# Patient Record
Sex: Male | Born: 1945
Health system: Southern US, Community
[De-identification: ages and names within clinical notes are randomized; demographics above are authoritative.]

## PROBLEM LIST (undated history)

## (undated) DIAGNOSIS — E785 Hyperlipidemia, unspecified: Secondary | ICD-10-CM

## (undated) DIAGNOSIS — Z923 Personal history of irradiation: Secondary | ICD-10-CM

## (undated) DIAGNOSIS — Z860100 Personal history of colon polyps, unspecified: Secondary | ICD-10-CM

## (undated) DIAGNOSIS — I2510 Atherosclerotic heart disease of native coronary artery without angina pectoris: Secondary | ICD-10-CM

## (undated) DIAGNOSIS — C4490 Unspecified malignant neoplasm of skin, unspecified: Secondary | ICD-10-CM

## (undated) DIAGNOSIS — E669 Obesity, unspecified: Secondary | ICD-10-CM

## (undated) DIAGNOSIS — I4510 Unspecified right bundle-branch block: Secondary | ICD-10-CM

## (undated) DIAGNOSIS — C61 Malignant neoplasm of prostate: Secondary | ICD-10-CM

## (undated) DIAGNOSIS — G629 Polyneuropathy, unspecified: Secondary | ICD-10-CM

## (undated) DIAGNOSIS — E113299 Type 2 diabetes mellitus with mild nonproliferative diabetic retinopathy without macular edema, unspecified eye: Secondary | ICD-10-CM

## (undated) DIAGNOSIS — J189 Pneumonia, unspecified organism: Secondary | ICD-10-CM

## (undated) DIAGNOSIS — C801 Malignant (primary) neoplasm, unspecified: Secondary | ICD-10-CM

## (undated) DIAGNOSIS — D649 Anemia, unspecified: Secondary | ICD-10-CM

## (undated) DIAGNOSIS — K5730 Diverticulosis of large intestine without perforation or abscess without bleeding: Secondary | ICD-10-CM

## (undated) DIAGNOSIS — I219 Acute myocardial infarction, unspecified: Secondary | ICD-10-CM

## (undated) DIAGNOSIS — I10 Essential (primary) hypertension: Secondary | ICD-10-CM

## (undated) DIAGNOSIS — E119 Type 2 diabetes mellitus without complications: Secondary | ICD-10-CM

## (undated) HISTORY — DX: Malignant neoplasm of prostate: C61

## (undated) HISTORY — PX: APPENDECTOMY: SHX54

## (undated) HISTORY — DX: Unspecified malignant neoplasm of skin, unspecified: C44.90

## (undated) HISTORY — PX: PARTIAL COLECTOMY: SHX5273

## (undated) HISTORY — DX: Acute myocardial infarction, unspecified: I21.9

## (undated) HISTORY — PX: CORONARY ANGIOPLASTY WITH STENT PLACEMENT: SHX49

## (undated) HISTORY — DX: Malignant (primary) neoplasm, unspecified: C80.1

## (undated) HISTORY — DX: Hyperlipidemia, unspecified: E78.5

## (undated) HISTORY — DX: Essential (primary) hypertension: I10

## (undated) HISTORY — PX: CARDIOVASCULAR STRESS TEST: SHX262

## (undated) HISTORY — DX: Obesity, unspecified: E66.9

---

## 2004-02-19 ENCOUNTER — Ambulatory Visit: Payer: Self-pay | Admitting: Internal Medicine

## 2004-03-11 ENCOUNTER — Ambulatory Visit: Payer: Self-pay | Admitting: Internal Medicine

## 2004-07-09 ENCOUNTER — Ambulatory Visit: Payer: Self-pay | Admitting: Internal Medicine

## 2004-07-16 ENCOUNTER — Ambulatory Visit: Payer: Self-pay | Admitting: Internal Medicine

## 2006-04-28 ENCOUNTER — Inpatient Hospital Stay (HOSPITAL_BASED_OUTPATIENT_CLINIC_OR_DEPARTMENT_OTHER): Admission: RE | Admit: 2006-04-28 | Discharge: 2006-04-28 | Payer: Self-pay | Admitting: Cardiology

## 2006-05-04 ENCOUNTER — Inpatient Hospital Stay (HOSPITAL_COMMUNITY): Admission: AD | Admit: 2006-05-04 | Discharge: 2006-05-05 | Payer: Self-pay | Admitting: Pediatrics

## 2006-05-26 ENCOUNTER — Encounter (HOSPITAL_COMMUNITY): Admission: RE | Admit: 2006-05-26 | Discharge: 2006-08-15 | Payer: Self-pay | Admitting: Cardiology

## 2009-05-12 ENCOUNTER — Encounter: Admission: RE | Admit: 2009-05-12 | Discharge: 2009-05-12 | Payer: Self-pay | Admitting: *Deleted

## 2009-05-23 ENCOUNTER — Encounter: Admission: RE | Admit: 2009-05-23 | Discharge: 2009-05-23 | Payer: Self-pay | Admitting: *Deleted

## 2009-09-23 ENCOUNTER — Ambulatory Visit: Payer: Self-pay | Admitting: Cardiology

## 2010-03-30 ENCOUNTER — Ambulatory Visit: Payer: Self-pay | Admitting: Cardiology

## 2010-06-08 ENCOUNTER — Other Ambulatory Visit: Payer: Self-pay | Admitting: Family Medicine

## 2010-06-08 ENCOUNTER — Ambulatory Visit
Admission: RE | Admit: 2010-06-08 | Discharge: 2010-06-08 | Disposition: A | Discharge status: Home / Self Care | Payer: Medicare Other | Source: Ambulatory Visit | Attending: Family Medicine | Admitting: Family Medicine

## 2010-06-08 DIAGNOSIS — R509 Fever, unspecified: Secondary | ICD-10-CM

## 2010-06-25 ENCOUNTER — Other Ambulatory Visit: Payer: Self-pay | Admitting: Cardiology

## 2010-06-25 NOTE — Telephone Encounter (Signed)
escribe medication per fax request  

## 2010-06-26 NOTE — Discharge Summary (Signed)
NAME:  FIELDS, OROS NO.:  [id number]   MEDICAL RECORD NO.:  [id number]          PATIENT TYPE:  INP   LOCATION:  6523                         FACILITY:  [facility]   PHYSICIAN:  [name]. Swaziland, M.D.  DATE OF BIRTH:  10-14-1945   DATE OF ADMISSION:  05/04/2006  DATE OF DISCHARGE:  05/05/2006                               DISCHARGE SUMMARY   HISTORY OF PRESENT ILLNESS:  Benjamin Barr is a 65 year old white male who  presented with _______.  He had an abnormal stress Cardiolite study who  subsequently underwent cardiac catheterization.  This demonstrated  severe 2 vessel coronary disease.  He had an 80% to 90% stenosis left  circumflex coronary artery bifurcation versus marginal vessel.  There  was also an 80% stenosis of the mid right coronary artery.  The patient  returned for percutaneous intervention of these lesions.  Further  details in the past medical history, social history, family history, and  physical examination.   LABORATORY DATA:  Refer to his dictated H&P.   HOSPITAL COURSE:  The patient was brought to the catheterization  laboratory.  He underwent a 2 vessel stenting.  He had a 2.5 x 24 mm  Taxus stent placed in the left circumflex, coronary artery extending  wrapped the proximal vessel and across the bifurcation of the first  marginal vessel.  An excellent angiograph result was obtained 0%  residual stenosis.  There was no compromise in the first marginal side  branch.  We also stented the mid right coronary artery using a 3.0 x 24  mm Taxus stent.  Again, an excellent angiograph result was obtained, it  was 0% residual stenosis.  The patient had no complications post  procedure.  His chemistries and CBC remained stable.  He had no growing  hematomas.  EKG was unchanged.  He had no chest pain.  He was maintained  on aspirin and Plavix.  He was discharged home the following day in  stable condition.   DISCHARGE DIAGNOSES:  1. Coronary artery disease,  significant 2 vessel disease now status      post successful stenting.  2. Diabetes mellitus type 2.  3. Hyperlipidemia.  4. Hypertension.  5. Obesity.   DISCHARGE MEDICATIONS:  1. Aspirin 325 mg daily for a month, then 81 mg per day.  2. Plavix 75 mg per day.  3. Glipizide/metformin 5/500 mg two tablets twice a day.  The patient      will need to hold this for 2 days given his diet load.  4. Lisinopril HCT 20/25 mg per day.  5. Januvia 100 mg daily.  6. Lipitor 40 mg at bedtime.   PLAN:  The patient is instructed to avoid heavy lifting or straining for  1 week.  He will followup with Dr. Swaziland in 1 to 2 weeks.   DISCHARGED STATUS:  Improved.           ______________________________  [name]. Swaziland, M.D.     PMJ/MEDQ  D:  05/05/2006  T:  05/05/2006  Job:  [id number]

## 2010-06-26 NOTE — Cardiovascular Report (Signed)
NAMEMarland Barr  TALLIE, HEVIA NO.:  [id number]   MEDICAL RECORD NO.:  [id number]          PATIENT TYPE:  OIB   LOCATION:  2807                         FACILITY:  [facility]   PHYSICIAN:  [name]. Swaziland, M.D.  DATE OF BIRTH:  1945/03/29   DATE OF PROCEDURE:  05/04/2006  DATE OF DISCHARGE:                            CARDIAC CATHETERIZATION   PROCEDURE:  Coronary intervention.   INDICATIONS FOR PROCEDURE:  A 65 year old white male who presents with  anginal symptoms.  He had an abnormal stress Cardiolite study.  Subsequent cardiac catheterization demonstrated severe two-vessel  obstructive coronary disease involving the left circumflex and right  coronary.  He returns for intervention today.   ACCESS:  The right femoral artery using standard Seldinger technique.   EQUIPMENT USED:  For the left circumflex coronary was a left Voda 3.5  guide, a 0.014 high-torque floppy extra support wire, a 0.014 high-  torque floppy wire, a 2.5 x 15-mm Quantum Maverick balloon, a 2.5 x 24  mm Taxus stent, and a 2.75 x 15-mm Quantum Maverick balloon.   For the right coronary we used a 6-French right Judkins 4 guide, a 0.014  high-torque floppy extra support wire, a 2.75 x 15-mm Quantum Maverick  balloon, a 3.0 x 24 mm Taxus stent, and a 3.25 x 20-mm Quantum Maverick  balloon.   MEDICATIONS:  Local anesthesia with 1% Xylocaine.  Versed 2 mg IV,  nitroglycerin 200 mcg intracoronary times one, Angiomax bolus at 0.75  mg/kg with then a continuous infusion of 1.75 mg/kg per hour.  Subsequent ACT was 316.   CONDITION:  The patient remained hemodynamically stable throughout the  procedure.   DESCRIPTION OF PROCEDURE:  The first lesion we addressed was the left  circumflex coronary artery.  There wa0s a 70% stenosis in the proximal  vessel followed by an 80-90% stenosis just prior to the bifurcation of  the first marginal branch.  We initially placed the extra support wire  down the left  circumflex proper.  The other high-torque floppy wire was  placed down the marginal branch.  We initially predilated the left  circumflex at the bifurcation and proximally using a 2.5 mm Maverick  balloon dilating up to 8 atmospheres distally and 12 atmospheres more  proximally.  The obtuse marginal vessel was then dilated at its ostium  up to 6 atmospheres to try to prevent any plaque shift.  We next placed  a 2.5 x 24 mm Taxus stent covering the first marginal branch and  covering the entire proximal circumflex.  This was deployed at 9  atmospheres and then 12 atmospheres with a stent balloon.  We post  dilated the distal portion the stent using the 2.5 of 15 mm Quantum  Maverick balloon up to 14 atmospheres.  The more proximal portion of the  stent we post dilated with 2.75 of 15 mm Quantum Maverick balloon  dilating up to 14 atmospheres.  This yielded an excellent angiographic  result with 0% residual stenosis and TIMI grade 3 flow.  There was no  compromise of the obtuse marginal vessel which remained  widely patent.   The second lesion we intervened on was the mid right coronary.  There  was a focal 80% stenosis followed by a 50% stenosis in the mid vessel.  Using the above equipment we predilated the area with a 2.75 of 15 mm  Quantum balloon to 12 atmospheres.  We then deployed a 3.0 x 24 mm Taxus  stent at 9 atmospheres and then 12 atmospheres with a stent balloon.  We  then post dilated the entire segment of the stent using a 3.25 x 20-mm  Quantum Maverick balloon up to 14 atmospheres.  This yielded an  excellent angiographic result with 0% residual stenosis and TIMI grade 3  flow.   FINAL INTERPRETATION:  1. Successful intracoronary stenting of the proximal to mid left      circumflex coronary.  2. Successful intracoronary stenting of the mid right coronary.           ______________________________  [name]. Swaziland, M.D.     PMJ/MEDQ  D:  05/04/2006  T:  05/04/2006   Job:  [id number]   cc:   Evelena Peat, M.D.

## 2010-06-26 NOTE — Cardiovascular Report (Signed)
NAME:  ROCK, SOBOL NO.:  [id number]   MEDICAL RECORD NO.:  [id number]          PATIENT TYPE:  OIB   LOCATION:  NA                           FACILITY:  [facility]   PHYSICIAN:  [name]. Swaziland, M.D.  DATE OF BIRTH:  March 15, 1945   DATE OF PROCEDURE:  04/28/2006  DATE OF DISCHARGE:                            CARDIAC CATHETERIZATION   INDICATIONS FOR PROCEDURE:  The patient is a 65 year old white male who  has had exertional chest pain and dyspnea.  He has a history of  hypercholesterolemia and diabetes mellitus, type 2.  He had an abnormal  stress Cardiolite study showing evidence of apical ischemia.   PROCEDURE:  Left heart catheterization with coronary and left  ventricular angiography.   EQUIPMENT USED:  A 4-French 4-cm left Judkins catheter, a 4-French 3-D  RC catheter, a 4-French pigtail catheter, a 4-French arterial sheath.   MEDICATIONS:  Local anesthesia __________ Xylocaine.   CONTRAST:  Omnipaque 400 cc.   HEMODYNAMIC DATA:  Aortic pressure is 148/70 with mean of 103.  The left  ventricle pressure was 149 with a EDP of 20-mmHg.   ANGIOGRAPHIC DATA:  The left coronary arises and distributes normally.  The left main coronary is moderately calcified without significant  disease.   The left anterior descending artery is also moderately calcified  throughout the proximal to mid vessel.  It has a 20% narrowing in the  proximal vessel as well as diffuse 20% narrowing distally.  The first  and second diagonal branches are relatively small and without disease.   The left circumflex coronary artery is moderately calcified from the  proximal to mid vessel.  There is an 80% stenosis in the proximal  vessel.  This was followed by an 80-90% stenosis just prior to the  bifurcation of the first and second obtuse marginal vessels.  The second  obtuse marginal vessel is actually a much larger branch than the first.   The right coronary artery arises and distributes  normally.  This is a  dominant vessel.  There is an 80-90% focal stenosis in the proximal to  mid vessel, followed by a 40% stenosis in the mid vessel.  The remainder  of the right coronary artery is without significant disease.  There is  approximately 30% stenosis in the posterolateral branch.   LEFT VENTRICULAR ANGIOGRAPHY:  Was performed in the RAO view.  This  demonstrates normal left ventricular size and contractility with normal  systolic function.  Ejection fraction is estimated at 65%.  There is no  segmental wall motion abnormalities.   FINAL INTERPRETATION:  1. Severe two-vessel obstructive atherosclerotic coronary artery      disease.  2. Normal left ventricular function.   PLAN:  Would recommend consideration for percutaneous coronary  intervention.           ______________________________  [name]. Swaziland, M.D.     PMJ/MEDQ  D:  04/28/2006  T:  04/28/2006  Job:  [id number]   cc:   Evelena Peat, M.D.

## 2010-06-26 NOTE — H&P (Signed)
NAME:  Benjamin Barr, Benjamin Barr NO.:  [id number]   MEDICAL RECORD NO.:  [id number]           PATIENT TYPE:   LOCATION:                                 FACILITY:   PHYSICIAN:  [name]. Swaziland, M.D.  DATE OF BIRTH:  Jun 20, 1945   DATE OF ADMISSION:  04/28/2006  DATE OF DISCHARGE:                              HISTORY & PHYSICAL   HISTORY OF PRESENT ILLNESS:  Mr. Hupp is a 65 year old white male who  is seen for evaluation of chest pain.  For the past several weeks he has  had a pain in his left upper chest with some radiation to his left arm,  and he complains that his left arm feels weak and numb.  He has also had  a shooting discomfort beneath his left breast.  Approximately 2 weeks  ago, he was [place] the [place] and became  extremely short of breath and lightheaded.  He does report several  months ago he had a very hard episode of chest pain that resolved.  He  does have multiple cardiac risk factors.  He subsequently was evaluated  with a stress Cardiolite study.  He was able to exercise for 9 minutes  on the Bruce protocol without any significant chest pain.  He did have  ST changes on ECG consistent with ischemia and his Cardiolite images  showed an area of mild apical ischemia.  Given these findings, it is  recommended he undergo cardiac catheterization.   PAST MEDICAL HISTORY:  1. Diabetes mellitus, type 2.  2. Hypercholesterolemia.  3. Obesity.   PRIOR SURGERIES:  1. Appendectomy.  2. Previous partial colectomy.   HE HAS NO KNOWN ALLERGIES.   CURRENT MEDICATIONS:  1. Glipizide/metformin 5/500 mg, two tablets b.i.d.  2. Lisinopril/HCT 20/25 mg per day.  3. Aspirin 81 mg per day.  4. Lipitor 40 mg per day.  5. Januvia 100 mg daily.   SOCIAL HISTORY:  The patient is retired.  He does walk regularly.  He  denies tobacco, drinks occasional alcohol.  He is married and has two  sons.   FAMILY HISTORY:  Father died age 62 with  congestive heart failure.  Mother died at age 83 with complication of diverticulitis.  He has no  siblings.   REVIEW OF SYSTEMS:  Otherwise unremarkable.   PHYSICAL EXAMINATION:  GENERAL:  The patient is an obese white male in  no distress.  VITAL SIGNS:  Weight is 209, blood pressure 138/78, pulse is 80 and  regular.  NECK:  He has no JVD, adenopathy, thyromegaly or bruits.  HEENT:  Unremarkable.  LUNGS:  Clear to auscultation percussion.  CARDIAC:  Reveals a regular rate and rhythm with a soft 1/6 systolic  murmur in the left upper sternal border radiating to the apex.  There is  no S3.  ABDOMEN:  Soft, nontender without masses or bruits.  EXTREMITIES:  Without edema.  Pulses are 2+ and symmetric.  NEUROLOGIC:  Nonfocal.   LABORATORY DATA:  ECG shows normal sinus rhythm with left axis deviation  and right  bundle branch block.  Chest x-ray shows poor inspiration with  low lung volumes, otherwise no active disease.   IMPRESSION:  1. Abnormal stress Cardiolite study showing evidence of apical      ischemia.  2. Chest pain consistent with angina.  3. Diabetes mellitus, type 2.  4. Hypertension.  5. Hypercholesterolemia.  6. Obesity.   PLAN:  Proceed with diagnostic cardiac catheterization with further  therapy pending these results.           ______________________________  [name]. Swaziland, M.D.     PMJ/MEDQ  D:  04/25/2006  T:  04/25/2006  Job:  [id number]   cc:   Evelena Peat, M.D.

## 2010-07-08 ENCOUNTER — Ambulatory Visit
Admission: RE | Admit: 2010-07-08 | Discharge: 2010-07-08 | Disposition: A | Discharge status: Home / Self Care | Payer: Medicare Other | Source: Ambulatory Visit | Attending: Family Medicine | Admitting: Family Medicine

## 2010-07-08 ENCOUNTER — Other Ambulatory Visit: Payer: Self-pay | Admitting: Family Medicine

## 2010-07-08 DIAGNOSIS — J189 Pneumonia, unspecified organism: Secondary | ICD-10-CM

## 2010-12-25 ENCOUNTER — Encounter: Payer: Self-pay | Admitting: Cardiology

## 2010-12-28 ENCOUNTER — Ambulatory Visit (INDEPENDENT_AMBULATORY_CARE_PROVIDER_SITE_OTHER): Payer: Medicare Other | Admitting: Cardiology

## 2010-12-28 ENCOUNTER — Encounter: Payer: Self-pay | Admitting: Cardiology

## 2010-12-28 VITALS — BP 123/78 | HR 102 | Ht 68.0 in | Wt 208.0 lb

## 2010-12-28 DIAGNOSIS — E1310 Other specified diabetes mellitus with ketoacidosis without coma: Secondary | ICD-10-CM

## 2010-12-28 DIAGNOSIS — E669 Obesity, unspecified: Secondary | ICD-10-CM | POA: Insufficient documentation

## 2010-12-28 DIAGNOSIS — E1110 Type 2 diabetes mellitus with ketoacidosis without coma: Secondary | ICD-10-CM

## 2010-12-28 DIAGNOSIS — I2510 Atherosclerotic heart disease of native coronary artery without angina pectoris: Secondary | ICD-10-CM

## 2010-12-28 DIAGNOSIS — E1140 Type 2 diabetes mellitus with diabetic neuropathy, unspecified: Secondary | ICD-10-CM | POA: Insufficient documentation

## 2010-12-28 DIAGNOSIS — I10 Essential (primary) hypertension: Secondary | ICD-10-CM

## 2010-12-28 DIAGNOSIS — E785 Hyperlipidemia, unspecified: Secondary | ICD-10-CM

## 2010-12-28 DIAGNOSIS — E119 Type 2 diabetes mellitus without complications: Secondary | ICD-10-CM

## 2010-12-28 MED ORDER — CLOPIDOGREL BISULFATE 75 MG PO TABS: 75.0000 mg | ORAL_TABLET | Freq: Every day | ORAL | Status: DC

## 2010-12-28 NOTE — Assessment & Plan Note (Signed)
He will continue on his current lipid-lowering therapy. He needs to do better with his diet including weight loss. He needs to increase his aerobic activity.

## 2010-12-28 NOTE — Patient Instructions (Signed)
You need to increase your aerobic activity.   We will schedule you for a nuclear stress test.  Work on getting your diabetes under control as outlined by Dr. Sharl Ma.  If your stress test is ok I will see you again in 1 year.

## 2010-12-28 NOTE — Assessment & Plan Note (Signed)
He is having no significant anginal symptoms. His last nuclear stress test was in February of 2009. I recommended that we do a followup stress test at his convenience. He would like to wait until after the new year and so we'll plan on scheduling it in January.

## 2010-12-28 NOTE — Assessment & Plan Note (Signed)
Well-controlled on current medications 

## 2010-12-28 NOTE — Progress Notes (Signed)
[name] Date of Birth: 02-10-1945 Medical Record #[id number]  History of Present Illness: Benjamin Barr is seen today for followup of his coronary disease. He was last seen in August of 2011. He reports that he has been doing well from a cardiac standpoint without any significant chest pain. On one occasion he had some mild tingling in his chest. He has been under increased stress recently and caring for his mother-in-law. His diabetes has not been well controlled recently and his insulin dose was increased today. He admits that he hasn't gotten any exercise in the last 1-2 months.  Current Outpatient Prescriptions on File Prior to Visit  Medication Sig Dispense Refill  . aspirin 325 MG tablet Take 325 mg by mouth daily.        . Cholecalciferol (VITAMIN D PO) Take by mouth daily.        [name] glipiZIDE (GLUCOTROL) 10 MG tablet Take 10 mg by mouth 2 (two) times daily before a meal.        . hydrochlorothiazide (HYDRODIURIL) 25 MG tablet Take 25 mg by mouth daily.        . insulin glargine (LANTUS) 100 UNIT/ML injection Inject into the skin at bedtime.        [name] lisinopril (PRINIVIL,ZESTRIL) 20 MG tablet Take 20 mg by mouth daily.        . metFORMIN (GLUCOPHAGE) 1000 MG tablet Take 1,000 mg by mouth 2 (two) times daily with a meal.        . simvastatin (ZOCOR) 80 MG tablet Take 80 mg by mouth at bedtime.        [name] DISCONTD: PLAVIX 75 MG tablet TAKE ONE TABLET BY MOUTH DAILY  30 tablet  5    No Known Allergies  Past Medical History  Diagnosis Date  . Neuropathy   . Coronary artery disease     stent of RCA, LCX  with DES 2008  . Hypertension   . Diabetes mellitus   . Hyperlipidemia   . Obesity     Past Surgical History  Procedure Date  . Cardiac catheterization 05/04/2006    STENTING OF THE RIGHT CORONARY AND LEFT CIRCUMFLEX CORONARY WITH DES  . Appendectomy   . Partial colectomy   . Cardiovascular stress test 03/24/2007    EF 55%. NORMAL    History  Smoking status  . Never Smoker     Smokeless tobacco  . Not on file    History  Alcohol Use No    Family History  Problem Relation Age of Onset  . Diverticulitis Mother   . Heart failure Father     Review of Systems: As noted in history of present illness..  All other systems were reviewed and are negative.  Physical Exam: BP 123/78  Pulse 102  Ht 5\' 8"  (1.727 m)  Wt 208 lb (94.348 kg)  BMI 31.63 kg/m2 He is an obese white male in no acute distress.The patient is alert and oriented x 3.  The mood and affect are normal.  The skin is warm and dry.  Color is normal.  The HEENT exam reveals that the sclera are nonicteric.  The mucous membranes are moist.  The carotids are 2+ without bruits.  There is no thyromegaly.  There is no JVD.  The lungs are clear.  The chest wall is non tender.  The heart exam reveals a regular rate with a normal S1 and S2.  There are no murmurs, gallops, or rubs.  The PMI is  not displaced.   Abdominal exam reveals good bowel sounds.  There is no guarding or rebound.  There is no hepatosplenomegaly or tenderness.  There are no masses.  Exam of the legs reveal no clubbing, cyanosis, or edema.  The legs are without rashes.  The distal pulses are intact.  Cranial nerves II - XII are intact.  Motor and sensory functions are intact.  The gait is normal.  LABORATORY DATA: ECG today demonstrates normal sinus rhythm with a rate of 100 beats per minute. He has a left axis deviation and right bundle branch block. This is unchanged from 2008.  Assessment / Plan:

## 2011-01-15 ENCOUNTER — Ambulatory Visit: Payer: Medicare Other | Admitting: Cardiology

## 2011-02-17 ENCOUNTER — Encounter (HOSPITAL_COMMUNITY): Payer: Medicare Other | Admitting: Radiology

## 2012-01-12 ENCOUNTER — Ambulatory Visit (INDEPENDENT_AMBULATORY_CARE_PROVIDER_SITE_OTHER): Payer: Medicare Other | Admitting: Cardiology

## 2012-01-12 ENCOUNTER — Encounter: Payer: Self-pay | Admitting: Cardiology

## 2012-01-12 VITALS — BP 140/78 | HR 77 | Ht 68.0 in | Wt 212.6 lb

## 2012-01-12 DIAGNOSIS — E1165 Type 2 diabetes mellitus with hyperglycemia: Secondary | ICD-10-CM

## 2012-01-12 DIAGNOSIS — E785 Hyperlipidemia, unspecified: Secondary | ICD-10-CM

## 2012-01-12 DIAGNOSIS — I10 Essential (primary) hypertension: Secondary | ICD-10-CM

## 2012-01-12 DIAGNOSIS — I2510 Atherosclerotic heart disease of native coronary artery without angina pectoris: Secondary | ICD-10-CM

## 2012-01-12 DIAGNOSIS — IMO0001 Reserved for inherently not codable concepts without codable children: Secondary | ICD-10-CM

## 2012-01-12 NOTE — Progress Notes (Signed)
[name] Date of Birth: May 14, 1945 Medical Record #[id number]  History of Present Illness: Benjamin Barr is seen today for followup of his coronary disease. He is status post stenting of the right coronary and the left circumflex coronary with drug-eluting stents in 2008. His last stress test in February 2009 was normal. On his visit one year ago we had discussed stress testing but he never followed through. He reports that he is doing well. He is playing golf 2 days a week. He denies any significant chest pain or shortness of breath. He states he has a new dog and is planning on walking more. He is frustrated with his diabetes. He has gained 4 pounds. His insulin dose was increased.  Current Outpatient Prescriptions on File Prior to Visit  Medication Sig Dispense Refill  . aspirin 325 MG tablet Take 325 mg by mouth daily.        . Cholecalciferol (VITAMIN D PO) Take by mouth daily.        . clopidogrel (PLAVIX) 75 MG tablet Take 1 tablet (75 mg total) by mouth daily.  90 tablet  5  . glipiZIDE (GLUCOTROL) 10 MG tablet Take 10 mg by mouth 2 (two) times daily before a meal.        . hydrochlorothiazide (HYDRODIURIL) 25 MG tablet Take 25 mg by mouth daily.        . insulin glargine (LANTUS) 100 UNIT/ML injection Inject into the skin at bedtime.        [name] lisinopril (PRINIVIL,ZESTRIL) 20 MG tablet Take 20 mg by mouth daily.        . metFORMIN (GLUCOPHAGE) 1000 MG tablet Take 1,000 mg by mouth 2 (two) times daily with a meal.        . simvastatin (ZOCOR) 80 MG tablet Take 80 mg by mouth at bedtime.          No Known Allergies  Past Medical History  Diagnosis Date  . Neuropathy   . Coronary artery disease     stent of RCA, LCX  with DES 2008  . Hypertension   . Diabetes mellitus   . Hyperlipidemia   . Obesity     Past Surgical History  Procedure Date  . Cardiac catheterization 05/04/2006    STENTING OF THE RIGHT CORONARY AND LEFT CIRCUMFLEX CORONARY WITH DES  . Appendectomy   . Partial  colectomy   . Cardiovascular stress test 03/24/2007    EF 55%. NORMAL    History  Smoking status  . Never Smoker   Smokeless tobacco  . Not on file    History  Alcohol Use No    Family History  Problem Relation Age of Onset  . Diverticulitis Mother   . Heart failure Father     Review of Systems: As noted in history of present illness..  All other systems were reviewed and are negative.  Physical Exam: BP 140/78  Pulse 77  Ht 5\' 8"  (1.727 m)  Wt 212 lb 9.6 oz (96.435 kg)  BMI 32.33 kg/m2  SpO2 96% He is an obese white male in no acute distress.The patient is alert and oriented x 3.   The skin is warm and dry.  The HEENT exam is normal. The carotids are 2+ without bruits.  There is no thyromegaly.  There is no JVD.  The lungs are clear.   The heart exam reveals a regular rate with a normal S1 and S2.  There are no murmurs, gallops, or rubs.  The  PMI is not displaced.   Abdominal exam reveals good bowel sounds.  There is no guarding or rebound.  There is no hepatosplenomegaly or tenderness.  There are no masses.  Exam of the legs reveal no clubbing, cyanosis, or edema.  The legs are without rashes.  The distal pulses are intact.  Cranial nerves II - XII are intact.  Motor and sensory functions are intact.  The gait is normal.  LABORATORY DATA: ECG today demonstrates normal sinus rhythm with a rate of 73 beats per minute. He has a chronic right bundle branch block.  Assessment / Plan: 1. Coronary disease status post stenting of the left circumflex and right coronaries in 2008. He remains asymptomatic. On his initial presentation he did not have typical chest pain. We discussed the rationale for followup stress testing and he is agreeable to proceed with a followup stress test at this point.  2. Hypertension, controlled.  3. Hyperlipidemia. Continue Zocor.  4. Diabetes mellitus type 2, insulin requiring.

## 2012-01-12 NOTE — Patient Instructions (Signed)
You may reduce your ASA to 81 mg daily  Continue your other therapy.  We will schedule you for a nuclear stress test.

## 2012-01-17 ENCOUNTER — Ambulatory Visit (HOSPITAL_COMMUNITY): Payer: Medicare Other | Attending: Cardiology | Admitting: Radiology

## 2012-01-17 VITALS — BP 151/76 | HR 65 | Ht 68.0 in | Wt 207.0 lb

## 2012-01-17 DIAGNOSIS — I10 Essential (primary) hypertension: Secondary | ICD-10-CM | POA: Insufficient documentation

## 2012-01-17 DIAGNOSIS — I2510 Atherosclerotic heart disease of native coronary artery without angina pectoris: Secondary | ICD-10-CM | POA: Insufficient documentation

## 2012-01-17 DIAGNOSIS — E785 Hyperlipidemia, unspecified: Secondary | ICD-10-CM

## 2012-01-17 DIAGNOSIS — E119 Type 2 diabetes mellitus without complications: Secondary | ICD-10-CM | POA: Insufficient documentation

## 2012-01-17 DIAGNOSIS — Z794 Long term (current) use of insulin: Secondary | ICD-10-CM | POA: Insufficient documentation

## 2012-01-17 DIAGNOSIS — I4510 Unspecified right bundle-branch block: Secondary | ICD-10-CM | POA: Insufficient documentation

## 2012-01-17 DIAGNOSIS — E1165 Type 2 diabetes mellitus with hyperglycemia: Secondary | ICD-10-CM

## 2012-01-17 MED ORDER — TECHNETIUM TC 99M SESTAMIBI GENERIC - CARDIOLITE
30.0000 | Freq: Once | INTRAVENOUS | Status: AC | PRN
  Administered 2012-01-17: 30 via INTRAVENOUS

## 2012-01-17 MED ORDER — TECHNETIUM TC 99M SESTAMIBI GENERIC - CARDIOLITE
10.0000 | Freq: Once | INTRAVENOUS | Status: AC | PRN
  Administered 2012-01-17: 10 via INTRAVENOUS

## 2012-01-17 NOTE — Progress Notes (Signed)
[facility] SITE 3 NUCLEAR MED [id number] [place] [address] [phone]  Cardiology Nuclear Med Study  Benjamin Barr is a 66 y.o. male     MRN : [id number]     DOB: [birth year]  Procedure Date: 01/17/2012  Nuclear Med Background Indication for Stress Test:  Evaluation for Ischemia and Stent Patency History:  '08 Stent-RCA/LCX, EF=65%; '09 KGM:WNUUVO, EF=55% Cardiac Risk Factors: Hypertension, IDDM Type 2, Lipids and RBBB  Symptoms:  No cardiac complaints.   Nuclear Pre-Procedure Caffeine/Decaff Intake:  None > 12 hrs NPO After: 8:00pm   Lungs:  Clear. O2 Sat: 95% on room air. IV 0.9% NS with Angio Cath:  22g  IV Site: R Antecubital x 1, tolerated well IV Started by:  Irean Hong, RN  Chest Size (in):  46 Cup Size: n/a  Height: 5\' 8"  (1.727 m)  Weight:  207 lb (93.895 kg)  BMI:  Body mass index is 31.47 kg/(m^2). Tech Comments:  Full Lantus insulin dose last night, FBS was 82 @7 :00 am per patient. No medication today    Nuclear Med Study 1 or 2 day study: 1 day  Stress Test Type:  Stress  Reading MD: Dietrich Pates, MD  Order Authorizing Provider:  Peter Swaziland, MD  Resting Radionuclide: Technetium 49m Sestamibi  Resting Radionuclide Dose: 10.4 mCi   Stress Radionuclide:  Technetium 43m Sestamibi  Stress Radionuclide Dose: 32.6 mCi           Stress Protocol Rest HR: 65 Stress HR: 146  Rest BP: 151/76 Stress BP: 234/79  Exercise Time (min): 7:01 METS: 8.5   Predicted Max HR: 152 bpm % Max HR: 96.05 bpm Rate Pressure Product: 53664   Dose of Adenosine (mg):  n/a Dose of Lexiscan: n/a mg  Dose of Atropine (mg): n/a Dose of Dobutamine: n/a mcg/kg/min (at max HR)  Stress Test Technologist: Smiley Houseman, CMA-N  Nuclear Technologist:  Domenic Polite, CNMT     Rest Procedure:  Myocardial perfusion imaging was performed at rest 45 minutes following the intravenous administration of Technetium 62m Sestamibi.  Rest ECG: NSR.   RBBB.  Stress Procedure:  The patient exercised on the treadmill utilizing the Bruce protocol for 7:01 minutes. He then stopped due to a hypertensive response, but denied any chest pain.  Technetium 49m  Sestamibi was injected at peak exercise and myocardial perfusion imaging was performed after a brief delay.  Stress ECG: No significant change from baseline ECG  QPS Raw Data Images:  Images were motion corrected.  Soft tissue (diaphragm) Stress Images:  Normal homogeneous uptake in all areas of the myocardium. Rest Images:  Normal homogeneous uptake in all areas of the myocardium. Subtraction (SDS):  Normal Transient Ischemic Dilatation (Normal <1.22):  1.01 Lung/Heart Ratio (Normal <0.45):  0.28  Quantitative Gated Spect Images QGS EDV:  84 ml QGS ESV:  38 ml  Impression Exercise Capacity:  Good exercise capacity. BP Response:  Hypertensive blood pressure response. Clinical Symptoms:  No chest pain. ECG Impression:  No significant ST segment change suggestive of ischemia. Comparison with Prior Nuclear Study: No significatn change from previous report.  Overall Impression:  Normal stress nuclear study.  LV Ejection Fraction: 54%.  LV Wall Motion:  NL LV Function; NL Wall Motion   Dietrich Pates

## 2012-04-07 ENCOUNTER — Other Ambulatory Visit: Payer: Self-pay

## 2012-04-07 ENCOUNTER — Telehealth: Payer: Self-pay | Admitting: Cardiology

## 2012-04-07 MED ORDER — CLOPIDOGREL BISULFATE 75 MG PO TABS: 75.0000 mg | ORAL_TABLET | Freq: Every day | ORAL | Status: DC

## 2012-04-07 NOTE — Telephone Encounter (Signed)
Refill Request   Clopidogrel 75 mg to [facility] in [place] [phone] (phone number)

## 2012-04-12 ENCOUNTER — Telehealth: Payer: Self-pay

## 2012-04-12 NOTE — Telephone Encounter (Signed)
Called patient and he stated he has already picked up medication (Plavix)

## 2012-12-22 ENCOUNTER — Telehealth: Payer: Self-pay | Admitting: Cardiology

## 2012-12-25 NOTE — Telephone Encounter (Signed)
Received request from Nurse fax box, documents faxed for surgical clearance. °To: [facility]  °Fax number: [phone] °Attention: °

## 2013-01-15 ENCOUNTER — Ambulatory Visit (INDEPENDENT_AMBULATORY_CARE_PROVIDER_SITE_OTHER): Payer: Medicare Other | Admitting: Cardiology

## 2013-01-15 ENCOUNTER — Encounter: Payer: Self-pay | Admitting: Cardiology

## 2013-01-15 ENCOUNTER — Encounter (INDEPENDENT_AMBULATORY_CARE_PROVIDER_SITE_OTHER): Payer: Self-pay

## 2013-01-15 VITALS — BP 140/77 | HR 87 | Ht 68.0 in | Wt 201.0 lb

## 2013-01-15 DIAGNOSIS — E785 Hyperlipidemia, unspecified: Secondary | ICD-10-CM

## 2013-01-15 DIAGNOSIS — I10 Essential (primary) hypertension: Secondary | ICD-10-CM

## 2013-01-15 DIAGNOSIS — I2510 Atherosclerotic heart disease of native coronary artery without angina pectoris: Secondary | ICD-10-CM

## 2013-01-15 NOTE — Patient Instructions (Signed)
Continue your current therapy  I will see you in one year   

## 2013-01-15 NOTE — Progress Notes (Signed)
Benjamin Barr Date of Birth: 09-17-1945 Medical Record #[id number]  History of Present Illness: Benjamin Barr is seen today for followup of his coronary disease. He is status post stenting of the right coronary and the left circumflex coronary with Taxus drug-eluting stents in 2008. His last stress test in Dec 2013 was normal. EF 54%. He reports that he is doing well. He is playing golf regularly. He denies any significant chest pain or shortness of breath. He was started on Victoza for his diabetes with significant improvement in blood sugars and weight loss of 11 lbs.  Current Outpatient Prescriptions on File Prior to Visit  Medication Sig Dispense Refill  . aspirin 325 MG tablet Take 325 mg by mouth daily.        . Cholecalciferol (VITAMIN D PO) Take by mouth daily.        . clopidogrel (PLAVIX) 75 MG tablet Take 1 tablet (75 mg total) by mouth daily.  90 tablet  3  . hydrochlorothiazide (HYDRODIURIL) 25 MG tablet Take 25 mg by mouth daily.        . insulin glargine (LANTUS) 100 UNIT/ML injection Inject into the skin at bedtime.        [name] lisinopril (PRINIVIL,ZESTRIL) 20 MG tablet Take 20 mg by mouth daily.        . metFORMIN (GLUCOPHAGE) 1000 MG tablet Take 1,000 mg by mouth 2 (two) times daily with a meal.        . simvastatin (ZOCOR) 80 MG tablet Take 80 mg by mouth at bedtime.         No current facility-administered medications on file prior to visit.    No Known Allergies  Past Medical History  Diagnosis Date  . Neuropathy   . Coronary artery disease     stent of RCA, LCX  with DES 2008  . Hypertension   . Diabetes mellitus   . Hyperlipidemia   . Obesity     Past Surgical History  Procedure Laterality Date  . Cardiac catheterization  05/04/2006    STENTING OF THE RIGHT CORONARY AND LEFT CIRCUMFLEX CORONARY WITH DES  . Appendectomy    . Partial colectomy    . Cardiovascular stress test  03/24/2007    EF 55%. NORMAL    History  Smoking status  . Never Smoker   Smokeless  tobacco  . Not on file    History  Alcohol Use No    Family History  Problem Relation Age of Onset  . Diverticulitis Mother   . Heart failure Father     Review of Systems: As noted in history of present illness..  All other systems were reviewed and are negative.  Physical Exam: BP 140/77  Pulse 87  Ht 5\' 8"  (1.727 m)  Wt 201 lb (91.173 kg)  BMI 30.57 kg/m2 He is an obese white male in no acute distress.The skin is warm and dry.  The HEENT exam is normal. The carotids are 2+ without bruits.  There is no thyromegaly.  There is no JVD.  The lungs are clear.   The heart exam reveals a regular rate with a normal S1 and S2.  There are no murmurs, gallops, or rubs.  The PMI is not displaced.   Abdominal exam reveals good bowel sounds.  There is no guarding or rebound.  There is no hepatosplenomegaly or tenderness.    Exam of the legs reveal no clubbing, cyanosis, or edema.   The distal pulses are intact.  Cranial nerves  II - XII are intact.  Motor and sensory functions are intact.  The gait is normal.  LABORATORY DATA: ECG today demonstrates normal sinus rhythm with a rate of 78 beats per minute. He has a chronic right bundle branch block.  Assessment / Plan: 1. Coronary disease status post stenting of the left circumflex and right coronaries in 2008. He remains asymptomatic. Normal myoview in 12/13. Continue current medical Rx.  2. Hypertension, controlled.  3. Hyperlipidemia. Continue Zocor.  4. Diabetes mellitus type 2, improved on Victoza.

## 2013-04-10 ENCOUNTER — Other Ambulatory Visit: Payer: Self-pay | Admitting: *Deleted

## 2013-04-10 MED ORDER — CLOPIDOGREL BISULFATE 75 MG PO TABS: 75.0000 mg | ORAL_TABLET | Freq: Every day | ORAL | Status: DC

## 2013-09-18 ENCOUNTER — Telehealth: Payer: Self-pay | Admitting: Cardiology

## 2013-09-18 NOTE — Telephone Encounter (Signed)
Returned call to patient Dr.Jordan out of office this week.Spoke to DOD Dr.Hochrein he advised ok to hold Plavix 5 days prior to prostate biopsy.Note faxed to Dr.Tannebauem at fax # [phone].

## 2013-09-18 NOTE — Telephone Encounter (Signed)
New Prob    Pt will be having a biopsy of his prostate scheduled for 8/13. Pt states he has already stopped taking his Plavis. He is calling to make sure this is OK. Please call.

## 2013-09-26 ENCOUNTER — Ambulatory Visit (INDEPENDENT_AMBULATORY_CARE_PROVIDER_SITE_OTHER): Payer: Medicare Other | Admitting: Surgery

## 2013-10-19 ENCOUNTER — Other Ambulatory Visit: Payer: Self-pay | Admitting: Cardiology

## 2014-01-18 ENCOUNTER — Encounter: Payer: Self-pay | Admitting: Cardiology

## 2014-01-18 ENCOUNTER — Ambulatory Visit (INDEPENDENT_AMBULATORY_CARE_PROVIDER_SITE_OTHER): Payer: Medicare Other | Admitting: Cardiology

## 2014-01-18 VITALS — BP 120/80 | HR 77 | Ht 68.0 in | Wt 207.5 lb

## 2014-01-18 DIAGNOSIS — I10 Essential (primary) hypertension: Secondary | ICD-10-CM

## 2014-01-18 DIAGNOSIS — I2510 Atherosclerotic heart disease of native coronary artery without angina pectoris: Secondary | ICD-10-CM

## 2014-01-18 DIAGNOSIS — IMO0002 Reserved for concepts with insufficient information to code with codable children: Secondary | ICD-10-CM

## 2014-01-18 DIAGNOSIS — E1165 Type 2 diabetes mellitus with hyperglycemia: Secondary | ICD-10-CM

## 2014-01-18 DIAGNOSIS — E785 Hyperlipidemia, unspecified: Secondary | ICD-10-CM

## 2014-01-18 MED ORDER — CLOPIDOGREL BISULFATE 75 MG PO TABS: 75.0000 mg | ORAL_TABLET | Freq: Every day | ORAL | Status: DC

## 2014-01-18 NOTE — Progress Notes (Signed)
Benjamin Barr Date of Birth: 08/02/1945 Medical Record #[id number]  History of Present Illness: [name] is seen today for followup of his coronary disease. He is status post stenting of the right coronary and the left circumflex coronary with Taxus drug-eluting stents in 2008. His last stress test in Dec 2013 was normal. EF 54%. He reports that he is doing well from a cardiac standpoint. . He denies any significant chest pain or shortness of breath. He is gaining weight and is sedentary. He has been diagnosed with prostate CA and is considering cryotherapy.   Current Outpatient Prescriptions on File Prior to Visit  Medication Sig Dispense Refill  . aspirin 325 MG tablet Take 325 mg by mouth daily.      . Cholecalciferol (VITAMIN D PO) Take by mouth daily.      . hydrochlorothiazide (HYDRODIURIL) 25 MG tablet Take 25 mg by mouth daily.      . insulin glargine (LANTUS) 100 UNIT/ML injection Inject 100 Units into the skin at bedtime.     [name] lisinopril (PRINIVIL,ZESTRIL) 20 MG tablet Take 20 mg by mouth daily.      . metFORMIN (GLUCOPHAGE) 1000 MG tablet Take 1,000 mg by mouth 2 (two) times daily with a meal.      . VICTOZA 18 MG/3ML SOPN 100 Units.     No current facility-administered medications on file prior to visit.    No Known Allergies  Past Medical History  Diagnosis Date  . Neuropathy   . Coronary artery disease     stent of RCA, LCX  with DES 2008  . Hypertension   . Diabetes mellitus   . Hyperlipidemia   . Obesity   . Prostate cancer     Past Surgical History  Procedure Laterality Date  . Cardiac catheterization  05/04/2006    STENTING OF THE RIGHT CORONARY AND LEFT CIRCUMFLEX CORONARY WITH DES  . Appendectomy    . Partial colectomy    . Cardiovascular stress test  03/24/2007    EF 55%. NORMAL    History  Smoking status  . Never Smoker   Smokeless tobacco  . Not on file    History  Alcohol Use No    Family History  Problem Relation Age of Onset  .  Diverticulitis Mother   . Heart failure Father     Review of Systems: As noted in history of present illness..  All other systems were reviewed and are negative.  Physical Exam: BP 120/80 mmHg  Pulse 77  Ht 5\' 8"  (1.727 m)  Wt 207 lb 8 oz (94.121 kg)  BMI 31.56 kg/m2 He is an obese white male in no acute distress.The skin is warm and dry.  The HEENT exam is normal. The carotids are 2+ without bruits.  There is no thyromegaly.  There is no JVD.  The lungs are clear.   The heart exam reveals a regular rate with a normal S1 and S2.  There are no murmurs, gallops, or rubs.  The PMI is not displaced.   Abdominal exam reveals good bowel sounds.  There is no guarding or rebound.  There is no hepatosplenomegaly or tenderness.    Exam of the legs reveal no clubbing, cyanosis, or edema.   The distal pulses are intact.  Cranial nerves II - XII are intact.  Motor and sensory functions are intact.  The gait is normal.  LABORATORY DATA: ECG today demonstrates normal sinus rhythm with occasional PVC. He has a chronic right bundle  branch block. I have personally reviewed and interpreted this study.   Assessment / Plan: 1. Coronary disease status post stenting of the left circumflex and right coronaries in 2008. He remains asymptomatic. Normal myoview in 12/13. Continue current medical Rx. Needs to increase his aerobic activity and lose weight.  2. Hypertension, controlled.  3. Hyperlipidemia. Continue Zocor.  4. Diabetes mellitus type 2, improved on Victoza.  5. Prostate CA

## 2014-01-18 NOTE — Patient Instructions (Signed)
Continue your current therapy  You need to start exercising again  Lose weight.  I will see you in one year.

## 2014-03-07 ENCOUNTER — Telehealth: Payer: Self-pay | Admitting: Cardiology

## 2014-03-07 NOTE — Telephone Encounter (Signed)
Returned call to [facility] with Dr.Tannenbaum left message on personal voice mail Dr.Jordan cleared patient for surgery.Advised he may hold Plavix 7 days prior, but should take aspirin 81 mg daily.Note faxed to [facility] at fax # [phone].Spoke to patient Dr.Jordan's advice was given.Patient understood.

## 2014-03-07 NOTE — Telephone Encounter (Signed)
He is cleared for cryoablation of the prostate from a cardiac standpoint. He may hold Plavix for 7 days prior but I would not stop ASA. He may reduce ASA to 81 mg daily  Minervia Osso [name] MD, [facility]

## 2014-03-07 NOTE — Telephone Encounter (Signed)
Request for surgical clearance:  1. What type of surgery is being performed? Cryo Ablasion of the prostate  2. When is this surgery scheduled? Not scheduled due to needing surg clearance  3. Are there any medications that need to be held prior to surgery and how long? Plavics held for 7 days prior and Asprin 5 days prior  4. Name of physician performing surgery? Dr. Gaynelle Arabian  5. What is your office phone and fax number? [phone]  Fax [phone].

## 2014-03-07 NOTE — Telephone Encounter (Signed)
Message sent to Dr.Jordan for advice. 

## 2014-03-08 ENCOUNTER — Other Ambulatory Visit: Payer: Self-pay | Admitting: Urology

## 2014-03-25 ENCOUNTER — Encounter (HOSPITAL_BASED_OUTPATIENT_CLINIC_OR_DEPARTMENT_OTHER): Payer: Self-pay | Admitting: *Deleted

## 2014-03-26 ENCOUNTER — Encounter (HOSPITAL_BASED_OUTPATIENT_CLINIC_OR_DEPARTMENT_OTHER): Payer: Self-pay | Admitting: *Deleted

## 2014-03-26 NOTE — Progress Notes (Signed)
Pt instructed npo p mn 2/21.  Pt to take 1/2 lantus dose at hs. Follow bowel prep per office instructions.  To [facility] 2/22 @ 0915.  Needs istat on arrival

## 2014-04-01 ENCOUNTER — Encounter (HOSPITAL_BASED_OUTPATIENT_CLINIC_OR_DEPARTMENT_OTHER)
Admission: RE | Disposition: A | Discharge status: Home / Self Care | Payer: Self-pay | Source: Ambulatory Visit | Attending: Urology

## 2014-04-01 ENCOUNTER — Ambulatory Visit (HOSPITAL_BASED_OUTPATIENT_CLINIC_OR_DEPARTMENT_OTHER): Payer: Medicare Other | Admitting: Anesthesiology

## 2014-04-01 ENCOUNTER — Ambulatory Visit (HOSPITAL_BASED_OUTPATIENT_CLINIC_OR_DEPARTMENT_OTHER)
Admission: RE | Admit: 2014-04-01 | Discharge: 2014-04-01 | Disposition: A | Discharge status: Home / Self Care | Payer: Medicare Other | Source: Ambulatory Visit | Attending: Urology | Admitting: Urology

## 2014-04-01 ENCOUNTER — Encounter (HOSPITAL_BASED_OUTPATIENT_CLINIC_OR_DEPARTMENT_OTHER): Payer: Self-pay | Admitting: Anesthesiology

## 2014-04-01 DIAGNOSIS — I2510 Atherosclerotic heart disease of native coronary artery without angina pectoris: Secondary | ICD-10-CM | POA: Insufficient documentation

## 2014-04-01 DIAGNOSIS — C61 Malignant neoplasm of prostate: Secondary | ICD-10-CM

## 2014-04-01 DIAGNOSIS — Z955 Presence of coronary angioplasty implant and graft: Secondary | ICD-10-CM | POA: Insufficient documentation

## 2014-04-01 DIAGNOSIS — Z6831 Body mass index (BMI) 31.0-31.9, adult: Secondary | ICD-10-CM | POA: Diagnosis not present

## 2014-04-01 DIAGNOSIS — Z7902 Long term (current) use of antithrombotics/antiplatelets: Secondary | ICD-10-CM | POA: Diagnosis not present

## 2014-04-01 DIAGNOSIS — E118 Type 2 diabetes mellitus with unspecified complications: Secondary | ICD-10-CM | POA: Diagnosis not present

## 2014-04-01 DIAGNOSIS — I252 Old myocardial infarction: Secondary | ICD-10-CM | POA: Diagnosis not present

## 2014-04-01 DIAGNOSIS — N401 Enlarged prostate with lower urinary tract symptoms: Secondary | ICD-10-CM | POA: Insufficient documentation

## 2014-04-01 DIAGNOSIS — N3941 Urge incontinence: Secondary | ICD-10-CM | POA: Insufficient documentation

## 2014-04-01 DIAGNOSIS — Z7982 Long term (current) use of aspirin: Secondary | ICD-10-CM | POA: Diagnosis not present

## 2014-04-01 DIAGNOSIS — I10 Essential (primary) hypertension: Secondary | ICD-10-CM | POA: Insufficient documentation

## 2014-04-01 DIAGNOSIS — Z8042 Family history of malignant neoplasm of prostate: Secondary | ICD-10-CM | POA: Diagnosis not present

## 2014-04-01 DIAGNOSIS — Z79899 Other long term (current) drug therapy: Secondary | ICD-10-CM | POA: Insufficient documentation

## 2014-04-01 DIAGNOSIS — E785 Hyperlipidemia, unspecified: Secondary | ICD-10-CM | POA: Insufficient documentation

## 2014-04-01 DIAGNOSIS — Z794 Long term (current) use of insulin: Secondary | ICD-10-CM | POA: Insufficient documentation

## 2014-04-01 DIAGNOSIS — E669 Obesity, unspecified: Secondary | ICD-10-CM | POA: Insufficient documentation

## 2014-04-01 HISTORY — DX: Polyneuropathy, unspecified: G62.9

## 2014-04-01 HISTORY — DX: Personal history of colon polyps, unspecified: Z86.0100

## 2014-04-01 HISTORY — PX: INSERTION OF SUPRAPUBIC CATHETER: SHX5870

## 2014-04-01 HISTORY — PX: CRYOABLATION: SHX1415

## 2014-04-01 HISTORY — DX: Personal history of colonic polyps: Z86.010

## 2014-04-01 HISTORY — DX: Type 2 diabetes mellitus without complications: E11.9

## 2014-04-01 HISTORY — DX: Diverticulosis of large intestine without perforation or abscess without bleeding: K57.30

## 2014-04-01 HISTORY — DX: Type 2 diabetes mellitus with mild nonproliferative diabetic retinopathy without macular edema, unspecified eye: E11.3299

## 2014-04-01 HISTORY — DX: Unspecified right bundle-branch block: I45.10

## 2014-04-01 LAB — POCT I-STAT, CHEM 8
BUN: 20 mg/dL (ref 6–23)
CALCIUM ION: 1.25 mmol/L (ref 1.13–1.30)
Chloride: 100 mmol/L (ref 96–112)
Creatinine, Ser: 1.1 mg/dL (ref 0.50–1.35)
Glucose, Bld: 190 mg/dL — ABNORMAL HIGH (ref 70–99)
HCT: 47 % (ref 39.0–52.0)
HEMOGLOBIN: 16 g/dL (ref 13.0–17.0)
Potassium: 4.2 mmol/L (ref 3.5–5.1)
Sodium: 141 mmol/L (ref 135–145)
TCO2: 26 mmol/L (ref 0–100)

## 2014-04-01 LAB — GLUCOSE, CAPILLARY: Glucose-Capillary: 185 mg/dL — ABNORMAL HIGH (ref 70–99)

## 2014-04-01 SURGERY — CRYOABLATION, PROSTATE
Anesthesia: General | Site: Prostate

## 2014-04-01 MED ORDER — OXYCODONE-ACETAMINOPHEN 5-325 MG PO TABS: 1.0000 | ORAL_TABLET | ORAL | Status: DC | PRN

## 2014-04-01 MED ORDER — PROPOFOL INFUSION 10 MG/ML OPTIME
INTRAVENOUS | Status: DC | PRN
  Administered 2014-04-01: 200 mL via INTRAVENOUS

## 2014-04-01 MED ORDER — ONDANSETRON HCL 4 MG/2ML IJ SOLN
INTRAMUSCULAR | Status: DC | PRN
  Administered 2014-04-01: 4 mg via INTRAVENOUS

## 2014-04-01 MED ORDER — NEOSTIGMINE METHYLSULFATE 10 MG/10ML IV SOLN
INTRAVENOUS | Status: DC | PRN
  Administered 2014-04-01: 4 mg via INTRAVENOUS

## 2014-04-01 MED ORDER — LIDOCAINE HCL (CARDIAC) 20 MG/ML IV SOLN
INTRAVENOUS | Status: DC | PRN
  Administered 2014-04-01: 80 mg via INTRAVENOUS

## 2014-04-01 MED ORDER — ACETAMINOPHEN 10 MG/ML IV SOLN
INTRAVENOUS | Status: DC | PRN
  Administered 2014-04-01: 1000 mg via INTRAVENOUS

## 2014-04-01 MED ORDER — HYDROMORPHONE HCL 1 MG/ML IJ SOLN
0.2500 mg | INTRAMUSCULAR | Status: DC | PRN
  Administered 2014-04-01 (×2): 0.25 mg via INTRAVENOUS
  Filled 2014-04-01: qty 1

## 2014-04-01 MED ORDER — MIDAZOLAM HCL 5 MG/5ML IJ SOLN
INTRAMUSCULAR | Status: DC | PRN
  Administered 2014-04-01: 2 mg via INTRAVENOUS

## 2014-04-01 MED ORDER — PHENAZOPYRIDINE HCL 100 MG PO TABS
ORAL_TABLET | ORAL | Status: AC
  Filled 2014-04-01: qty 2

## 2014-04-01 MED ORDER — DEXAMETHASONE SODIUM PHOSPHATE 10 MG/ML IJ SOLN
INTRAMUSCULAR | Status: DC | PRN
  Administered 2014-04-01: 10 mg via INTRAVENOUS

## 2014-04-01 MED ORDER — FENTANYL CITRATE 0.05 MG/ML IJ SOLN
INTRAMUSCULAR | Status: DC | PRN
  Administered 2014-04-01 (×4): 50 ug via INTRAVENOUS

## 2014-04-01 MED ORDER — LACTATED RINGERS IV SOLN
INTRAVENOUS | Status: DC
  Administered 2014-04-01 (×3): via INTRAVENOUS
  Filled 2014-04-01: qty 1000

## 2014-04-01 MED ORDER — TRIMETHOPRIM 100 MG PO TABS
100.0000 mg | ORAL_TABLET | ORAL | Status: DC
Start: 2014-04-01 — End: 2014-11-14

## 2014-04-01 MED ORDER — BELLADONNA ALKALOIDS-OPIUM 16.2-60 MG RE SUPP
RECTAL | Status: AC
  Filled 2014-04-01: qty 1

## 2014-04-01 MED ORDER — KETOROLAC TROMETHAMINE 30 MG/ML IJ SOLN
INTRAMUSCULAR | Status: DC | PRN
  Administered 2014-04-01: 30 mg via INTRAVENOUS

## 2014-04-01 MED ORDER — MIDAZOLAM HCL 2 MG/2ML IJ SOLN
INTRAMUSCULAR | Status: AC
  Filled 2014-04-01: qty 2

## 2014-04-01 MED ORDER — FENTANYL CITRATE 0.05 MG/ML IJ SOLN
INTRAMUSCULAR | Status: AC
  Filled 2014-04-01: qty 4

## 2014-04-01 MED ORDER — ROCURONIUM BROMIDE 100 MG/10ML IV SOLN
INTRAVENOUS | Status: DC | PRN
Start: 2014-04-01 — End: 2014-04-01
  Administered 2014-04-01 (×3): 10 mg via INTRAVENOUS
  Administered 2014-04-01: 50 mg via INTRAVENOUS

## 2014-04-01 MED ORDER — CEFAZOLIN SODIUM-DEXTROSE 2-3 GM-% IV SOLR
INTRAVENOUS | Status: AC
  Filled 2014-04-01: qty 50

## 2014-04-01 MED ORDER — STERILE WATER FOR IRRIGATION IR SOLN
Status: DC | PRN
  Administered 2014-04-01: 3000 mL

## 2014-04-01 MED ORDER — CEFAZOLIN SODIUM-DEXTROSE 2-3 GM-% IV SOLR
2.0000 g | INTRAVENOUS | Status: AC
  Administered 2014-04-01: 2 g via INTRAVENOUS
  Filled 2014-04-01: qty 50

## 2014-04-01 MED ORDER — HYDROMORPHONE HCL 1 MG/ML IJ SOLN
INTRAMUSCULAR | Status: AC
  Filled 2014-04-01: qty 1

## 2014-04-01 MED ORDER — PHENAZOPYRIDINE HCL 200 MG PO TABS
200.0000 mg | ORAL_TABLET | Freq: Three times a day (TID) | ORAL | Status: DC | PRN
Start: 2014-04-01 — End: 2014-10-31

## 2014-04-01 MED ORDER — GLYCOPYRROLATE 0.2 MG/ML IJ SOLN
INTRAMUSCULAR | Status: DC | PRN
  Administered 2014-04-01: 0.6 mg via INTRAVENOUS

## 2014-04-01 MED ORDER — PHENYLEPHRINE HCL 10 MG/ML IJ SOLN
10.0000 mg | INTRAVENOUS | Status: DC | PRN
  Administered 2014-04-01: 30 ug/min via INTRAVENOUS

## 2014-04-01 MED ORDER — PHENYLEPHRINE HCL 10 MG/ML IJ SOLN
INTRAMUSCULAR | Status: DC | PRN
Start: 2014-04-01 — End: 2014-04-01
  Administered 2014-04-01: 120 ug via INTRAVENOUS
  Administered 2014-04-01: 40 ug via INTRAVENOUS
  Administered 2014-04-01: 80 ug via INTRAVENOUS

## 2014-04-01 MED ORDER — PHENAZOPYRIDINE HCL 200 MG PO TABS
200.0000 mg | ORAL_TABLET | Freq: Three times a day (TID) | ORAL | Status: DC | PRN
  Administered 2014-04-01: 200 mg via ORAL
  Filled 2014-04-01: qty 1

## 2014-04-01 MED ORDER — SODIUM CHLORIDE 0.9 % IR SOLN
Status: DC | PRN
  Administered 2014-04-01: 3000 mL

## 2014-04-01 SURGICAL SUPPLY — 59 items
BAG DRAIN URO-CYSTO SKYTR STRL (DRAIN) ×4 IMPLANT
BAG DRN ANRFLXCHMBR STRAP LEK (BAG)
BAG DRN UROCATH (DRAIN) ×2
BAG URINE DRAINAGE (UROLOGICAL SUPPLIES) ×3 IMPLANT
BAG URINE LEG 19OZ MD ST LTX (BAG) IMPLANT
BAG URINE LEG 500ML (DRAIN) IMPLANT
BLADE CLIPPER SURG (BLADE) ×4 IMPLANT
BLADE SURG 15 STRL LF DISP TIS (BLADE) ×2 IMPLANT
BLADE SURG 15 STRL SS (BLADE) ×4
BNDG CONFORM 3 STRL LF (GAUZE/BANDAGES/DRESSINGS) IMPLANT
BOOTIES KNEE HIGH SLOAN (MISCELLANEOUS) ×4 IMPLANT
CANISTER SUCT LVC 12 LTR MEDI- (MISCELLANEOUS) IMPLANT
CANISTER SUCTION 2500CC (MISCELLANEOUS) IMPLANT
CATH FOLEY 2WAY SLVR  5CC 16FR (CATHETERS) ×2
CATH FOLEY 2WAY SLVR  5CC 18FR (CATHETERS) ×2
CATH FOLEY 2WAY SLVR 5CC 16FR (CATHETERS) IMPLANT
CATH FOLEY 2WAY SLVR 5CC 18FR (CATHETERS) ×2 IMPLANT
CHARGE TECH PROCEDURE ONCURA (LABOR (TRAVEL & OVERTIME)) ×4 IMPLANT
CLOTH BEACON ORANGE TIMEOUT ST (SAFETY) ×4 IMPLANT
COVER MAYO STAND STRL (DRAPES) ×4 IMPLANT
COVER TABLE BACK 60X90 (DRAPES) ×4 IMPLANT
DRAPE CAMERA CLOSED 9X96 (DRAPES) ×1 IMPLANT
DRAPE INCISE IOBAN 66X45 STRL (DRAPES) ×4 IMPLANT
DRAPE UNDERBUTTOCKS STRL (DRAPE) ×4 IMPLANT
DRSG TEGADERM 4X4.75 (GAUZE/BANDAGES/DRESSINGS) ×12 IMPLANT
DRSG TEGADERM 8X12 (GAUZE/BANDAGES/DRESSINGS) ×2 IMPLANT
ELECT REM PT RETURN 9FT ADLT (ELECTROSURGICAL) ×4
ELECTRODE REM PT RTRN 9FT ADLT (ELECTROSURGICAL) ×2 IMPLANT
GAS ARGON HIGH PRESSURE (MEDICAL GASES) ×6 IMPLANT
GAS HELIUM HIGH PRESSURE (MEDICAL GASES) ×4 IMPLANT
GAUZE SPONGE 4X4 12PLY STRL (GAUZE/BANDAGES/DRESSINGS) ×6 IMPLANT
GLOVE BIO SURGEON STRL SZ7.5 (GLOVE) ×4 IMPLANT
GOWN PREVENTION PLUS LG XLONG (DISPOSABLE) ×1 IMPLANT
GOWN STRL REIN XL XLG (GOWN DISPOSABLE) ×1 IMPLANT
GOWN STRL REUS W/TWL XL LVL3 (GOWN DISPOSABLE) ×4 IMPLANT
GUIDEWIRE SUPER STIFF (WIRE) ×4 IMPLANT
HOLDER FOLEY CATH W/STRAP (MISCELLANEOUS) ×4 IMPLANT
KIT CRYO ENDOCARE (DISPOSABLE) ×3 IMPLANT
KIT ICE ROD PROCEDURE PRECISE (DISPOSABLE) IMPLANT
KIT PROSTATE PRESICE I (KITS) IMPLANT
NDL SPNL 18GX3.5 QUINCKE PK (NEEDLE) IMPLANT
NEEDLE HYPO 22GX1.5 SAFETY (NEEDLE) IMPLANT
NEEDLE SPNL 18GX3.5 QUINCKE PK (NEEDLE) IMPLANT
NS IRRIG 500ML POUR BTL (IV SOLUTION) IMPLANT
PACK BASIN DAY SURGERY FS (CUSTOM PROCEDURE TRAY) ×4 IMPLANT
PACK CYSTO (CUSTOM PROCEDURE TRAY) ×4 IMPLANT
PLUG CATH AND CAP STER (CATHETERS) IMPLANT
SET IRRIG Y TYPE TUR BLADDER L (SET/KITS/TRAYS/PACK) ×4 IMPLANT
SHEET LAVH (DRAPES) ×2 IMPLANT
SPONGE GAUZE 4X4 12PLY STER LF (GAUZE/BANDAGES/DRESSINGS) ×4 IMPLANT
SUT ETHILON 2 0 FS 18 (SUTURE) ×3 IMPLANT
SUT SILK 2 0 FS (SUTURE) ×2 IMPLANT
SUT VIC AB 2-0 UR6 27 (SUTURE) ×2 IMPLANT
SYRINGE 10CC LL (SYRINGE) ×6 IMPLANT
SYRINGE IRR TOOMEY STRL 70CC (SYRINGE) IMPLANT
TRAY DSU PREP LF (CUSTOM PROCEDURE TRAY) ×4 IMPLANT
UNDERPAD 30X30 INCONTINENT (UNDERPADS AND DIAPERS) ×4 IMPLANT
WATER STERILE IRR 3000ML UROMA (IV SOLUTION) ×4 IMPLANT
WATER STERILE IRR 500ML POUR (IV SOLUTION) ×4 IMPLANT

## 2014-04-01 NOTE — Op Note (Signed)
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  Postoperative diagnosis: Same    Operation:  Cryotherapy of the prostate with placement of superpubic catheter   Surgeon:  S. Gaynelle Arabian, MD  First assistant: None    Anesthesia:  General LMA   Preparation:  After appropriate preanesthesia, the patient was brought to the operative room, placed on the operative table in the dorsal supine position where general LMA anesthesia was introduced. He was then replaced in the dorsal lithotomy position with the pubis was prepped with Betadine solution and draped in usual fashion. The arm and was double checked. The history was double checked.  Review history:  Problems  1. 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 Elevated prostate specific antigen (PSA) (R97.2) 3. Prostate cancer (C61)  Assessed By: Carolan Clines (Urology); Last Assessed: 25 Feb 2014 4. Urge incontinence of urine (N39.41) 5. Urinary urgency (R39.15)  History of Present Illness   69 yo IDDM male returns today for a 6 week f/u & prostate u/s for size after taking Finasteride. Hx of prostate cancer, awaiting cryotherapy. S/p prostate biopsy on 09/20/13. Hx of BPH and elevated PSA. He is post Pbx 2007, with 66cc gland, and BPH on bx. Note hx diverticulitis with hx colon resection. He was treated briefly with Avodart.     Re-evaluation of his prostate size was 114cc on 01/08/14. At time of biopsy, prostate size was 118cc. Biopsy on 09/20/13 showing 2 areas of G 3+3 adenocaP, in the Right lateral apex (60%); and G 3+3=6 (20%) in the Right medial apex biopsies.   Statement of  Likelihood of Success: Excellent. TIME-OUT observed.:  Procedure:  With the patient in the dorsal lithotomy position, the prostate gland is again not evaluated with the prostate ultrasound, and noted to be 71.4 mL by volume today. Although he has only an  international prostate symptom score of 13/7, I elected to place a suprapubic catheter first, prior to cryotherapy of the prostate. The patient artery has erectile dysfunction.  With the patient in dorsal lithotomy position, flexible cystoscopy was accomplished showing massive trilobar BPH. Trabeculation was noted but no definite so formation was identified. There was no bladder tumor, or diverticular formation either. With the bladder full, the scope was removed, and the Lowsley tractor was placed. I palpated the Lowsley tractor in the midline, and performed a cutdown with a 15 blade until the Victor tractor was brought into the suprapubic space. A 16 French Foley catheter was then placed in the jaws of the [place] tractor, and sutured with 0 silk to the tractor. The tractor was closed, and then brought antegrade through the urethra. The suture was removed. The catheter was then drawn retrograde into the bladder and 10 mL was placed in the balloon. Flex we'll cystoscopy was accomplished and showed the balloon to be in the bladder, and it was snugged against the bladder wall. The suprapubic incision was closed with a 2-0 Vicryl subcutaneous suture, and a 3-0 nylon skin suture. A plug was placed in the catheter.  Foley catheter was then placed through the urethra, and the scrotum and testicles were protected by taping them to the pubis, allowing access to the perineum. The prostate ultrasound probe was then placed in the rectum, and the prostate identified. The prostate ultrasound-guided volume was noted to be 71.4 mL today. The prostate was eccentric, in its volume, and the urethra was noted to be off center.  5 rows of  cryotherapy needles were then placed, with care taken to prepare for avoidance of the urethra, and of the rectum, and of the bladder. This required evaluation in both the horizontal planes as well as longitudinal plane. Following successful placement of the cryoprobes, I placed both a and  obviate's fascia probe, and a rectal fascial probe. Cystoscopy was then accomplished, showing no needles within the urethra, or within the bladder. A guidewire was placed through the flexural cystoscope, and the scope was removed. Over the guidewire, a urethral warming device was placed, and remained in place, for 20 minutes after the final freeze and thaw were completed.  The patient then underwent 2 separate freeze/thaw cycles, using argon/helium gases. During the second thaw, the gas was discontinued, and the patient was allowed to fall naturally. Once the thawing process was complete, the needles were removed. During the procedure, the temperature in the prostate approached -40C, but the urethra and the rectum remained protected with the use of a urethral warming device.  Following removal of the urethral warming device, the 16 French Foley catheter was replaced, and drained to a Foley catheter bag. The patient received IV Tylenol and IV Toradol. Also received IV antibiotic. He was awakened, and taken to recovery room in good condition.

## 2014-04-01 NOTE — Discharge Instructions (Addendum)
HOME CARE INSTRUCTION Get plenty of rest for the remainder of the day. A responsible adult should stay with you for 24 hours following the procedure.  For the next 24 hours, DO NOT: -Drive a car -Paediatric nurse -Drink alcoholic beverages -Take any medication unless instructed by your physician -Make any legal decisions or sign important papers.  Meals: Start with liquid foods such as gelatin or soup. Progress to regular foods as tolerated. Avoid greasy, spicy, heavy foods. If nausea and/or vomiting occur, drink only clear liquids until the nausea and/or vomiting subsides. Call your physician if vomiting continues.  Special Instructions/Symptoms: Your throat may feel dry or sore from the anesthesia or the breathing tube placed in your throat during surgery. If this causes discomfort, gargle with warm salt water. The discomfort should disappear within 24 hours.  INSTRUCTIONS AFTER CRYOABLATION OF THE PROSTATE  Normal Findings After Cryoablation:   You may experience a number of symptoms after the cryoablation which occur in  some patients.  There is no cause for alarm should this happen.  These    symptoms include:  -Blood in the urine.  When you are discharged after your surgery, your urine will have some blood in it and may appear red.  This occurs to some degree in all patients after cryoablation and is not cause for alarm.  Your urine should clear approximately 24 hours after the procedure, but may persist for quite a while longer.  -Scrotal and penile swelling and bruising.  This occurs about 2-3 days after   the cryoablation and is caused by tissue swelling which temporarily blocks the drainage of lymph.  This is painless and resolves in less than one week.  Ice packs and lying down for short periods of time during the day will improve the swelling.  -Small amounts of bloody discharge from the end of your penis.  This can  occur for up to 6 weeks after the procedure and is not cause for  alarm.  It is due  to some discharge from the urethra in the area of the prostate.  -Some numbness in the head of the penis.  Occasionally, when a large amount of freezing has been performed during the procedure, the nerve which supplies sensation to one or both sides of the head of the penis may be affected. The sensation returns after a number of months.  Call your doctor at [phone] if any of the following occur:               -If you have any pain, fever, or chills.  -If your foley catheter or suprapubic tube is not draining urine.  -If there is decreasing urinary stream.  This may indicate sloughing of some  dead tissue in the area of the prostate near the opening to the bladder.  It may clear on its own but,  if the problem becomes severe, it may require removal of the dead tissue through a cystoscope.  -If you have diarrhea after urination or foul-smelling urine.  These symptoms may indicate an urethrorectal fistula, which is a hole between the bladder channel and the rectum.  This should be investigated by your doctor.  -If you have any questions or problems.   Diet:  Resume your normal diet.   If you become constipated, you may try  over-the-counter remedies such as Milk of Magnesia.  If you are nauseated, vomiting or feel bloated, notify your doctor's office.  DO NOT GIVE YOURSELF ANY ENEMAS OR RECTAL MEDICATIONS.  THE RECTAL WALL IS THIN AFTER CRYOABLATION.  Activity:   -You may have swelling and bruising of the penis and scrotum.  Apply ice packs   to the area behind the scrotum intermittently for 24-48 hours after your surgery to keep the swelling down.  Wearing an athletic supporter (jock strap) might also help.  -Lying flat on your back will also help decrease the swelling.  You may find   when you are sitting up or walking around that the swelling increases.   -You will have  puncture wounds behind your scrotum making it painful to sit   down.  Use a hemorrhoid donut to sit on if  needed.  -You may shower on the second day after surgery.  -There are no lifting or driving restrictions.  Use caution while the suprapubic tube   is in place.   Medications:  -You may resume your preoperative medications, except aspirin and other blood   thinning agents.  Unless otherwise instructed, resume your aspirin after your suprapubic tube is removed.  If you are taking Coumadin or other blood thinners, please discuss when to restart these medications with your surgeon.  -Unless otherwise ordered, you will be given prescriptions for the following   medications which you will need to take after your procedure:   *Antibiotics -  to prevent infection while your suprapubic tube is in place.   *Anti-inflammatory - to prevent pain and to decrease the inflammation in   the area of the prostate.  You may take ibuprofen (Motrin, Advil),    acetaminophen (Tylenol) or naproxen (Aleve) as needed for discomfort.   Call your doctor's office at [phone] for an appointment in  weeks.  Patient Signature:  ________________________________________________________  Nurse's Signature:  ________________________________________________________   with your health care provider.

## 2014-04-01 NOTE — Anesthesia Postprocedure Evaluation (Signed)
  Anesthesia Post-op Note  Patient: Benjamin Barr  Procedure(s) Performed: Procedure(s) with comments: CRYO ABLATION PROSTATE (N/A) INSERTION OF SUPRAPUBIC CATHETER (N/A) - suprapubic   Patient Location: PACU  Anesthesia Type:General  Level of Consciousness: awake and alert   Airway and Oxygen Therapy: Patient Spontanous Breathing  Post-op Pain: none  Post-op Assessment: Post-op Vital signs reviewed, Patient's Cardiovascular Status Stable and Respiratory Function Stable  Post-op Vital Signs: Reviewed  Filed Vitals:   04/01/14 1345  BP: 161/81  Pulse: 65  Temp:   Resp: 16    Complications: No apparent anesthesia complications

## 2014-04-01 NOTE — Interval H&P Note (Signed)
History and Physical Interval Note:  04/01/2014 10:18 AM  Benjamin Barr  has presented today for surgery, with the diagnosis of PROSTATE CANCER  The various methods of treatment have been discussed with the patient and family. After consideration of risks, benefits and other options for treatment, the patient has consented to  Procedure(s): CRYO ABLATION PROSTATE (N/A) [place] (N/A) as a surgical intervention .  The patient's history has been reviewed, patient examined, no change in status, stable for surgery.  I have reviewed the patient's chart and labs.  Questions were answered to the patient's satisfaction.     Carolan Clines I

## 2014-04-01 NOTE — Anesthesia Procedure Notes (Addendum)
Procedure Name: LMA Insertion Date/Time: 04/01/2014 10:52 AM Performed by: Wanita Chamberlain Pre-anesthesia Checklist: Patient identified, Timeout performed, Emergency Drugs available, Suction available and Patient being monitored Patient Re-evaluated:Patient Re-evaluated prior to inductionOxygen Delivery Method: Circle system utilized Preoxygenation: Pre-oxygenation with 100% oxygen Intubation Type: IV induction LMA: LMA inserted LMA Size: 4.0 Number of attempts: 1 Placement Confirmation: breath sounds checked- equal and bilateral Tube secured with: Tape Dental Injury: Teeth and Oropharynx as per pre-operative assessment    Procedure Name: Intubation Date/Time: 04/01/2014 11:08 AM Performed by: Wanita Chamberlain Pre-anesthesia Checklist: Suction available, Patient being monitored and Emergency Drugs available Patient Re-evaluated:Patient Re-evaluated prior to inductionOxygen Delivery Method: Circle system utilized Intubation Type: IV induction and Inhalational induction Ventilation: Mask ventilation without difficulty Laryngoscope Size: Mac and 3 Grade View: Grade II Tube type: Oral Tube size: 8.0 mm Number of attempts: 1 Airway Equipment and Method: Stylet and Bite block Placement Confirmation: ETT inserted through vocal cords under direct vision,  positive ETCO2 and breath sounds checked- equal and bilateral Secured at: 22 cm Tube secured with: Tape Dental Injury: Teeth and Oropharynx as per pre-operative assessment  Comments: Elected to intubate pt after discussion with cryo rep. Regarding  Need for pt not to move at all during procedure.

## 2014-04-01 NOTE — Transfer of Care (Signed)
Immediate Anesthesia Transfer of Care Note  Patient: Benjamin Barr  Procedure(s) Performed: Procedure(s) (LRB): CRYO ABLATION PROSTATE (N/A) INSERTION OF SUPRAPUBIC CATHETER (N/A)  Patient Location: PACU  Anesthesia Type: General  Level of Consciousness: awake, oriented, sedated and patient cooperative  Airway & Oxygen Therapy: Patient Spontanous Breathing and Patient connected to face mask oxygen  Post-op Assessment: Report given to PACU RN and Post -op Vital signs reviewed and stable  Post vital signs: Reviewed and stable  Complications: No apparent anesthesia complications

## 2014-04-01 NOTE — Anesthesia Preprocedure Evaluation (Addendum)
Anesthesia Evaluation  Patient identified by MRN, date of birth, ID band Patient awake  General Assessment Comment:Neuropathy   . Coronary artery disease    stent of RCA, LCX with DES 2008 . Hypertension  . Diabetes mellitus  . Hyperlipidemia  . Obesity  . Prostate cancer        Reviewed: Allergy & Precautions, NPO status , Patient's Chart, lab work & pertinent test results  Airway Mallampati: II  TM Distance: >3 FB Neck ROM: Full   Comment: Limited neck extension Dental no notable dental hx. (+) Teeth Intact, Dental Advisory Given   Pulmonary neg pulmonary ROS,  breath sounds clear to auscultation  Pulmonary exam normal       Cardiovascular Exercise Tolerance: Good hypertension, Pt. on medications + CAD, + Past MI and + Cardiac Stents + dysrhythmias Rhythm:Regular Rate:Normal  ECG: SR, RBBB. Occasional PVCs  Cardiology office note, Dr. [name], 01-18-14: DES LCx and RCA in 2008. Normal myoview in 2013. No current cardiopulmonary symptoms.   Neuro/Psych Peripheral neuropathy.  Neuromuscular disease negative psych ROS   GI/Hepatic negative GI ROS, Neg liver ROS,   Endo/Other  diabetes, Poorly Controlled, Type 2, Oral Hypoglycemic Agents, Insulin Dependent  Renal/GU negative Renal ROS  negative genitourinary   Musculoskeletal negative musculoskeletal ROS (+)   Abdominal (+) + obese,   Peds negative pediatric ROS (+)  Hematology negative hematology ROS (+)   Anesthesia Other Findings   Reproductive/Obstetrics negative OB ROS                          Anesthesia Physical Anesthesia Plan  ASA: III  Anesthesia Plan: General   Post-op Pain Management:    Induction: Intravenous  Airway Management Planned: LMA  Additional Equipment:   Intra-op Plan:   Post-operative Plan: Extubation in OR  Informed Consent: I have reviewed the patients History and  Physical, chart, labs and discussed the procedure including the risks, benefits and alternatives for the proposed anesthesia with the patient or authorized representative who has indicated his/her understanding and acceptance.   Dental advisory given  Plan Discussed with: CRNA  Anesthesia Plan Comments:         Anesthesia Quick Evaluation

## 2014-04-01 NOTE — H&P (Signed)
Reason For Visit 6 week f/u & PUS   Active Problems Problems  1. 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 Elevated prostate specific antigen (PSA) (R97.2) 3. Prostate cancer (C61)   Assessed By: Carolan Clines (Urology); Last Assessed: 25 Feb 2014 4. Urge incontinence of urine (N39.41) 5. Urinary urgency (R39.15)  History of Present Illness    69 yo IDDM male returns today for a 6 week f/u & prostate u/s for size after taking Finasteride. Hx of prostate cancer, awaiting cryotherapy. S/p prostate biopsy on 09/20/13. Hx of BPH and elevated PSA. He is post Pbx 2007, with 66cc gland, and BPH on bx. Note hx diverticulitis with hx colon resection. He was treated briefly with Avodart.      Re-evaluation of his prostate size was 114cc on 01/08/14. At time of biopsy, prostate size was 118cc. Biopsy on 09/20/13 showing 2 areas of G 3+3 adenocaP, in the Right lateral apex (60%); and G 3+3=6 (20%) in the Right medial apex biopsies.      IPSS=13 only.     08/24/13 PHI - 46.93 (33% chance of having prostate cancer)  08/22/13 PSA - 8.06/27%   Past Medical History Problems  1. History of Acute Myocardial Infarction 2. History of Gout (M10.9) 3. History of cardiac disorder (Z86.79) 4. History of cardiac murmur (Z86.79) 5. History of diabetes mellitus (Z86.39) 6. History of diverticulitis of colon (Z87.19) 7. History of heart failure (Z86.79) 8. History of hypercholesterolemia (Z86.39) 9. History of hypertension (Z86.79)  Surgical History Problems  1. History of Appendectomy 2. History of Cath Stent Placement 3. History of Colon Surgery  Current Meds 1. Aspirin 325 MG Oral Tablet;  Therapy: (Recorded:23Sep2008) to Recorded 2. Finasteride 5 MG Oral Tablet; TAKE ONE TABLET BY MOUTH DAILY;  Therapy: 20Aug2015 to (Last Rx:20Aug2015)  Requested for: 20Aug2015 Ordered 3. Hydrochlorothiazide 25 MG Oral Tablet;  Therapy: [id number] to  Recorded 4. Lantus SOLN;  Therapy: (Recorded:21Dec2010) to Recorded 5. Lisinopril 20 MG Oral Tablet;  Therapy: [id number] to Recorded 6. MetFORMIN HCl - 1000 MG Oral Tablet;  Therapy: [id number] to Recorded 7. Plavix 75 MG Oral Tablet;  Therapy: (Recorded:23Sep2008) to Recorded 8. Simvastatin 40 MG Oral Tablet;  Therapy: (Recorded:23Sep2008) to Recorded 9. Victoza SOLN;  Therapy: (Recorded:15Jul2015) to Recorded 10. Vitamin D TABS;   Therapy: (Recorded:13Oct2010) to Recorded  Allergies Medication  1. Ciprofloxacin HCl TABS  Family History Problems  1. Family history of Bipolar Disorder : Mother 2. Family history of Death In The Family Father   50, heart attack 3. Family history of Death In The Family Mother   85, air in lungs after surgery 4. Family history of Family Health Status Number Of Children   2 sons 55. Family history of Nephrolithiasis : Father 88. Family history of Prostate Cancer : Father  Social History Problems  1. Denied: Alcohol Use 2. Caffeine Use   1 per day 3. Marital History - Currently Married 4. Never a smoker 5. Occupation:   retired 57. Denied: Tobacco Use  Review of Systems Genitourinary, constitutional, skin, eye, otolaryngeal, hematologic/lymphatic, cardiovascular, pulmonary, endocrine, musculoskeletal, gastrointestinal, neurological and psychiatric system(s) were reviewed and pertinent findings if present are noted and are otherwise negative.  Genitourinary: urinary frequency, feelings of urinary urgency, nocturia, weak urinary stream, urinary stream starts and stops and incomplete emptying of bladder.    Vitals Vital Signs [Data Includes: Last 1 Day]  Recorded: [id number] 02:32PM  Blood Pressure: 156 / 75 Temperature: 98.5 F Heart Rate: 78  Physical  Exam Constitutional: Well nourished and well developed . No acute distress.  ENT:. The ears and nose are normal in appearance.  Neck: The appearance of the neck is normal and no neck  mass is present.  Pulmonary: No respiratory distress and normal respiratory rhythm and effort.  Cardiovascular: Heart rate and rhythm are normal . No peripheral edema.  Abdomen: Incision site(s) well healed. The abdomen is obese, but not distended. The abdomen is soft and nontender. No CVA tenderness. Bowel sounds are normal. periumbilical, upper midline, left inguinal incisional hernia is present.  An umbilical hernia is present.  Rectal: Rectal exam demonstrates normal sphincter tone, no tenderness and no masses. Estimated prostate size is 4+. The prostate has no nodularity and is not tender. The left seminal vesicle is nonpalpable. The right seminal vesicle is nonpalpable. The perineum is normal on inspection.  Genitourinary: Examination of the penis demonstrates no discharge, no masses, no lesions and a normal meatus. The scrotum is without lesions. The right epididymis is palpably normal and non-tender. The left epididymis is palpably normal and non-tender. The right testis is non-tender and without masses. The left testis is non-tender and without masses.  Lymphatics: The femoral and inguinal nodes are not enlarged or tender.  Skin: Normal skin turgor, no visible rash and no visible skin lesions.  Neuro/Psych:. Mood and affect are appropriate.    Results/Data Selected Results  PSA REFLEX TO FREE [id number] 12:06PM Carolan Clines  SPECIMEN TYPE: BLOOD   Test Name Result Flag Reference  PSA 8.06 ng/mL H <=4.00  TEST METHODOLOGY: ECLIA PSA (ELECTROCHEMILUMINESCENCE IMMUNOASSAY)  PSA, FREE 2.18 ng/mL    PSA, %FREE 27 %  > 25  PROBABILITY OF PROSTATE CANCER   (FOR MEN WITH NON-SUSPICIOUS DRE RESULTS AND PSA BETWEEN 4 AND   10 NG/ML, BY PATIENT AGE)     % FREE PSA                          PATIENT AGE                          50 TO 59 YEARS  60 TO 69 YEARS  >70 YEARS    <=10%                  49.2%           57.5%          64.5%    11 - 18%               26.9%           33.9%           40.8%    19 - 25%               18.3%           23.9%          29.7%    >25%                    9.1%           12.2%          15.8%   Procedure Prostate u/s today: Length - 6.55cm, Height - 5.57cm and Width - 5.66cm. Total volume - 108.18 grams. Cystic area seen at mid apex, toward Lt side measured 0.71 x 0.80 x 0.68cm.     Assessment Assessed  1. Prostate  cancer (C61) 2. Benign localized hyperplasia of prostate with urinary obstruction (N40.1,N13.8) 3. Elevated prostate specific antigen (PSA) (R97.2) 4. Urge incontinence of urine (N39.41) 5. Urinary urgency (R39.15)  68 yo male with CaP, awaiting Cryotherapy, with prostate growth from 66 cc to 108cc today. He is on finasteride. He has slight urgency, and urge incontinence. Last psa 8.06 in July. Will get Clearance from Dr. Buddy Duty, and repeat psa here today. We will proceed with cryotherapy and s-p tube.   Plan Health Maintenance  1. UA With REFLEX; [Do Not Release]; Status:In Progress - Specimen/Data Collected;    Done: [id number] Prostate cancer  2. Follow-up Schedule Surgery Office  Follow-up  Status: Complete  Done: [id number] 3. PSA; Status:In Progress - Specimen/Data Collected;   Done: [id number] 4. VENIPUNCTURE; Status:Complete;   Done: 62HUT6546  May need to go ahead and have cryotherapy. prostate is not shrinking anymore. He is diabetic with elevated A1c levels ( approx 8), under treatment from Dr. Buddy Duty.   Discussion/Summary cc: Dr. Buddy Duty     Signatures Electronically signed by : Carolan Clines, M.D.; Feb 25 2014  3:40PM EST

## 2014-04-04 ENCOUNTER — Encounter (HOSPITAL_BASED_OUTPATIENT_CLINIC_OR_DEPARTMENT_OTHER): Payer: Self-pay | Admitting: Urology

## 2014-05-22 ENCOUNTER — Other Ambulatory Visit (HOSPITAL_COMMUNITY): Payer: Self-pay | Admitting: Cardiology

## 2014-05-22 ENCOUNTER — Ambulatory Visit (HOSPITAL_COMMUNITY): Payer: Medicare Other | Attending: Cardiology | Admitting: Cardiology

## 2014-05-22 DIAGNOSIS — I340 Nonrheumatic mitral (valve) insufficiency: Secondary | ICD-10-CM | POA: Insufficient documentation

## 2014-05-22 DIAGNOSIS — I358 Other nonrheumatic aortic valve disorders: Secondary | ICD-10-CM | POA: Diagnosis not present

## 2014-05-22 DIAGNOSIS — R600 Localized edema: Secondary | ICD-10-CM | POA: Diagnosis present

## 2014-05-22 NOTE — Progress Notes (Signed)
Echo performed. 

## 2014-10-30 ENCOUNTER — Ambulatory Visit: Payer: Medicare Other | Admitting: Cardiology

## 2014-10-30 ENCOUNTER — Telehealth: Payer: Self-pay | Admitting: Cardiology

## 2014-10-30 NOTE — Telephone Encounter (Signed)
Pt of Dr. [name] w/ PMH of CAD w/ DES in 2008, rheumatoid arthritis, type II DM, prostate CA Stress test in 2009 w/ 55% EF  Returned patient call. He reports tightness in chest on exertion, right arm numbness, occurring intermittently for 5-6 days. He does not have nitro on hand. Patient was encouraged to go to the ER - he refused to do so. States "pain is not occuring now", happens when he walks his dog. He thinks this pain is tied to his rheumatoid arthritis/PMR, ?according to patient, a recent lab test by PCP confirmed elevated number.  Thinks current symptoms tied to RA but still wants to be checked by cardiology. Patient was offered same-day w/ Kerin Ransom, after extensive discussion patient refused to be eval'd by PA on grounds that "they are just going to order a bunch of tests and not find a problem". I explained that the most appropriate evaluation would be in ER, but since patient is refusing, I was trying to accomodate him. Patient wanted to see if anything else could be offered, particularly any physician opening. Explained that Dr. [name] is out for next week. Offered an open slot on Dr. Allison Quarry schedule for 9/22 (DoD for that day). Gave appt time/loc/provider info to patient.  Encouraged patient to go to ER if changes/worsening of symptoms, esp w/ shortness of breath or diaphoresis.

## 2014-10-30 NOTE — Telephone Encounter (Signed)
[name] is calling because he is having shortness of breath , some chest pains ,and left arm is feeling numb .[name] Please call

## 2014-10-30 NOTE — Telephone Encounter (Signed)
OK - sound fine.  Leonie Man, MD

## 2014-10-31 ENCOUNTER — Encounter: Payer: Self-pay | Admitting: Cardiology

## 2014-10-31 ENCOUNTER — Ambulatory Visit (INDEPENDENT_AMBULATORY_CARE_PROVIDER_SITE_OTHER): Payer: Medicare Other | Admitting: Cardiology

## 2014-10-31 VITALS — BP 128/68 | HR 93 | Ht 68.0 in | Wt 208.5 lb

## 2014-10-31 DIAGNOSIS — R079 Chest pain, unspecified: Secondary | ICD-10-CM

## 2014-10-31 DIAGNOSIS — E1165 Type 2 diabetes mellitus with hyperglycemia: Secondary | ICD-10-CM | POA: Diagnosis not present

## 2014-10-31 DIAGNOSIS — Z9861 Coronary angioplasty status: Secondary | ICD-10-CM | POA: Diagnosis not present

## 2014-10-31 DIAGNOSIS — M353 Polymyalgia rheumatica: Secondary | ICD-10-CM

## 2014-10-31 DIAGNOSIS — I2510 Atherosclerotic heart disease of native coronary artery without angina pectoris: Secondary | ICD-10-CM | POA: Diagnosis not present

## 2014-10-31 DIAGNOSIS — E785 Hyperlipidemia, unspecified: Secondary | ICD-10-CM

## 2014-10-31 DIAGNOSIS — IMO0002 Reserved for concepts with insufficient information to code with codable children: Secondary | ICD-10-CM

## 2014-10-31 NOTE — Progress Notes (Signed)
PCP: Delrae Rend, MD  Clinic Note: Chief Complaint  Patient presents with  . arm pain    left arm pain,    HPI: Benjamin Barr is a 69 y.o. male with a PMH below who presents today for urgent evaluation for chest tightness & arm pain. Pt of Dr. [name] w/ PMH of CAD w/ Taxus DES to LCx & RCA in 2008, rheumatoid arthritis (& new Dx of PMR), type II DM, prostate CA.   His last stress test in Dec 2013 was normal. EF 54%.  LEITH SZAFRANSKI was last seen in Dec 2015 by Dr. [name].  Recent Hospitalizations: Feb 2016 - for Prostate Sgx.; Recent Dx with Poly Myalgia Rheumatica -- on prednisone.  Studies Reviewed:   Echo April 2016: Mild LVH. EF 60-65%, no RWMA, Gr 1 DD, Mod Ao Sclerosis - no stenosis. Mild AI.  Interval History: Usually does ~2 hr walk with dog.   Over past ~week or so, would note discomfort starting distally & moving proximal to axilla -- felt unusual, sort of numb.  Over the past few days, he has started to note the discomfort across his chest. Goes away with rest, but is usually ok if he keeps walking. Self limiting - regardless of walking or not.  Not sure if this is related to having recently stopped Prednisone for PMR Chest discomfort is often associated with dyspnea but also legally rest. No resting dyspnea. No CHF symptoms of PND, orthopnea or edema.  No palpitations, lightheadedness, dizziness, weakness or syncope/near syncope. No TIA/amaurosis fugax symptoms.  ROS: A comprehensive was performed. Review of Systems  Constitutional: Positive for malaise/fatigue.  HENT: Negative for nosebleeds.   Respiratory: Positive for shortness of breath.   Cardiovascular: Negative for palpitations and claudication.  Gastrointestinal: Negative for heartburn, abdominal pain, blood in stool and melena.  Genitourinary: Negative for hematuria.  Musculoskeletal: Positive for myalgias and joint pain.       Bilateral arms shoulders and back discomfort.  Neurological: Positive for  dizziness and weakness (Generalized weakness from PMR). Negative for focal weakness.  Endo/Heme/Allergies: Does not bruise/bleed easily.  Psychiatric/Behavioral: Negative.   All other systems reviewed and are negative.   Past Medical History  Diagnosis Date  . Hypertension   . Hyperlipidemia   . Obesity   . Prostate cancer   . CAD S/P percutaneous coronary angioplasty 2008    CARDIOLOGIST-- DR [name]: stent of RCA, LCX  with DES 2008  . S/P drug eluting coronary stent placement 2008    x3 Taxus DES  to mRCA, prox & mid LCFX in 2008  . Peripheral neuropathy   . Type 2 diabetes mellitus   . History of colon polyps     2014--  hyperplastic, adenoma, and benign polyps  . Diverticulosis of colon   . Nonproliferative diabetic retinopathy   . RBBB (right bundle branch block)     3 chronic     Past Surgical History  Procedure Laterality Date  . Appendectomy    . Partial colectomy    . Cardiovascular stress test  01-17-2012  dr [name]    Normal perfusion study/ no ischemia or infarction/  normal LVF and wall motion, ef 54%  . Coronary angioplasty with stent placement  05/04/2006   dr [name]    Severe 2-vessel obstructive CAD/  normal LVF/  PCI with DES to proxima and mid CFX and mRCA (total 3 DES)  . Cryoablation N/A 04/01/2014    Procedure: CRYO ABLATION PROSTATE;  Surgeon: Pierre Bali  Joya San, MD;  Location: [facility];  Service: Urology;  Laterality: N/A;  . Insertion of suprapubic catheter N/A 04/01/2014    Procedure: INSERTION OF SUPRAPUBIC CATHETER;  Surgeon: [name], MD;  [facility];  Service: Urology;  Laterality: N/A;  suprapubic     Prior to Admission medications   Medication Sig Start Date End Date Taking? Authorizing Provider  ascorbic Acid (VITAMIN C) 500 MG CPCR Take 500 mg by mouth daily.   Yes Historical Provider, MD  aspirin 81 MG tablet Take 81 mg by mouth daily.    Yes Historical Provider, MD  clopidogrel  (PLAVIX) 75 MG tablet Take 75 mg by mouth daily.   Yes Historical Provider, MD  ferrous fumarate (HEMOCYTE - 106 MG FE) 325 (106 FE) MG TABS tablet Take 1 tablet by mouth daily.   Yes Historical Provider, MD  hydrochlorothiazide (HYDRODIURIL) 25 MG tablet Take 25 mg by mouth daily.     Yes Historical Provider, MD  insulin glargine (LANTUS) 100 UNIT/ML injection Inject 150 Units into the skin at bedtime.    Yes Historical Provider, MD  KLOR-CON M20 20 MEQ tablet Take 20 mEq by mouth daily. 10/24/14  Yes Historical Provider, MD  simvastatin (ZOCOR) 40 MG tablet Take 40 mg by mouth at bedtime. 10/19/13  Yes Historical Provider, MD  trimethoprim (TRIMPEX) 100 MG tablet Take 1 tablet (100 mg total) by mouth 1 day or 1 dose. 04/01/14  Yes Carolan Clines, MD   No Known Allergies   Social History   Social History  . Marital Status: Married    Spouse Name: N/A  . Number of Children: N/A  . Years of Education: N/A   Social History Main Topics  . Smoking status: Never Smoker   . Smokeless tobacco: None  . Alcohol Use: No  . Drug Use: No  . Sexual Activity: Not Asked   Other Topics Concern  . None   Social History Narrative   Family History  Problem Relation Age of Onset  . Diverticulitis Mother   . Heart failure Father     Wt Readings from Last 3 Encounters:  10/31/14 208 lb 8 oz (94.575 kg)  04/01/14 208 lb (94.348 kg)  01/18/14 207 lb 8 oz (94.121 kg)    PHYSICAL EXAM BP 128/68 mmHg  Pulse 93  Ht 5\' 8"  (1.727 m)  Wt 208 lb 8 oz (94.575 kg)  BMI 31.71 kg/m2 General appearance: alert, cooperative, appears stated age, no distress and mildly obese; well-nourished well-groomed. Neck: no adenopathy, no carotid bruit and no JVD Lungs: clear to auscultation bilaterally, normal percussion bilaterally and non-labored Heart: regular rate and rhythm, S1, S2 normal, no murmur, click, rub or gallop; nondisplaced PMI. Abdomen: soft, non-tender; bowel sounds normal; no masses,  no  organomegaly; no HJR Extremities: extremities normal, atraumatic, no cyanosis, or edema Pulses: 2+ and symmetric; Skin: mobility and turgor normal and Warm and dry  Neurologic: Mental status: Alert, oriented, thought content appropriate Cranial nerves: normal (II-XII grossly intact)    Adult ECG Report - from PCP's office  Rate: 82 ;  Rhythm: normal sinus rhythm; RBBB - with otherwise normal. Duration, axis and intervals.  Narrative Interpretation: stable EKG   Other studies Reviewed: Additional studies/ records that were reviewed today include:  Recent Labs:  Followed by PCP    ASSESSMENT / PLAN: Problem List Items Addressed This Visit    CAD S/P percutaneous coronary angioplasty - Primary (Chronic)    He  is on Plavix and statin only now. No longer on beta blocker, ACE-I or ARB. I'm not. These symptoms are currently anginal in nature or not. Has not had an ischemic evaluation since 2013.  Plan: Lexiscan Myoview. -- would consider beta blocker, especially if stress test is abnormal.      Relevant Orders   Myocardial Perfusion Imaging   Chest pain with moderate risk for cardiac etiology (Chronic)    Given his history of coronary disease and apparently has exertional chest discomfort that has some features typical for recurrent exertional angina, we need to exclude an ischemic etiology. He never really had classic symptoms in the past, but mostly dyspnea but was noted to his PCI.  He did note some exertional dyspnea as well. Therefore we will evaluate with Myoview stress test.  Plan:Treadmill Myoview      Relevant Orders   Myocardial Perfusion Imaging   DM (diabetes mellitus), type 2, uncontrolled   Relevant Orders   Myocardial Perfusion Imaging   Hyperlipidemia    On statin. Needs to work on diet and exercise. Labs unavailable      PMR (polymyalgia rheumatica) (Chronic)    Was well controlled on prednisone. Now not only on prednisone. It is quite possible this current  symptoms are related to PMR as opposed to angina.   Myoview to exclude angina.  This will allow his Rheumatologist to better understand his symptoms.         Current medicines are reviewed at length with the patient today. (+/- concerns) none The following changes have been made: none  Studies Ordered:   Orders Placed This Encounter  Procedures  . Myocardial Perfusion Imaging    ROV with Dr. [name] following Myoview   HARDING, Leonie Green, M.D., M.S. Interventional Cardiologist   Pager # [phone]

## 2014-10-31 NOTE — Patient Instructions (Signed)
Your physician has requested that you have en exercise stress myoview. For further information please visit [email or web address]. Please follow instruction sheet, as given.   NO CHANGE WITH CURRENT MEDICATIONS   Your physician recommends that you schedule a follow-up appointment in FOLLOW UP APPOINTMENT WITH DR [name]

## 2014-11-02 ENCOUNTER — Encounter: Payer: Self-pay | Admitting: Cardiology

## 2014-11-02 DIAGNOSIS — M353 Polymyalgia rheumatica: Secondary | ICD-10-CM | POA: Insufficient documentation

## 2014-11-02 NOTE — Assessment & Plan Note (Signed)
Given his history of coronary disease and apparently has exertional chest discomfort that has some features typical for recurrent exertional angina, we need to exclude an ischemic etiology. He never really had classic symptoms in the past, but mostly dyspnea but was noted to his PCI.  He did note some exertional dyspnea as well. Therefore we will evaluate with Myoview stress test.  Plan:Treadmill Myoview

## 2014-11-02 NOTE — Assessment & Plan Note (Signed)
He is on Plavix and statin only now. No longer on beta blocker, ACE-I or ARB. I'm not. These symptoms are currently anginal in nature or not. Has not had an ischemic evaluation since 2013.  Plan: Lexiscan Myoview. -- would consider beta blocker, especially if stress test is abnormal.

## 2014-11-02 NOTE — Assessment & Plan Note (Signed)
Was well controlled on prednisone. Now not only on prednisone. It is quite possible this current symptoms are related to PMR as opposed to angina.   Myoview to exclude angina.  This will allow his Rheumatologist to better understand his symptoms.

## 2014-11-02 NOTE — Assessment & Plan Note (Signed)
On statin. Needs to work on diet and exercise. Labs unavailable

## 2014-11-04 ENCOUNTER — Encounter: Payer: Self-pay | Admitting: Cardiology

## 2014-11-05 ENCOUNTER — Telehealth (HOSPITAL_COMMUNITY): Payer: Self-pay

## 2014-11-05 NOTE — Telephone Encounter (Signed)
Encounter complete. 

## 2014-11-07 ENCOUNTER — Ambulatory Visit
Admission: RE | Admit: 2014-11-07 | Discharge: 2014-11-07 | Disposition: A | Discharge status: Home / Self Care | Payer: Medicare Other | Source: Ambulatory Visit | Attending: Cardiology | Admitting: Cardiology

## 2014-11-07 ENCOUNTER — Other Ambulatory Visit: Payer: Self-pay | Admitting: Cardiology

## 2014-11-07 ENCOUNTER — Other Ambulatory Visit: Payer: Self-pay

## 2014-11-07 ENCOUNTER — Encounter (HOSPITAL_COMMUNITY): Payer: Self-pay | Admitting: *Deleted

## 2014-11-07 ENCOUNTER — Ambulatory Visit (HOSPITAL_COMMUNITY)
Admission: RE | Admit: 2014-11-07 | Discharge: 2014-11-07 | Disposition: A | Discharge status: Home / Self Care | Payer: Medicare Other | Source: Ambulatory Visit | Attending: Cardiovascular Disease | Admitting: Cardiovascular Disease

## 2014-11-07 DIAGNOSIS — Z9861 Coronary angioplasty status: Secondary | ICD-10-CM | POA: Insufficient documentation

## 2014-11-07 DIAGNOSIS — I2510 Atherosclerotic heart disease of native coronary artery without angina pectoris: Secondary | ICD-10-CM

## 2014-11-07 DIAGNOSIS — R079 Chest pain, unspecified: Secondary | ICD-10-CM | POA: Insufficient documentation

## 2014-11-07 DIAGNOSIS — I4510 Unspecified right bundle-branch block: Secondary | ICD-10-CM | POA: Diagnosis not present

## 2014-11-07 DIAGNOSIS — E1165 Type 2 diabetes mellitus with hyperglycemia: Secondary | ICD-10-CM | POA: Diagnosis not present

## 2014-11-07 DIAGNOSIS — R9439 Abnormal result of other cardiovascular function study: Secondary | ICD-10-CM | POA: Diagnosis not present

## 2014-11-07 DIAGNOSIS — R0609 Other forms of dyspnea: Secondary | ICD-10-CM | POA: Diagnosis not present

## 2014-11-07 DIAGNOSIS — I10 Essential (primary) hypertension: Secondary | ICD-10-CM | POA: Insufficient documentation

## 2014-11-07 DIAGNOSIS — Z6831 Body mass index (BMI) 31.0-31.9, adult: Secondary | ICD-10-CM | POA: Diagnosis not present

## 2014-11-07 DIAGNOSIS — Z8249 Family history of ischemic heart disease and other diseases of the circulatory system: Secondary | ICD-10-CM | POA: Diagnosis not present

## 2014-11-07 DIAGNOSIS — E669 Obesity, unspecified: Secondary | ICD-10-CM | POA: Diagnosis not present

## 2014-11-07 DIAGNOSIS — R5383 Other fatigue: Secondary | ICD-10-CM | POA: Insufficient documentation

## 2014-11-07 DIAGNOSIS — IMO0002 Reserved for concepts with insufficient information to code with codable children: Secondary | ICD-10-CM

## 2014-11-07 LAB — CBC WITH DIFFERENTIAL/PLATELET
BASOS ABS: 0 10*3/uL (ref 0.0–0.1)
Basophils Relative: 0 % (ref 0–1)
EOS PCT: 1 % (ref 0–5)
Eosinophils Absolute: 0.1 10*3/uL (ref 0.0–0.7)
HEMATOCRIT: 44.1 % (ref 39.0–52.0)
Hemoglobin: 14.8 g/dL (ref 13.0–17.0)
LYMPHS ABS: 1.8 10*3/uL (ref 0.7–4.0)
LYMPHS PCT: 18 % (ref 12–46)
MCH: 27.5 pg (ref 26.0–34.0)
MCHC: 33.6 g/dL (ref 30.0–36.0)
MCV: 81.8 fL (ref 78.0–100.0)
MONO ABS: 0.5 10*3/uL (ref 0.1–1.0)
MPV: 11.6 fL (ref 8.6–12.4)
Monocytes Relative: 5 % (ref 3–12)
NEUTROS ABS: 7.8 10*3/uL — AB (ref 1.7–7.7)
Neutrophils Relative %: 76 % (ref 43–77)
Platelets: 188 10*3/uL (ref 150–400)
RBC: 5.39 MIL/uL (ref 4.22–5.81)
RDW: 14.5 % (ref 11.5–15.5)
WBC: 10.2 10*3/uL (ref 4.0–10.5)

## 2014-11-07 LAB — MYOCARDIAL PERFUSION IMAGING
CHL CUP NUCLEAR SSS: 19
CHL CUP RESTING HR STRESS: 71 {beats}/min
CHL RATE OF PERCEIVED EXERTION: 15
CSEPED: 5 min
Estimated workload: 7 METS
LV dias vol: 104 mL
LV sys vol: 63 mL
MPHR: 149 {beats}/min
NUC STRESS TID: 1.01
Peak HR: 148 {beats}/min
Percent HR: 99 %
SDS: 8
SRS: 11

## 2014-11-07 MED ORDER — TECHNETIUM TC 99M SESTAMIBI GENERIC - CARDIOLITE
32.0000 | Freq: Once | INTRAVENOUS | Status: AC | PRN
  Administered 2014-11-07: 32 via INTRAVENOUS

## 2014-11-07 MED ORDER — TECHNETIUM TC 99M SESTAMIBI GENERIC - CARDIOLITE
10.5000 | Freq: Once | INTRAVENOUS | Status: AC | PRN
  Administered 2014-11-07: 11 via INTRAVENOUS

## 2014-11-07 NOTE — Progress Notes (Signed)
Dr Sallyanne Kuster reviewed Cardiolite study and spoke to Benjamin Barr regarding his test. Benjamin Barr is scheduled for a cath per Dr C. Delight Hoh A

## 2014-11-08 LAB — BASIC METABOLIC PANEL
BUN: 16 mg/dL (ref 7–25)
CALCIUM: 9.5 mg/dL (ref 8.6–10.3)
CHLORIDE: 100 mmol/L (ref 98–110)
CO2: 24 mmol/L (ref 20–31)
Creat: 1.07 mg/dL (ref 0.70–1.18)
Glucose, Bld: 170 mg/dL — ABNORMAL HIGH (ref 65–99)
Potassium: 3.6 mmol/L (ref 3.5–5.3)
SODIUM: 138 mmol/L (ref 135–146)

## 2014-11-08 LAB — PROTIME-INR
INR: 1.03 (ref <1.50)
Prothrombin Time: 13.6 seconds (ref 11.6–15.2)

## 2014-11-13 ENCOUNTER — Ambulatory Visit (HOSPITAL_COMMUNITY)
Admission: RE | Admit: 2014-11-13 | Discharge: 2014-11-14 | Disposition: A | Discharge status: Home / Self Care | Payer: Medicare Other | Source: Ambulatory Visit | Attending: Cardiology | Admitting: Cardiology

## 2014-11-13 ENCOUNTER — Encounter (HOSPITAL_COMMUNITY)
Admission: RE | Disposition: A | Discharge status: Home / Self Care | Payer: Self-pay | Source: Ambulatory Visit | Attending: Cardiology

## 2014-11-13 ENCOUNTER — Encounter (HOSPITAL_COMMUNITY): Payer: Self-pay | Admitting: Cardiology

## 2014-11-13 DIAGNOSIS — I25110 Atherosclerotic heart disease of native coronary artery with unstable angina pectoris: Secondary | ICD-10-CM | POA: Insufficient documentation

## 2014-11-13 DIAGNOSIS — E113299 Type 2 diabetes mellitus with mild nonproliferative diabetic retinopathy without macular edema, unspecified eye: Secondary | ICD-10-CM | POA: Insufficient documentation

## 2014-11-13 DIAGNOSIS — E785 Hyperlipidemia, unspecified: Secondary | ICD-10-CM | POA: Diagnosis present

## 2014-11-13 DIAGNOSIS — Z9681 Presence of artificial skin: Secondary | ICD-10-CM | POA: Diagnosis not present

## 2014-11-13 DIAGNOSIS — E1165 Type 2 diabetes mellitus with hyperglycemia: Secondary | ICD-10-CM | POA: Diagnosis not present

## 2014-11-13 DIAGNOSIS — Z23 Encounter for immunization: Secondary | ICD-10-CM | POA: Diagnosis not present

## 2014-11-13 DIAGNOSIS — Z8546 Personal history of malignant neoplasm of prostate: Secondary | ICD-10-CM | POA: Insufficient documentation

## 2014-11-13 DIAGNOSIS — E876 Hypokalemia: Secondary | ICD-10-CM | POA: Insufficient documentation

## 2014-11-13 DIAGNOSIS — M353 Polymyalgia rheumatica: Secondary | ICD-10-CM | POA: Diagnosis not present

## 2014-11-13 DIAGNOSIS — Z9861 Coronary angioplasty status: Secondary | ICD-10-CM

## 2014-11-13 DIAGNOSIS — M069 Rheumatoid arthritis, unspecified: Secondary | ICD-10-CM | POA: Diagnosis not present

## 2014-11-13 DIAGNOSIS — I2510 Atherosclerotic heart disease of native coronary artery without angina pectoris: Secondary | ICD-10-CM | POA: Diagnosis not present

## 2014-11-13 DIAGNOSIS — I10 Essential (primary) hypertension: Secondary | ICD-10-CM | POA: Diagnosis not present

## 2014-11-13 DIAGNOSIS — E669 Obesity, unspecified: Secondary | ICD-10-CM | POA: Diagnosis present

## 2014-11-13 DIAGNOSIS — I200 Unstable angina: Secondary | ICD-10-CM

## 2014-11-13 DIAGNOSIS — Z794 Long term (current) use of insulin: Secondary | ICD-10-CM | POA: Insufficient documentation

## 2014-11-13 DIAGNOSIS — E1140 Type 2 diabetes mellitus with diabetic neuropathy, unspecified: Secondary | ICD-10-CM | POA: Diagnosis not present

## 2014-11-13 DIAGNOSIS — Z7982 Long term (current) use of aspirin: Secondary | ICD-10-CM | POA: Insufficient documentation

## 2014-11-13 DIAGNOSIS — I209 Angina pectoris, unspecified: Secondary | ICD-10-CM | POA: Insufficient documentation

## 2014-11-13 DIAGNOSIS — Z7902 Long term (current) use of antithrombotics/antiplatelets: Secondary | ICD-10-CM | POA: Insufficient documentation

## 2014-11-13 DIAGNOSIS — R9439 Abnormal result of other cardiovascular function study: Secondary | ICD-10-CM | POA: Diagnosis present

## 2014-11-13 DIAGNOSIS — Z955 Presence of coronary angioplasty implant and graft: Secondary | ICD-10-CM

## 2014-11-13 HISTORY — PX: CARDIAC CATHETERIZATION: SHX172

## 2014-11-13 HISTORY — DX: Atherosclerotic heart disease of native coronary artery without angina pectoris: I25.10

## 2014-11-13 LAB — POCT ACTIVATED CLOTTING TIME: ACTIVATED CLOTTING TIME: 571 s

## 2014-11-13 LAB — GLUCOSE, CAPILLARY
GLUCOSE-CAPILLARY: 150 mg/dL — AB (ref 65–99)
Glucose-Capillary: 87 mg/dL (ref 65–99)
Glucose-Capillary: 222 mg/dL — ABNORMAL HIGH (ref 65–99)
Glucose-Capillary: 278 mg/dL — ABNORMAL HIGH (ref 65–99)

## 2014-11-13 SURGERY — LEFT HEART CATH AND CORONARY ANGIOGRAPHY

## 2014-11-13 MED ORDER — HEPARIN SODIUM (PORCINE) 1000 UNIT/ML IJ SOLN
INTRAMUSCULAR | Status: DC | PRN
  Administered 2014-11-13: 5000 [IU] via INTRAVENOUS

## 2014-11-13 MED ORDER — MIDAZOLAM HCL 2 MG/2ML IJ SOLN
INTRAMUSCULAR | Status: DC | PRN
  Administered 2014-11-13: 2 mg via INTRAVENOUS
  Administered 2014-11-13: 1 mg via INTRAVENOUS

## 2014-11-13 MED ORDER — HEPARIN SODIUM (PORCINE) 1000 UNIT/ML IJ SOLN
INTRAMUSCULAR | Status: AC
  Filled 2014-11-13: qty 1

## 2014-11-13 MED ORDER — LIDOCAINE HCL (PF) 1 % IJ SOLN
INTRAMUSCULAR | Status: AC
  Filled 2014-11-13: qty 30

## 2014-11-13 MED ORDER — LIDOCAINE HCL (PF) 1 % IJ SOLN
INTRAMUSCULAR | Status: DC | PRN
  Administered 2014-11-13: 5 mL

## 2014-11-13 MED ORDER — ASPIRIN 81 MG PO CHEW: 81.0000 mg | CHEWABLE_TABLET | ORAL | Status: DC

## 2014-11-13 MED ORDER — SODIUM CHLORIDE 0.9 % WEIGHT BASED INFUSION
3.0000 mL/kg/h | INTRAVENOUS | Status: AC
  Administered 2014-11-13: 16:00:00 3 mL/kg/h via INTRAVENOUS

## 2014-11-13 MED ORDER — ONDANSETRON HCL 4 MG/2ML IJ SOLN: 4.0000 mg | Freq: Four times a day (QID) | INTRAMUSCULAR | Status: DC | PRN

## 2014-11-13 MED ORDER — INSULIN GLARGINE 100 UNIT/ML ~~LOC~~ SOLN
50.0000 [IU] | Freq: Every day | SUBCUTANEOUS | Status: DC
  Administered 2014-11-14: 50 [IU] via SUBCUTANEOUS
  Filled 2014-11-13: qty 0.5

## 2014-11-13 MED ORDER — FERROUS FUMARATE 325 (106 FE) MG PO TABS
1.0000 | ORAL_TABLET | Freq: Every day | ORAL | Status: DC
  Administered 2014-11-13: 106 mg via ORAL
  Filled 2014-11-13 (×2): qty 1

## 2014-11-13 MED ORDER — HYDROCHLOROTHIAZIDE 25 MG PO TABS
25.0000 mg | ORAL_TABLET | Freq: Every day | ORAL | Status: DC
  Administered 2014-11-13 – 2014-11-14 (×2): 25 mg via ORAL
  Filled 2014-11-13 (×2): qty 1

## 2014-11-13 MED ORDER — NITROGLYCERIN 1 MG/10 ML FOR IR/CATH LAB
INTRA_ARTERIAL | Status: AC
  Filled 2014-11-13: qty 10

## 2014-11-13 MED ORDER — SODIUM CHLORIDE 0.9 % IJ SOLN
3.0000 mL | Freq: Two times a day (BID) | INTRAMUSCULAR | Status: DC
  Administered 2014-11-13: 16:00:00 10 mL via INTRAVENOUS

## 2014-11-13 MED ORDER — ANGIOPLASTY BOOK
Freq: Once | Status: AC
  Administered 2014-11-13: 21:00:00
  Filled 2014-11-13: qty 1

## 2014-11-13 MED ORDER — INSULIN GLARGINE 100 UNIT/ML ~~LOC~~ SOLN
80.0000 [IU] | Freq: Every day | SUBCUTANEOUS | Status: DC
  Administered 2014-11-13: 21:00:00 80 [IU] via SUBCUTANEOUS
  Filled 2014-11-13 (×2): qty 0.8

## 2014-11-13 MED ORDER — VITAMIN C 250 MG PO TABS
500.0000 mg | ORAL_TABLET | Freq: Every day | ORAL | Status: DC
  Administered 2014-11-14: 500 mg via ORAL
  Filled 2014-11-13 (×2): qty 2

## 2014-11-13 MED ORDER — MIDAZOLAM HCL 2 MG/2ML IJ SOLN
INTRAMUSCULAR | Status: AC
  Filled 2014-11-13: qty 4

## 2014-11-13 MED ORDER — BIVALIRUDIN 250 MG IV SOLR
INTRAVENOUS | Status: AC
  Filled 2014-11-13: qty 250

## 2014-11-13 MED ORDER — SODIUM CHLORIDE 0.9 % IJ SOLN: 3.0000 mL | INTRAMUSCULAR | Status: DC | PRN

## 2014-11-13 MED ORDER — INSULIN ASPART 100 UNIT/ML ~~LOC~~ SOLN
0.0000 [IU] | Freq: Three times a day (TID) | SUBCUTANEOUS | Status: DC
  Administered 2014-11-13: 18:00:00 5 [IU] via SUBCUTANEOUS
  Administered 2014-11-14: 2 [IU] via SUBCUTANEOUS

## 2014-11-13 MED ORDER — SODIUM CHLORIDE 0.9 % IJ SOLN: 3.0000 mL | Freq: Two times a day (BID) | INTRAMUSCULAR | Status: DC

## 2014-11-13 MED ORDER — FENTANYL CITRATE (PF) 100 MCG/2ML IJ SOLN
INTRAMUSCULAR | Status: DC | PRN
  Administered 2014-11-13 (×2): 25 ug via INTRAVENOUS

## 2014-11-13 MED ORDER — ASPIRIN EC 81 MG PO TBEC
81.0000 mg | DELAYED_RELEASE_TABLET | Freq: Every day | ORAL | Status: DC
Start: 2014-11-14 — End: 2014-11-14
  Administered 2014-11-14: 81 mg via ORAL
  Filled 2014-11-13: qty 1

## 2014-11-13 MED ORDER — POTASSIUM CHLORIDE CRYS ER 20 MEQ PO TBCR
20.0000 meq | EXTENDED_RELEASE_TABLET | Freq: Every day | ORAL | Status: DC
  Administered 2014-11-13 – 2014-11-14 (×2): 20 meq via ORAL
  Filled 2014-11-13 (×2): qty 1

## 2014-11-13 MED ORDER — BIVALIRUDIN BOLUS VIA INFUSION - CUPID
INTRAVENOUS | Status: DC | PRN
  Administered 2014-11-13: 69.375 mg via INTRAVENOUS

## 2014-11-13 MED ORDER — ASPIRIN 81 MG PO CHEW
CHEWABLE_TABLET | ORAL | Status: AC
  Filled 2014-11-13: qty 1

## 2014-11-13 MED ORDER — SIMVASTATIN 20 MG PO TABS
40.0000 mg | ORAL_TABLET | Freq: Every day | ORAL | Status: DC
  Administered 2014-11-13: 21:00:00 40 mg via ORAL
  Filled 2014-11-13: qty 2

## 2014-11-13 MED ORDER — IOHEXOL 350 MG/ML SOLN
INTRAVENOUS | Status: DC | PRN
  Administered 2014-11-13: 245 mL via INTRACARDIAC

## 2014-11-13 MED ORDER — SODIUM CHLORIDE 0.9 % IV SOLN: 250.0000 mL | INTRAVENOUS | Status: DC | PRN

## 2014-11-13 MED ORDER — ACETAMINOPHEN 325 MG PO TABS
650.0000 mg | ORAL_TABLET | ORAL | Status: DC | PRN
  Administered 2014-11-13: 17:00:00 650 mg via ORAL
  Filled 2014-11-13: qty 2

## 2014-11-13 MED ORDER — SODIUM CHLORIDE 0.9 % WEIGHT BASED INFUSION
3.0000 mL/kg/h | INTRAVENOUS | Status: DC
  Administered 2014-11-13: 3 mL/kg/h via INTRAVENOUS

## 2014-11-13 MED ORDER — SODIUM CHLORIDE 0.9 % IV SOLN
250.0000 mg | INTRAVENOUS | Status: DC | PRN
  Administered 2014-11-13: 1.75 mg/kg/h via INTRAVENOUS

## 2014-11-13 MED ORDER — VERAPAMIL HCL 2.5 MG/ML IV SOLN
INTRAVENOUS | Status: DC | PRN
  Administered 2014-11-13: 10 mL via INTRA_ARTERIAL

## 2014-11-13 MED ORDER — CLOPIDOGREL BISULFATE 75 MG PO TABS
75.0000 mg | ORAL_TABLET | Freq: Every day | ORAL | Status: DC
  Administered 2014-11-14: 11:00:00 75 mg via ORAL
  Filled 2014-11-13: qty 1

## 2014-11-13 MED ORDER — ASPIRIN 81 MG PO TABS: 81.0000 mg | ORAL_TABLET | Freq: Every day | ORAL | Status: DC

## 2014-11-13 MED ORDER — SODIUM CHLORIDE 0.9 % WEIGHT BASED INFUSION: 1.0000 mL/kg/h | INTRAVENOUS | Status: DC

## 2014-11-13 MED ORDER — FENTANYL CITRATE (PF) 100 MCG/2ML IJ SOLN
INTRAMUSCULAR | Status: AC
  Filled 2014-11-13: qty 4

## 2014-11-13 MED ORDER — VERAPAMIL HCL 2.5 MG/ML IV SOLN
INTRAVENOUS | Status: AC
  Filled 2014-11-13: qty 2

## 2014-11-13 MED ORDER — HEPARIN (PORCINE) IN NACL 2-0.9 UNIT/ML-% IJ SOLN
INTRAMUSCULAR | Status: AC
  Filled 2014-11-13: qty 1000

## 2014-11-13 MED ORDER — INFLUENZA VAC SPLIT QUAD 0.5 ML IM SUSY
0.5000 mL | PREFILLED_SYRINGE | INTRAMUSCULAR | Status: AC
  Administered 2014-11-14: 0.5 mL via INTRAMUSCULAR
  Filled 2014-11-13: qty 0.5

## 2014-11-13 MED ORDER — NITROGLYCERIN 1 MG/10 ML FOR IR/CATH LAB
INTRA_ARTERIAL | Status: DC | PRN
  Administered 2014-11-13: 14:00:00

## 2014-11-13 SURGICAL SUPPLY — 25 items
BALLN EMERGE MR 2.0X12 (BALLOONS) ×4
BALLN EMERGE MR 2.5X12 (BALLOONS) ×4
BALLN ~~LOC~~ EMERGE MR 3.25X12 (BALLOONS) ×4
BALLN ~~LOC~~ EMERGE MR 3.5X8 (BALLOONS) ×2 IMPLANT
BALLOON EMERGE MR 2.0X12 (BALLOONS) IMPLANT
BALLOON EMERGE MR 2.5X12 (BALLOONS) IMPLANT
BALLOON ~~LOC~~ EMERGE MR 3.25X12 (BALLOONS) IMPLANT
CATH INFINITI 5 FR JL3.5 (CATHETERS) ×4 IMPLANT
CATH INFINITI 5FR ANG PIGTAIL (CATHETERS) ×4 IMPLANT
CATH INFINITI JR4 5F (CATHETERS) ×4 IMPLANT
DEVICE RAD COMP TR BAND LRG (VASCULAR PRODUCTS) ×4 IMPLANT
GLIDESHEATH SLEND SS 6F .021 (SHEATH) ×4 IMPLANT
GUIDE CATH RUNWAY 6FR AL 1 (CATHETERS) ×2 IMPLANT
GUIDE CATH RUNWAY 6FR CLS3.5 (CATHETERS) ×2 IMPLANT
KIT ENCORE 26 ADVANTAGE (KITS) ×2 IMPLANT
KIT HEART LEFT (KITS) ×4 IMPLANT
PACK CARDIAC CATHETERIZATION (CUSTOM PROCEDURE TRAY) ×4 IMPLANT
STENT PROMUS PREM MR 3.0X12 (Permanent Stent) ×4 IMPLANT
STENT SYNERGY DES 2.25X12 (Permanent Stent) ×2 IMPLANT
SYR MEDRAD MARK V 150ML (SYRINGE) ×4 IMPLANT
TRANSDUCER W/STOPCOCK (MISCELLANEOUS) ×4 IMPLANT
TUBING CIL FLEX 10 FLL-RA (TUBING) ×4 IMPLANT
WIRE ASAHI PROWATER 180CM (WIRE) ×4 IMPLANT
WIRE HI TORQ BMW 190CM (WIRE) ×2 IMPLANT
WIRE SAFE-T 1.5MM-J .035X260CM (WIRE) ×4 IMPLANT

## 2014-11-13 NOTE — H&P (View-Only) (Signed)
PCP: Delrae Rend, MD  Clinic Note: Chief Complaint  Patient presents with  . arm pain    left arm pain,    HPI: Benjamin Barr is a 69 y.o. male with a PMH below who presents today for urgent evaluation for chest tightness & arm pain. Pt of Dr. [name] w/ PMH of CAD w/ Taxus DES to LCx & RCA in 2008, rheumatoid arthritis (& new Dx of PMR), type II DM, prostate CA.   His last stress test in Dec 2013 was normal. EF 54%.  Benjamin Barr was last seen in Dec 2015 by Dr. [name].  Recent Hospitalizations: Feb 2016 - for Prostate Sgx.; Recent Dx with Poly Myalgia Rheumatica -- on prednisone.  Studies Reviewed:   Echo April 2016: Mild LVH. EF 60-65%, no RWMA, Gr 1 DD, Mod Ao Sclerosis - no stenosis. Mild AI.  Interval History: Usually does ~2 hr walk with dog.   Over past ~week or so, would note discomfort starting distally & moving proximal to axilla -- felt unusual, sort of numb.  Over the past few days, he has started to note the discomfort across his chest. Goes away with rest, but is usually ok if he keeps walking. Self limiting - regardless of walking or not.  Not sure if this is related to having recently stopped Prednisone for PMR Chest discomfort is often associated with dyspnea but also legally rest. No resting dyspnea. No CHF symptoms of PND, orthopnea or edema.  No palpitations, lightheadedness, dizziness, weakness or syncope/near syncope. No TIA/amaurosis fugax symptoms.  ROS: A comprehensive was performed. Review of Systems  Constitutional: Positive for malaise/fatigue.  HENT: Negative for nosebleeds.   Respiratory: Positive for shortness of breath.   Cardiovascular: Negative for palpitations and claudication.  Gastrointestinal: Negative for heartburn, abdominal pain, blood in stool and melena.  Genitourinary: Negative for hematuria.  Musculoskeletal: Positive for myalgias and joint pain.       Bilateral arms shoulders and back discomfort.  Neurological: Positive for  dizziness and weakness (Generalized weakness from PMR). Negative for focal weakness.  Endo/Heme/Allergies: Does not bruise/bleed easily.  Psychiatric/Behavioral: Negative.   All other systems reviewed and are negative.   Past Medical History  Diagnosis Date  . Hypertension   . Hyperlipidemia   . Obesity   . Prostate cancer   . CAD S/P percutaneous coronary angioplasty 2008    CARDIOLOGIST-- DR [name]: stent of RCA, LCX  with DES 2008  . S/P drug eluting coronary stent placement 2008    x3 Taxus DES  to mRCA, prox & mid LCFX in 2008  . Peripheral neuropathy   . Type 2 diabetes mellitus   . History of colon polyps     2014--  hyperplastic, adenoma, and benign polyps  . Diverticulosis of colon   . Nonproliferative diabetic retinopathy   . RBBB (right bundle branch block)     3 chronic     Past Surgical History  Procedure Laterality Date  . Appendectomy    . Partial colectomy    . Cardiovascular stress test  01-17-2012  dr [name]    Normal perfusion study/ no ischemia or infarction/  normal LVF and wall motion, ef 54%  . Coronary angioplasty with stent placement  05/04/2006   dr [name]    Severe 2-vessel obstructive CAD/  normal LVF/  PCI with DES to proxima and mid CFX and mRCA (total 3 DES)  . Cryoablation N/A 04/01/2014    Procedure: CRYO ABLATION PROSTATE;  Surgeon: Pierre Bali  Joya San, MD;  Location: [facility];  Service: Urology;  Laterality: N/A;  . Insertion of suprapubic catheter N/A 04/01/2014    Procedure: INSERTION OF SUPRAPUBIC CATHETER;  Surgeon: [name], MD;  Location: [facility];  Service: Urology;  Laterality: N/A;  suprapubic     Prior to Admission medications   Medication Sig Start Date End Date Taking? Authorizing Provider  ascorbic Acid (VITAMIN C) 500 MG CPCR Take 500 mg by mouth daily.   Yes Historical Provider, MD  aspirin 81 MG tablet Take 81 mg by mouth daily.    Yes Historical Provider, MD  clopidogrel  (PLAVIX) 75 MG tablet Take 75 mg by mouth daily.   Yes Historical Provider, MD  ferrous fumarate (HEMOCYTE - 106 MG FE) 325 (106 FE) MG TABS tablet Take 1 tablet by mouth daily.   Yes Historical Provider, MD  hydrochlorothiazide (HYDRODIURIL) 25 MG tablet Take 25 mg by mouth daily.     Yes Historical Provider, MD  insulin glargine (LANTUS) 100 UNIT/ML injection Inject 150 Units into the skin at bedtime.    Yes Historical Provider, MD  KLOR-CON M20 20 MEQ tablet Take 20 mEq by mouth daily. 10/24/14  Yes Historical Provider, MD  simvastatin (ZOCOR) 40 MG tablet Take 40 mg by mouth at bedtime. 10/19/13  Yes Historical Provider, MD  trimethoprim (TRIMPEX) 100 MG tablet Take 1 tablet (100 mg total) by mouth 1 day or 1 dose. 04/01/14  Yes Carolan Clines, MD   No Known Allergies   Social History   Social History  . Marital Status: Married    Spouse Name: N/A  . Number of Children: N/A  . Years of Education: N/A   Social History Main Topics  . Smoking status: Never Smoker   . Smokeless tobacco: None  . Alcohol Use: No  . Drug Use: No  . Sexual Activity: Not Asked   Other Topics Concern  . None   Social History Narrative   Family History  Problem Relation Age of Onset  . Diverticulitis Mother   . Heart failure Father     Wt Readings from Last 3 Encounters:  10/31/14 208 lb 8 oz (94.575 kg)  04/01/14 208 lb (94.348 kg)  01/18/14 207 lb 8 oz (94.121 kg)    PHYSICAL EXAM BP 128/68 mmHg  Pulse 93  Ht 5\' 8"  (1.727 m)  Wt 208 lb 8 oz (94.575 kg)  BMI 31.71 kg/m2 General appearance: alert, cooperative, appears stated age, no distress and mildly obese; well-nourished well-groomed. Neck: no adenopathy, no carotid bruit and no JVD Lungs: clear to auscultation bilaterally, normal percussion bilaterally and non-labored Heart: regular rate and rhythm, S1, S2 normal, no murmur, click, rub or gallop; nondisplaced PMI. Abdomen: soft, non-tender; bowel sounds normal; no masses,  no  organomegaly; no HJR Extremities: extremities normal, atraumatic, no cyanosis, or edema Pulses: 2+ and symmetric; Skin: mobility and turgor normal and Warm and dry  Neurologic: Mental status: Alert, oriented, thought content appropriate Cranial nerves: normal (II-XII grossly intact)    Adult ECG Report - from PCP's office  Rate: 82 ;  Rhythm: normal sinus rhythm; RBBB - with otherwise normal. Duration, axis and intervals.  Narrative Interpretation: stable EKG   Other studies Reviewed: Additional studies/ records that were reviewed today include:  Recent Labs:  Followed by PCP    ASSESSMENT / PLAN: Problem List Items Addressed This Visit    CAD S/P percutaneous coronary angioplasty - Primary (Chronic)    He  is on Plavix and statin only now. No longer on beta blocker, ACE-I or ARB. I'm not. These symptoms are currently anginal in nature or not. Has not had an ischemic evaluation since 2013.  Plan: Lexiscan Myoview. -- would consider beta blocker, especially if stress test is abnormal.      Relevant Orders   Myocardial Perfusion Imaging   Chest pain with moderate risk for cardiac etiology (Chronic)    Given his history of coronary disease and apparently has exertional chest discomfort that has some features typical for recurrent exertional angina, we need to exclude an ischemic etiology. He never really had classic symptoms in the past, but mostly dyspnea but was noted to his PCI.  He did note some exertional dyspnea as well. Therefore we will evaluate with Myoview stress test.  Plan:Treadmill Myoview      Relevant Orders   Myocardial Perfusion Imaging   DM (diabetes mellitus), type 2, uncontrolled   Relevant Orders   Myocardial Perfusion Imaging   Hyperlipidemia    On statin. Needs to work on diet and exercise. Labs unavailable      PMR (polymyalgia rheumatica) (Chronic)    Was well controlled on prednisone. Now not only on prednisone. It is quite possible this current  symptoms are related to PMR as opposed to angina.   Myoview to exclude angina.  This will allow his Rheumatologist to better understand his symptoms.         Current medicines are reviewed at length with the patient today. (+/- concerns) none The following changes have been made: none  Studies Ordered:   Orders Placed This Encounter  Procedures  . Myocardial Perfusion Imaging    ROV with Dr. [name] following Myoview   HARDING, Leonie Green, M.D., M.S. Interventional Cardiologist   Pager # [phone]

## 2014-11-13 NOTE — Progress Notes (Signed)
TR band deflation in progress.  1st attempt to remove air led to immediate bleeding.  Air back in and site monitored.  Now has 8 cc air in it without bleeding or swelling.  Reminded pt frequently to call RN for any bleeding or swelling.  Call light in reach. Tylenol given for HA w/ relief.  Denies complaints now.  CBG 222.  Text page sent to [facility] requesting SS insulin.  Pt had half dose of insulin this am and due for it again at HS.

## 2014-11-13 NOTE — Interval H&P Note (Signed)
History and Physical Interval Note:  11/13/2014 12:34 PM  Benjamin Barr  has presented today for surgery, with the diagnosis of anormal mioview  The various methods of treatment have been discussed with the patient and family. After consideration of risks, benefits and other options for treatment, the patient has consented to  Procedure(s): Left Heart Cath and Coronary Angiography (N/A) as a surgical intervention .  The patient's history has been reviewed, patient examined, no change in status, stable for surgery.  I have reviewed the patient's chart and labs.  Questions were answered to the patient's satisfaction.   Cath Lab Visit (complete for each Cath Lab visit)  Clinical Evaluation Leading to the Procedure:   ACS: No.  Non-ACS:    Anginal Classification: CCS III  Anti-ischemic medical therapy: No Therapy  Non-Invasive Test Results: High-risk stress test findings: cardiac mortality >3%/year  Prior CABG: No previous CABG        [name] [facility] 11/13/2014 12:34 PM

## 2014-11-14 ENCOUNTER — Encounter (HOSPITAL_COMMUNITY): Payer: Self-pay | Admitting: Nurse Practitioner

## 2014-11-14 DIAGNOSIS — Z23 Encounter for immunization: Secondary | ICD-10-CM | POA: Diagnosis not present

## 2014-11-14 DIAGNOSIS — Z8546 Personal history of malignant neoplasm of prostate: Secondary | ICD-10-CM | POA: Diagnosis not present

## 2014-11-14 DIAGNOSIS — R931 Abnormal findings on diagnostic imaging of heart and coronary circulation: Secondary | ICD-10-CM | POA: Diagnosis not present

## 2014-11-14 DIAGNOSIS — M069 Rheumatoid arthritis, unspecified: Secondary | ICD-10-CM | POA: Diagnosis not present

## 2014-11-14 DIAGNOSIS — I200 Unstable angina: Secondary | ICD-10-CM

## 2014-11-14 DIAGNOSIS — I25110 Atherosclerotic heart disease of native coronary artery with unstable angina pectoris: Secondary | ICD-10-CM | POA: Diagnosis not present

## 2014-11-14 LAB — URINALYSIS, ROUTINE W REFLEX MICROSCOPIC
Bilirubin Urine: NEGATIVE
KETONES UR: 15 mg/dL — AB
Leukocytes, UA: NEGATIVE
Nitrite: NEGATIVE
PROTEIN: 100 mg/dL — AB
Specific Gravity, Urine: 1.024 (ref 1.005–1.030)
Urobilinogen, UA: 1 mg/dL (ref 0.0–1.0)
pH: 5.5 (ref 5.0–8.0)

## 2014-11-14 LAB — BASIC METABOLIC PANEL
Anion gap: 8 (ref 5–15)
BUN: 13 mg/dL (ref 6–20)
CALCIUM: 9.1 mg/dL (ref 8.9–10.3)
CO2: 30 mmol/L (ref 22–32)
CREATININE: 1.07 mg/dL (ref 0.61–1.24)
Chloride: 100 mmol/L — ABNORMAL LOW (ref 101–111)
GFR calc Af Amer: >60 mL/min (ref >60)
GFR calc non Af Amer: >60 mL/min (ref >60)
GLUCOSE: 161 mg/dL — AB (ref 65–99)
Potassium: 3.3 mmol/L — ABNORMAL LOW (ref 3.5–5.1)
Sodium: 138 mmol/L (ref 135–145)

## 2014-11-14 LAB — CBC
HCT: 44.1 % (ref 39.0–52.0)
Hemoglobin: 14.6 g/dL (ref 13.0–17.0)
MCH: 28 pg (ref 26.0–34.0)
MCHC: 33.1 g/dL (ref 30.0–36.0)
MCV: 84.5 fL (ref 78.0–100.0)
PLATELETS: 176 10*3/uL (ref 150–400)
RBC: 5.22 MIL/uL (ref 4.22–5.81)
RDW: 13.8 % (ref 11.5–15.5)
WBC: 8.9 10*3/uL (ref 4.0–10.5)

## 2014-11-14 LAB — URINE MICROSCOPIC-ADD ON

## 2014-11-14 LAB — GLUCOSE, CAPILLARY: Glucose-Capillary: 145 mg/dL — ABNORMAL HIGH (ref 65–99)

## 2014-11-14 MED ORDER — POTASSIUM CHLORIDE CRYS ER 20 MEQ PO TBCR
40.0000 meq | EXTENDED_RELEASE_TABLET | Freq: Once | ORAL | Status: AC
  Administered 2014-11-14: 09:00:00 40 meq via ORAL
  Filled 2014-11-14: qty 2

## 2014-11-14 MED ORDER — LISINOPRIL 5 MG PO TABS
5.0000 mg | ORAL_TABLET | Freq: Every day | ORAL | Status: DC
  Administered 2014-11-14: 5 mg via ORAL
  Filled 2014-11-14: qty 1

## 2014-11-14 MED ORDER — NITROGLYCERIN 0.4 MG SL SUBL: 0.4000 mg | SUBLINGUAL_TABLET | SUBLINGUAL | Status: DC | PRN

## 2014-11-14 MED ORDER — LISINOPRIL 5 MG PO TABS: 5.0000 mg | ORAL_TABLET | Freq: Every day | ORAL | Status: DC

## 2014-11-14 NOTE — Discharge Summary (Signed)
Discharge Summary   Patient ID: Benjamin Barr,  MRN: [id number], DOB/AGE: 07/29/1945 69 y.o.  Admit date: 11/13/2014 Discharge date: 11/14/2014  Primary Care Provider: KERR,JEFFREY Primary Cardiologist: [name], MD   Discharge Diagnoses Principal Problem:   Unstable angina ([place])  **S/P PCI/DES to the LCX and RCA and PTCA of the RPDA this admission.  Active Problems:   CAD S/P percutaneous coronary angioplasty   Abnormal nuclear stress test   Hypertension   Obesity   DM (diabetes mellitus), type 2, uncontrolled ([facility])   Hyperlipidemia   PMR (polymyalgia rheumatica) (H[facility])   H/O Prostate Cancer   Hypokalemia  Allergies No Known Allergies  Diagnostic Studies/Procedures   Cardiac Catheterization and Percutaneous Coronary Intervention 11/13/2014  Coronary Findings     Dominance: Right    Left Main  The vessel was injected is normal in caliber is angiographically normal.      Left Anterior Descending   . Ost LAD to Prox LAD lesion, 30% stenosed. calcified diffuse .   [name] Dist LAD lesion, 80% stenosed. discrete .      Left Circumflex   . Ost Cx lesion, 90% stenosed. calcified discrete .   [name] PCI: Successfully treated with a 3.0 x 12 mm Promus Premier DES  . There is no residual stenosis post intervention.     Colon Flattery Cx to Prox Cx lesion, 10% stenosed. The lesion was previously treated with a drug-eluting stent greater than two years ago.      . Second Obtuse Marginal Branch   . 2nd Mrg lesion, 30% stenosed.      Right Coronary Artery   . Mid RCA-1 lesion, 90% stenosed. calcified discrete .   [name] PCI: Successfully treated with a 3.0 x 12 mm Promus Premier DES.  [name] There is no residual stenosis post intervention.     . Mid RCA-2 lesion, 10% stenosed. The lesion was previously treated with a drug-eluting stent greater than two years ago.   . Right Posterior Descending Artery   . RPDA lesion, 90% stenosed. discrete .   [name] Angioplasty: Successfully treated with PTCA only.  .  There is a 10% residual stenosis post intervention.       Left Heart     Left Ventricle The left ventricular size is normal. The left ventricular systolic function is normal. The left ventricular ejection fraction is 55-65% by visual estimate. There are wall motion abnormalities in the left ventricle. mild mid inferior hypokinesis There are segmental wall motion abnormalities in the left ventricle.    Mitral Valve There is no mitral valve regurgitation.  _____________   History of Present Illness  69 y/o male with a h/o CAD s/p PCI of the RCA and LCX in 2008, HTN, HL, DM, obesity, prostate CA, and PMR.  He recently developed exertional chest tightness prompting outpatient cardiology visit and stress testing, which subsequently revealed extensive ischemia in the RCA territory and small area of anterior ischemia.  As a result, he was seen back in clinic and arrangements were made for outpatient diagnostic catheterization.  Hospital Course  Consultants: None   Patient presented to the [facility] cardiac catheterization laboratory on 11/13/2014 and underwent diagnostic catheterization revealing severe ostial and proximal LCX disease along with mid RCA and RPDA disease.  Both the LCX and mid RCA were successfully treated with Promus Premier DES'.  The RPDA was treated with balloon angioplasty only.  Post-procedure, [name] has had intermittent, fleeting, "twinges" of chest discomfort but has not had  any chest tightness and has been ambulating without difficulty.  ECG is stable.  His BP has been moderately elevated and in the setting of DM, we have added low dose acei (which he was on previously).  He has also reported a several wk h/o mild left flank tenderness.  In the setting of h/o prostate CA s/p surgery in February and complicated by UTI, we have sent off a UA, which is pending at this time.  [name] will be d/c'd home today in good condition.  We will see him back in clinic in 1 week and  will repeat a BMET at that time. _____________  Discharge Vitals Blood pressure 164/64, pulse 80, temperature 98.1 F (36.7 C), temperature source Oral, resp. rate 15, height 5\' 8"  (1.727 m), weight 205 lb 0.4 oz (93 kg), SpO2 97 %.  Filed Weights   11/13/14 1033 11/14/14 0252  Weight: 204 lb (92.534 kg) 205 lb 0.4 oz (93 kg)   _____________  Labs  CBC  Recent Labs  11/14/14 0352  WBC 8.9  HGB 14.6  HCT 44.1  MCV 84.5  PLT 315   Basic Metabolic Panel  Recent Labs  11/14/14 0352  NA 138  K 3.3*  CL 100*  CO2 30  GLUCOSE 161*  BUN 13  CREATININE 1.07  CALCIUM 9.1  ____________  Disposition  Pt is being discharged home today in good condition. _____________  Follow-up Plans, Appointments, and Instructions  Follow-up Information    Follow up with [facility], NP On 11/21/2014.   Specialties:  Cardiology, Radiology   Why:  10:30 PM - Dr. Doug Sou NP - [facility] location   Contact information:   [address] [place] [address] [phone]      Discharge Instructions    Amb Referral to Cardiac Rehabilitation    Complete by:  As directed   Congestive Heart Failure: If diagnosis is Heart Failure, patient MUST meet each of the CMS criteria: 1. Left Ventricular Ejection Fraction </= 35% 2. NYHA class II-IV symptoms despite being on optimal heart failure therapy for at least 6 weeks. 3. Stable = have not had a recent (<6 weeks) or planned (<6 months) major cardiovascular hospitalization or procedure  Program Details: - Physician supervised classes - 1-3 classes per week over a 12-18 week period, generally for a total of 36 sessions  Physician Certification: I certify that the above Cardiac Rehabilitation treatment is medically necessary and is medically approved by me for treatment of this patient. The patient is willing and cooperative, able to ambulate and medically stable to participate in exercise rehabilitation. The participant's  progress and Individualized Treatment Plan will be reviewed by the Medical Director, Cardiac Rehab staff and as indicated by the Referring/Ordering Physician.  Diagnosis:  PCI           Discharge Medications   Current Discharge Medication List    START taking these medications   Details  lisinopril (PRINIVIL,ZESTRIL) 5 MG tablet Take 1 tablet (5 mg total) by mouth daily. Qty: 30 tablet, Refills: 6    nitroGLYCERIN (NITROSTAT) 0.4 MG SL tablet Place 1 tablet (0.4 mg total) under the tongue every 5 (five) minutes as needed for chest pain. Qty: 25 tablet, Refills: 3      CONTINUE these medications which have NOT CHANGED   Details  ascorbic Acid (VITAMIN C) 500 MG CPCR Take 500 mg by mouth daily.    aspirin 81 MG tablet Take 81 mg by mouth daily.  clopidogrel (PLAVIX) 75 MG tablet Take 75 mg by mouth daily.    ferrous fumarate (HEMOCYTE - 106 MG FE) 325 (106 FE) MG TABS tablet Take 1 tablet by mouth daily.    hydrochlorothiazide (HYDRODIURIL) 25 MG tablet Take 25 mg by mouth daily.      insulin glargine (LANTUS) 100 UNIT/ML injection Inject 50-80 Units into the skin 2 (two) times daily. Inject 50 units subque in the morning and 80 units subque at bedtime    KLOR-CON M20 20 MEQ tablet Take 20 mEq by mouth daily. Refills: 5    oxymetazoline (AFRIN) 0.05 % nasal spray Place 2 sprays into both nostrils 2 (two) times daily as needed for congestion.    simvastatin (ZOCOR) 40 MG tablet Take 40 mg by mouth at bedtime. Refills: 3       _____________   Outstanding Labs/Studies  F/U BMET in 1 wk @ F/U appt.  Duration of Discharge Encounter   Greater than 30 minutes including physician time.  Signed, [name] NP 11/14/2014, 9:01 AM

## 2014-11-14 NOTE — Progress Notes (Signed)
TR BAND REMOVAL  LOCATION:    right radial  DEFLATED PER PROTOCOL:    Yes.    TIME BAND OFF / DRESSING APPLIED:    19:30   SITE UPON ARRIVAL:    Level 0  SITE AFTER BAND REMOVAL:    Level 0  REVERSE ALLEN'S TEST:     positive  CIRCULATION SENSATION AND MOVEMENT:    Within Normal Limits   Yes.    COMMENTS:   Patient tolerated removal of TR band without complications, will continue to monitor patient.

## 2014-11-14 NOTE — Progress Notes (Signed)
Patient Name: Benjamin Barr Date of Encounter: 11/14/2014     Principal Problem:   Unstable angina [facility]) Active Problems:   CAD S/P percutaneous coronary angioplasty   Abnormal nuclear stress test   Hypertension   Obesity   DM (diabetes mellitus), type 2, uncontrolled ([name])   Hyperlipidemia   PMR (polymyalgia rheumatica) ([facility])   Hypokalemia   SUBJECTIVE  S/P PCI/DES to the LCX and RCA yesterday.  No chest tightness overnight (presenting symptom) but has had fleeting "twinges" of c/p.  BP moderately elevated.  C/O a several wk h/o left flank discomfort and deep tenderness.  CURRENT MEDS . aspirin EC  81 mg Oral Daily  . clopidogrel  75 mg Oral Daily  . ferrous fumarate  1 tablet Oral Daily  . hydrochlorothiazide  25 mg Oral Daily  . Influenza vac split quadrivalent PF  0.5 mL Intramuscular Tomorrow-1000  . insulin aspart  0-15 Units Subcutaneous TID WC  . insulin glargine  50 Units Subcutaneous Daily  . insulin glargine  80 Units Subcutaneous QHS  . potassium chloride SA  20 mEq Oral Daily  . simvastatin  40 mg Oral QHS  . sodium chloride  3 mL Intravenous Q12H  . vitamin C  500 mg Oral Daily    OBJECTIVE  Filed Vitals:   11/13/14 1700 11/13/14 1800 11/13/14 2005 11/14/14 0252  BP: 146/70 161/74 131/60 172/70  Pulse:   76 64  Temp:   98.2 F (36.8 C) 98.3 F (36.8 C)  TempSrc:   Oral Oral  Resp: 15 16 16 17   Height:      Weight:    205 lb 0.4 oz (93 kg)  SpO2: 97% 96% 95% 97%    Intake/Output Summary (Last 24 hours) at 11/14/14 0644 Last data filed at 11/14/14 0252  Gross per 24 hour  Intake   1140 ml  Output   1150 ml  Net    -10 ml   Filed Weights   11/13/14 1033 11/14/14 0252  Weight: 204 lb (92.534 kg) 205 lb 0.4 oz (93 kg)    PHYSICAL EXAM  General: Pleasant, NAD. Neuro: Alert and oriented X 3. Moves all extremities spontaneously. Psych: Normal affect. HEENT:  Normal  Neck: Supple without bruits or JVD. Lungs:  Resp regular and unlabored,  diminished on right, otw cta. Heart: RRR no s3, s4, or murmurs. Abdomen: Soft, non-tender, non-distended, BS + x 4. No flank pain or CVA tenderness. Extremities: No clubbing, cyanosis or edema. DP/PT/Radials 2+ and equal bilaterally. R radial cath site w/o bleeding/bruit/hematoma.  Accessory Clinical Findings  CBC  Recent Labs  11/14/14 0352  WBC 8.9  HGB 14.6  HCT 44.1  MCV 84.5  PLT 277   Basic Metabolic Panel  Recent Labs  11/14/14 0352  NA 138  K 3.3*  CL 100*  CO2 30  GLUCOSE 161*  BUN 13  CREATININE 1.07  CALCIUM 9.1   TELE  Rsr, 4 beats nsvt  ECG  Rsr, 65, RBBB, LAE - no acute st/t changes.  Radiology/Studies  Dg Chest 2 View  11/07/2014   CLINICAL DATA:  Preop cardiac catheterization  EXAM: CHEST  2 VIEW  COMPARISON:  07/08/2010  FINDINGS: Lungs are clear.  No pleural effusion or pneumothorax.  Mild cardiomegaly.  Degenerative changes of the visualized thoracolumbar spine.  IMPRESSION: Normal chest radiographs.   Electronically Signed   By: Benjamin Barr M.D.   On: 11/07/2014 12:53    ASSESSMENT AND PLAN  1.  [place]/CAD:  S/p PCI/DES to the LCX and RCA and PTCA of the RPDA on 10/5.  No chest tightness but has had fleeting "twinges" of c/p - doubt cardiac.  ECG stable this AM.  Ambulate this AM with rehab. Cont ASA, plavix, statin.  Plan d/c later this am.  F/U in 1-2 wks.  2.  Essential HTN:  BP trending up overnight.  On HCTZ only @ home.  In setting of DM hx, would benefit from initiation of ACEI (K low, renal fxn nl).  Was previously on lisinopril 20 - looks like this was d/c'd in February following prostate surgery, which was complicated by UTI.  I will resume lisinopril 5.  F/U BMET in 1 wk and titrate as necessary.  3.  HL:  On simva 40.  ? Lipid status.  4.  Type II DM: On lantus.  5.  Morbid Obesity:  Will benefit from cardiac rehab.  6.  Left Flank Tenderness:  He reports a several wk h/o L Flank tenderness with occasional dysuria.  He has a  h/o prostate CA with surgery in Feb.  Post-op course complicated by UTI. Check UA here.  7.  Hypokalemia:  Supp.  Signed, Benjamin Hodgkins NP  Patient seen, examined. Available data reviewed. Agree with findings, assessment, and plan as outlined by Benjamin Bayley, NP. Exam reveals alert, oriented male in NAD, right radial site clear. Heart RRR without murmur, no edema. Agree with addition of lisinopril and FU BMET in one week. FU Dr Benjamin Barr as outpatient. Pt has been on plavix x many years and has tolerated well.  Benjamin Barr, M.D. 11/14/2014 11:05 AM  '

## 2014-11-14 NOTE — Progress Notes (Signed)
CARDIAC REHAB PHASE I   PRE:  Rate/Rhythm: 80 SR  BP:  Supine:   Sitting: 140/63  Standing:    SaO2:   MODE:  Ambulation: 600 ft   POST:  Rate/Rhythm: 93 SR  BP:  Supine:   Sitting: 164/64  Standing:    SaO2:  0800-0908 Pt walked 600 ft with steady gait. Tolerated well. No CP. Education completed. Discussed CRP 2 and will refer to [place]. Pt attended in 2008 and will consider doing again. Discussed carb counting and heart healthy food choices. Pt has had difficulty with sugars due to steroid use for RA and is going to follow up with his endocrinologist.   Graylon Good, RN BSN  11/14/2014 9:04 AM

## 2014-11-21 ENCOUNTER — Encounter: Payer: Self-pay | Admitting: Cardiology

## 2014-11-21 ENCOUNTER — Ambulatory Visit (INDEPENDENT_AMBULATORY_CARE_PROVIDER_SITE_OTHER): Payer: Medicare Other | Admitting: Cardiology

## 2014-11-21 VITALS — BP 110/58 | HR 93 | Ht 68.0 in | Wt 205.0 lb

## 2014-11-21 DIAGNOSIS — Z9861 Coronary angioplasty status: Secondary | ICD-10-CM

## 2014-11-21 DIAGNOSIS — I10 Essential (primary) hypertension: Secondary | ICD-10-CM | POA: Diagnosis not present

## 2014-11-21 DIAGNOSIS — R079 Chest pain, unspecified: Secondary | ICD-10-CM

## 2014-11-21 DIAGNOSIS — I2510 Atherosclerotic heart disease of native coronary artery without angina pectoris: Secondary | ICD-10-CM

## 2014-11-21 DIAGNOSIS — E876 Hypokalemia: Secondary | ICD-10-CM

## 2014-11-21 DIAGNOSIS — E785 Hyperlipidemia, unspecified: Secondary | ICD-10-CM

## 2014-11-21 LAB — BASIC METABOLIC PANEL
BUN: 26 mg/dL — ABNORMAL HIGH (ref 7–25)
CALCIUM: 9.6 mg/dL (ref 8.6–10.3)
CO2: 27 mmol/L (ref 20–31)
Chloride: 100 mmol/L (ref 98–110)
Creat: 1.41 mg/dL — ABNORMAL HIGH (ref 0.70–1.18)
Glucose, Bld: 313 mg/dL — ABNORMAL HIGH (ref 65–99)
POTASSIUM: 3.8 mmol/L (ref 3.5–5.3)
SODIUM: 135 mmol/L (ref 135–146)

## 2014-11-21 NOTE — Patient Instructions (Addendum)
Medication Instructions:  Your physician recommends that you continue on your current medications as directed. Please refer to the Current Medication list given to you today.   Labwork: TODAY:  BMET  Testing/Procedures: None ordered  Follow-Up: Your physician recommends that you keep your scheduled follow-up appointment with Dr. [name] as stated above.    Any Other Special Instructions Will Be Listed Below (If Applicable).  We have sent in another referral for Cardiac Rehab.  You should hear something from their office to schedule an appointment.

## 2014-11-21 NOTE — Progress Notes (Signed)
Cardiology Office Note   Date:  11/21/2014   ID:  Linc, Renne 06/05/1945, MRN [id number]  PCP:  Delrae Rend, MD  Cardiologist:  Dr. [name]     Chief Complaint  Patient presents with  . Hospitalization Follow-up    no chest pain, no SOB      History of Present Illness: Benjamin Barr is a 69 y.o. male who presents for post hospital for unstable angina 10/5-10/07/2014.  He has w/ PMH of CAD w/ Taxus DES to LCx & RCA in 2008, rheumatoid arthritis (& new Dx of PMR), type II DM, prostate CA. he was seen emergently in office for angina and underwent a stress test that was positive.  He then had cardiac cath. He had stenting of the ostial LCx, with DES, stenting of mRCA with DES and POBA of the PDA.   Today no chest pain, no SOB.  He has some urinary burning on occ.  No other problems.  He would like to go to cardiac rehab.  He is taking his asa and plavix.  Has not started exercising yet.  Encouraged walking.    Studies Reviewed:   Echo April 2016: Mild LVH. EF 60-65%, no RWMA, Gr 1 DD, Mod Ao Sclerosis - no stenosis. Mild AI.   Past Medical History  Diagnosis Date  . Hypertension   . Hyperlipidemia   . Obesity   . Prostate cancer ([place])   . CAD (coronary artery disease)     a. 2008 PCI: Taxus DES  to mRCA, prox & mid LCFX;  b. 10/2014 MV: extensive ischemia in the RCA territory and small area of anterior ischemia;  c. 11/2014 PCI: LM nl, LAD 30ost/prox, 80d, LCX 90ost (3.0x12 Promus Prem DES), OM2 30, RCA 74m (3.0x12 Promus Prem DES), 35m, RPDA 90 (PTCA), EF 55-65%.  . Peripheral neuropathy ([place])   . Type 2 diabetes mellitus ([place])   . History of colon polyps     2014--  hyperplastic, adenoma, and benign polyps  . Diverticulosis of colon   . Nonproliferative diabetic retinopathy ([place])   . RBBB (right bundle branch block)     Past Surgical History  Procedure Laterality Date  . Appendectomy    . Partial colectomy    . Cardiovascular stress test  01-17-2012  dr  [name]    Normal perfusion study/ no ischemia or infarction/  normal LVF and wall motion, ef 54%  . Coronary angioplasty with stent placement  05/04/2006   dr [name]    Severe 2-vessel obstructive CAD/  normal LVF/  PCI with DES to proxima and mid CFX and mRCA (total 3 DES)  . Cryoablation N/A 04/01/2014    Procedure: CRYO ABLATION PROSTATE;  Surgeon: [name], MD;  Location: [facility];  Service: Urology;  Laterality: N/A;  . Insertion of suprapubic catheter N/A 04/01/2014    Procedure: INSERTION OF SUPRAPUBIC CATHETER;  Surgeon: [name], MD;  Location: [facility];  Service: Urology;  Laterality: N/A;  suprapubic   . Cardiac catheterization N/A 11/13/2014    Procedure: Left Heart Cath and Coronary Angiography;  Surgeon: [name] [name], MD;  Location: [facility];  Service: Cardiovascular;  Laterality: N/A;  . Cardiac catheterization  11/13/2014    Procedure: Coronary Stent Intervention;  Surgeon: [name] [name], MD;  Location: [facility];  Service: Cardiovascular;;     Current Outpatient Prescriptions  Medication Sig Dispense Refill  . ascorbic Acid (VITAMIN C)  500 MG CPCR Take 500 mg by mouth daily.    [name] aspirin 81 MG tablet Take 81 mg by mouth daily.     . clopidogrel (PLAVIX) 75 MG tablet Take 75 mg by mouth daily.    . ferrous fumarate (HEMOCYTE - 106 MG FE) 325 (106 FE) MG TABS tablet Take 1 tablet by mouth daily.    . hydrochlorothiazide (HYDRODIURIL) 25 MG tablet Take 25 mg by mouth daily.      . insulin glargine (LANTUS) 100 UNIT/ML injection Inject 30-80 Units into the skin 2 (two) times daily. Inject 30 units subque in the morning and 80 units subque at bedtime    . KLOR-CON M20 20 MEQ tablet Take 20 mEq by mouth daily.  5  . lisinopril (PRINIVIL,ZESTRIL) 5 MG tablet Take 1 tablet (5 mg total) by mouth daily. 30 tablet 6  . nitroGLYCERIN (NITROSTAT) 0.4 MG SL tablet Place 1 tablet (0.4 mg total) under the tongue  every 5 (five) minutes as needed for chest pain. 25 tablet 3  . oxymetazoline (AFRIN) 0.05 % nasal spray Place 2 sprays into both nostrils 2 (two) times daily as needed for congestion.    . simvastatin (ZOCOR) 40 MG tablet Take 40 mg by mouth at bedtime.  3   No current facility-administered medications for this visit.    Allergies:   Review of patient's allergies indicates no known allergies.    Social History:  The patient  reports that he has never smoked. He does not have any smokeless tobacco history on file. He reports that he does not drink alcohol or use illicit drugs.   Family History:  The patient's family history includes Diverticulitis in his mother; Heart failure in his father.    ROS:  General:no colds or fevers, no weight changes Skin:no rashes or ulcers HEENT:no blurred vision, no congestion CV:see HPI PUL:see HPI GI:no diarrhea constipation or melena, no indigestion GU:no hematuria, some dysuria MS:no joint pain, no claudication Neuro:no syncope, no lightheadedness Endo:+ diabetes stable.  no thyroid disease  Wt Readings from Last 3 Encounters:  11/21/14 205 lb (92.987 kg)  11/14/14 205 lb 0.4 oz (93 kg)  11/07/14 208 lb (94.348 kg)     PHYSICAL EXAM: VS:  BP 110/58 mmHg  Pulse 93  Ht 5\' 8"  (1.727 m)  Wt 205 lb (92.987 kg)  BMI 31.18 kg/m2 , BMI Body mass index is 31.18 kg/(m^2). General:Pleasant affect, NAD Skin:Warm and dry, brisk capillary refill HEENT:normocephalic, sclera clear, mucus membranes moist Neck:supple, no JVD, no bruits  Heart:S1S2 RRR without murmur, gallup, rub or click Lungs:clear without rales, rhonchi, or wheezes HKV:QQVZ, non tender, + BS, do not palpate liver spleen or masses Ext:no lower ext edema, 2+ pedal pulses, 2+ radial pulses Neuro:alert and oriented, MAE, follows commands, + facial symmetry    EKG:  EKG is ordered today. The ekg ordered today demonstrates SR with RBBB Q wave in III, without change 11/13/14.    Recent  Labs: 11/14/2014: BUN 13; Creatinine, Ser 1.07; Hemoglobin 14.6; Platelets 176; Potassium 3.3*; Sodium 138    Lipid Panel No results found for: CHOL, TRIG, HDL, CHOLHDL, VLDL, LDLCALC, LDLDIRECT     Other studies Reviewed: Additional studies/ records that were reviewed today include: hospital notes, office notes.. Procedures    Coronary Stent Intervention   Left Heart Cath and Coronary Angiography    Conclusion     Ost LAD to Prox LAD lesion, 30% stenosed.  Dist LAD lesion, 80% stenosed.  2nd Mrg lesion,  30% stenosed.  Ost Cx lesion, 90% stenosed.  The left ventricular systolic function is normal.  Ost Cx to Prox Cx lesion, 90% stenosed. This is proximal the the prior stent. There is a 0% residual stenosis post intervention.  RPDA lesion, 90% stenosed. There is a 10% residual stenosis post intervention.  Mid RCA-1 lesion, 90% stenosed. There is a 0% residual stenosis post intervention. A drug-eluting stent was placed. This is proximal to the prior stent.  Mid RCA-1 lesion, 90% stenosed.  A drug-eluting stent was placed.  A drug-eluting stent was placed.  1. Severe 2 vessel obstructive CAD  90% ostial LCx. Prior stent in the proximal to mid LCx is patent.  90% mid RCA proximal to prior stent. The old stent is patent  90% PDA 2. Normal LV function 3. Successful stenting of the ostial LCx with a DES 4. Successful stenting of the mid RCA with a DES  5. Successful POBA of the PDA.  Plan: continue DAPT indefinitely. Anticipate DC in am.       ASSESSMENT AND PLAN:  1.  CAD with new stents to ostial LCX, and mRCA and angioplasty to PDA.  Continue DAPT for 1 year at least.  He continues on ASA and Plavix.  No further angina, keep appt with Dr. [name] in Jan 2017.  He had normal LV functon.  Refer to cardiac rehab.   2.   Hyperlipidemia on statin followed by PCP.  Will send today's note.    3.  Essential HTN  Controlled- ACE added will recheck BMP  today, pt had renal issues post prostate procedure.    4. Hypokalemia, will recheck today.  5. DM followed by Dr. Buddy Duty  6. PMR followed by PCP  7.  Some burning with urination at times, u/a in hospital with few RBCs will send to Dr. Hartley Barefoot and PCP      Current medicines are reviewed with the patient today.  The patient Has no concerns regarding medicines.  The following changes have been made:  See above Labs/ tests ordered today include:see above  Disposition:   FU:  see above  Signed, Benjamin Serge, NP  11/21/2014 11:38 AM    [facility] [address], [place], [place], [place] Phone: [phone]; Fax: [phone]

## 2014-11-22 ENCOUNTER — Telehealth: Payer: Self-pay

## 2014-11-22 DIAGNOSIS — R7989 Other specified abnormal findings of blood chemistry: Secondary | ICD-10-CM

## 2014-11-22 NOTE — Telephone Encounter (Signed)
Called patient about lab results. Per Cecilie Kicks NP, hold lisinopril, Creatinine was elevated, and recheck BMP in 2 weeks. This is prob. Why the lisinopril was stopped before. Patient verbalized understanding. Patient coming in on 12/06/14 for BMP.

## 2014-12-03 ENCOUNTER — Ambulatory Visit: Payer: Medicare Other | Admitting: Nurse Practitioner

## 2014-12-06 ENCOUNTER — Other Ambulatory Visit (INDEPENDENT_AMBULATORY_CARE_PROVIDER_SITE_OTHER): Payer: Medicare Other

## 2014-12-06 DIAGNOSIS — R748 Abnormal levels of other serum enzymes: Secondary | ICD-10-CM | POA: Diagnosis not present

## 2014-12-06 DIAGNOSIS — R7989 Other specified abnormal findings of blood chemistry: Secondary | ICD-10-CM

## 2014-12-06 LAB — BASIC METABOLIC PANEL
BUN: 19 mg/dL (ref 7–25)
CO2: 27 mmol/L (ref 20–31)
Calcium: 9.2 mg/dL (ref 8.6–10.3)
Chloride: 99 mmol/L (ref 98–110)
Creat: 1.28 mg/dL — ABNORMAL HIGH (ref 0.70–1.18)
GLUCOSE: 308 mg/dL — AB (ref 65–99)
POTASSIUM: 3.6 mmol/L (ref 3.5–5.3)
Sodium: 138 mmol/L (ref 135–146)

## 2014-12-12 ENCOUNTER — Encounter (HOSPITAL_COMMUNITY)
Admission: RE | Admit: 2014-12-12 | Discharge: 2014-12-12 | Disposition: A | Discharge status: Home / Self Care | Payer: Medicare Other | Source: Ambulatory Visit | Attending: Cardiology | Admitting: Cardiology

## 2014-12-12 DIAGNOSIS — Z79899 Other long term (current) drug therapy: Secondary | ICD-10-CM | POA: Insufficient documentation

## 2014-12-12 DIAGNOSIS — I454 Nonspecific intraventricular block: Secondary | ICD-10-CM | POA: Insufficient documentation

## 2014-12-12 DIAGNOSIS — Z955 Presence of coronary angioplasty implant and graft: Secondary | ICD-10-CM | POA: Insufficient documentation

## 2014-12-12 NOTE — Progress Notes (Signed)
Cardiac Rehab Medication Review by a Pharmacist  Does the patient  feel that his/her medications are working for him/her?  yes  Has the patient been experiencing any side effects to the medications prescribed?  Yes; patient was recently on prednisone which made his blood sugar very high. His lisinopril was also recently stopped due to swelling in his legs and feet. He also experiences pain with his Lantus injections due to the large volume.  Does the patient measure his/her own blood pressure or blood glucose at home?  yes   Does the patient have any problems obtaining medications due to transportation or finances?   no  Understanding of regimen: excellent Understanding of indications: good Potential of compliance: excellent    Pharmacist comments: Mr. Vidrio and I discussed his lisinopril and how it was recently discontinued due to swelling in his legs and feet. We also discussed his recently completed course of prednisone and how his blood sugars were greatly increased during that time. He endorses pain with his Lantus injections, which is likely due to the large volume. I discussed the concentrated form of insulin glargine (Toujeo) with him and recommended he speak with his endocrinologist at his upcoming appointment.   Governor Specking, PharmD Clinical Pharmacy Resident Pager: [phone] 12/12/2014 9:01 AM

## 2014-12-16 ENCOUNTER — Encounter (HOSPITAL_COMMUNITY)
Admission: RE | Admit: 2014-12-16 | Discharge: 2014-12-16 | Disposition: A | Discharge status: Home / Self Care | Payer: Medicare Other | Source: Ambulatory Visit | Attending: Cardiology | Admitting: Cardiology

## 2014-12-16 DIAGNOSIS — Z955 Presence of coronary angioplasty implant and graft: Secondary | ICD-10-CM | POA: Diagnosis not present

## 2014-12-16 DIAGNOSIS — I454 Nonspecific intraventricular block: Secondary | ICD-10-CM | POA: Diagnosis not present

## 2014-12-16 DIAGNOSIS — Z79899 Other long term (current) drug therapy: Secondary | ICD-10-CM | POA: Diagnosis not present

## 2014-12-16 LAB — GLUCOSE, CAPILLARY
GLUCOSE-CAPILLARY: 275 mg/dL — AB (ref 65–99)
Glucose-Capillary: 233 mg/dL — ABNORMAL HIGH (ref 65–99)

## 2014-12-16 NOTE — Progress Notes (Signed)
Pt started cardiac rehab today.  Pt tolerated light exercise without difficulty. VSS, telemetry-Sinus rhythm with a bundle branch block this has been previously documented, asymptomatic.  Medication list reconciled.  Pt verbalized compliance with medications and denies barriers to compliance. PSYCHOSOCIAL ASSESSMENT:  PHQ-0. Pt exhibits positive coping skills, hopeful outlook with supportive family. No psychosocial needs identified at this time, no psychosocial interventions necessary.    Pt enjoys playing golf.   Pt cardiac rehab  goal is  to loose 10 lbs.  Pt encouraged to participate in exercising on your own  to increase ability to achieve these goals.   Pt long term cardiac rehab goal is increase his strength and to loose 15 lbs..  Pt oriented to exercise equipment and routine.  Understanding verbalized.

## 2014-12-18 ENCOUNTER — Encounter (HOSPITAL_COMMUNITY)
Admission: RE | Admit: 2014-12-18 | Discharge: 2014-12-18 | Disposition: A | Discharge status: Home / Self Care | Payer: Medicare Other | Source: Ambulatory Visit | Attending: Cardiology | Admitting: Cardiology

## 2014-12-18 DIAGNOSIS — Z955 Presence of coronary angioplasty implant and graft: Secondary | ICD-10-CM | POA: Diagnosis not present

## 2014-12-18 LAB — GLUCOSE, CAPILLARY
Glucose-Capillary: 236 mg/dL — ABNORMAL HIGH (ref 65–99)
Glucose-Capillary: 197 mg/dL — ABNORMAL HIGH (ref 65–99)

## 2014-12-20 ENCOUNTER — Encounter (HOSPITAL_COMMUNITY)
Admission: RE | Admit: 2014-12-20 | Discharge: 2014-12-20 | Disposition: A | Discharge status: Home / Self Care | Payer: Medicare Other | Source: Ambulatory Visit | Attending: Cardiology | Admitting: Cardiology

## 2014-12-20 DIAGNOSIS — Z955 Presence of coronary angioplasty implant and graft: Secondary | ICD-10-CM | POA: Diagnosis not present

## 2014-12-20 LAB — GLUCOSE, CAPILLARY
GLUCOSE-CAPILLARY: 228 mg/dL — AB (ref 65–99)
GLUCOSE-CAPILLARY: 234 mg/dL — AB (ref 65–99)

## 2014-12-20 NOTE — Progress Notes (Signed)
Nutrition Note Spoke with pt per Barnet Pall, RN, request. Pt pre-exercise CBG 228 mg/dL after only eating an egg for breakfast. Pt started on Bydureon yesterday. Per discussion, pt CBG's were previously well-controlled until he went on prednisone for PMR. Pt reports CBG's are much better when he went off of prednisone "but then they went up because my weight went up." Pt was ordered pre-meal insulin "but I stopped taking it (prandial insulin) because I didn't see much of a difference in my blood sugars." Pt did increase his Lantus. The difference between insulins discussed. Pt reports "Dr. Buddy Duty told me that." Will monitor pt tolerance of Bydureon with team. Pt educated re: recommended pre-exercise breakfast choices for diabetics. Handouts given for 1800 kcal, 5 day menu ideas and Pre-exercise Blood Sugar Guidelines. Pt expressed understanding of the information reviewed. Continue client-centered nutrition education by RD as part of interdisciplinary care.  Monitor and evaluate progress toward nutrition goal with team.  Derek Mound, M.Ed, RD, LDN, CDE 12/20/2014 11:53 AM

## 2014-12-20 NOTE — Progress Notes (Signed)
Arbin is changing to 8:15 exercise class starting on Monday. Medication changes noted per Dr Buddy Duty. Dietitian asked to speak with Mr Nyborg.

## 2014-12-23 ENCOUNTER — Encounter (HOSPITAL_COMMUNITY)
Admission: RE | Admit: 2014-12-23 | Discharge: 2014-12-23 | Disposition: A | Discharge status: Home / Self Care | Payer: Medicare Other | Source: Ambulatory Visit | Attending: Cardiology | Admitting: Cardiology

## 2014-12-23 ENCOUNTER — Encounter (HOSPITAL_COMMUNITY): Payer: Medicare Other

## 2014-12-23 DIAGNOSIS — Z955 Presence of coronary angioplasty implant and graft: Secondary | ICD-10-CM | POA: Diagnosis not present

## 2014-12-23 NOTE — Progress Notes (Signed)
Pt arrived at cardiac rehab with CBG-310. Pt reported home CBG 200 upon awakening, he ate peanut butter and jelly toast which brought CBG up to 400 on his home meter.  Pt reports he has taken his insulin as prescribed.  Pt asymptomatic.  Pt did not exercise. Pt advised to drink increased water today and watch his CBG closely reporting continued elevated readings to Dr. Buddy Duty.  Understanding verbalized.

## 2014-12-25 ENCOUNTER — Encounter (HOSPITAL_COMMUNITY)
Admission: RE | Admit: 2014-12-25 | Discharge: 2014-12-25 | Disposition: A | Discharge status: Home / Self Care | Payer: Medicare Other | Source: Ambulatory Visit | Attending: Cardiology | Admitting: Cardiology

## 2014-12-25 ENCOUNTER — Encounter (HOSPITAL_COMMUNITY): Payer: Medicare Other

## 2014-12-25 DIAGNOSIS — Z955 Presence of coronary angioplasty implant and graft: Secondary | ICD-10-CM | POA: Diagnosis not present

## 2014-12-25 LAB — GLUCOSE, CAPILLARY
GLUCOSE-CAPILLARY: 254 mg/dL — AB (ref 65–99)
Glucose-Capillary: 310 mg/dL — ABNORMAL HIGH (ref 65–99)

## 2014-12-25 NOTE — Progress Notes (Signed)
Reviewed home exercise with pt today.  Pt plans to walk for exercise. Reviewed THR, pulse, RPE, sign and symptoms, NTG use, and when to call 911 or MD.  Pt voiced understanding.   Benjamin Godino,MS,ACSM RCEP  

## 2014-12-27 ENCOUNTER — Encounter (HOSPITAL_COMMUNITY)
Admission: RE | Admit: 2014-12-27 | Discharge: 2014-12-27 | Disposition: A | Discharge status: Home / Self Care | Payer: Medicare Other | Source: Ambulatory Visit | Attending: Cardiology | Admitting: Cardiology

## 2014-12-27 ENCOUNTER — Encounter (HOSPITAL_COMMUNITY): Payer: Medicare Other

## 2014-12-27 DIAGNOSIS — Z955 Presence of coronary angioplasty implant and graft: Secondary | ICD-10-CM | POA: Diagnosis not present

## 2014-12-27 LAB — GLUCOSE, CAPILLARY: Glucose-Capillary: 199 mg/dL — ABNORMAL HIGH (ref 65–99)

## 2014-12-30 ENCOUNTER — Encounter (HOSPITAL_COMMUNITY): Payer: Medicare Other

## 2014-12-30 ENCOUNTER — Encounter (HOSPITAL_COMMUNITY)
Admission: RE | Admit: 2014-12-30 | Discharge: 2014-12-30 | Disposition: A | Discharge status: Home / Self Care | Payer: Medicare Other | Source: Ambulatory Visit | Attending: Cardiology | Admitting: Cardiology

## 2014-12-30 DIAGNOSIS — Z955 Presence of coronary angioplasty implant and graft: Secondary | ICD-10-CM | POA: Diagnosis not present

## 2014-12-30 LAB — GLUCOSE, CAPILLARY
GLUCOSE-CAPILLARY: 180 mg/dL — AB (ref 65–99)
Glucose-Capillary: 168 mg/dL — ABNORMAL HIGH (ref 65–99)

## 2015-01-01 ENCOUNTER — Encounter (HOSPITAL_COMMUNITY)
Admission: RE | Admit: 2015-01-01 | Discharge: 2015-01-01 | Disposition: A | Discharge status: Home / Self Care | Payer: Medicare Other | Source: Ambulatory Visit | Attending: Cardiology | Admitting: Cardiology

## 2015-01-01 ENCOUNTER — Encounter (HOSPITAL_COMMUNITY): Payer: Medicare Other

## 2015-01-01 DIAGNOSIS — Z955 Presence of coronary angioplasty implant and graft: Secondary | ICD-10-CM | POA: Diagnosis not present

## 2015-01-03 ENCOUNTER — Encounter (HOSPITAL_COMMUNITY): Payer: Medicare Other

## 2015-01-06 ENCOUNTER — Encounter (HOSPITAL_COMMUNITY): Payer: Medicare Other

## 2015-01-08 ENCOUNTER — Encounter (HOSPITAL_COMMUNITY): Payer: Medicare Other

## 2015-01-08 ENCOUNTER — Encounter (HOSPITAL_COMMUNITY)
Admission: RE | Admit: 2015-01-08 | Discharge: 2015-01-08 | Disposition: A | Discharge status: Home / Self Care | Payer: Medicare Other | Source: Ambulatory Visit | Attending: Cardiology | Admitting: Cardiology

## 2015-01-08 DIAGNOSIS — Z955 Presence of coronary angioplasty implant and graft: Secondary | ICD-10-CM | POA: Diagnosis not present

## 2015-01-10 ENCOUNTER — Encounter (HOSPITAL_COMMUNITY)
Admission: RE | Admit: 2015-01-10 | Discharge: 2015-01-10 | Disposition: A | Discharge status: Home / Self Care | Payer: Medicare Other | Source: Ambulatory Visit | Attending: Cardiology | Admitting: Cardiology

## 2015-01-10 ENCOUNTER — Encounter (HOSPITAL_COMMUNITY): Payer: Medicare Other

## 2015-01-10 DIAGNOSIS — Z955 Presence of coronary angioplasty implant and graft: Secondary | ICD-10-CM | POA: Insufficient documentation

## 2015-01-10 DIAGNOSIS — Z79899 Other long term (current) drug therapy: Secondary | ICD-10-CM | POA: Insufficient documentation

## 2015-01-10 DIAGNOSIS — I454 Nonspecific intraventricular block: Secondary | ICD-10-CM | POA: Insufficient documentation

## 2015-01-10 LAB — GLUCOSE, CAPILLARY: Glucose-Capillary: 177 mg/dL — ABNORMAL HIGH (ref 65–99)

## 2015-01-10 NOTE — Progress Notes (Signed)
Pt completed Quality of Life survey as a participant in Cardiac Rehab. Scores 21.0 or below are considered low. Pt score very low in several areas Overall 21, health and functioning 21, over areas low normal.  Pt reports he is concerned about his cancer followup appt.   This year has been difficult for him with cancer and cardiac diagnosis. Pt is encouraged with his abilities at cardiac rehab. Pt is also encouraged about his eating habits and has been able to decrease his intake.  Pt is eating less and feels fuller soon.  Pt feels better with his glycemic control.  Pt c/o fatigue from waking frequently at night to urinate.  Wakes around 3am and unable to fall back to sleep.  Pt is married with supportive wife.  Pt enjoys cooking, meal planning and playing golf.  Pt offered emotional support and reassurance.  Will continue to monitor.

## 2015-01-13 ENCOUNTER — Encounter (HOSPITAL_COMMUNITY)
Admission: RE | Admit: 2015-01-13 | Discharge: 2015-01-13 | Disposition: A | Discharge status: Home / Self Care | Payer: Medicare Other | Source: Ambulatory Visit | Attending: Cardiology | Admitting: Cardiology

## 2015-01-13 ENCOUNTER — Encounter (HOSPITAL_COMMUNITY): Payer: Medicare Other

## 2015-01-13 DIAGNOSIS — Z955 Presence of coronary angioplasty implant and graft: Secondary | ICD-10-CM | POA: Diagnosis not present

## 2015-01-15 ENCOUNTER — Encounter (HOSPITAL_COMMUNITY)
Admission: RE | Admit: 2015-01-15 | Discharge: 2015-01-15 | Disposition: A | Discharge status: Home / Self Care | Payer: Medicare Other | Source: Ambulatory Visit | Attending: Cardiology | Admitting: Cardiology

## 2015-01-15 ENCOUNTER — Encounter (HOSPITAL_COMMUNITY): Payer: Medicare Other

## 2015-01-15 DIAGNOSIS — Z955 Presence of coronary angioplasty implant and graft: Secondary | ICD-10-CM | POA: Diagnosis not present

## 2015-01-15 LAB — GLUCOSE, CAPILLARY: Glucose-Capillary: 155 mg/dL — ABNORMAL HIGH (ref 65–99)

## 2015-01-17 ENCOUNTER — Encounter (HOSPITAL_COMMUNITY)
Admission: RE | Admit: 2015-01-17 | Discharge: 2015-01-17 | Disposition: A | Discharge status: Home / Self Care | Payer: Medicare Other | Source: Ambulatory Visit | Attending: Cardiology | Admitting: Cardiology

## 2015-01-17 ENCOUNTER — Encounter (HOSPITAL_COMMUNITY): Payer: Medicare Other

## 2015-01-17 DIAGNOSIS — Z955 Presence of coronary angioplasty implant and graft: Secondary | ICD-10-CM | POA: Diagnosis not present

## 2015-01-20 ENCOUNTER — Encounter (HOSPITAL_COMMUNITY): Payer: Medicare Other

## 2015-01-20 ENCOUNTER — Encounter (HOSPITAL_COMMUNITY)
Admission: RE | Admit: 2015-01-20 | Discharge: 2015-01-20 | Disposition: A | Discharge status: Home / Self Care | Payer: Medicare Other | Source: Ambulatory Visit | Attending: Cardiology | Admitting: Cardiology

## 2015-01-20 DIAGNOSIS — Z955 Presence of coronary angioplasty implant and graft: Secondary | ICD-10-CM | POA: Diagnosis not present

## 2015-01-22 ENCOUNTER — Encounter (HOSPITAL_COMMUNITY): Payer: Medicare Other

## 2015-01-22 ENCOUNTER — Encounter (HOSPITAL_COMMUNITY)
Admission: RE | Admit: 2015-01-22 | Discharge: 2015-01-22 | Disposition: A | Discharge status: Home / Self Care | Payer: Medicare Other | Source: Ambulatory Visit | Attending: Cardiology | Admitting: Cardiology

## 2015-01-22 DIAGNOSIS — Z955 Presence of coronary angioplasty implant and graft: Secondary | ICD-10-CM | POA: Diagnosis not present

## 2015-01-24 ENCOUNTER — Encounter (HOSPITAL_COMMUNITY)
Admission: RE | Admit: 2015-01-24 | Discharge: 2015-01-24 | Disposition: A | Discharge status: Home / Self Care | Payer: Medicare Other | Source: Ambulatory Visit | Attending: Cardiology | Admitting: Cardiology

## 2015-01-24 ENCOUNTER — Encounter (HOSPITAL_COMMUNITY): Payer: Medicare Other

## 2015-01-24 DIAGNOSIS — Z955 Presence of coronary angioplasty implant and graft: Secondary | ICD-10-CM | POA: Diagnosis not present

## 2015-01-27 ENCOUNTER — Encounter (HOSPITAL_COMMUNITY)
Admission: RE | Admit: 2015-01-27 | Discharge: 2015-01-27 | Disposition: A | Discharge status: Home / Self Care | Payer: Medicare Other | Source: Ambulatory Visit | Attending: Cardiology | Admitting: Cardiology

## 2015-01-27 ENCOUNTER — Encounter (HOSPITAL_COMMUNITY): Payer: Medicare Other

## 2015-01-27 DIAGNOSIS — Z955 Presence of coronary angioplasty implant and graft: Secondary | ICD-10-CM | POA: Diagnosis not present

## 2015-01-27 LAB — GLUCOSE, CAPILLARY: Glucose-Capillary: 149 mg/dL — ABNORMAL HIGH (ref 65–99)

## 2015-01-29 ENCOUNTER — Encounter (HOSPITAL_COMMUNITY)
Admission: RE | Admit: 2015-01-29 | Discharge: 2015-01-29 | Disposition: A | Discharge status: Home / Self Care | Payer: Medicare Other | Source: Ambulatory Visit | Attending: Cardiology | Admitting: Cardiology

## 2015-01-29 ENCOUNTER — Encounter (HOSPITAL_COMMUNITY): Payer: Medicare Other

## 2015-01-29 DIAGNOSIS — Z955 Presence of coronary angioplasty implant and graft: Secondary | ICD-10-CM | POA: Diagnosis not present

## 2015-01-31 ENCOUNTER — Encounter (HOSPITAL_COMMUNITY)
Admission: RE | Admit: 2015-01-31 | Discharge: 2015-01-31 | Disposition: A | Discharge status: Home / Self Care | Payer: Medicare Other | Source: Ambulatory Visit | Attending: Cardiology | Admitting: Cardiology

## 2015-01-31 ENCOUNTER — Encounter (HOSPITAL_COMMUNITY): Payer: Medicare Other

## 2015-01-31 DIAGNOSIS — Z955 Presence of coronary angioplasty implant and graft: Secondary | ICD-10-CM | POA: Diagnosis not present

## 2015-02-03 ENCOUNTER — Encounter (HOSPITAL_COMMUNITY): Payer: Medicare Other

## 2015-02-05 ENCOUNTER — Encounter (HOSPITAL_COMMUNITY): Payer: Medicare Other

## 2015-02-05 ENCOUNTER — Encounter (HOSPITAL_COMMUNITY)
Admission: RE | Admit: 2015-02-05 | Discharge: 2015-02-05 | Disposition: A | Discharge status: Home / Self Care | Payer: Medicare Other | Source: Ambulatory Visit | Attending: Cardiology | Admitting: Cardiology

## 2015-02-05 DIAGNOSIS — Z955 Presence of coronary angioplasty implant and graft: Secondary | ICD-10-CM | POA: Diagnosis not present

## 2015-02-05 NOTE — Progress Notes (Signed)
Benjamin Barr 69 y.o. male Nutrition Note Spoke with pt. Nutrition Plan and Nutrition Survey goals reviewed with pt. Pt is working toward following the Therapeutic Lifestyle Changes diet. Pt wants to lose wt. Pt wt is down to 91 kb, which is down 1.9 kg over the past month. Pt has been trying to lose wt. Wt loss tips briefly reviewed. Pt is diabetic. No recent A1c noted. This Probation officer went over Diabetes Education test results. Pt checks CBG's 1-2 times a day. Fasting CBG's reportedly 120-140 mg/dL "but it can be as high as 200 if I eat some sweets before bedtime." Pt expressed understanding of the information reviewed. Pt aware of nutrition education classes offered and is unable to attend nutrition classes . No results found for: HGBA1C  Nutrition Diagnosis ? Food-and nutrition-related knowledge deficit related to lack of exposure to information as related to diagnosis of: ? CVD ? DM ? Obesity related to excessive energy intake as evidenced by a BMI of 31.1  Nutrition RX/ Estimated Daily Nutrition Needs for: wt loss 1500-2000 Kcal, 40-55 gm fat, 9-13 gm sat fat, 1.4-2.0 gm trans-fat, <1500 mg sodium, 175-250 gm CHO   Nutrition Intervention ? Pt's individual nutrition plan reviewed with pt. ? Benefits of adopting Therapeutic Lifestyle Changes discussed when Medficts reviewed. ? Pt to attend the Portion Distortion class ? Pt to attend the Diabetes Q & A class  ? Pt given handouts for: ? Nutrition I class ? Nutrition II class ? Diabetes Blitz Class ? Continue client-centered nutrition education by RD, as part of interdisciplinary care. Goal(s) ? Pt to identify and limit food sources of saturated fat, trans fat, and cholesterol ? Pt to identify food quantities necessary to achieve: ? wt loss to a goal wt of 180-198 lb (82.0-90.2 kg) at graduation from cardiac rehab.  ? Use pre-meal and post-meal CBG's and A1c to determine whether adjustments in food/meal planning will be beneficial or if any meds  need to be combined with nutrition therapy. Monitor and Evaluate progress toward nutrition goal with team. Nutrition Risk: Change to Moderate Derek Mound, M.Ed, RD, LDN, CDE 02/05/2015 11:19 AM

## 2015-02-07 ENCOUNTER — Encounter (HOSPITAL_COMMUNITY): Payer: Medicare Other

## 2015-02-07 ENCOUNTER — Encounter (HOSPITAL_COMMUNITY)
Admission: RE | Admit: 2015-02-07 | Discharge: 2015-02-07 | Disposition: A | Discharge status: Home / Self Care | Payer: Medicare Other | Source: Ambulatory Visit | Attending: Cardiology | Admitting: Cardiology

## 2015-02-07 DIAGNOSIS — Z955 Presence of coronary angioplasty implant and graft: Secondary | ICD-10-CM | POA: Diagnosis not present

## 2015-02-10 ENCOUNTER — Encounter (HOSPITAL_COMMUNITY): Payer: Medicare Other

## 2015-02-11 ENCOUNTER — Other Ambulatory Visit: Payer: Self-pay

## 2015-02-11 MED ORDER — CLOPIDOGREL BISULFATE 75 MG PO TABS
75.0000 mg | ORAL_TABLET | Freq: Every day | ORAL | Status: DC
Start: 2015-02-11 — End: 2015-08-22

## 2015-02-11 NOTE — Telephone Encounter (Signed)
Benjamin Serge, NP at 11/21/2014 10:45 AM  clopidogrel (PLAVIX) 75 MG tabletTake 75 mg by mouth daily 1. CAD with new stents to ostial LCX, and mRCA and angioplasty to PDA. Continue DAPT for 1 year at least. He continues on ASA and Plavix. No further angina, keep appt with Dr. [name] in Jan 2017. He had normal LV functon. Refer to cardiac rehab.

## 2015-02-12 ENCOUNTER — Encounter (HOSPITAL_COMMUNITY): Payer: Medicare Other

## 2015-02-12 ENCOUNTER — Encounter (HOSPITAL_COMMUNITY)
Admission: RE | Admit: 2015-02-12 | Discharge: 2015-02-12 | Disposition: A | Discharge status: Home / Self Care | Payer: Medicare Other | Source: Ambulatory Visit | Attending: Cardiology | Admitting: Cardiology

## 2015-02-12 DIAGNOSIS — I454 Nonspecific intraventricular block: Secondary | ICD-10-CM | POA: Diagnosis not present

## 2015-02-12 DIAGNOSIS — Z79899 Other long term (current) drug therapy: Secondary | ICD-10-CM | POA: Diagnosis not present

## 2015-02-12 DIAGNOSIS — Z955 Presence of coronary angioplasty implant and graft: Secondary | ICD-10-CM | POA: Insufficient documentation

## 2015-02-14 ENCOUNTER — Encounter (HOSPITAL_COMMUNITY): Payer: Medicare Other

## 2015-02-14 ENCOUNTER — Encounter (HOSPITAL_COMMUNITY)
Admission: RE | Admit: 2015-02-14 | Discharge: 2015-02-14 | Disposition: A | Discharge status: Home / Self Care | Payer: Medicare Other | Source: Ambulatory Visit | Attending: Cardiology | Admitting: Cardiology

## 2015-02-14 ENCOUNTER — Ambulatory Visit (INDEPENDENT_AMBULATORY_CARE_PROVIDER_SITE_OTHER): Payer: Medicare Other | Admitting: Cardiology

## 2015-02-14 ENCOUNTER — Encounter: Payer: Self-pay | Admitting: Cardiology

## 2015-02-14 VITALS — BP 150/76 | HR 88 | Ht 68.0 in | Wt 201.5 lb

## 2015-02-14 DIAGNOSIS — I10 Essential (primary) hypertension: Secondary | ICD-10-CM | POA: Diagnosis not present

## 2015-02-14 DIAGNOSIS — Z9861 Coronary angioplasty status: Secondary | ICD-10-CM | POA: Diagnosis not present

## 2015-02-14 DIAGNOSIS — Z955 Presence of coronary angioplasty implant and graft: Secondary | ICD-10-CM | POA: Diagnosis not present

## 2015-02-14 DIAGNOSIS — E785 Hyperlipidemia, unspecified: Secondary | ICD-10-CM

## 2015-02-14 DIAGNOSIS — I2510 Atherosclerotic heart disease of native coronary artery without angina pectoris: Secondary | ICD-10-CM | POA: Diagnosis not present

## 2015-02-14 NOTE — Progress Notes (Signed)
Cardiology Office Note   Date:  02/14/2015   ID:  Benjamin, Barr 10-29-1945, MRN [id number]  PCP:  [name], MD  Cardiologist:  Dr. [name]     Chief Complaint  Patient presents with  . Coronary Artery Disease      History of Present Illness: Benjamin Barr is a 70 y.o. male who presents for follow up CAD.  He has w/ PMH of CAD w/ Taxus DES to LCx & RCA in 2008, rheumatoid arthritis (& new Dx of PMR), type II DM, prostate CA.He presented in October 2016 with progressive angina.  He had stenting of the ostial LCx, with DES, stenting of mRCA with DES and POBA of the PDA. The prior stents were still patent.   Today he is doing well without chest pain or SOB.  He is participating in [facility] and notes improvement in stamina and breathing. Didn't do well with diet over the holidays and gained some weight. Creatinine was elevated to 1.41 after prostate surgery. Lisinopril was held and it came down to 1.28.    Studies Reviewed:   Echo April 2016: Mild LVH. EF 60-65%, no RWMA, Gr 1 DD, Mod Ao Sclerosis - no stenosis. Mild AI.   Past Medical History  Diagnosis Date  . Hypertension   . Hyperlipidemia   . Obesity   . Prostate cancer ([place])   . CAD (coronary artery disease)     a. 2008 PCI: Taxus DES  to mRCA, prox & mid LCFX;  b. 10/2014 MV: extensive ischemia in the RCA territory and small area of anterior ischemia;  c. 11/2014 PCI: LM nl, LAD 30ost/prox, 80d, LCX 90ost (3.0x12 Promus Prem DES), OM2 30, RCA 11m (3.0x12 Promus Prem DES), 20m, RPDA 90 (PTCA), EF 55-65%.  . Peripheral neuropathy ([name])   . Type 2 diabetes mellitus (Benjamin Barr)   . History of colon polyps     2014--  hyperplastic, adenoma, and benign polyps  . Diverticulosis of colon   . Nonproliferative diabetic retinopathy ([name])   . RBBB (right bundle branch block)     Past Surgical History  Procedure Laterality Date  . Appendectomy    . Partial colectomy    . Cardiovascular stress test   01-17-2012  dr [name]    Normal perfusion study/ no ischemia or infarction/  normal LVF and wall motion, ef 54%  . Coronary angioplasty with stent placement  05/04/2006   dr [name]    Severe 2-vessel obstructive CAD/  normal LVF/  PCI with DES to proxima and mid CFX and mRCA (total 3 DES)  . Cryoablation N/A 04/01/2014    Procedure: CRYO ABLATION PROSTATE;  Surgeon: [name], MD;  Location: [facility];  Service: Urology;  Laterality: N/A;  . Insertion of suprapubic catheter N/A 04/01/2014    Procedure: INSERTION OF SUPRAPUBIC CATHETER;  Surgeon: [name], MD;  Location: [facility];  Service: Urology;  Laterality: N/A;  suprapubic   . Cardiac catheterization N/A 11/13/2014    Procedure: Left Heart Cath and Coronary Angiography;  Surgeon: Brock Larmon M [name], MD;  Location: [facility];  Service: Cardiovascular;  Laterality: N/A;  . Cardiac catheterization  11/13/2014    Procedure: Coronary Stent Intervention;  Surgeon: [name] M [name], MD;  Location: [facility];  Service: Cardiovascular;;     Current Outpatient Prescriptions  Medication Sig Dispense Refill  . ascorbic Acid (VITAMIN C) 500 MG CPCR Take 500 mg by mouth  daily.    . aspirin 81 MG tablet Take 81 mg by mouth daily.     . clopidogrel (PLAVIX) 75 MG tablet Take 1 tablet (75 mg total) by mouth daily. 90 tablet 1  . hydrochlorothiazide (HYDRODIURIL) 25 MG tablet Take 25 mg by mouth daily.      . insulin glargine (LANTUS) 100 UNIT/ML injection Inject 100 Units into the skin daily. Patient takes 50 units of Lantus in the morning and 50 units of Lantus in the evening    . Iron-Vitamin C (VITRON-C) 65-125 MG TABS Take by mouth daily.    [name] KLOR-CON M20 20 MEQ tablet Take 20 mEq by mouth daily.  5  . oxymetazoline (AFRIN) 0.05 % nasal spray Place 2 sprays into both nostrils 2 (two) times daily as needed for congestion.    . simvastatin (ZOCOR) 40 MG tablet Take 40 mg by mouth  daily.   3   No current facility-administered medications for this visit.    Allergies:   Review of patient's allergies indicates no known allergies.    Social History:  The patient  reports that he has never smoked. He does not have any smokeless tobacco history on file. He reports that he does not drink alcohol or use illicit drugs.   Family History:  The patient's family history includes Diverticulitis in his mother; Heart failure in his father.    ROS:  General:no colds or fevers, no weight changes Skin:no rashes or ulcers HEENT: notes chronic sinus congestion. Planning to see ENT. CV:see HPI PUL:see HPI GI:no diarrhea constipation or melena, no indigestion GU:no hematuria, some dysuria MS:no joint pain, no claudication Neuro:no syncope, no lightheadedness Endo:+ diabetes stable.  no thyroid disease  Wt Readings from Last 3 Encounters:  02/14/15 91.4 kg (201 lb 8 oz)  12/12/14 92.9 kg (204 lb 12.9 oz)  11/21/14 92.987 kg (205 lb)     PHYSICAL EXAM: VS:  BP 150/76 mmHg  Pulse 88  Ht 5\' 8"  (1.727 m)  Wt 91.4 kg (201 lb 8 oz)  BMI 30.65 kg/m2  SpO2 95% , BMI Body mass index is 30.65 kg/(m^2). General:Pleasant affect, NAD Skin:Warm and dry, brisk capillary refill HEENT:normocephalic, sclera clear, mucus membranes moist Neck:supple, no JVD, no bruits  Heart:S1S2 RRR without murmur, gallup, rub or click Lungs:clear without rales, rhonchi, or wheezes [id number], non tender, + BS, do not palpate liver spleen or masses Ext:no lower ext edema, 2+ pedal pulses, 2+ radial pulses Neuro:alert and oriented, MAE, follows commands, + facial symmetry    EKG:  EKG is not ordered today.    Recent Labs: 11/14/2014: Hemoglobin 14.6; Platelets 176 12/06/2014: BUN 19; Creat 1.28*; Potassium 3.6; Sodium 138    Lipid Panel No results found for: CHOL, TRIG, HDL, CHOLHDL, VLDL, LDLCALC, LDLDIRECT     Other studies Reviewed: Additional studies/ records that were reviewed today  include: hospital notes, office notes.. Procedures    Coronary Stent Intervention   Left Heart Cath and Coronary Angiography    Conclusion     Ost LAD to Prox LAD lesion, 30% stenosed.  Dist LAD lesion, 80% stenosed.  2nd Mrg lesion, 30% stenosed.  Ost Cx lesion, 90% stenosed.  The left ventricular systolic function is normal.  Ost Cx to Prox Cx lesion, 90% stenosed. This is proximal the the prior stent. There is a 0% residual stenosis post intervention.  RPDA lesion, 90% stenosed. There is a 10% residual stenosis post intervention.  Mid RCA-1 lesion, 90% stenosed. There is a 0%  residual stenosis post intervention. A drug-eluting stent was placed. This is proximal to the prior stent.  Mid RCA-1 lesion, 90% stenosed.  A drug-eluting stent was placed.  A drug-eluting stent was placed.  1. Severe 2 vessel obstructive CAD  90% ostial LCx. Prior stent in the proximal to mid LCx is patent.  90% mid RCA proximal to prior stent. The old stent is patent  90% PDA 2. Normal LV function 3. Successful stenting of the ostial LCx with a DES 4. Successful stenting of the mid RCA with a DES  5. Successful POBA of the PDA.  Plan: continue DAPT indefinitely. Anticipate DC in am.       ASSESSMENT AND PLAN:  1.  CAD  s/p DES stents to ostial LCX, and mRCA and angioplasty to PDA in October 2016.  Continue DAPT for 1 year at least.  He continues on ASA and Plavix.  Continue cardiac Rehab and risk factor modification.  2.   Hyperlipidemia on statin followed by PCP.   3.  Essential HTN  Controlled- ACE held due to elevaed creatinine. BP controlled at cardiac Rehab. Continue to monitor.   4. DM followed by Dr. Buddy Duty  5. PMR followed by PCP- not currently on steroids.     Current medicines are reviewed with the patient today.  The patient Has no concerns regarding medicines.  The following changes have been made:  See above Labs/ tests ordered today include:see  above  Disposition:   FU: 6 months.  Signed, Ama Mcmaster [name], MD  02/14/2015 10:30 AM    [facility] [place], [place], [place] Phone: [phone]; Fax: [phone]

## 2015-02-14 NOTE — Patient Instructions (Signed)
Continue your current therapy and exercise  I will see you in 6 months.

## 2015-02-17 ENCOUNTER — Encounter (HOSPITAL_COMMUNITY): Payer: Medicare Other

## 2015-02-17 ENCOUNTER — Encounter (HOSPITAL_COMMUNITY): Admission: RE | Admit: 2015-02-17 | Payer: Medicare Other | Source: Ambulatory Visit

## 2015-02-19 ENCOUNTER — Encounter (HOSPITAL_COMMUNITY): Payer: Medicare Other

## 2015-02-19 ENCOUNTER — Encounter (HOSPITAL_COMMUNITY)
Admission: RE | Admit: 2015-02-19 | Discharge: 2015-02-19 | Disposition: A | Discharge status: Home / Self Care | Payer: Medicare Other | Source: Ambulatory Visit | Attending: Cardiology | Admitting: Cardiology

## 2015-02-19 DIAGNOSIS — Z955 Presence of coronary angioplasty implant and graft: Secondary | ICD-10-CM | POA: Diagnosis not present

## 2015-02-21 ENCOUNTER — Encounter (HOSPITAL_COMMUNITY): Payer: Medicare Other

## 2015-02-24 ENCOUNTER — Encounter (HOSPITAL_COMMUNITY)
Admission: RE | Admit: 2015-02-24 | Discharge: 2015-02-24 | Disposition: A | Discharge status: Home / Self Care | Payer: Medicare Other | Source: Ambulatory Visit | Attending: Cardiology | Admitting: Cardiology

## 2015-02-24 ENCOUNTER — Encounter (HOSPITAL_COMMUNITY): Payer: Medicare Other

## 2015-02-24 DIAGNOSIS — Z955 Presence of coronary angioplasty implant and graft: Secondary | ICD-10-CM | POA: Diagnosis not present

## 2015-02-26 ENCOUNTER — Encounter (HOSPITAL_COMMUNITY)
Admission: RE | Admit: 2015-02-26 | Discharge: 2015-02-26 | Disposition: A | Discharge status: Home / Self Care | Payer: Medicare Other | Source: Ambulatory Visit | Attending: Cardiology | Admitting: Cardiology

## 2015-02-26 ENCOUNTER — Encounter (HOSPITAL_COMMUNITY): Payer: Medicare Other

## 2015-02-26 DIAGNOSIS — Z955 Presence of coronary angioplasty implant and graft: Secondary | ICD-10-CM | POA: Diagnosis not present

## 2015-02-26 LAB — GLUCOSE, CAPILLARY: Glucose-Capillary: 204 mg/dL — ABNORMAL HIGH (ref 65–99)

## 2015-02-28 ENCOUNTER — Encounter (HOSPITAL_COMMUNITY): Payer: Medicare Other

## 2015-02-28 ENCOUNTER — Encounter (HOSPITAL_COMMUNITY)
Admission: RE | Admit: 2015-02-28 | Discharge: 2015-02-28 | Disposition: A | Discharge status: Home / Self Care | Payer: Medicare Other | Source: Ambulatory Visit | Attending: Cardiology | Admitting: Cardiology

## 2015-02-28 DIAGNOSIS — Z955 Presence of coronary angioplasty implant and graft: Secondary | ICD-10-CM | POA: Diagnosis not present

## 2015-03-03 ENCOUNTER — Encounter (HOSPITAL_COMMUNITY): Payer: Medicare Other

## 2015-03-03 ENCOUNTER — Encounter (HOSPITAL_COMMUNITY)
Admission: RE | Admit: 2015-03-03 | Discharge: 2015-03-03 | Disposition: A | Discharge status: Home / Self Care | Payer: Medicare Other | Source: Ambulatory Visit | Attending: Cardiology | Admitting: Cardiology

## 2015-03-03 DIAGNOSIS — Z955 Presence of coronary angioplasty implant and graft: Secondary | ICD-10-CM | POA: Diagnosis not present

## 2015-03-05 ENCOUNTER — Encounter (HOSPITAL_COMMUNITY): Payer: Medicare Other

## 2015-03-05 ENCOUNTER — Encounter (HOSPITAL_COMMUNITY)
Admission: RE | Admit: 2015-03-05 | Discharge: 2015-03-05 | Disposition: A | Discharge status: Home / Self Care | Payer: Medicare Other | Source: Ambulatory Visit | Attending: Cardiology | Admitting: Cardiology

## 2015-03-05 DIAGNOSIS — Z955 Presence of coronary angioplasty implant and graft: Secondary | ICD-10-CM | POA: Diagnosis not present

## 2015-03-07 ENCOUNTER — Encounter (HOSPITAL_COMMUNITY)
Admission: RE | Admit: 2015-03-07 | Discharge: 2015-03-07 | Disposition: A | Discharge status: Home / Self Care | Payer: Medicare Other | Source: Ambulatory Visit | Attending: Cardiology | Admitting: Cardiology

## 2015-03-07 ENCOUNTER — Encounter (HOSPITAL_COMMUNITY): Payer: Medicare Other

## 2015-03-07 DIAGNOSIS — Z955 Presence of coronary angioplasty implant and graft: Secondary | ICD-10-CM | POA: Diagnosis not present

## 2015-03-10 ENCOUNTER — Encounter (HOSPITAL_COMMUNITY)
Admission: RE | Admit: 2015-03-10 | Discharge: 2015-03-10 | Disposition: A | Discharge status: Home / Self Care | Payer: Medicare Other | Source: Ambulatory Visit | Attending: Cardiology | Admitting: Cardiology

## 2015-03-10 DIAGNOSIS — Z955 Presence of coronary angioplasty implant and graft: Secondary | ICD-10-CM | POA: Diagnosis not present

## 2015-03-12 ENCOUNTER — Encounter (HOSPITAL_COMMUNITY)
Admission: RE | Admit: 2015-03-12 | Discharge: 2015-03-12 | Disposition: A | Discharge status: Home / Self Care | Payer: Medicare Other | Source: Ambulatory Visit | Attending: Cardiology | Admitting: Cardiology

## 2015-03-12 DIAGNOSIS — Z955 Presence of coronary angioplasty implant and graft: Secondary | ICD-10-CM | POA: Diagnosis present

## 2015-03-12 DIAGNOSIS — Z79899 Other long term (current) drug therapy: Secondary | ICD-10-CM | POA: Insufficient documentation

## 2015-03-12 DIAGNOSIS — I454 Nonspecific intraventricular block: Secondary | ICD-10-CM | POA: Diagnosis not present

## 2015-03-14 ENCOUNTER — Encounter (HOSPITAL_COMMUNITY): Payer: Medicare Other

## 2015-03-17 ENCOUNTER — Encounter (HOSPITAL_COMMUNITY)
Admission: RE | Admit: 2015-03-17 | Discharge: 2015-03-17 | Disposition: A | Discharge status: Home / Self Care | Payer: Medicare Other | Source: Ambulatory Visit | Attending: Cardiology | Admitting: Cardiology

## 2015-03-17 DIAGNOSIS — Z955 Presence of coronary angioplasty implant and graft: Secondary | ICD-10-CM | POA: Diagnosis not present

## 2015-03-17 LAB — GLUCOSE, CAPILLARY: Glucose-Capillary: 233 mg/dL — ABNORMAL HIGH (ref 65–99)

## 2015-03-19 ENCOUNTER — Encounter (HOSPITAL_COMMUNITY)
Admission: RE | Admit: 2015-03-19 | Discharge: 2015-03-19 | Disposition: A | Discharge status: Home / Self Care | Payer: Medicare Other | Source: Ambulatory Visit | Attending: Cardiology | Admitting: Cardiology

## 2015-03-19 DIAGNOSIS — Z955 Presence of coronary angioplasty implant and graft: Secondary | ICD-10-CM | POA: Diagnosis not present

## 2015-03-21 ENCOUNTER — Encounter (HOSPITAL_COMMUNITY)
Admission: RE | Admit: 2015-03-21 | Discharge: 2015-03-21 | Disposition: A | Discharge status: Home / Self Care | Payer: Medicare Other | Source: Ambulatory Visit | Attending: Cardiology | Admitting: Cardiology

## 2015-03-21 DIAGNOSIS — Z955 Presence of coronary angioplasty implant and graft: Secondary | ICD-10-CM | POA: Diagnosis not present

## 2015-03-21 LAB — GLUCOSE, CAPILLARY
GLUCOSE-CAPILLARY: 201 mg/dL — AB (ref 65–99)
Glucose-Capillary: 204 mg/dL — ABNORMAL HIGH (ref 65–99)

## 2015-03-24 ENCOUNTER — Encounter (HOSPITAL_COMMUNITY)
Admission: RE | Admit: 2015-03-24 | Discharge: 2015-03-24 | Disposition: A | Discharge status: Home / Self Care | Payer: Medicare Other | Source: Ambulatory Visit | Attending: Cardiology | Admitting: Cardiology

## 2015-03-24 DIAGNOSIS — Z955 Presence of coronary angioplasty implant and graft: Secondary | ICD-10-CM | POA: Diagnosis not present

## 2015-03-26 ENCOUNTER — Encounter (HOSPITAL_COMMUNITY)
Admission: RE | Admit: 2015-03-26 | Discharge: 2015-03-26 | Disposition: A | Discharge status: Home / Self Care | Payer: Medicare Other | Source: Ambulatory Visit | Attending: Cardiology | Admitting: Cardiology

## 2015-03-26 ENCOUNTER — Encounter (HOSPITAL_COMMUNITY): Payer: Self-pay

## 2015-03-26 DIAGNOSIS — Z955 Presence of coronary angioplasty implant and graft: Secondary | ICD-10-CM | POA: Diagnosis not present

## 2015-03-26 NOTE — Progress Notes (Signed)
Pt graduated from cardiac rehab program today with completion of 36 exercise sessions in Phase II. Pt maintained good attendance and progressed nicely during his participation in rehab as evidenced by increased MET level.   Medication list reconciled. Repeat  PHQ score-  0.  Pt has made significant lifestyle changes and should be commended for his success. Pt feels he has achieved his goals during cardiac rehab especially with increased strength and stamina.  Pt QOL scores reflect his improved symptoms and outlook on his life.  Pt is disappointed he has not lost weight. Pt encouraged to continue working towards weight loss efforts, especially combining exercise with portion control and decreased caloric intake.   Pt plans to continue exercising on his own walking at home.

## 2015-03-28 ENCOUNTER — Encounter (HOSPITAL_COMMUNITY): Payer: Medicare Other

## 2015-03-31 ENCOUNTER — Encounter (HOSPITAL_COMMUNITY): Payer: Medicare Other

## 2015-04-02 ENCOUNTER — Encounter (HOSPITAL_COMMUNITY): Payer: Medicare Other

## 2015-04-04 ENCOUNTER — Encounter (HOSPITAL_COMMUNITY): Payer: Medicare Other

## 2015-04-22 ENCOUNTER — Ambulatory Visit
Admission: RE | Admit: 2015-04-22 | Discharge: 2015-04-22 | Disposition: A | Discharge status: Home / Self Care | Payer: Medicare Other | Source: Ambulatory Visit | Attending: Family Medicine | Admitting: Family Medicine

## 2015-04-22 ENCOUNTER — Other Ambulatory Visit: Payer: Self-pay | Admitting: Family Medicine

## 2015-04-22 DIAGNOSIS — R05 Cough: Secondary | ICD-10-CM

## 2015-04-22 DIAGNOSIS — R059 Cough, unspecified: Secondary | ICD-10-CM

## 2015-05-22 ENCOUNTER — Ambulatory Visit
Admission: RE | Admit: 2015-05-22 | Discharge: 2015-05-22 | Disposition: A | Discharge status: Home / Self Care | Payer: Medicare Other | Source: Ambulatory Visit | Attending: Family Medicine | Admitting: Family Medicine

## 2015-05-22 ENCOUNTER — Other Ambulatory Visit: Payer: Self-pay | Admitting: Family Medicine

## 2015-05-22 DIAGNOSIS — J189 Pneumonia, unspecified organism: Secondary | ICD-10-CM

## 2015-08-20 ENCOUNTER — Other Ambulatory Visit: Payer: Self-pay | Admitting: Otolaryngology

## 2015-08-20 DIAGNOSIS — R221 Localized swelling, mass and lump, neck: Secondary | ICD-10-CM

## 2015-08-22 ENCOUNTER — Ambulatory Visit
Admission: RE | Admit: 2015-08-22 | Discharge: 2015-08-22 | Disposition: A | Discharge status: Home / Self Care | Payer: Medicare Other | Source: Ambulatory Visit | Attending: Otolaryngology | Admitting: Otolaryngology

## 2015-08-22 ENCOUNTER — Other Ambulatory Visit: Payer: Self-pay | Admitting: *Deleted

## 2015-08-22 DIAGNOSIS — R221 Localized swelling, mass and lump, neck: Secondary | ICD-10-CM

## 2015-08-22 MED ORDER — CLOPIDOGREL BISULFATE 75 MG PO TABS: 75.0000 mg | ORAL_TABLET | Freq: Every day | ORAL | Status: DC

## 2015-08-22 NOTE — Telephone Encounter (Signed)
Rx has been sent to the pharmacy electronically. ° °

## 2015-08-26 ENCOUNTER — Other Ambulatory Visit: Payer: Self-pay | Admitting: Radiation Oncology

## 2015-08-27 ENCOUNTER — Other Ambulatory Visit: Payer: Medicare Other

## 2015-08-28 ENCOUNTER — Other Ambulatory Visit: Payer: Self-pay | Admitting: Unknown Physician Specialty

## 2015-08-28 DIAGNOSIS — C760 Malignant neoplasm of head, face and neck: Secondary | ICD-10-CM

## 2015-08-29 ENCOUNTER — Ambulatory Visit
Admission: RE | Admit: 2015-08-29 | Discharge: 2015-08-29 | Disposition: A | Discharge status: Home / Self Care | Payer: Medicare Other | Source: Ambulatory Visit | Attending: Unknown Physician Specialty | Admitting: Unknown Physician Specialty

## 2015-08-29 DIAGNOSIS — Z8546 Personal history of malignant neoplasm of prostate: Secondary | ICD-10-CM | POA: Diagnosis not present

## 2015-08-29 DIAGNOSIS — K760 Fatty (change of) liver, not elsewhere classified: Secondary | ICD-10-CM | POA: Insufficient documentation

## 2015-08-29 DIAGNOSIS — K5730 Diverticulosis of large intestine without perforation or abscess without bleeding: Secondary | ICD-10-CM | POA: Insufficient documentation

## 2015-08-29 DIAGNOSIS — R937 Abnormal findings on diagnostic imaging of other parts of musculoskeletal system: Secondary | ICD-10-CM | POA: Insufficient documentation

## 2015-08-29 DIAGNOSIS — K8020 Calculus of gallbladder without cholecystitis without obstruction: Secondary | ICD-10-CM | POA: Insufficient documentation

## 2015-08-29 DIAGNOSIS — I700 Atherosclerosis of aorta: Secondary | ICD-10-CM | POA: Insufficient documentation

## 2015-08-29 DIAGNOSIS — C760 Malignant neoplasm of head, face and neck: Secondary | ICD-10-CM | POA: Diagnosis not present

## 2015-08-29 DIAGNOSIS — R590 Localized enlarged lymph nodes: Secondary | ICD-10-CM | POA: Diagnosis not present

## 2015-08-29 DIAGNOSIS — I2510 Atherosclerotic heart disease of native coronary artery without angina pectoris: Secondary | ICD-10-CM | POA: Insufficient documentation

## 2015-08-29 LAB — GLUCOSE, CAPILLARY: Glucose-Capillary: 147 mg/dL — ABNORMAL HIGH (ref 65–99)

## 2015-08-29 MED ORDER — FLUDEOXYGLUCOSE F - 18 (FDG) INJECTION
12.5400 | Freq: Once | INTRAVENOUS | Status: AC | PRN
  Administered 2015-08-29: 12.54 via INTRAVENOUS

## 2015-09-01 ENCOUNTER — Telehealth: Payer: Self-pay | Admitting: *Deleted

## 2015-09-01 ENCOUNTER — Encounter: Payer: Self-pay | Admitting: *Deleted

## 2015-09-01 NOTE — Telephone Encounter (Signed)
  Oncology Nurse Navigator Documentation  Navigator Location: [facility]Med Onc (09/01/15 1615) Navigator Encounter Type: Telephone;Diagnostic Results (09/01/15 1615)                       Spoke with Clarene Essex EN&T, requested copy of Mr. Muffler' 08/20/15 pathology report, provided RadOnc fax #.  [name], RN, BSN, [place] at [place] [phone]                       Time Spent with Patient: 15 (09/01/15 1615)

## 2015-09-01 NOTE — Telephone Encounter (Signed)
  Oncology Nurse Navigator Documentation  Navigator Location: [facility] (09/01/15 1500) Navigator Encounter Type: Introductory phone call (09/01/15 1500)             Treatment Phase: Pre-Tx/Tx Discussion (09/01/15 1500) Barriers/Navigation Needs: Education (09/01/15 1500) Education: Newly Diagnosed Cancer Education (09/01/15 1500) Interventions: Coordination of Care (09/01/15 1500)       Placed introductory call to new referral patient.  Introduced myself as the H&N oncology nurse navigator that works with Dr. Alvy Bimler to whom he has been referred by Dr. Richardson Landry, ENT [place].  He confirmed his understanding of referral.  I provided his appt times of 1330 and 1530 for this Wednesday to see Drs. Ann Lions, respectively.  I briefly explained my role as his navigator, indicated that I would be joining him during his appts.    I confirmed his understanding of [place] location, explained arrival and MedOnc registration process for appt.  I provided my contact information, encouraged him to call with questions/concerns before Wednesday.    He verbalized understanding of information provided, expressed appreciation for my call.  [name], RN, BSN, [place] at [place] [phone]                      Time Spent with Patient: 30 (09/01/15 1500)

## 2015-09-02 ENCOUNTER — Ambulatory Visit: Payer: Medicare Other | Admitting: Internal Medicine

## 2015-09-02 NOTE — Progress Notes (Signed)
Head and Neck Cancer Location of Tumor / Histology:  Right Neck  Patient presented with symptoms of:  Right neck mass  Fine needle aspiration of Right neck revealed: positive for malignant cells, squamous cell carcinoma present.   Nutrition Status Yes No Comments  Weight changes? []  [x]    Swallowing concerns? []  [x]    PEG? []  [x]     Referrals Yes No Comments  Social Work? []  [x]    Dentistry? [x]  []  Referral placed for dental by Dr. Alvy Bimler  Swallowing therapy? [x]  []  Referral placed by Dr. Alvy Bimler  Nutrition? [x]  []  Referral placed by Dr. Alvy Bimler  Med/Onc? [x]  []  Dr. Alvy Bimler 7/26. ? If Chemotherapy needed   Safety Issues Yes No Comments  Prior radiation? []  [x]    Pacemaker/ICD? []  [x]    Possible current pregnancy? []  [x]    Is the patient on methotrexate? []  [x]     Tobacco/Marijuana/Snuff/ETOH use: He reports quitting smoking in 1965.   Past/Anticipated interventions by otolaryngology, if any:  Dr. Richardson Landry ENT [facility]  Past/Anticipated interventions by medical oncology, if any:  He had an appointment with Dr. Alvy Bimler. They are not sure if chemotherapy will be needed at this time. There is no plan to see Dr. Alvy Bimler again at this time.        Current Complaints / other details:   PET 08/29/15 IMPRESSION: 1. Right-sided cervical (level 5A) lymphadenopathy with malignant range metabolic activity. No discrete primary malignancy otherwise identified. 2. No metastatic disease in the chest, abdomen or pelvis. 3. Subtle asymmetric hypermetabolism in the right peripheral zone of the prostate gland. Given the patient's prior history prostate cancer, correlation with PSA levels and physical examination is recommended. 4. Hepatic steatosis. 5. Aortic atherosclerosis, in addition to left main and 3 vessel coronary artery disease. Assessment for potential risk factor modification, dietary therapy or pharmacologic therapy may be warranted, if clinically indicated. 6.  Cholelithiasis and/or biliary sludge, without evidence of acute cholecystitis at this time. 7. Colonic diverticulosis without evidence of acute diverticulitis at this time. 8. Additional incidental findings, as above.  BP (!) 141/68   Pulse 77   Temp 98.2 F (36.8 C)   Ht 5\' 8"  (1.727 m)   Wt 201 lb 14.4 oz (91.6 kg)   BMI 30.70 kg/m     No chief complaint on file.

## 2015-09-03 ENCOUNTER — Encounter: Payer: Self-pay | Admitting: *Deleted

## 2015-09-03 ENCOUNTER — Encounter: Payer: Self-pay | Admitting: Hematology and Oncology

## 2015-09-03 ENCOUNTER — Ambulatory Visit
Admission: RE | Admit: 2015-09-03 | Discharge: 2015-09-03 | Disposition: A | Discharge status: Home / Self Care | Payer: Medicare Other | Source: Ambulatory Visit | Attending: Radiation Oncology | Admitting: Radiation Oncology

## 2015-09-03 ENCOUNTER — Encounter: Payer: Self-pay | Admitting: Radiation Oncology

## 2015-09-03 ENCOUNTER — Ambulatory Visit (HOSPITAL_BASED_OUTPATIENT_CLINIC_OR_DEPARTMENT_OTHER): Payer: Medicare Other | Admitting: Hematology and Oncology

## 2015-09-03 VITALS — BP 141/68 | HR 77 | Temp 98.2°F | Ht 68.0 in | Wt 201.9 lb

## 2015-09-03 DIAGNOSIS — C4492 Squamous cell carcinoma of skin, unspecified: Secondary | ICD-10-CM | POA: Diagnosis present

## 2015-09-03 DIAGNOSIS — E785 Hyperlipidemia, unspecified: Secondary | ICD-10-CM

## 2015-09-03 DIAGNOSIS — M353 Polymyalgia rheumatica: Secondary | ICD-10-CM | POA: Diagnosis not present

## 2015-09-03 DIAGNOSIS — Z794 Long term (current) use of insulin: Secondary | ICD-10-CM

## 2015-09-03 DIAGNOSIS — E1140 Type 2 diabetes mellitus with diabetic neuropathy, unspecified: Secondary | ICD-10-CM

## 2015-09-03 DIAGNOSIS — C770 Secondary and unspecified malignant neoplasm of lymph nodes of head, face and neck: Secondary | ICD-10-CM | POA: Diagnosis present

## 2015-09-03 DIAGNOSIS — Z9861 Coronary angioplasty status: Secondary | ICD-10-CM

## 2015-09-03 DIAGNOSIS — Z510 Encounter for antineoplastic radiation therapy: Secondary | ICD-10-CM | POA: Insufficient documentation

## 2015-09-03 DIAGNOSIS — R5381 Other malaise: Secondary | ICD-10-CM | POA: Diagnosis not present

## 2015-09-03 DIAGNOSIS — I2510 Atherosclerotic heart disease of native coronary artery without angina pectoris: Secondary | ICD-10-CM

## 2015-09-03 DIAGNOSIS — C029 Malignant neoplasm of tongue, unspecified: Secondary | ICD-10-CM | POA: Insufficient documentation

## 2015-09-03 DIAGNOSIS — C01 Malignant neoplasm of base of tongue: Secondary | ICD-10-CM | POA: Diagnosis not present

## 2015-09-03 DIAGNOSIS — Z8546 Personal history of malignant neoplasm of prostate: Secondary | ICD-10-CM | POA: Diagnosis not present

## 2015-09-03 NOTE — Assessment & Plan Note (Signed)
I suspect a squamous cell carcinoma could possibly originated from his skin. PET/CT scan showed no definitive primary source within the head and neck region. The patient had history of skin cancer removed and I recommend further information from his dermatologist. We will get his case discussed at the ENT tumor board next week. For now, I do not felt that his history necessary to recommend concurrent chemotherapy. He has appointment to meet with radiation oncologist. I will get him set up to follow with dental specialist for oral evaluation, nutritionist and physical therapist

## 2015-09-03 NOTE — Assessment & Plan Note (Signed)
He is asymptomatic and has completed prednisone taper

## 2015-09-03 NOTE — Progress Notes (Signed)
  Oncology Nurse Navigator Documentation  Navigator Location: [facility] (09/03/15 1315) Navigator Encounter Type: Initial MedOnc (09/03/15 1315)             Treatment Phase: Pre-Tx/Tx Discussion (09/03/15 1315) Barriers/Navigation Needs: Education (09/03/15 1315) Education: Newly Diagnosed Cancer Education (09/03/15 1315)       Met with patient during initial consult with Dr. Alvy Bimler.  He was accompanied by his wife. 1. Further introduced myself as his/their Navigator, explained my role as a member of the Care Team. 2. Provided New Patient Information packet:  Contact information for physician, this navigator, other members of the Care Team  Advance Directive information ([facility] blue pamphlet with LCSW insert), copy of [facility] AD booklet.  Fall Prevention Patient Safety Plan  Appointment Guideline  Financial Assistance Information sheet  [place] with highlight of [place]. 3. Showed them the location of Dr. Ritta Slot office for future appts, including arrival procedure for these appts.   4. They verbalized understanding of information provided.  [name], RN, BSN, [place] at [place] [phone]                     Time Spent with Patient: 75 (09/03/15 1315)

## 2015-09-03 NOTE — Progress Notes (Signed)
  Oncology Nurse Navigator Documentation  Navigator Location: [facility] (09/03/15 1530) Navigator Encounter Type: Initial RadOnc (09/03/15 1530)       Met with Mr. Guardia and his wife during initial consult with Dr. Isidore Moos.    We had met earlier this afternoon during consult appointment with Dr. Alvy Bimler. 1. Provided introductory explanation of radiation treatment including SIM planning and purpose of Aquaplast head and shoulder mask, showed them example.   2. They understand I will arrange for referral to [facility] ENT with MD other than with whom they originally met. 3. We discussed his attendance at the August 8th H&N MDC, I provided him [facility] information sheet and an arrival time of 0800.  They verbalized understanding of information provided.    [name], RN, BSN, [place] at [place] [phone]                                      Time Spent with Patient: 90 (09/03/15 1530)

## 2015-09-03 NOTE — Assessment & Plan Note (Signed)
He is asymptomatic with no further signs and symptoms of angina. Continue medical management

## 2015-09-03 NOTE — Assessment & Plan Note (Signed)
As mentioned above, the abnormal lymph node enlargement with squamous cell carcinoma is of unknown primary but could have originated from history of skin cancer He is asymptomatic. We will get his case presented at the next ENT tumor board.

## 2015-09-03 NOTE — Progress Notes (Signed)
Benjamin Barr NOTE  Patient Care Team: Aura Dials, MD as PCP - General (Family Medicine) Beverly Gust, MD as Referring Physician (Otolaryngology) Leota Sauers, RN as Oncology Nurse Navigator Heath Lark, MD as Consulting Physician (Hematology and Oncology) Carolan Clines, MD as Consulting Physician (Urology) Delrae Rend, MD as Consulting Physician (Endocrinology) [name] [name], MD as Consulting Physician (Cardiology) Crista Luria, MD as Consulting Physician (Dermatology)  CHIEF COMPLAINTS/PURPOSE OF CONSULTATION:  Squamous cell carcinoma detected in the lymph node biopsy  HISTORY OF PRESENTING ILLNESS:  Benjamin Barr 70 y.o. 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 Disposition had complex past medical history of poorly controlled diabetes, hypertension, hyperlipidemia, history of myocardial infarction, morbid obesity with peripheral neuropathy.  According to the patient, the first initial presentation was due to palpable lymph node/lump on the right side of his neck. The patient underwent extensive evaluation as summarized below:   Squamous cell skin cancer   08/20/2015 Miscellaneous    He was evaluated by ENT     08/22/2015 Imaging    CT neck showed malignant adenopathy right supraclavicular region compatible with metastatic disease. Biopsy recommended. No definite pharyngeal mass lesion identified by CT.      08/26/2015 Procedure    He has FNA of right neck LN     08/26/2015 Pathology Results    FNA is positive for squamous cell carcinoma     08/29/2015 PET scan    PET scan showed 2.1 x 4 cm, SUV 8.1. No other primary or metastatic cancer. Incidental kidney cyst right upper pole      he denies any hearing deficit, difficulties with chewing food, swallowing difficulties, painful swallowing, changes in the quality of his voice or abnormal weight loss.  The patient describes a procedure  with what sounds like laryngoscopy but I do not have that procedure note available for my review. He had history of skin cancer removed but did not know if he has squamous cell carcinoma or basal cell carcinoma. He was never diagnosed with melanoma. The patient had history of prostate cancer detected due to elevated PSA in February 2016. He underwent cryoablation. Postoperatively, apparently, the patient developed some form of suprapubic infection and urinary retention. Those had resolved. His last follow-up here with urologist show undetectable PSA  The patient had history of nosebleed in the past, managed conservatively. He has history of pneumonia and other infections The patient has poorly controlled diabetes and hyperlipidemia. He had baseline severe peripheral neuropathy with sensation of pins and needle and burning on his feet  MEDICAL HISTORY:  Past Medical History:  Diagnosis Date  . CAD (coronary artery disease)    a. 2008 PCI: Taxus DES  to mRCA, prox & mid LCFX;  b. 10/2014 MV: extensive ischemia in the RCA territory and small area of anterior ischemia;  c. 11/2014 PCI: LM nl, LAD 30ost/prox, 80d, LCX 90ost (3.0x12 Promus Prem DES), OM2 30, RCA 64m (3.0x12 Promus Prem DES), 66m, RPDA 90 (PTCA), EF 55-65%.  . Cancer ([place])    Head & neck ca  . Diverticulosis of colon   . History of colon polyps    2014--  hyperplastic, adenoma, and benign polyps  . Hyperlipidemia   . Hypertension   . Myocardial infarction ([place])   . Nonproliferative diabetic retinopathy ([place])   . Obesity   . Peripheral neuropathy ([place])   . Prostate cancer ([place])   . RBBB (right bundle branch  block)   . Skin cancer   . Type 2 diabetes mellitus ([place])     SURGICAL HISTORY: Past Surgical History:  Procedure Laterality Date  . APPENDECTOMY    . CARDIAC CATHETERIZATION N/A 11/13/2014   Procedure: Left Heart Cath and Coronary Angiography;  Surgeon: [name] [name], MD;  Location: [facility];  Service:  Cardiovascular;  Laterality: N/A;  . CARDIAC CATHETERIZATION  11/13/2014   Procedure: Coronary Stent Intervention;  Surgeon: [name] [name], MD;  Location: [facility];  Service: Cardiovascular;;  . CARDIOVASCULAR STRESS TEST  01-17-2012  dr [name]   Normal perfusion study/ no ischemia or infarction/  normal LVF and wall motion, ef 54%  . CORONARY ANGIOPLASTY WITH STENT PLACEMENT  05/04/2006   dr [name]   Severe 2-vessel obstructive CAD/  normal LVF/  PCI with DES to proxima and mid CFX and mRCA (total 3 DES)  . CRYOABLATION N/A 04/01/2014   Procedure: CRYO ABLATION PROSTATE;  Surgeon: [name], MD;  Location: [facility];  Service: Urology;  Laterality: N/A;  . INSERTION OF SUPRAPUBIC CATHETER N/A 04/01/2014   Procedure: INSERTION OF SUPRAPUBIC CATHETER;  Surgeon: [name], MD;  Location: [facility];  Service: Urology;  Laterality: N/A;  suprapubic   . PARTIAL COLECTOMY      SOCIAL HISTORY: Social History   Social History  . Marital status: Married    Spouse name: Neoma Laming  . Number of children: 2  . Years of education: N/A   Occupational History  . sales     retired   Social History Main Topics  . Smoking status: Former Smoker    Quit date: 02/09/1963  . Smokeless tobacco: Never Used  . Alcohol use No  . Drug use: No  . Sexual activity: Not on file   Other Topics Concern  . Not on file   Social History Narrative  . No narrative on file    FAMILY HISTORY: Family History  Problem Relation Age of Onset  . Diverticulitis Mother   . Heart failure Father   . Cancer Father     prostate ca    ALLERGIES:  has No Known Allergies.  MEDICATIONS:  Current Outpatient Prescriptions  Medication Sig Dispense Refill  . ascorbic Acid (VITAMIN C) 500 MG CPCR Take 500 mg by mouth daily.    [name] aspirin 81 MG tablet Take 81 mg by mouth daily.     . clopidogrel (PLAVIX) 75 MG tablet Take 1 tablet (75 mg total) by mouth daily.  90 tablet 0  . Diphenhyd-Hydrocort-Nystatin (FIRST-DUKES MOUTHWASH) SUSP Use as directed in the mouth or throat as needed.    . fluticasone (FLONASE) 50 MCG/ACT nasal spray Place 2 sprays into both nostrils daily.    . hydrochlorothiazide (HYDRODIURIL) 25 MG tablet Take 25 mg by mouth daily.      . insulin aspart protamine- aspart (NOVOLOG MIX 70/30) (70-30) 100 UNIT/ML injection Inject into the skin daily with supper. 1 unit before large meal    . insulin glargine (LANTUS) 100 UNIT/ML injection Inject 100 Units into the skin daily.     . Iron-Vitamin C (VITRON-C) 65-125 MG TABS Take by mouth daily.    . simvastatin (ZOCOR) 40 MG tablet Take 40 mg by mouth daily.   3  . UNABLE TO FIND Place in ear(s) daily as needed. Med Name: Flyocinolone.   Ear drops as needed for ears itching     No current facility-administered medications for  this visit.     REVIEW OF SYSTEMS:   Constitutional: Denies fevers, chills or abnormal night sweats Eyes: Denies blurriness of vision, double vision or watery eyes Ears, nose, mouth, throat, and face: Denies mucositis or sore throat Respiratory: Denies cough, dyspnea or wheezes Cardiovascular: Denies palpitation, chest discomfort or lower extremity swelling Gastrointestinal:  Denies nausea, heartburn or change in bowel habits Skin: Denies abnormal skin rashes Behavioral/Psych: Mood is stable, no new changes  All other systems were reviewed with the patient and are negative.  PHYSICAL EXAMINATION: ECOG PERFORMANCE STATUS: 1 - Symptomatic but completely ambulatory  Vitals:   09/03/15 1304  BP: (!) 146/66  Pulse: 84  Resp: 18  Temp: 98.3 F (36.8 C)   Filed Weights   09/03/15 1304  Weight: 201 lb 8 oz (91.4 kg)    GENERAL:alert, no distress and comfortable. He is obese  SKIN: skin color, texture, turgor are normal, no rashes or significant lesions EYES: normal, conjunctiva are pink and non-injected, sclera clear OROPHARYNX:no exudate, no erythema  and lips, buccal mucosa, and tongue normal  NECK: supple, thyroid normal size, non-tender, without nodularity LYMPH:  he has palpable lump on the right side of his neck  LUNGS: clear to auscultation and percussion with normal breathing effort HEART: regular rate & rhythm and no murmurs and no lower extremity edema ABDOMEN:abdomen soft, non-tender and normal bowel sounds Musculoskeletal:no cyanosis of digits and no clubbing  PSYCH: alert & oriented x 3 with fluent speech NEURO: no focal motor/sensory deficits  LABORATORY DATA:  I have reviewed the data as listed Lab Results  Component Value Date   WBC 8.9 11/14/2014   HGB 14.6 11/14/2014   HCT 44.1 11/14/2014   MCV 84.5 11/14/2014   PLT 176 11/14/2014   Lab Results  Component Value Date   NA 138 12/06/2014   K 3.6 12/06/2014   CL 99 12/06/2014   CO2 27 12/06/2014    RADIOGRAPHIC STUDIES: I have personally reviewed the radiological images as listed and agreed with the findings in the report. Ct Soft Tissue Neck W Contrast  Result Date: 08/22/2015 CLINICAL DATA:  Neck mass on the right. Creatinine was obtained on site at [facility] at [address] Results: Creatinine 1.0 mg/dL. EXAM: CT NECK WITH CONTRAST TECHNIQUE: Multidetector CT imaging of the neck was performed using the standard protocol following the bolus administration of intravenous contrast. CONTRAST:  75 mL Isovue-300 IV COMPARISON:  None. FINDINGS: Pharynx and larynx: Negative for pharyngeal mass. Right tonsil is. Tongue base intact. Normal vocal cords without mass lesion. Airway intact. Salivary glands: Parotid and submandibular glands normal bilaterally. Thyroid: Negative Lymph nodes: Right lower neck lymph node mass at the level the lower thyroid cartilage. This has central necrotic low-density and irregular margins. This is located just below the sternocleidomastoid muscle. There is likely a cluster of lymph nodes this area, overall measuring 38 x 20 mm.  These are malignant appearing lymph nodes. No other malignant appearing nodes. Right level 2 node 6.3 mm. Left level 2 lymph node 7.2 mm. Vascular: Extensive carotid artery calcification bilaterally. Carotid artery and jugular vein patent bilaterally. Limited intracranial: Negative Visualized orbits: Negative Mastoids and visualized paranasal sinuses: Air-fluid level right maxillary sinus. Mastoid sinus clear bilaterally. Remaining sinuses clear. Skeleton: Cervical spondylosis. Negative for fracture. Sclerotic lesion right T5 vertebral body extending into the pedicle of indeterminate etiology. Possible bone island. Sclerotic metastatic disease considered less likely. No lytic lesion. Upper chest: Lung apices clear. No adenopathy in the  upper mediastinum. Coronary calcification. Probable coronary stent. IMPRESSION: Malignant adenopathy right supraclavicular region compatible with metastatic disease. Biopsy recommended. No definite pharyngeal mass lesion identified by CT. Direct visualization of mucosa recommended. Atherosclerotic disease in the coronary arteries and carotid arteries. Electronically Signed   By: Franchot Gallo M.D.   On: 08/22/2015 13:18   Nm Pet Image Initial (pi) Skull Base To Thigh  Result Date: 08/29/2015 CLINICAL DATA:  Initial treatment strategy for head neck cancer. Additional history of prostate cancer. EXAM: NUCLEAR MEDICINE PET SKULL BASE TO THIGH TECHNIQUE: 12.5 mCi F-18 FDG was injected intravenously. Full-ring PET imaging was performed from the skull base to thigh after the radiotracer. CT data was obtained and used for attenuation correction and anatomic localization. FASTING BLOOD GLUCOSE:  Value: 147 mg/dl COMPARISON:  Contrast-enhanced neck CT 08/22/2015.  No prior PET-CT. FINDINGS: NECK Again noted is an enlarged right sided cervical lymph node which is posterior to the sternocleidomastoid muscle immediately above the level of the cricoid cartilage (level 5A), which currently  measures approximately 2.1 x 4.1 cm (image 48 of series 4) and is hypermetabolic (SUVmax = 8.1). No other discrete hypermetabolic lesion identified in the head and neck to indicate a primary malignancy. CHEST No hypermetabolic mediastinal or hilar nodes. No suspicious pulmonary nodules on the CT scan. There is aortic atherosclerosis, as well as atherosclerosis of the great vessels of the mediastinum and the coronary arteries, including calcified atherosclerotic plaque in the left main, left anterior descending, left circumflex and right coronary arteries. No acute consolidative airspace disease. No pleural effusions. ABDOMEN/PELVIS No abnormal hypermetabolic activity within the liver, pancreas, adrenal glands, or spleen. No hypermetabolic lymph nodes in the abdomen or pelvis. Mild diffuse low attenuation throughout the hepatic parenchyma, compatible with hepatic steatosis. Trace amount of subtle high attenuation lying dependently in the gallbladder may reflect tiny partially calcified gallstones or biliary sludge. No findings to suggest acute cholecystitis at this time. In the upper pole the right kidney there is a 5.1 x 3.8 cm low-attenuation lesion which is incompletely characterized, but demonstrates no hypermetabolism, likely a cyst. Aortic atherosclerosis. Postoperative changes in the proximal sigmoid colon, suggestive of prior partial sigmoidectomy. Numerous colonic diverticulae are noted throughout the colon, without surrounding inflammatory changes to suggest an acute diverticulitis at this time. Although the prostate gland is morphologically unremarkable in appearance, there is subtle asymmetric hypermetabolism in the right peripheral zone (SUVmax = 3.9). SKELETON No focal hypermetabolic activity to suggest skeletal metastasis. IMPRESSION: 1. Right-sided cervical (level 5A) lymphadenopathy with malignant range metabolic activity. No discrete primary malignancy otherwise identified. 2. No metastatic disease  in the chest, abdomen or pelvis. 3. Subtle asymmetric hypermetabolism in the right peripheral zone of the prostate gland. Given the patient's prior history prostate cancer, correlation with PSA levels and physical examination is recommended. 4. Hepatic steatosis. 5. Aortic atherosclerosis, in addition to left main and 3 vessel coronary artery disease. Assessment for potential risk factor modification, dietary therapy or pharmacologic therapy may be warranted, if clinically indicated. 6. Cholelithiasis and/or biliary sludge, without evidence of acute cholecystitis at this time. 7. Colonic diverticulosis without evidence of acute diverticulitis at this time. 8. Additional incidental findings, as above. Electronically Signed   By: Vinnie Langton M.D.   On: 08/29/2015 11:16    ASSESSMENT:  Newly diagnosed squamous cell carcinoma of the Head & Neck, HPV N/A  PLAN:  Squamous cell skin cancer I suspect a squamous cell carcinoma could possibly originated from his skin. PET/CT scan showed no definitive  primary source within the head and neck region. The patient had history of skin cancer removed and I recommend further information from his dermatologist. We will get his case discussed at the ENT tumor board next week. For now, I do not felt that his history necessary to recommend concurrent chemotherapy. He has appointment to meet with radiation oncologist. I will get him set up to follow with dental specialist for oral evaluation, nutritionist and physical therapist  Cancer of head, face, or neck lymph nodes, secondary ([place]) As mentioned above, the abnormal lymph node enlargement with squamous cell carcinoma is of unknown primary but could have originated from history of skin cancer He is asymptomatic. We will get his case presented at the next ENT tumor board.  CAD S/P percutaneous coronary angioplasty He is asymptomatic with no further signs and symptoms of angina. Continue medical  management  PMR (polymyalgia rheumatica) ([facility]) He is asymptomatic and has completed prednisone taper  Hyperlipidemia He has poorly controlled hyperlipidemia. This is likely related to poorly controlled diabetes. I would defer to his primary care doctor for further management  Diabetes mellitus with diabetic neuropathy, with long-term current use of insulin ([place]) The patient has poorly controlled diabetes with peripheral neuropathy. He is a poor candidate for systemic chemotherapy  History of prostate cancer According to the patient, after ablation for prostate cancer, his PSA is nearly undetectable. I would defer to his urologist for further management    Orders Placed This Encounter  Procedures  . Ambulatory Referral to Dentistry (specifically to Dr. Enrique Sack)    Referral Priority:   Routine    Referral Type:   Consultation    Referral Reason:   Specialty Services Required    Requested Specialty:   Dental General Practice    Number of Visits Requested:   1  . Ambulatory Referral to Physical Therapy    Referral Priority:   Routine    Referral Type:   Physical Medicine    Referral Reason:   Specialty Services Required    Requested Specialty:   Physical Therapy    Number of Visits Requested:   1  . Amb Referral to Nutrition and Diabetic Education (specifically to Ernestene Kiel)    Referral Priority:   Routine    Referral Type:   Consultation    Referral Reason:   Specialty Services Required    Number of Visits Requested:   1    All questions were answered. The patient knows to call the clinic with any problems, questions or concerns. I spent 55 minutes counseling the patient face to face. The total time spent in the appointment was 60 minutes and more than 50% was on counseling.     [facility], Chanita Boden, MD 09/03/15 5:33 PM

## 2015-09-03 NOTE — Progress Notes (Addendum)
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 Cancer of head, face, or neck lymph nodes, secondary (HCC) 196.0 C77.0 Ambulatory referral to ENT     Ambulatory referral to Social Work     Referral to Neuro Rehab     Basic metabolic panel     CBC with Differential     TSH  2. Other malaise 780.79 R53.81 TSH       HISTORY OF PRESENT ILLNESS::Osama Viona Gilmore Senesac is a 70 y.o. male who presented with bloody nasal congestion that resolved with ABX and nasal spray around Jan 2017.  He then had PNA in February which resolved with treatment.  1-2 weeks ago he noted a Right neck mass.  He ultimately saw Dr Tami Ribas who biopsied it, he had a fine needle aspiration of the right neck was positive for malignant cells, squamous cell carcinoma present. I don't see documentation thus far of p16 status or laryngoscopy/ EUA / mucosal biopsies to look for a primary lesion.  He had a PET scan 08/29/2015, revealing right-sided cervical (level 5A) lymphadenopathy with malignant range metabolic activity and no discrete primary malignancy otherwise identified, no metastatic disease in the chest, abdomen, or pelvis, and subtle asymmetric hypermetabolism in the right peripheral zone of the prostate gland.  Patient denies prior skin cancers, but had sun damaged areas "taken care of" over his forehead/ scalp in the past.  He reports he reports quitting smoking in 1965.  No chewing tobacco or ETOH abuse.   No weight loss, no swallowing issues. No throat or ear pain.   PREVIOUS RADIATION THERAPY: No  PAST MEDICAL HISTORY:  has a past medical history of CAD (coronary artery disease); Cancer ([place]);  Diverticulosis of colon; History of colon polyps; Hyperlipidemia; Hypertension; Myocardial infarction ([place]); Nonproliferative diabetic retinopathy ([place]); Obesity; Peripheral neuropathy ([place]); Prostate cancer [facility]); RBBB (right bundle branch block); Skin cancer; and Type 2 diabetes mellitus ([place]).    PAST SURGICAL HISTORY: Past Surgical History:  Procedure Laterality Date  . APPENDECTOMY    . CARDIAC CATHETERIZATION N/A 11/13/2014   Procedure: Left Heart Cath and Coronary Angiography;  Surgeon: [name] [name], MD;  Location: [place] [facility];  Service: Cardiovascular;  Laterality: N/A;  . CARDIAC CATHETERIZATION  11/13/2014   Procedure: Coronary Stent Intervention;  Surgeon: [name] [name], MD;  Location: [place] [facility];  Service: Cardiovascular;;  . CARDIOVASCULAR STRESS TEST  01-17-2012  dr [name]   Normal perfusion study/ no ischemia or infarction/  normal LVF and wall motion, ef 54%  . CORONARY ANGIOPLASTY WITH STENT PLACEMENT  05/04/2006   dr [name]   Severe 2-vessel obstructive CAD/  normal LVF/  PCI with DES to proxima and mid CFX and mRCA (total 3 DES)  . CRYOABLATION N/A 04/01/2014   Procedure: CRYO ABLATION PROSTATE;  Surgeon: [name], MD;  Location: [facility];  Service: Urology;  Laterality: N/A;  . INSERTION OF SUPRAPUBIC CATHETER N/A 04/01/2014   Procedure: INSERTION OF SUPRAPUBIC CATHETER;  Surgeon: [name], MD;  Location[facility];  Service: Urology;  Laterality: N/A;  suprapubic   . PARTIAL COLECTOMY      FAMILY HISTORY: family  history includes Cancer in his father; Diverticulitis in his mother; Heart failure in his father.  SOCIAL HISTORY:  reports that he quit smoking about 52 years ago. He has never used smokeless tobacco. He reports that he does not drink alcohol or use drugs.  ALLERGIES: Review of patient's allergies indicates no known allergies.  MEDICATIONS:  Current Outpatient Prescriptions  Medication  Sig Dispense Refill  . ascorbic Acid (VITAMIN C) 500 MG CPCR Take 500 mg by mouth daily.    [name] aspirin 81 MG tablet Take 81 mg by mouth daily.     . clopidogrel (PLAVIX) 75 MG tablet Take 1 tablet (75 mg total) by mouth daily. 90 tablet 0  . Diphenhyd-Hydrocort-Nystatin (FIRST-DUKES MOUTHWASH) SUSP Use as directed in the mouth or throat as needed.    . fluticasone (FLONASE) 50 MCG/ACT nasal spray Place 2 sprays into both nostrils daily.    . hydrochlorothiazide (HYDRODIURIL) 25 MG tablet Take 25 mg by mouth daily.      . insulin aspart protamine- aspart (NOVOLOG MIX 70/30) (70-30) 100 UNIT/ML injection Inject into the skin daily with supper. 1 unit before large meal    . insulin glargine (LANTUS) 100 UNIT/ML injection Inject 100 Units into the skin daily.     . Iron-Vitamin C (VITRON-C) 65-125 MG TABS Take by mouth daily.    . simvastatin (ZOCOR) 40 MG tablet Take 40 mg by mouth daily.   3  . UNABLE TO FIND Place in ear(s) daily as needed. Med Name: Flyocinolone.   Ear drops as needed for ears itching     No current facility-administered medications for this encounter.     REVIEW OF SYSTEMS:  15 point review of systems was obtained by nursing and reviewed by me.     PHYSICAL EXAM:  height is 5\' 8"  (1.727 m) and weight is 201 lb 14.4 oz (91.6 kg). His temperature is 98.2 F (36.8 C). His blood pressure is 141/68 (abnormal) and his pulse is 77.   General: Alert and oriented, in no acute distress HEENT: Head is normocephalic. Extraocular movements are intact. Oropharynx is clear. Slight left ptosis. Neck: Neck is supple, with a firm mass in the right level 4 region. Difficult to measure - there is a marble sized raised portion that is most notable. Otherwise no palpable cervical or supraclavicular lymphadenopathy. Heart: Regular in rate and rhythm with no murmurs, rubs, or gallops. Chest: Clear to auscultation bilaterally, with no rhonchi, wheezes, or rales. Abdomen: Soft, nontender,  nondistended, with no rigidity or guarding. Extremities: No cyanosis or edema. Lymphatics: see Neck Exam Skin: No concerning lesions. Musculoskeletal: symmetric strength and muscle tone throughout. Neurologic: slight left ptosis, otherwise, Cranial nerves II through XII are grossly intact. No obvious focalities. Speech is fluent. Coordination is intact. Psychiatric: Judgment and insight are intact. Affect is appropriate.   ECOG = 1  0 - Asymptomatic (Fully active, able to carry on all predisease activities without restriction)  1 - Symptomatic but completely ambulatory (Restricted in physically strenuous activity but ambulatory and able to carry out work of a light or sedentary nature. For example, light housework, office work)  2 - Symptomatic, <50% in bed during the day (Ambulatory and capable of all self care but unable to carry out any work activities. Up and about more than 50% of waking hours)  3 - Symptomatic, >50% in bed, but not bedbound (Capable of only limited self-care, confined to bed or chair 50% or more of waking hours)  4 - Bedbound (  Completely disabled. Cannot carry on any self-care. Totally confined to bed or chair)  5 - Death   Eustace Pen MM, Creech RH, Tormey DC, et al. [phone]). "Toxicity and response criteria of the [facility]". [place] Oncol. 5 (6): 649-55   LABORATORY DATA:  Lab Results  Component Value Date   WBC 8.9 11/14/2014   HGB 14.6 11/14/2014   HCT 44.1 11/14/2014   MCV 84.5 11/14/2014   PLT 176 11/14/2014   CMP     Component Value Date/Time   NA 138 12/06/2014 0950   K 3.6 12/06/2014 0950   CL 99 12/06/2014 0950   CO2 27 12/06/2014 0950   GLUCOSE 308 (H) 12/06/2014 0950   BUN 19 12/06/2014 0950   CREATININE 1.28 (H) 12/06/2014 0950   CALCIUM 9.2 12/06/2014 0950   GFRNONAA >60 11/14/2014 0352   GFRAA >60 11/14/2014 0352       No results found for: TSH  RADIOGRAPHY: Ct Soft Tissue Neck W Contrast  Result Date:  08/22/2015 CLINICAL DATA:  Neck mass on the right. Creatinine was obtained on site at [facility] at [address] Results: Creatinine 1.0 mg/dL. EXAM: CT NECK WITH CONTRAST TECHNIQUE: Multidetector CT imaging of the neck was performed using the standard protocol following the bolus administration of intravenous contrast. CONTRAST:  75 mL Isovue-300 IV COMPARISON:  None. FINDINGS: Pharynx and larynx: Negative for pharyngeal mass. Right tonsil is. Tongue base intact. Normal vocal cords without mass lesion. Airway intact. Salivary glands: Parotid and submandibular glands normal bilaterally. Thyroid: Negative Lymph nodes: Right lower neck lymph node mass at the level the lower thyroid cartilage. This has central necrotic low-density and irregular margins. This is located just below the sternocleidomastoid muscle. There is likely a cluster of lymph nodes this area, overall measuring 38 x 20 mm. These are malignant appearing lymph nodes. No other malignant appearing nodes. Right level 2 node 6.3 mm. Left level 2 lymph node 7.2 mm. Vascular: Extensive carotid artery calcification bilaterally. Carotid artery and jugular vein patent bilaterally. Limited intracranial: Negative Visualized orbits: Negative Mastoids and visualized paranasal sinuses: Air-fluid level right maxillary sinus. Mastoid sinus clear bilaterally. Remaining sinuses clear. Skeleton: Cervical spondylosis. Negative for fracture. Sclerotic lesion right T5 vertebral body extending into the pedicle of indeterminate etiology. Possible bone island. Sclerotic metastatic disease considered less likely. No lytic lesion. Upper chest: Lung apices clear. No adenopathy in the upper mediastinum. Coronary calcification. Probable coronary stent. IMPRESSION: Malignant adenopathy right supraclavicular region compatible with metastatic disease. Biopsy recommended. No definite pharyngeal mass lesion identified by CT. Direct visualization of mucosa recommended.  Atherosclerotic disease in the coronary arteries and carotid arteries. Electronically Signed   By: Franchot Gallo M.D.   On: 08/22/2015 13:18   Nm Pet Image Initial (pi) Skull Base To Thigh  Result Date: 08/29/2015 CLINICAL DATA:  Initial treatment strategy for head neck cancer. Additional history of prostate cancer. EXAM: NUCLEAR MEDICINE PET SKULL BASE TO THIGH TECHNIQUE: 12.5 mCi F-18 FDG was injected intravenously. Full-ring PET imaging was performed from the skull base to thigh after the radiotracer. CT data was obtained and used for attenuation correction and anatomic localization. FASTING BLOOD GLUCOSE:  Value: 147 mg/dl COMPARISON:  Contrast-enhanced neck CT 08/22/2015.  No prior PET-CT. FINDINGS: NECK Again noted is an enlarged right sided cervical lymph node which is posterior to the sternocleidomastoid muscle immediately above the level of the cricoid cartilage (level 5A), which currently measures approximately 2.1 x 4.1 cm (image 48 of  series 4) and is hypermetabolic (SUVmax = 8.1). No other discrete hypermetabolic lesion identified in the head and neck to indicate a primary malignancy. CHEST No hypermetabolic mediastinal or hilar nodes. No suspicious pulmonary nodules on the CT scan. There is aortic atherosclerosis, as well as atherosclerosis of the great vessels of the mediastinum and the coronary arteries, including calcified atherosclerotic plaque in the left main, left anterior descending, left circumflex and right coronary arteries. No acute consolidative airspace disease. No pleural effusions. ABDOMEN/PELVIS No abnormal hypermetabolic activity within the liver, pancreas, adrenal glands, or spleen. No hypermetabolic lymph nodes in the abdomen or pelvis. Mild diffuse low attenuation throughout the hepatic parenchyma, compatible with hepatic steatosis. Trace amount of subtle high attenuation lying dependently in the gallbladder may reflect tiny partially calcified gallstones or biliary sludge. No  findings to suggest acute cholecystitis at this time. In the upper pole the right kidney there is a 5.1 x 3.8 cm low-attenuation lesion which is incompletely characterized, but demonstrates no hypermetabolism, likely a cyst. Aortic atherosclerosis. Postoperative changes in the proximal sigmoid colon, suggestive of prior partial sigmoidectomy. Numerous colonic diverticulae are noted throughout the colon, without surrounding inflammatory changes to suggest an acute diverticulitis at this time. Although the prostate gland is morphologically unremarkable in appearance, there is subtle asymmetric hypermetabolism in the right peripheral zone (SUVmax = 3.9). SKELETON No focal hypermetabolic activity to suggest skeletal metastasis. IMPRESSION: 1. Right-sided cervical (level 5A) lymphadenopathy with malignant range metabolic activity. No discrete primary malignancy otherwise identified. 2. No metastatic disease in the chest, abdomen or pelvis. 3. Subtle asymmetric hypermetabolism in the right peripheral zone of the prostate gland. Given the patient's prior history prostate cancer, correlation with PSA levels and physical examination is recommended. 4. Hepatic steatosis. 5. Aortic atherosclerosis, in addition to left main and 3 vessel coronary artery disease. Assessment for potential risk factor modification, dietary therapy or pharmacologic therapy may be warranted, if clinically indicated. 6. Cholelithiasis and/or biliary sludge, without evidence of acute cholecystitis at this time. 7. Colonic diverticulosis without evidence of acute diverticulitis at this time. 8. Additional incidental findings, as above. Electronically Signed   By: Vinnie Langton M.D.   On: 08/29/2015 11:16    IMPRESSION/PLAN:  This is a delightful patient with head and neck cancer of unknown primary. I do recommend radiotherapy for this patient.  I don't see documentation thus far of p16 status or laryngoscopy/ EUA / mucosal biopsies to look for a  primary lesion. Patient would like to complete workup here in [place], closer to home.  Although I told him we tend to keep patients with their referring surgeon, he requests referral to local surgeon in [place] which is more feasible for him, transporation-wise. Patient needs p16, EBV testing on his cancer cells - this may require re- biopsy of his neck, and EUA of mucosal axis with targeted biopsies to see if a primary lesion can be determined. I spoke w/ Dr Constance Holster about this. He will see him soon.  We discussed the potential risks, benefits, and side effects of radiotherapy. We talked in detail about acute and late effects. We discussed that some of the most bothersome acute effects may be mucositis, dysgeusia, salivary changes, skin irritation, hair loss, dehydration, weight loss and fatigue. We talked about late effects which include but are not necessarily limited to dysphagia, hypothyroidism, nerve injury, spinal cord injury, xerostomia, trismus, and neck edema. No guarantees of treatment were given. A consent form was signed and placed in the patient's medical record. The  patient is enthusiastic about proceeding with treatment. I look forward to participating in the patient's care.    Simulation (treatment planning) will take place after workup is complete and dentistry releases him.  We also discussed that the treatment of head and neck cancer is a multidisciplinary process to maximize treatment outcomes and quality of life. For this reasons the following referrals have been or will be made:  He has Medical oncology to discuss chemotherapy - patient understands that this is unlikely    Dentistry for dental evaluation, possible extractions in the radiation fields, and /or advice on reducing risk of cavities, osteoradionecrosis, or other oral issues.    Nutritionist for nutrition support during and after treatment.    Speech language pathology for swallowing and/or speech therapy.    Social work for  social support.     Physical therapy due to risk of lymphedema in neck and deconditioning.    Baseline labs including TSH.  Nonspecific findings in PET in region of prostate - refer back to Dr. Gaynelle Arabian of urology for assessment as clinically indicated. __________________________________________   Eppie Gibson, MD

## 2015-09-04 ENCOUNTER — Telehealth: Payer: Self-pay | Admitting: *Deleted

## 2015-09-04 ENCOUNTER — Other Ambulatory Visit: Payer: Self-pay | Admitting: Internal Medicine

## 2015-09-04 NOTE — Telephone Encounter (Signed)
  Oncology Nurse Navigator Documentation  Navigator Location: [facility] (09/04/15 0820) Navigator Encounter Type: Letter/Fax/Email (09/04/15 0820)                       Faxed patient signed 'Request & Authorization for Use/Disclosure of [place]' to [facility], for notes/services from January 2012 to present.  Receipt confirmation received.  [name], RN, BSN, [place] at [place] [phone]                       Time Spent with Patient: 30 (09/04/15 0820)

## 2015-09-04 NOTE — Assessment & Plan Note (Signed)
According to the patient, after ablation for prostate cancer, his PSA is nearly undetectable. I would defer to his urologist for further management

## 2015-09-04 NOTE — Assessment & Plan Note (Signed)
The patient has poorly controlled diabetes with peripheral neuropathy. He is a poor candidate for systemic chemotherapy

## 2015-09-04 NOTE — Telephone Encounter (Signed)
  Oncology Nurse [name], spoke with Benjamin Barr, informed her of pending referral from Dr. Isidore Moos, requested that at patient's request, his care be transferred from Dr. Shirlee Limerick to Dr. Constance Holster, [name] or [name].  She understands patient needs next available appt.  [name], RN, BSN, [place] at [facility] [phone]   Navigator Location: [place] [id number]/27/17 1020) Navigator Encounter Type: Telephone (09/04/15 1020) Telephone: Outgoing Call (09/04/15 1020)             Barriers/Navigation Needs: Coordination of Care (09/04/15 1020)                          Time Spent with Patient: 15 (09/04/15 1020)

## 2015-09-04 NOTE — Assessment & Plan Note (Signed)
He has poorly controlled hyperlipidemia. This is likely related to poorly controlled diabetes. I would defer to his primary care doctor for further management

## 2015-09-05 ENCOUNTER — Telehealth: Payer: Self-pay | Admitting: *Deleted

## 2015-09-05 ENCOUNTER — Other Ambulatory Visit: Payer: Self-pay | Admitting: Radiation Oncology

## 2015-09-05 DIAGNOSIS — C770 Secondary and unspecified malignant neoplasm of lymph nodes of head, face and neck: Secondary | ICD-10-CM

## 2015-09-05 NOTE — Addendum Note (Signed)
Encounter addended by: Eppie Gibson, MD on: 09/05/2015  2:44 PM<BR>    Actions taken: Sign clinical note

## 2015-09-05 NOTE — Telephone Encounter (Signed)
  Oncology Nurse Navigator Documentation  Informed Benjamin Barr that Dr. Isidore Moos is referring him to his urologist Dr. Gaynelle Arabian for follow-up r/t recent PET showing hypermetabolism in his prostate.  He voiced understanding.  [name], RN, BSN, [place] at [facility] [phone]  Navigator Location: [place] [phone]/28/17 1625) Navigator Encounter Type: Telephone (09/05/15 1625) Telephone: Jerilee Hoh Confirmation/Clarification (09/05/15 1625)             Barriers/Navigation Needs: Coordination of Care (09/05/15 1625)                          Time Spent with Patient: 15 (09/05/15 1625)

## 2015-09-05 NOTE — Telephone Encounter (Signed)
  Oncology Nurse Navigator Documentation  Called Mr. Pallett to inform him he has an appointment with Dr. Trude Mcburney ENT, on Monday at 4:00 PM.  He voiced understanding.  Dr. Isidore Moos informed.  [name], RN, BSN, [place] at [facility] [phone]   Navigator Location: [place] (09/05/15 0915) Navigator Encounter Type: Telephone (09/05/15 0915) Telephone: Jerilee Hoh Confirmation/Clarification (09/05/15 0915)             Barriers/Navigation Needs: Coordination of Care (09/05/15 0915)                          Time Spent with Patient: 15 (09/05/15 0915)

## 2015-09-08 ENCOUNTER — Ambulatory Visit (HOSPITAL_COMMUNITY): Payer: Self-pay | Admitting: Dentistry

## 2015-09-08 ENCOUNTER — Ambulatory Visit: Payer: Medicare Other | Admitting: Hematology and Oncology

## 2015-09-08 ENCOUNTER — Encounter (HOSPITAL_COMMUNITY): Payer: Self-pay | Admitting: Dentistry

## 2015-09-08 VITALS — BP 143/103 | HR 76 | Temp 98.5°F

## 2015-09-08 DIAGNOSIS — IMO0002 Reserved for concepts with insufficient information to code with codable children: Secondary | ICD-10-CM

## 2015-09-08 DIAGNOSIS — K0530 Chronic periodontitis, unspecified: Secondary | ICD-10-CM

## 2015-09-08 DIAGNOSIS — K029 Dental caries, unspecified: Secondary | ICD-10-CM

## 2015-09-08 DIAGNOSIS — C770 Secondary and unspecified malignant neoplasm of lymph nodes of head, face and neck: Secondary | ICD-10-CM

## 2015-09-08 DIAGNOSIS — C4492 Squamous cell carcinoma of skin, unspecified: Secondary | ICD-10-CM | POA: Diagnosis present

## 2015-09-08 DIAGNOSIS — K0889 Other specified disorders of teeth and supporting structures: Secondary | ICD-10-CM

## 2015-09-08 DIAGNOSIS — K036 Deposits [accretions] on teeth: Secondary | ICD-10-CM

## 2015-09-08 DIAGNOSIS — Z01818 Encounter for other preprocedural examination: Secondary | ICD-10-CM

## 2015-09-08 DIAGNOSIS — K045 Chronic apical periodontitis: Secondary | ICD-10-CM

## 2015-09-08 DIAGNOSIS — M264 Malocclusion, unspecified: Secondary | ICD-10-CM

## 2015-09-08 DIAGNOSIS — K08409 Partial loss of teeth, unspecified cause, unspecified class: Secondary | ICD-10-CM

## 2015-09-08 NOTE — Patient Instructions (Signed)

## 2015-09-08 NOTE — Progress Notes (Signed)
DENTAL CONSULTATION  Date of Consultation:  09/08/2015 Patient Name:   Benjamin Barr Date of Birth:   14-Apr-1945 Medical Record Number: [id number]  VITALS: BP (!) 143/103   Pulse 76   Temp 98.5 F (36.9 C) (Oral)   CHIEF COMPLAINT: Patient referred by Dr. Alvy Bimler for a dental consultation.   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 Patient underwent a recent PET scan with no apparent primary tumor noted. Patient is now seen as part of a medically necessary preradiation therapy dental protocol examination.  The patient currently denies acute toothaches, swellings, or abscesses. Patient was last seen approximately 4 years ago to have a dental restoration placed. This was with Dr. Delphia Grates. Patient indicates that his last periodontal therapy was "a long time ago".  Prior to seeing Dr. Johnnye Sima, the patient had gone to Dr. Humberto Leep in [place], [place] and Dr. Iran Planas in [place], [place].  Patient, however, indicates that he does not seek regular dental care. Patient has multiple root canal therapies that were performed by Dr. Patriciaann Clan, an endodontist in [place], [place].   Patient denies dental phobia but "does not like the dentist".  PROBLEM LIST: Patient Active Problem List   Diagnosis Date Noted  . History of prostate cancer 09/03/2015  . Squamous cell skin cancer 09/03/2015  . Cancer of head, face, or neck lymph nodes, secondary ([place]) 09/03/2015  . Unstable angina ([place]) 11/14/2014  . Abnormal nuclear stress test 11/13/2014  . Angina pectoris ([place]) 11/13/2014  . PMR (polymyalgia rheumatica) (HCC) 11/02/2014  . Chest pain with moderate risk for cardiac etiology 10/31/2014  . Diabetes mellitus with diabetic neuropathy, with long-term current use of insulin ([place]) 12/28/2010  . CAD S/P percutaneous coronary angioplasty   . Hypertension   . Hyperlipidemia   . Obesity     PMH: Past Medical History:   Diagnosis Date  . CAD (coronary artery disease)    a. 2008 PCI: Taxus DES  to mRCA, prox & mid LCFX;  b. 10/2014 MV: extensive ischemia in the RCA territory and small area of anterior ischemia;  c. 11/2014 PCI: LM nl, LAD 30ost/prox, 80d, LCX 90ost (3.0x12 Promus Prem DES), OM2 30, RCA 37m (3.0x12 Promus Prem DES), 59m, RPDA 90 (PTCA), EF 55-65%.  . Cancer ([place])    Head & neck ca  . Diverticulosis of colon   . History of colon polyps    2014--  hyperplastic, adenoma, and benign polyps  . Hyperlipidemia   . Hypertension   . Myocardial infarction ([place])   . Nonproliferative diabetic retinopathy ([place])   . Obesity   . Peripheral neuropathy ([place])   . Prostate cancer ([place])   . RBBB (right bundle branch block)   . Skin cancer   . Type 2 diabetes mellitus (HCC)     PSH: Past Surgical History:  Procedure Laterality Date  . APPENDECTOMY    . CARDIAC CATHETERIZATION N/A 11/13/2014   Procedure: Left Heart Cath and Coronary Angiography;  Surgeon: [name] [name], MD;  Location: [facility];  Service: Cardiovascular;  Laterality: N/A;  . CARDIAC CATHETERIZATION  11/13/2014   Procedure: Coronary Stent Intervention;  Surgeon: [name] [name], MD;  Location: [facility];  Service: Cardiovascular;;  . CARDIOVASCULAR STRESS TEST  01-17-2012  dr [name]   Normal perfusion study/ no ischemia or infarction/  normal LVF and wall motion, ef 54%  . CORONARY ANGIOPLASTY WITH STENT PLACEMENT  05/04/2006   dr [name]   Severe 2-vessel obstructive CAD/  normal LVF/  PCI with DES to proxima and mid CFX and mRCA (total 3 DES)  . CRYOABLATION N/A 04/01/2014   Procedure: CRYO ABLATION PROSTATE;  Surgeon: [name], MD;  Location: [facility];  Service: Urology;  Laterality: N/A;  . INSERTION OF SUPRAPUBIC CATHETER N/A 04/01/2014   Procedure: INSERTION OF SUPRAPUBIC CATHETER;  Surgeon: [name], MD;  Location: [facility];  Service: Urology;  Laterality:  N/A;  suprapubic   . PARTIAL COLECTOMY      ALLERGIES: No Known Allergies  MEDICATIONS: Current Outpatient Prescriptions  Medication Sig Dispense Refill  . ascorbic Acid (VITAMIN C) 500 MG CPCR Take 500 mg by mouth daily.    [name] aspirin 81 MG tablet Take 81 mg by mouth daily.     . clopidogrel (PLAVIX) 75 MG tablet Take 1 tablet (75 mg total) by mouth daily. 90 tablet 0  . Diphenhyd-Hydrocort-Nystatin (FIRST-DUKES MOUTHWASH) SUSP Use as directed in the mouth or throat as needed.    . Ferrous Sulfate (IRON) 325 (65 Fe) MG TABS Take 325 mg by mouth.    . fluticasone (FLONASE) 50 MCG/ACT nasal spray Place 2 sprays into both nostrils daily.    . insulin aspart protamine- aspart (NOVOLOG MIX 70/30) (70-30) 100 UNIT/ML injection Inject into the skin daily with supper. 1 unit before large meal    . insulin glargine (LANTUS) 100 UNIT/ML injection Inject 100 Units into the skin daily.     . simvastatin (ZOCOR) 40 MG tablet Take 40 mg by mouth daily.   3  . UNABLE TO FIND Place in ear(s) daily as needed. Med Name: Flyocinolone.   Ear drops as needed for ears itching    . hydrochlorothiazide (HYDRODIURIL) 25 MG tablet Take 25 mg by mouth daily.       No current facility-administered medications for this visit.     LABS: Lab Results  Component Value Date   WBC 8.9 11/14/2014   HGB 14.6 11/14/2014   HCT 44.1 11/14/2014   MCV 84.5 11/14/2014   PLT 176 11/14/2014      Component Value Date/Time   NA 138 12/06/2014 0950   K 3.6 12/06/2014 0950   CL 99 12/06/2014 0950   CO2 27 12/06/2014 0950   GLUCOSE 308 (H) 12/06/2014 0950   BUN 19 12/06/2014 0950   CREATININE 1.28 (H) 12/06/2014 0950   CALCIUM 9.2 12/06/2014 0950   GFRNONAA >60 11/14/2014 0352   GFRAA >60 11/14/2014 0352   Lab Results  Component Value Date   INR 1.03 11/07/2014   No results found for: PTT  SOCIAL HISTORY: Social History   Social History  . Marital status: Married    Spouse name: Neoma Laming  . Number of  children: 2  . Years of education: N/A   Occupational History  . sales     retired   Social History Main Topics  . Smoking status: Former Smoker    Quit date: 02/09/1963  . Smokeless tobacco: Never Used  . Alcohol use No  . Drug use: No  . Sexual activity: Not on file   Other Topics Concern  . Not on file   Social History Narrative  . No narrative on file    FAMILY HISTORY: Family History  Problem Relation Age of Onset  . Diverticulitis Mother   . Heart failure Father   . Cancer Father     prostate ca  REVIEW OF SYSTEMS: Reviewed with patient as per History of present illness. Psych: Patient denies dental phobia.  DENTAL HISTORY: CHIEF COMPLAINT: Patient referred by Dr. Alvy Bimler for a dental consultation.   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 Patient underwent a recent PET scan with no apparent primary tumor noted. Patient is now seen as part of a medically necessary preradiation therapy dental protocol examination.  The patient currently denies acute toothaches, swellings, or abscesses. Patient was last seen approximately 4 years ago to have a dental restoration placed. This was with Dr. Delphia Grates. Patient indicates that his last periodontal therapy was "a long time ago".  Prior to seeing Dr. Johnnye Sima, the patient had gone to Dr. Humberto Leep in [place], [place] and Dr. Iran Planas in [place], [place].  Patient, however, indicates that he does not seek regular dental care. Patient has multiple root canal therapies that were performed by Dr. Patriciaann Clan, an endodontist in [place], [place].   Patient denies dental phobia but "does not like the dentist".  DENTAL EXAMINATION: GENERAL: The patient is a well-developed, well-nourished male in no acute distress. HEAD AND NECK: Patient has right neck lymphadenopathy. There is no obvious left neck lymphadenopathy. The patient denies acute TMJ  symptoms. INTRAORAL EXAM: Patient has normal saliva. There is no evidence of oral abscess formation. DENTITION: The patient is missing tooth numbers 1, 12, 15, 16, 17, 21, 29, 31, and 32. PERIODONTAL: Patient has chronic periodontitis with plaque and calculus Relations, gingival recession, and tooth mobility. There is moderate bone loss noted.  DENTAL CARIES/SUBOPTIMAL RESTORATIONS: Multiple dental caries are noted as per dental charting form. ENDODONTIC: Patient currently denies acute pulpitis symptoms. Patient has had previous root canal therapy associated with tooth numbers 19, 20, and 30. Patient appears to have persistent periapical pathology and radiolucency associated with the apex of tooth #30. This tooth is percussion and palpation negative. Patient has significant dental caries on tooth #3 that is impinging on the pulp. CROWN AND BRIDGE: Patient has multiple crown and bridge restorations noted as per dental charting form. PROSTHODONTIC: Patient denies having any partial dentures. OCCLUSION: Patient has a poor occlusal scheme secondary to multiple missing teeth, supra-eruption and drifting of the unopposed teeth into the edentulous areas.  RADIOGRAPHIC INTERPRETATION: An orthopantogram was taken and supplemented with a full series of dental radiographs. There are multiple missing teeth. Tooth numbers 19, 20, and 30 have had previous root canal therapy. There is persistent periapical radiolucency associated with the apex of tooth #30. Tooth #3 has extensive dental caries that is impinging on the pulp. Patient has moderate to severe bone loss. Radiographic calculus is noted. Dental caries are noted.  ASSESSMENTS: 1. Squamous cell carcinoma of the right neck-current unknown primary tumor 2. Preradiation therapy dental protocol 3. Chronic apical periodontitis 4. Dental caries 5. Chronic periodontitis with bone loss 6. Accretions 7. Gingival recession 8. Tooth mobility 9. Multiple missing  teeth 10. Supra-eruption and drifting of the unopposed teeth into the edentulous areas 11. Poor occlusal scheme and malocclusion 12. Current Plavix therapy with risk for bleeding with invasive dental procedures.  PLAN/RECOMMENDATIONS: 1. I discussed the risks, benefits, and complications of various treatment options with the patient in relationship to his medical and dental conditions, anticipated radiation therapy, and radiation therapy side effects to include xerostomia, radiation caries, trismus, mucositis, taste changes, gum and jawbone changes, and risk for infection, bleeding, and osteoradionecrosis. We discussed various treatment options to include  no treatment, multiple extractions with alveoloplasty, pre-prosthetic surgery as indicated, periodontal therapy, dental restorations, root canal therapy, crown and bridge therapy, implant therapy, and replacement of missing teeth as indicated. We also discussed referral to an oral surgeon for second opinion and extraction of indicated teeth. We also discussed receiving care with his primary dentist, Dr. Delphia Grates, for periodontal therapy and dental restorations as indicated. We also discussed fabrication of fluoride trays and scatter protection devices in Dental medicine and the patient agreed to the impressions-today. This was performed without complication. The patient currently wishes to proceed with referral to an oral surgeon for evaluation for extraction of tooth numbers 2, 3, ? 18, 23-26, and #30 with alveoloplasty as needed. This referral will be made once the ports and doses involving the upper right, lower left,  and lower right molars are further defined through discussion with Dr. Isidore Moos.  Patient will then also be referred to Dr. Johnnye Sima for initial periodontal therapy and dental restorations as indicated.  I will proceed with insertion of fluoride trays and scatter protection devices after the teeth have been extracted as indicated with the  oral surgeon.   2. Discussion of findings with medical team and coordination of future medical and dental care as needed.  I spent in excess of  120 minutes during the conduct of this consultation and >50% of this time involved direct face-to-face encounter for counseling and/or coordination of the patient's care.    Lenn Cal, DDS

## 2015-09-09 ENCOUNTER — Telehealth: Payer: Self-pay | Admitting: Cardiology

## 2015-09-09 ENCOUNTER — Ambulatory Visit: Payer: Medicare Other | Admitting: Physical Therapy

## 2015-09-09 NOTE — Telephone Encounter (Signed)
Given urgency of surgery he can hold plavix for one week prior to surgery and afterwards. He should remain on ASA throughout.   [name] MD, [facility]

## 2015-09-09 NOTE — Telephone Encounter (Signed)
Benjamin Barr @ [facility]  Patient has "a lot of cancer going on" - metastatic cancer of R lower neck  Dr. Constance Holster wants to do surgery ASAP -- laryngoscopy, esophagoscopy, nasopharyngeal scope, biopsies, possible tonsillectomy   Dr. Constance Holster wants to know if patient can hold plavix for procedures and if he can hold for a few weeks after  Will route to MD  Benjamin Barr said this is urgent

## 2015-09-10 ENCOUNTER — Ambulatory Visit: Payer: Self-pay | Admitting: Otolaryngology

## 2015-09-10 NOTE — Telephone Encounter (Signed)
LM for [facility] with medication instructions per Dr. [name]. Advised to call back if needed.

## 2015-09-10 NOTE — Telephone Encounter (Signed)
Routed to Dr. Constance Holster via [facility]

## 2015-09-11 ENCOUNTER — Ambulatory Visit: Payer: Self-pay | Admitting: Otolaryngology

## 2015-09-11 NOTE — H&P (Signed)
Progress Notes  Expand All Collapse All   Otolaryngology Office Note  HPI:   Benjamin Barr is a 70 y.o. male who presents as a consult  Patient.   Referring Provider: Acquanetta Belling  Chief complaint: Neck mass.  HPI: He noticed a small hard mass in the right lower neck about 2-1/2 weeks ago.  He had an FNA performed in [place] which was positive for squamous cell carcinoma.  CT scan and PET scan were also performed.  No evidence of a primary site.  He has been to the medical and radiation oncologist.  Referred here for evaluation to try to identify a primary site.  He does not smoke or drink.  He has no other symptoms related to this.  He has a history of heart disease for which he currently takes Plavix.  PMH/Meds/All/SocHx/FamHx/ROS:    Past Medical History       Past Medical History:  Diagnosis Date  . Blockage of coronary artery of heart ([place])   . Diabetes mellitus ([place])   . Prostate cancer [facility])        Past Surgical History        Past Surgical History:  Procedure Laterality Date  . COLONOSCOPY    . MOUTH SURGERY        No family history of bleeding disorders, wound healing problems or difficulty with anesthesia.    Social History   Social History        Social History  . Marital status: Married    Spouse name: N/A  . Number of children: N/A  . Years of education: N/A      Occupational History  . Not on file.       Social History Main Topics  . Smoking status: Never Smoker  . Smokeless tobacco: Not on file  . Alcohol use Not on file  . Drug use: Not on file  . Sexual activity: Not on file       Other Topics Concern  . Not on file      Social History Narrative  . No narrative on file       Current Outpatient Prescriptions:  .  aspirin 81 MG EC tablet *ANTIPLATELET*, Take by mouth., Disp: , Rfl:  .  clopidogrel (PLAVIX) 75 mg tablet  *ANTIPLATELET*, Take by mouth., Disp: , Rfl:  .  ferrous sulfate  (FERATAB) 325 (65 FE) MG tablet, Take by mouth., Disp: , Rfl:  .  fluticasone (FLONASE) 50 mcg/actuation nasal spray, 2 sprays by Nasal route., Disp: , Rfl:  .  hydroCHLOROthiazide (HYDRODIURIL) 25 MG tablet, TAKE 1 TABLET BY MOUTH EVERY DAY, Disp: , Rfl: 3 .  insulin asp prt-insulin aspart (NOVOLOG MIX 70/30) 100 unit/mL (70-30) Solution injection, Inject into the skin., Disp: , Rfl:  .  insulin glargine (LANTUS) 100 unit/mL injection, Inject 100 Units into the skin., Disp: , Rfl:  .  simvastatin (ZOCOR) 40 MG tablet, TAKE 1 TABLET BY MOUTH EVERY DAY AT BEDTIME, Disp: , Rfl: 3  A complete ROS was performed with pertinent positives/negatives noted in the HPI. The remainder of the ROS are negative.   Physical Exam:    Ht 1.727 m (5\' 8" )  Wt 90.7 kg (200 lb)  BMI 30.41 kg/m2   General:  Healthy and alert, in no distress, breathing easily. Normal affect. In a pleasant mood. Head: Normocephalic, atraumatic. No masses, or scars. Eyes: Pupils are equal, and reactive to light. Vision is grossly intact. No spontaneous or gaze nystagmus. Ears:  Ear canals are clear. Tympanic membranes are intact, with normal landmarks and the middle ears are clear and healthy. Hearing: Grossly normal. Nose: Nasal cavities are clear with healthy mucosa, no polyps or exudate.Airways are patent. Face: No masses or scars, facial nerve function is symmetric. Oral Cavity: No mucosal abnormalities are noted. Tongue with normal mobility. Dentition appears healthy. Oropharynx: Tonsils are symmetric. There are no mucosal masses identified. Tongue base appears normal and healthy. Larynx/Hypopharynx: deferred Chest: Deferred Neck: Hard level for mass, approximately 3-1/2 cm.  No other adenopathy or masses palpable.. Neuro: Cranial nerves II-XII will normal function. Balance: Normal gate. Other findings: none.   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  Recommend exam under anesthesia with endoscopy and directed biopsies to try to identify a primary site.  I will talk to his cardiologist about stopping the Plavix.  He has seen the dentist this morning and is going to need tooth extraction.  If possible, we will try to coordinate into one procedure.

## 2015-09-15 ENCOUNTER — Encounter (HOSPITAL_COMMUNITY)
Admission: RE | Admit: 2015-09-15 | Discharge: 2015-09-15 | Disposition: A | Discharge status: Home / Self Care | Payer: Medicare Other | Source: Ambulatory Visit | Attending: Otolaryngology | Admitting: Otolaryngology

## 2015-09-15 ENCOUNTER — Telehealth: Payer: Self-pay | Admitting: *Deleted

## 2015-09-15 ENCOUNTER — Encounter (HOSPITAL_COMMUNITY): Payer: Self-pay

## 2015-09-15 DIAGNOSIS — Z7982 Long term (current) use of aspirin: Secondary | ICD-10-CM | POA: Insufficient documentation

## 2015-09-15 DIAGNOSIS — C770 Secondary and unspecified malignant neoplasm of lymph nodes of head, face and neck: Secondary | ICD-10-CM | POA: Insufficient documentation

## 2015-09-15 DIAGNOSIS — E785 Hyperlipidemia, unspecified: Secondary | ICD-10-CM | POA: Diagnosis not present

## 2015-09-15 DIAGNOSIS — Z8546 Personal history of malignant neoplasm of prostate: Secondary | ICD-10-CM | POA: Diagnosis not present

## 2015-09-15 DIAGNOSIS — I252 Old myocardial infarction: Secondary | ICD-10-CM | POA: Diagnosis not present

## 2015-09-15 DIAGNOSIS — I10 Essential (primary) hypertension: Secondary | ICD-10-CM | POA: Insufficient documentation

## 2015-09-15 DIAGNOSIS — Z01818 Encounter for other preprocedural examination: Secondary | ICD-10-CM | POA: Diagnosis present

## 2015-09-15 DIAGNOSIS — I2510 Atherosclerotic heart disease of native coronary artery without angina pectoris: Secondary | ICD-10-CM | POA: Insufficient documentation

## 2015-09-15 DIAGNOSIS — Z794 Long term (current) use of insulin: Secondary | ICD-10-CM | POA: Diagnosis not present

## 2015-09-15 DIAGNOSIS — Z955 Presence of coronary angioplasty implant and graft: Secondary | ICD-10-CM | POA: Diagnosis not present

## 2015-09-15 DIAGNOSIS — Z01812 Encounter for preprocedural laboratory examination: Secondary | ICD-10-CM | POA: Insufficient documentation

## 2015-09-15 DIAGNOSIS — Z79899 Other long term (current) drug therapy: Secondary | ICD-10-CM | POA: Insufficient documentation

## 2015-09-15 DIAGNOSIS — C801 Malignant (primary) neoplasm, unspecified: Secondary | ICD-10-CM | POA: Diagnosis not present

## 2015-09-15 DIAGNOSIS — E119 Type 2 diabetes mellitus without complications: Secondary | ICD-10-CM | POA: Diagnosis not present

## 2015-09-15 DIAGNOSIS — Z7902 Long term (current) use of antithrombotics/antiplatelets: Secondary | ICD-10-CM | POA: Insufficient documentation

## 2015-09-15 HISTORY — DX: Anemia, unspecified: D64.9

## 2015-09-15 HISTORY — DX: Pneumonia, unspecified organism: J18.9

## 2015-09-15 LAB — BASIC METABOLIC PANEL
ANION GAP: 10 (ref 5–15)
BUN: 14 mg/dL (ref 6–20)
CO2: 25 mmol/L (ref 22–32)
Calcium: 10.1 mg/dL (ref 8.9–10.3)
Chloride: 102 mmol/L (ref 101–111)
Creatinine, Ser: 0.88 mg/dL (ref 0.61–1.24)
GFR calc Af Amer: >60 mL/min (ref >60)
GFR calc non Af Amer: >60 mL/min (ref >60)
GLUCOSE: 230 mg/dL — AB (ref 65–99)
POTASSIUM: 3.7 mmol/L (ref 3.5–5.1)
Sodium: 137 mmol/L (ref 135–145)

## 2015-09-15 LAB — CBC
HEMATOCRIT: 46.5 % (ref 39.0–52.0)
Hemoglobin: 15.8 g/dL (ref 13.0–17.0)
MCH: 28.9 pg (ref 26.0–34.0)
MCHC: 34 g/dL (ref 30.0–36.0)
MCV: 85 fL (ref 78.0–100.0)
Platelets: 162 10*3/uL (ref 150–400)
RBC: 5.47 MIL/uL (ref 4.22–5.81)
RDW: 13 % (ref 11.5–15.5)
WBC: 11.1 10*3/uL — AB (ref 4.0–10.5)

## 2015-09-15 LAB — GLUCOSE, CAPILLARY: GLUCOSE-CAPILLARY: 249 mg/dL — AB (ref 65–99)

## 2015-09-15 NOTE — Pre-Procedure Instructions (Addendum)
Benjamin Barr  09/15/2015      [facility] [address], [place] [address] Phone: [phone] Fax: [phone]  [id number] [place], [place] - [address] [place] [address] Phone: [phone] Fax: [phone]  [id number] - [place], [place]. [address] [place] [address] Phone: [phone] Fax: [phone]    Your procedure is scheduled on 09/19/15  Report to [facility] Admitting at 1030 A.M.  Call this number if you have problems the morning of surgery:  [phone]   Remember:  Do not eat food or drink liquids after midnight.  Take these medicines the morning of surgery with A SIP OF WATER none  STOP all herbel meds, nsaids (aleve,naproxen,advil,ibuprofen) 7 days prior to surgery including vitamins  Stop plavix per dr (09/09/15)     Continue aspirin      How to Manage Your Diabetes Before and After Surgery  Why is it important to control my blood sugar before and after surgery? . Improving blood sugar levels before and after surgery helps healing and can limit problems. . A way of improving blood sugar control is eating a healthy diet by: o  Eating less sugar and carbohydrates o  Increasing activity/exercise o  Talking with your doctor about reaching your blood sugar goals . High blood sugars (greater than 180 mg/dL) can raise your risk of infections and slow your recovery, so you will need to focus on controlling your diabetes during the weeks before surgery. . Make sure that the doctor who takes care of your diabetes knows about your planned surgery including the date and location.  How do I manage my blood sugar before surgery? . Check your blood sugar at least 4 times a day, starting 2 days before surgery, to make sure that the  level is not too high or low. o Check your blood sugar the morning of your surgery when you wake up and every 2 hours until you get to the Short Stay unit. . If your blood sugar is less than 70 mg/dL, you will need to treat for low blood sugar: o Do not take insulin. o Treat a low blood sugar (less than 70 mg/dL) with  cup of clear juice (cranberry or apple), 4 glucose tablets, OR glucose gel. o Recheck blood sugar in 15 minutes after treatment (to make sure it is greater than 70 mg/dL). If your blood sugar is not greater than 70 mg/dL on recheck, call [phone] for further instructions. . Report your blood sugar to the short stay nurse when you get to Short Stay.  . If you are admitted to the hospital after surgery: o Your blood sugar will be checked by the staff and you will probably be given insulin after surgery (instead of oral diabetes medicines) to make sure you have good blood sugar levels. o The goal for blood sugar control after surgery is 80-180 mg/dL.   WHAT DO I DO ABOUT MY DIABETES MEDICATION?   [name] Do not take oral diabetes medicines (pills) the morning of surgery.  . THE NIGHT BEFORE SURGERY, take 7-14units of novolog insulin. lantus take 50 units       . HE MORNING OF SURGERY,no insulin am of surgery  . [name]  Do not wear jewelry, make-up or nail polish.  Do not wear lotions, powders, or perfumes.  You may wear deoderant.  Do not shave 48 hours prior to surgery.  Men may shave face and neck.  Do not bring valuables to the hospital.  [facility] is not responsible for any belongings or valuables.  Contacts, dentures or bridgework may not be worn into surgery.  Leave your suitcase in the car.  After surgery it may be brought to your room.  For patients admitted to the hospital, discharge time will be determined by your treatment team.  Patients discharged the day of surgery will not be allowed to drive home.   Name and phone number of your driver:   Special  instructions:   Special Instructions: [place] - Preparing for Surgery  Before surgery, you can play an important role.  Because skin is not sterile, your skin needs to be as free of germs as possible.  You can reduce the number of germs on you skin by washing with CHG (chlorahexidine gluconate) soap before surgery.  CHG is an antiseptic cleaner which kills germs and bonds with the skin to continue killing germs even after washing.  Please DO NOT use if you have an allergy to CHG or antibacterial soaps.  If your skin becomes reddened/irritated stop using the CHG and inform your nurse when you arrive at Short Stay.  Do not shave (including legs and underarms) for at least 48 hours prior to the first CHG shower.  You may shave your face.  Please follow these instructions carefully:   1.  Shower with CHG Soap the night before surgery and the morning of Surgery.  2.  If you choose to wash your hair, wash your hair first as usual with your normal shampoo.  3.  After you shampoo, rinse your hair and body thoroughly to remove the Shampoo.  4.  Use CHG as you would any other liquid soap.  You can apply chg directly  to the skin and wash gently with scrungie or a clean washcloth.  5.  Apply the CHG Soap to your body ONLY FROM THE NECK DOWN.  Do not use on open wounds or open sores.  Avoid contact with your eyes ears, mouth and genitals (private parts).  Wash genitals (private parts)       with your normal soap.  6.  Wash thoroughly, paying special attention to the area where your surgery will be performed.  7.  Thoroughly rinse your body with warm water from the neck down.  8.  DO NOT shower/wash with your normal soap after using and rinsing off the CHG Soap.  9.  Pat yourself dry with a clean towel.            10.  Wear clean pajamas.            11.  Place clean sheets on your bed the night of your first shower and do not sleep with pets.  Day of Surgery  Do not apply any lotions/deodorants the  morning of surgery.  Please wear clean clothes to the hospital/surgery center.  Please read over the  fact sheets that you were given.

## 2015-09-15 NOTE — Telephone Encounter (Signed)
  Oncology Nurse Navigator Documentation  Spoke with Mr. Granieri, confirmed his attendance at [facility] tomorrow morning, reminded him of 0800 arrival time and check-in procedure. He informed:  Has extractions this Wed, 8/9, with Dr. Benson Norway.  He understands he will need about 2 weeks to heal.  Having bx this Friday with Dr. Constance Holster.   Scheduling an appt with his dentist for further dental follow-up.  Has an 8/15 appt with urologist Dr. Gaynelle Arabian in follow-up to PET findings. He expressed concern about the length of time before he will likely start RT.  I provided assurance that the timeframe of pre-RT activities and likely start of RT later this month is not jeopardizing his prognosis.  [name], RN, BSN, [place] at [facility] [phone]   Navigator Location: [place] (09/15/15 0932) Navigator Encounter Type: Telephone (09/15/15 0932) Telephone: Jerilee Hoh Confirmation/Clarification (09/15/15 0932)             Barriers/Navigation Needs: Coordination of Care (09/15/15 0932)                          Time Spent with Patient: 30 (09/15/15 0932)

## 2015-09-16 ENCOUNTER — Ambulatory Visit: Payer: Medicare Other | Attending: Radiation Oncology

## 2015-09-16 ENCOUNTER — Ambulatory Visit
Admission: RE | Admit: 2015-09-16 | Discharge: 2015-09-16 | Disposition: A | Discharge status: Home / Self Care | Payer: Medicare Other | Source: Ambulatory Visit | Attending: Radiation Oncology | Admitting: Radiation Oncology

## 2015-09-16 ENCOUNTER — Ambulatory Visit: Payer: Medicare Other | Admitting: Physical Therapy

## 2015-09-16 ENCOUNTER — Encounter: Payer: Self-pay | Admitting: *Deleted

## 2015-09-16 ENCOUNTER — Ambulatory Visit: Payer: Medicare Other | Admitting: Nutrition

## 2015-09-16 DIAGNOSIS — R221 Localized swelling, mass and lump, neck: Secondary | ICD-10-CM

## 2015-09-16 DIAGNOSIS — R29898 Other symptoms and signs involving the musculoskeletal system: Secondary | ICD-10-CM | POA: Diagnosis present

## 2015-09-16 DIAGNOSIS — R1310 Dysphagia, unspecified: Secondary | ICD-10-CM | POA: Diagnosis not present

## 2015-09-16 LAB — HEMOGLOBIN A1C
Hgb A1c MFr Bld: 9.7 % — ABNORMAL HIGH (ref 4.8–5.6)
MEAN PLASMA GLUCOSE: 232 mg/dL

## 2015-09-16 NOTE — Therapy (Signed)
[facility] [address] [place], [place], [address] Phone: [phone]   Fax:  [phone]  Speech Language Pathology Evaluation  Patient Details  Name: Benjamin Barr MRN: [id number] Date of Birth: 1945/03/01 Referring Provider: Eppie Gibson MD  Encounter Date: 09/16/2015      End of Session - 09/16/15 1254    Visit Number 1   Number of Visits 3   Date for SLP Re-Evaluation 11/28/15   SLP Start Time 53   SLP Stop Time  1000   SLP Time Calculation (min) 40 min   Activity Tolerance Patient tolerated treatment well      Past Medical History:  Diagnosis Date  . Anemia   . CAD (coronary artery disease)    a. 2008 PCI: Taxus DES  to mRCA, prox & mid LCFX;  b. 10/2014 MV: extensive ischemia in the RCA territory and small area of anterior ischemia;  c. 11/2014 PCI: LM nl, LAD 30ost/prox, 80d, LCX 90ost (3.0x12 Promus Prem DES), OM2 30, RCA 38m (3.0x12 Promus Prem DES), 87m, RPDA 90 (PTCA), EF 55-65%.  . Cancer ([place])    Head & neck ca  . Diverticulosis of colon   . Heart murmur   . History of colon polyps    2014--  hyperplastic, adenoma, and benign polyps  . Hyperlipidemia   . Hypertension   . Myocardial infarction ([place])   . Nonproliferative diabetic retinopathy ([place])   . Obesity   . Peripheral neuropathy ([place])   . Pneumonia    hx  . Prostate cancer ([place])   . RBBB (right bundle branch block)   . Skin cancer   . Type 2 diabetes mellitus ([place])     Past Surgical History:  Procedure Laterality Date  . APPENDECTOMY    . CARDIAC CATHETERIZATION N/A 11/13/2014   Procedure: Left Heart Cath and Coronary Angiography;  Surgeon: [name] [name], MD;  Location: [facility];  Service: Cardiovascular;  Laterality: N/A;  . CARDIAC CATHETERIZATION  11/13/2014   Procedure: Coronary Stent Intervention;  Surgeon: [name] [name], MD;  Location: [facility];  Service: Cardiovascular;;  . CARDIOVASCULAR STRESS TEST   01-17-2012  dr [name]   Normal perfusion study/ no ischemia or infarction/  normal LVF and wall motion, ef 54%  . CORONARY ANGIOPLASTY WITH STENT PLACEMENT  05/04/2006   dr [name]   Severe 2-vessel obstructive CAD/  normal LVF/  PCI with DES to proxima and mid CFX and mRCA (total 3 DES)  . CRYOABLATION N/A 04/01/2014   Procedure: CRYO ABLATION PROSTATE;  Surgeon: [name], MD;  Location: [facility];  Service: Urology;  Laterality: N/A;  . INSERTION OF SUPRAPUBIC CATHETER N/A 04/01/2014   Procedure: INSERTION OF SUPRAPUBIC CATHETER;  Surgeon: [name], MD;  Location: [facility];  Service: Urology;  Laterality: N/A;  suprapubic   . PARTIAL COLECTOMY  80's    There were no vitals filed for this visit.      Subjective Assessment - 09/16/15 0930    Subjective Pt denies any current difficulty with swallowing   Currently in Pain? No/denies            SLP Evaluation [facility] - 09/16/15 0930      SLP Visit Information   SLP Received On 09/16/15   Referring Provider Eppie Gibson MD     Subjective   Subjective Pt is 36 y,o male with rt neck mass and unknown primary.  Cognition   Overall Cognitive Status Within Functional Limits for tasks assessed     Auditory Comprehension   Overall Auditory Comprehension Appears within functional limits for tasks assessed     Verbal Expression   Overall Verbal Expression Appears within functional limits for tasks assessed     Oral Motor/Sensory Function   Overall Oral Motor/Sensory Function Appears within functional limits for tasks assessed     Motor Speech   Overall Motor Speech Appears within functional limits for tasks assessed       Pt currently tolerates regular diet and thin liquids. Oral motor assessment revealed - as above.  POs: Pt ate ham sandwich and drank H2O without overt s/s aspiration. Thyroid elevation appeared adequate, and swallows appeared timely. Oral residue noted  as WNL. Pt's swallow deemed essentially WNL at this time.   Because data states the risk for dysphagia during and after radiation treatment is high due to undergoing radiation tx, SLP taught pt about the possibility of reduced/limited ability for PO intake during rad tx. SLP encouraged pt to continue swallowing POs as far into rad tx as possible, even ingesting POs and/or completing HEP shortly after administration of pain meds.   SLP educated pt re: changes to swallowing musculature after rad tx, and why adherence to dysphagia HEP provided today and PO consumption was necessary to inhibit muscular disuse atrophy and to reduce muscle fibrosis following rad tx. Pt demonstrated understanding of these things to SLP.    SLP then developed a HEP for pt and pt was instructed how to perform exercises involving lingual, vocal, and pharyngeal strengthening. SLP performed each exercise and pt return demonstrated each exercise. SLP ensured pt performance was correct prior to moving on to next exercise. Pt was instructed to complete this program 2-3 times a day, until 6 months after their last rad tx, then x2-3 a week after that.                    SLP Education - 09/16/15 1254    Education provided Yes   Education Details HEP, late effects head/neck radiation on swallowing skills   Person(s) Educated Patient;Spouse   Methods Explanation;Demonstration;Handout   Comprehension Verbalized understanding;Returned demonstration;Verbal cues required;Need further instruction          SLP Short Term Goals - 09/16/15 1257      SLP SHORT TERM GOAL #1   Title pt will perform HEP with rare min A    Time 2   Period --  visits   Status New     SLP SHORT TERM GOAL #2   Title pt will tell SLP why he is completing HEP   Time 2   Period --  visits   Status New          SLP Long Term Goals - 09/16/15 1257      SLP LONG TERM GOAL #1   Title pt will complete HEP with inependence   Time 3    Period --  vistis   Status New     SLP LONG TERM GOAL #2   Title pt will tell SLP 3 s/s aspiration PNA with modified independence   Time 3   Period --  visits   Status New          Plan - 09/16/15 1255    Clinical Impression Statement Pt presents today with swallowing essentially WNL, however data states that the risk for dysphagia and aspiration increases as pt progresses through radiation  therapy, and afterwards. Therefore, skilled ST is necessary to assess pt's cont'd procedure with HEP as well as to assess pt's safety with POs.   Speech Therapy Frequency --  once approx every four weeks   Duration --  3 visits   Treatment/Interventions Aspiration precaution training;Pharyngeal strengthening exercises;Diet toleration management by SLP;Compensatory techniques;Trials of upgraded texture/liquids;Cueing hierarchy;Patient/family education;Oral motor exercises;SLP instruction and feedback   Potential to [place] provided today   Consulted and Agree with Plan of Care Patient      Patient will benefit from skilled therapeutic intervention in order to improve the following deficits and impairments:   Dysphagia      G-Codes - 10-16-15 1258    Functional Assessment Tool Used noms - 7   Functional Limitations Swallowing   Swallow Current Status [id number]) 0 percent impaired, limited or restricted   Swallow Goal Status [id number]) At least 1 percent but less than 20 percent impaired, limited or restricted      Problem List Patient Active Problem List   Diagnosis Date Noted  . History of prostate cancer 09/03/2015  . Squamous cell skin cancer 09/03/2015  . Cancer of head, face, or neck lymph nodes, secondary ([place]) 09/03/2015  . Unstable angina ([place]) 11/14/2014  . Abnormal nuclear stress test 11/13/2014  . Angina pectoris ([place]) 11/13/2014  . PMR (polymyalgia rheumatica) (HCC) 11/02/2014  . Chest pain with moderate risk for cardiac etiology 10/31/2014   . Diabetes mellitus with diabetic neuropathy, with long-term current use of insulin ([place]) 12/28/2010  . CAD S/P percutaneous coronary angioplasty   . Hypertension   . Hyperlipidemia   . Obesity     SCHINKE,CARL ,MS, CCC-SLP  2015/10/16, 12:59 PM  [facility] [address] [place], [place], [address] Phone: [phone]   Fax:  [phone]  Name: Benjamin Barr MRN: [id number] Date of Birth: 1945-03-16

## 2015-09-16 NOTE — Patient Instructions (Signed)
SWALLOWING EXERCISES Do these 6 of the 7 days per week until 6 months after your last day of radiation, then 2-3 times per week afterwards  1. Effortful Swallows - Press your tongue against the roof of your mouth for 3 seconds, then squeeze          the muscles in your neck while you swallow your saliva or a sip of water - Repeat 15-20 times, 2-3 times a day, and use whenever you eat or drink  2. Masako Swallow - swallow with your tongue sticking out - Stick tongue out past your teeth and gently bite tongue with your teeth - Swallow, while holding your tongue with your teeth - Repeat 15-20 times, 2-3 times a day *use a wet spoon if your mouth gets dry*  3. Shaker Exercise - head lift - Lie flat on your back in your bed or on a couch without pillows - Raise your head and look at your feet - KEEP YOUR SHOULDERS DOWN - HOLD FOR 45-60 SECONDS, then lower your head back down - Repeat 3 times, 2-3 times a day  4. Mendelsohn Maneuver - "half swallow" exercise - Start to swallow, and keep your Adam's apple up by squeezing hard with the            muscles of the throat - Hold the squeeze for 5-7 seconds and then relax - Repeat 15-20 times, 2-3 times a day *use a wet spoon if your mouth gets dry*  5. Breath Hold - Say "HUH!" loudly, then hold your breath for 3 seconds at your voice box - Repeat 20 times, 2-3 times a day  6. Chin pushback - Open your mouth  - Place your fist UNDER your chin near your neck, and push back with your fist for 5 seconds - Repeat 10 times, 2-3 times a day

## 2015-09-16 NOTE — Therapy (Signed)
[place], [place], [address] Phone: [phone]   Fax:  [phone]  Physical Therapy Evaluation  Patient Details  Name: Benjamin Barr MRN: [id number] Date of Birth: 1946/02/02 Referring Provider: Dr. Heath Lark  Encounter Date: 09/16/2015      PT End of Session - 09/16/15 0940    Visit Number 1   Number of Visits 1   PT Start Time 0835   PT Stop Time 0910   PT Time Calculation (min) 35 min   Activity Tolerance Patient tolerated treatment well   Behavior During Therapy [facility] for tasks assessed/performed      Past Medical History:  Diagnosis Date  . Anemia   . CAD (coronary artery disease)    a. 2008 PCI: Taxus DES  to mRCA, prox & mid LCFX;  b. 10/2014 MV: extensive ischemia in the RCA territory and small area of anterior ischemia;  c. 11/2014 PCI: LM nl, LAD 30ost/prox, 80d, LCX 90ost (3.0x12 Promus Prem DES), OM2 30, RCA 37m (3.0x12 Promus Prem DES), 7m, RPDA 90 (PTCA), EF 55-65%.  . Cancer ([place])    Head & neck ca  . Diverticulosis of colon   . Heart murmur   . History of colon polyps    2014--  hyperplastic, adenoma, and benign polyps  . Hyperlipidemia   . Hypertension   . Myocardial infarction ([place])   . Nonproliferative diabetic retinopathy ([place])   . Obesity   . Peripheral neuropathy ([place])   . Pneumonia    hx  . Prostate cancer ([place])   . RBBB (right bundle branch block)   . Skin cancer   . Type 2 diabetes mellitus ([place])     Past Surgical History:  Procedure Laterality Date  . APPENDECTOMY    . CARDIAC CATHETERIZATION N/A 11/13/2014   Procedure: Left Heart Cath and Coronary Angiography;  Surgeon: [name] [name], MD;  Location: [facility];  Service: Cardiovascular;  Laterality: N/A;  . CARDIAC CATHETERIZATION  11/13/2014   Procedure: Coronary Stent Intervention;  Surgeon: [name] [name], MD;  Location: [facility];  Service: Cardiovascular;;  . CARDIOVASCULAR STRESS  TEST  01-17-2012  dr [name]   Normal perfusion study/ no ischemia or infarction/  normal LVF and wall motion, ef 54%  . CORONARY ANGIOPLASTY WITH STENT PLACEMENT  05/04/2006   dr [name]   Severe 2-vessel obstructive CAD/  normal LVF/  PCI with DES to proxima and mid CFX and mRCA (total 3 DES)  . CRYOABLATION N/A 04/01/2014   Procedure: CRYO ABLATION PROSTATE;  Surgeon: [name], MD;  Location: [facility];  Service: Urology;  Laterality: N/A;  . INSERTION OF SUPRAPUBIC CATHETER N/A 04/01/2014   Procedure: INSERTION OF SUPRAPUBIC CATHETER;  Surgeon: [name], MD;  Location: [facility];  Service: Urology;  Laterality: N/A;  suprapubic   . PARTIAL COLECTOMY  80's    There were no vitals filed for this visit.       Subjective Assessment - 09/16/15 0917    Subjective Describes multiple serious medical conditions over the past year and how his head & neck cancer was noticed (lump at right side of neck).   Patient is accompained by: Family member  wife   Pertinent History Prostate cancer treated with cryotherapy about year ago; IDDM, polymyalgia rhematica; CAD s/p angioplasty (2 stents placed October 2016).  Bloody nasal congestion treated with antibiotics; developed a cough and was diagnosed with pneumonia;  then had mass noted at right neck which was biopsied and found to be squamous cell carcinoma with unknown primary.  Reports some orthostatic hypotension, especially early in the day.  h/o foot and leg swelling that he says has mostly resolved and occurred at the time of his polymyalgia rhematica.   Patient Stated Goals get info from all head & neck providers   Currently in Pain? 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 Heath Lark     Precautions   Precautions Other (comment)   Precaution Comments cancer precautions     Restrictions   Weight Bearing Restrictions No     Balance Screen   Has the patient fallen in the past 6 months No   Has the patient had a decrease in activity level because of a fear of falling?  No   Is the patient reluctant to leave their home because of a fear of falling?  No     Home Environment   Living Environment Private residence   Type of [place] One level     Prior Function   Level of Independence Independent   Leisure no exercise currently; used to walk the dog about 2 miles (in one hour) and play golf, but hasn't done recently due to recent medical history; has done cardiac rehab in the past year     Cognition   Overall Cognitive Status Within Functional Limits for tasks assessed     Observation/Other Assessments   Observations well looking gentleman accompanied by his wife     Functional Tests   Functional tests Sit to Stand     Sit to Stand   Comments 13 times in 30 seconds, slightly above average for his age     Posture/Postural Control   Posture/Postural Control Postural limitations   Postural Limitations Forward head;Rounded Shoulders     ROM / Strength   AROM / PROM / Strength AROM     AROM   Overall AROM Comments bilateral shoulder AROM slightly limited, but functional  c/o right upper arm soreness with motion   AROM Assessment Site Cervical   Cervical Flexion full   Cervical Extension 50% loss   Cervical - Right Side Bend 50% loss   Cervical - Left Side Bend 50% loss   Cervical - Right Rotation 50% loss   Cervical - Left Rotation 25% loss     Palpation   Palpation comment firm mass at right neck the size  of a large marble     Ambulation/Gait   Ambulation/Gait Yes   Ambulation/Gait Assistance 7: Independent   Gait Comments Feels too weak to walk the distance he used to (2 miles with the dog)           LYMPHEDEMA/ONCOLOGY QUESTIONNAIRE - 09/16/15 0937      Type   Cancer Type head & neck squamous cell of unknown primary, in lymph node  Lymphedema Assessments   Lymphedema Assessments Head and Neck     Head and Neck   4 cm superior to sternal notch around neck 42 cm  tape measure over right neck mass   6 cm superior to sternal notch around neck 43.5 cm   8 cm superior to sternal notch around neck 45.3 cm                        PT Education - 09/16/15 0939    Education provided Yes   Education Details neck ROM, posture, walking, lymphedema info, PT info   Person(s) Educated Patient;Spouse   Methods Explanation;Handout   Comprehension Verbalized understanding                 Head and Neck Clinic Goals - 09/16/15 1029      Patient will be able to verbalize understanding of a home exercise program for cervical range of motion, posture, and walking.    Status Achieved     Patient will be able to verbalize understanding of proper sitting and standing posture.    Status Achieved     Patient will be able to verbalize understanding of lymphedema risk and availability of treatment for this condition.    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  Expects to have radiation treatment for that.  He currently has neck AROM limitations, probably due to the neck mass; posture is forward head; he reports weakness limiting his ability to walk the  dog, though he performed above average on 30-second sit to stand test.   Rehab Potential Good   PT Frequency One time visit   PT Treatment/Interventions Patient/family education   PT Next Visit Plan no follow-up planned at this time, but patient may need therapy if lymphedema or deconditioning develops with treatment   PT Home Exercise Plan neck ROM, walking, posture   Consulted and Agree with Plan of Care Patient      Patient will benefit from skilled therapeutic intervention in order to improve the following deficits and impairments:  Decreased range of motion, Decreased activity tolerance, Pain  Visit Diagnosis: Localized swelling, mass and lump, neck - Plan: PT plan of care cert/re-cert  Other symptoms and signs involving the musculoskeletal system - Plan: PT plan of care cert/re-cert      G-Codes - [id number] 1030    Functional Limitation Changing and maintaining body position   Changing and Maintaining Body Position Current Status [id number]) At least 1 percent but less than 20 percent impaired, limited or restricted   Changing and Maintaining Body Position Goal Status [id number]) At least 1 percent but less than 20 percent impaired, limited or restricted   Changing and Maintaining Body Position Discharge Status [id number]) At least 1 percent but less than 20 percent impaired, limited or restricted       Problem List Patient Active Problem List   Diagnosis Date Noted  . History of prostate cancer 09/03/2015  . Squamous cell skin cancer 09/03/2015  . Cancer of head, face, or neck lymph nodes, secondary ([place]) 09/03/2015  . Unstable angina ([place]) 11/14/2014  . Abnormal nuclear stress test 11/13/2014  . Angina pectoris (  [place]) 11/13/2014  . PMR (polymyalgia rheumatica) ([facility]) 11/02/2014  . Chest pain with moderate risk for cardiac etiology 10/31/2014  . Diabetes mellitus with diabetic neuropathy, with long-term current use of insulin ([place]) 12/28/2010  . CAD S/P percutaneous coronary  angioplasty   . Hypertension   . Hyperlipidemia   . Obesity     Ad Guttman 09/16/2015, 10:35 AM  [facility], [place], [address] Phone: [phone]   Fax:  [phone]  Name: ZEKHI NOGGLE MRN: [id number] Date of Birth: 1945/12/28  Serafina Royals, PT 09/16/15 10:35 AM

## 2015-09-16 NOTE — Progress Notes (Signed)
Patient was seen during Head and Neck clinic.  70 year old male diagnosed with right neck squamous cell cancer with unknown primary. Past medical history includes prostate cancer, skin cancer, diabetes, angina, CAD, hypertension, hyperlipidemia, obesity, diverticulosis.  Medications include vitamin C, iron, NovoLog, Lantus and Zocor.  Labs include glucose 230 and hemoglobin A1c 9.7.  Height: 68 inches. Weight: 201.5 pounds. Usual body weight: 200 pounds. BMI: 30.64.  Estimated nutrition needs: 2000-2200 cal, 110-125 grams protein, greater than 2.2 L fluid.  Patient presents with no nutrition impact symptoms. Reports he follows a no concentrated sweets diet.  However, does consume sweets on occasion. Reports close interaction with his endocrinologist to adjust his insulin.  Nutrition diagnosis:  Predicted suboptimal energy intake related to new head and neck cancer as evidenced by history or presence of this condition for which research shows an increased incidence of suboptimal energy intake.  Intervention: Educated patient to consume high-protein foods with adequate calories to minimize weight loss. Encouraged 3 meals with 3 snacks daily. Suggested patient may try oral nutrition supplements such as Glucerna or boost glucose control and provided coupons. Provided fact sheets on increasing calories and protein, soft, moist protein foods, and recipes for smoothies. Questions were answered.  Teach back method used.  Contact information was given.  Monitoring, evaluation, goals:  Patient will tolerate adequate calories and protein to minimize weight loss and have adequate glycemic control.  Next visit: To be scheduled weekly with treatments.  **Disclaimer: This note was dictated with voice recognition software. Similar sounding words can inadvertently be transcribed and this note may contain transcription errors which may not have been corrected upon publication of note.**

## 2015-09-16 NOTE — Progress Notes (Signed)
Anesthesia Chart Review:  Pt is a 70 year old male scheduled for direct laryngoscopy and nasopharyngoscopy, biopsies of pharynx, nasopharynx and possible tonsillectomy, esophagoscopy on 09/19/2015 with Izora Gala, MD.   Cardiologist is [name], MD who is aware of upcoming surgery and gave ok to stop plavix 1 week before surgery.   PCP is Aura Dials, MD.   PMH includes:  CAD (DES to RCA, prox & mid CX 2008; DES to CX and RCA 10/2014), MI, heart murmur, HTN, hyperlipidemia, DM, RBBB, anemia, prostate cancer, head and neck cancer. Never smoker. BMI 31  Medications include: ASA, plavix, iron, hctz, lantus, simvastatin. Last plavix 09/09/15. Pt to remain on ASA.   Preoperative labs reviewed.  Glucose 230, hgbA1c 9.7.   Chest x-ray 05/22/15 reviewed. Interim near complete clearing of right base infiltrate.  EKG 11/21/14: NSR. RBBB. LAD. Cannot rule out inferior infarct, age undetermined.   Cardiac cath 11/13/14:   Ost LAD to Prox LAD lesion, 30% stenosed.  Dist LAD lesion, 80% stenosed.  2nd Mrg lesion, 30% stenosed.  Ost Cx lesion, 90% stenosed.  The left ventricular systolic function is normal.  Ost Cx to Prox Cx lesion, 90% stenosed. This is proximal the the prior stent. There is a 0% residual stenosis post intervention.  RPDA lesion, 90% stenosed. There is a 10% residual stenosis post intervention.  Mid RCA-1 lesion, 90% stenosed. There is a 0% residual stenosis post intervention. A drug-eluting stent was placed. This is proximal to the prior stent.  Mid RCA-1 lesion, 90% stenosed.  A drug-eluting stent was placed.  A drug-eluting stent was placed.   1. Severe 2 vessel obstructive CAD     90% ostial LCx. Prior stent in the proximal to mid LCx is patent.     90% mid RCA proximal to prior stent. The old stent is patent     90% PDA 2. Normal LV function 3. Successful stenting of the ostial LCx with a DES 4. Successful stenting of the mid RCA with a DES  5. Successful  POBA of the PDA.  Plan: continue DAPT indefinitely. Anticipate DC in am.   Echo 05/22/14:  - Left ventricle: The cavity size was normal. There was mild concentric hypertrophy. Systolic function was normal. The estimated ejection fraction was in the range of 60% to 65%. Wall motion was normal; there were no regional wall motionabnormalities. There was an increased relative contribution ofatrial contraction to ventricular filling. Doppler parameters areconsistent with abnormal left ventricular relaxation (grade 1diastolic dysfunction). - Aortic valve: Moderate diffuse thickening and calcification, consistent with sclerosis. - Mitral valve: Calcified annulus. Moderate focal calcification of the anterior leaflet. There was mild regurgitation. - Pericardium, extracardiac: A trivial pericardial effusion was identified posterior to the heart.  If no changes, I anticipate pt can proceed with surgery as scheduled.   Willeen Cass, FNP-BC [facility] Short Stay Surgical Center/Anesthesiology Phone: [phone] 09/16/2015 3:51 PM

## 2015-09-17 HISTORY — PX: MULTIPLE TOOTH EXTRACTIONS: SHX2053

## 2015-09-17 NOTE — Progress Notes (Signed)
Head & Neck Multidisciplinary Clinic Clinical Social Work  Clinical Social Work met with patient/family at head & neck multidisciplinary clinic to offer support and assess for psychosocial needs.  Mr. Thrall shared with CSW his multiple health concerns over the past year and discussed how he has coped with the changes in his life.  CSW, patient, and spouse discussed positive coping strategies when dealing with anxiety and "the unknown".    Clinical Social Work briefly discussed Clinical Social Work role and [employer] support programs/services.    Clinical Social Work assistd patient in completing healthcare advance directives after today's clinic visit. The patient designated spouse, Hilda Blades, as their primary healthcare agent and Lannie Yusuf as their secondary agent.  Patient also completed healthcare living will.  Patient indicated in living will that he does not desire life prolonging measures in the end of life scenarios indicated.  Clinical Social Worker notarized documents and made copies for patient/family. Clinical Social Worker will send documents to medical records to be scanned into patient's chart. Clinical Social Worker encouraged patient/family to contact with any additional questions or concerns.  Polo Riley, MSW, LCSW Clinical Social Worker [facility] [phone]          Polo Riley, MSW, LCSW, [facility] Clinical Social Worker [place] [phone]

## 2015-09-19 ENCOUNTER — Ambulatory Visit (HOSPITAL_COMMUNITY): Payer: Medicare Other | Admitting: Anesthesiology

## 2015-09-19 ENCOUNTER — Ambulatory Visit (HOSPITAL_COMMUNITY): Payer: Medicare Other | Admitting: Emergency Medicine

## 2015-09-19 ENCOUNTER — Ambulatory Visit (HOSPITAL_COMMUNITY)
Admission: RE | Admit: 2015-09-19 | Discharge: 2015-09-19 | Disposition: A | Discharge status: Home / Self Care | Payer: Medicare Other | Source: Ambulatory Visit | Attending: Otolaryngology | Admitting: Otolaryngology

## 2015-09-19 ENCOUNTER — Encounter (HOSPITAL_COMMUNITY)
Admission: RE | Disposition: A | Discharge status: Home / Self Care | Payer: Self-pay | Source: Ambulatory Visit | Attending: Otolaryngology

## 2015-09-19 ENCOUNTER — Encounter (HOSPITAL_COMMUNITY): Payer: Self-pay | Admitting: *Deleted

## 2015-09-19 DIAGNOSIS — Z7902 Long term (current) use of antithrombotics/antiplatelets: Secondary | ICD-10-CM | POA: Diagnosis not present

## 2015-09-19 DIAGNOSIS — Z7982 Long term (current) use of aspirin: Secondary | ICD-10-CM | POA: Diagnosis not present

## 2015-09-19 DIAGNOSIS — I2510 Atherosclerotic heart disease of native coronary artery without angina pectoris: Secondary | ICD-10-CM | POA: Insufficient documentation

## 2015-09-19 DIAGNOSIS — Z8546 Personal history of malignant neoplasm of prostate: Secondary | ICD-10-CM | POA: Diagnosis not present

## 2015-09-19 DIAGNOSIS — E1142 Type 2 diabetes mellitus with diabetic polyneuropathy: Secondary | ICD-10-CM | POA: Diagnosis not present

## 2015-09-19 DIAGNOSIS — E785 Hyperlipidemia, unspecified: Secondary | ICD-10-CM | POA: Insufficient documentation

## 2015-09-19 DIAGNOSIS — E669 Obesity, unspecified: Secondary | ICD-10-CM | POA: Diagnosis not present

## 2015-09-19 DIAGNOSIS — C01 Malignant neoplasm of base of tongue: Secondary | ICD-10-CM | POA: Diagnosis not present

## 2015-09-19 DIAGNOSIS — Z794 Long term (current) use of insulin: Secondary | ICD-10-CM | POA: Insufficient documentation

## 2015-09-19 DIAGNOSIS — Z6830 Body mass index (BMI) 30.0-30.9, adult: Secondary | ICD-10-CM | POA: Diagnosis not present

## 2015-09-19 DIAGNOSIS — C7989 Secondary malignant neoplasm of other specified sites: Secondary | ICD-10-CM | POA: Diagnosis not present

## 2015-09-19 DIAGNOSIS — I10 Essential (primary) hypertension: Secondary | ICD-10-CM | POA: Diagnosis not present

## 2015-09-19 DIAGNOSIS — I252 Old myocardial infarction: Secondary | ICD-10-CM | POA: Diagnosis not present

## 2015-09-19 DIAGNOSIS — D0007 Carcinoma in situ of tongue: Secondary | ICD-10-CM | POA: Diagnosis present

## 2015-09-19 HISTORY — PX: FLOOR OF MOUTH BIOPSY: SHX5834

## 2015-09-19 HISTORY — PX: ESOPHAGOSCOPY: SHX5534

## 2015-09-19 HISTORY — PX: DIRECT LARYNGOSCOPY: SHX5326

## 2015-09-19 LAB — GLUCOSE, CAPILLARY
GLUCOSE-CAPILLARY: 177 mg/dL — AB (ref 65–99)
Glucose-Capillary: 253 mg/dL — ABNORMAL HIGH (ref 65–99)

## 2015-09-19 SURGERY — LARYNGOSCOPY, DIRECT
Anesthesia: General | Site: Throat

## 2015-09-19 MED ORDER — SUGAMMADEX SODIUM 200 MG/2ML IV SOLN
INTRAVENOUS | Status: DC | PRN
  Administered 2015-09-19: 200 mg via INTRAVENOUS

## 2015-09-19 MED ORDER — ESMOLOL HCL 100 MG/10ML IV SOLN
INTRAVENOUS | Status: AC
  Filled 2015-09-19: qty 10

## 2015-09-19 MED ORDER — PHENYLEPHRINE 40 MCG/ML (10ML) SYRINGE FOR IV PUSH (FOR BLOOD PRESSURE SUPPORT)
PREFILLED_SYRINGE | INTRAVENOUS | Status: AC
  Filled 2015-09-19: qty 10

## 2015-09-19 MED ORDER — FENTANYL CITRATE (PF) 100 MCG/2ML IJ SOLN
INTRAMUSCULAR | Status: DC | PRN
  Administered 2015-09-19 (×5): 50 ug via INTRAVENOUS

## 2015-09-19 MED ORDER — PHENYLEPHRINE HCL 10 MG/ML IJ SOLN
INTRAMUSCULAR | Status: DC | PRN
  Administered 2015-09-19: 80 ug via INTRAVENOUS
  Administered 2015-09-19: 40 ug via INTRAVENOUS
  Administered 2015-09-19: 80 ug via INTRAVENOUS
  Administered 2015-09-19: 120 ug via INTRAVENOUS
  Administered 2015-09-19: 40 ug via INTRAVENOUS

## 2015-09-19 MED ORDER — MIDAZOLAM HCL 5 MG/5ML IJ SOLN
INTRAMUSCULAR | Status: DC | PRN
  Administered 2015-09-19: 1 mg via INTRAVENOUS

## 2015-09-19 MED ORDER — EPINEPHRINE HCL (NASAL) 0.1 % NA SOLN
NASAL | Status: AC
  Filled 2015-09-19: qty 30

## 2015-09-19 MED ORDER — LIDOCAINE HCL (CARDIAC) 20 MG/ML IV SOLN
INTRAVENOUS | Status: DC | PRN
  Administered 2015-09-19: 100 mg via INTRAVENOUS

## 2015-09-19 MED ORDER — LACTATED RINGERS IV SOLN
INTRAVENOUS | Status: DC
  Administered 2015-09-19 (×2): via INTRAVENOUS

## 2015-09-19 MED ORDER — LIDOCAINE-EPINEPHRINE 1 %-1:100000 IJ SOLN
INTRAMUSCULAR | Status: AC
  Filled 2015-09-19: qty 1

## 2015-09-19 MED ORDER — ARTIFICIAL TEARS OP OINT
TOPICAL_OINTMENT | OPHTHALMIC | Status: AC
  Filled 2015-09-19: qty 3.5

## 2015-09-19 MED ORDER — ROCURONIUM BROMIDE 10 MG/ML (PF) SYRINGE
PREFILLED_SYRINGE | INTRAVENOUS | Status: AC
  Filled 2015-09-19: qty 10

## 2015-09-19 MED ORDER — ONDANSETRON HCL 4 MG/2ML IJ SOLN
INTRAMUSCULAR | Status: AC
  Filled 2015-09-19: qty 2

## 2015-09-19 MED ORDER — PHENYLEPHRINE HCL 10 MG/ML IJ SOLN
INTRAMUSCULAR | Status: DC | PRN
  Administered 2015-09-19: 30 ug/min via INTRAVENOUS

## 2015-09-19 MED ORDER — PROPOFOL 10 MG/ML IV BOLUS
INTRAVENOUS | Status: AC
  Filled 2015-09-19: qty 20

## 2015-09-19 MED ORDER — ONDANSETRON HCL 4 MG/2ML IJ SOLN
INTRAMUSCULAR | Status: DC | PRN
  Administered 2015-09-19: 4 mg via INTRAVENOUS

## 2015-09-19 MED ORDER — 0.9 % SODIUM CHLORIDE (POUR BTL) OPTIME
TOPICAL | Status: DC | PRN
  Administered 2015-09-19: 1000 mL

## 2015-09-19 MED ORDER — MEPERIDINE HCL 25 MG/ML IJ SOLN: 6.2500 mg | INTRAMUSCULAR | Status: DC | PRN

## 2015-09-19 MED ORDER — LIDOCAINE 2% (20 MG/ML) 5 ML SYRINGE
INTRAMUSCULAR | Status: AC
  Filled 2015-09-19: qty 5

## 2015-09-19 MED ORDER — MIDAZOLAM HCL 2 MG/2ML IJ SOLN
INTRAMUSCULAR | Status: AC
  Filled 2015-09-19: qty 2

## 2015-09-19 MED ORDER — FENTANYL CITRATE (PF) 250 MCG/5ML IJ SOLN
INTRAMUSCULAR | Status: AC
  Filled 2015-09-19: qty 5

## 2015-09-19 MED ORDER — PROPOFOL 10 MG/ML IV BOLUS
INTRAVENOUS | Status: DC | PRN
  Administered 2015-09-19: 150 mg via INTRAVENOUS

## 2015-09-19 MED ORDER — PROMETHAZINE HCL 25 MG/ML IJ SOLN: 6.2500 mg | INTRAMUSCULAR | Status: DC | PRN

## 2015-09-19 MED ORDER — ESMOLOL HCL 100 MG/10ML IV SOLN
INTRAVENOUS | Status: DC | PRN
  Administered 2015-09-19: 20 mg via INTRAVENOUS
  Administered 2015-09-19: 30 mg via INTRAVENOUS

## 2015-09-19 MED ORDER — SUGAMMADEX SODIUM 200 MG/2ML IV SOLN
INTRAVENOUS | Status: AC
  Filled 2015-09-19: qty 2

## 2015-09-19 MED ORDER — ROCURONIUM BROMIDE 100 MG/10ML IV SOLN
INTRAVENOUS | Status: DC | PRN
  Administered 2015-09-19: 40 mg via INTRAVENOUS

## 2015-09-19 SURGICAL SUPPLY — 30 items
BALLN PULM 15 16.5 18 X 75CM (BALLOONS)
BALLN PULM 15 16.5 18X75 (BALLOONS)
BALLOON PULM 15 16.5 18X75 (BALLOONS) IMPLANT
BLADE SURG 15 STRL LF DISP TIS (BLADE) IMPLANT
BLADE SURG 15 STRL SS (BLADE)
CANISTER SUCTION 2500CC (MISCELLANEOUS) ×4 IMPLANT
CONT SPEC 4OZ CLIKSEAL STRL BL (MISCELLANEOUS) ×4 IMPLANT
COVER MAYO STAND STRL (DRAPES) ×4 IMPLANT
COVER TABLE BACK 60X90 (DRAPES) ×4 IMPLANT
DRAPE PROXIMA HALF (DRAPES) ×4 IMPLANT
GAUZE SPONGE 4X4 12PLY STRL (GAUZE/BANDAGES/DRESSINGS) ×4 IMPLANT
GAUZE SPONGE 4X4 16PLY XRAY LF (GAUZE/BANDAGES/DRESSINGS) ×4 IMPLANT
GLOVE ECLIPSE 7.5 STRL STRAW (GLOVE) ×4 IMPLANT
GLOVE SURG SS PI 6.5 STRL IVOR (GLOVE) ×2 IMPLANT
GUARD TEETH (MISCELLANEOUS) ×3 IMPLANT
KIT BASIN OR (CUSTOM PROCEDURE TRAY) ×4 IMPLANT
KIT ROOM TURNOVER OR (KITS) ×4 IMPLANT
MARKER SKIN DUAL TIP RULER LAB (MISCELLANEOUS) IMPLANT
NEEDLE 27GAX1X1/2 (NEEDLE) IMPLANT
NS IRRIG 1000ML POUR BTL (IV SOLUTION) ×4 IMPLANT
PAD ARMBOARD 7.5X6 YLW CONV (MISCELLANEOUS) ×8 IMPLANT
PATTIES SURGICAL .5 X3 (DISPOSABLE) ×4 IMPLANT
SOLUTION ANTI FOG 6CC (MISCELLANEOUS) ×3 IMPLANT
SPECIMEN JAR SMALL (MISCELLANEOUS) IMPLANT
SYR CONTROL 10ML LL (SYRINGE) IMPLANT
SYR TB 1ML LUER SLIP (SYRINGE) IMPLANT
TOWEL OR 17X24 6PK STRL BLUE (TOWEL DISPOSABLE) ×4 IMPLANT
TOWEL OR 17X26 10 PK STRL BLUE (TOWEL DISPOSABLE) ×4 IMPLANT
TUBE CONNECTING 12'X1/4 (SUCTIONS) ×1
TUBE CONNECTING 12X1/4 (SUCTIONS) ×3 IMPLANT

## 2015-09-19 NOTE — H&P (View-Only) (Signed)
Progress Notes  Expand All Collapse All   Otolaryngology Office Note  HPI:   Benjamin Barr is a 70 y.o. male who presents as a consult  Patient.   Referring Provider: Acquanetta Belling  Chief complaint: Neck mass.  HPI: He noticed a small hard mass in the right lower neck about 2-1/2 weeks ago.  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  CT scan and PET scan were also performed.  No evidence of a primary site.  He has been to the medical and radiation oncologist.  Referred here for evaluation to try to identify a primary site.  He does not smoke or drink.  He has no other symptoms related to this.  He has a history of heart disease for which he currently takes Plavix.  PMH/Meds/All/SocHx/FamHx/ROS:    Past Medical History       Past Medical History:  Diagnosis Date  . Blockage of coronary artery of heart ([place])   . Diabetes mellitus ([place])   . Prostate cancer [facility])        Past Surgical History        Past Surgical History:  Procedure Laterality Date  . COLONOSCOPY    . MOUTH SURGERY        No family history of bleeding disorders, wound healing problems or difficulty with anesthesia.    Social History   Social History        Social History  . Marital status: Married    Spouse name: N/A  . Number of children: N/A  . Years of education: N/A      Occupational History  . Not on file.       Social History Main Topics  . Smoking status: Never Smoker  . Smokeless tobacco: Not on file  . Alcohol use Not on file  . Drug use: Not on file  . Sexual activity: Not on file       Other Topics Concern  . Not on file      Social History Narrative  . No narrative on file       Current Outpatient Prescriptions:  .  aspirin 81 MG EC tablet *ANTIPLATELET*, Take by mouth., Disp: , Rfl:  .  clopidogrel (PLAVIX) 75 mg tablet  *ANTIPLATELET*, Take by mouth., Disp: , Rfl:  .  ferrous sulfate  (FERATAB) 325 (65 FE) MG tablet, Take by mouth., Disp: , Rfl:  .  fluticasone (FLONASE) 50 mcg/actuation nasal spray, 2 sprays by Nasal route., Disp: , Rfl:  .  hydroCHLOROthiazide (HYDRODIURIL) 25 MG tablet, TAKE 1 TABLET BY MOUTH EVERY DAY, Disp: , Rfl: 3 .  insulin asp prt-insulin aspart (NOVOLOG MIX 70/30) 100 unit/mL (70-30) Solution injection, Inject into the skin., Disp: , Rfl:  .  insulin glargine (LANTUS) 100 unit/mL injection, Inject 100 Units into the skin., Disp: , Rfl:  .  simvastatin (ZOCOR) 40 MG tablet, TAKE 1 TABLET BY MOUTH EVERY DAY AT BEDTIME, Disp: , Rfl: 3  A complete ROS was performed with pertinent positives/negatives noted in the HPI. The remainder of the ROS are negative.   Physical Exam:    Ht 1.727 m (5\' 8" )  Wt 90.7 kg (200 lb)  BMI 30.41 kg/m2   General:  Healthy and alert, in no distress, breathing easily. Normal affect. In a pleasant mood. Head: Normocephalic, atraumatic. No masses, or scars. Eyes: Pupils are equal, and reactive to light. Vision is grossly intact. No spontaneous or gaze nystagmus. Ears:  Ear canals are clear. Tympanic membranes are intact, with normal landmarks and the middle ears are clear and healthy. Hearing: Grossly normal. Nose: Nasal cavities are clear with healthy mucosa, no polyps or exudate.Airways are patent. Face: No masses or scars, facial nerve function is symmetric. Oral Cavity: No mucosal abnormalities are noted. Tongue with normal mobility. Dentition appears healthy. Oropharynx: Tonsils are symmetric. There are no mucosal masses identified. Tongue base appears normal and healthy. Larynx/Hypopharynx: deferred Chest: Deferred Neck: Hard level for mass, approximately 3-1/2 cm.  No other adenopathy or masses palpable.. Neuro: Cranial nerves II-XII will normal function. Balance: Normal gate. Other findings: none.   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  Recommend exam under anesthesia with endoscopy and directed biopsies to try to identify a primary site.  I will talk to his cardiologist about stopping the Plavix.  He has seen the dentist this morning and is going to need tooth extraction.  If possible, we will try to coordinate into one procedure.

## 2015-09-19 NOTE — Progress Notes (Signed)
  Oncology Nurse Navigator Documentation  Met with Benjamin Barr during H&N [place].  He was accompanied by his wife.  Arrived him to Nursing, provided verbal and written overview of [place], the clinicians who will be seeing him, encouraged him to ask questions during his time with them.  He was seen by SLP, Nutrition, PT and SW.  Spoke with him at end of [facility], addressed questions.  He accurately reiterated three learning points. He understands I can be contacted with needs/concerns.  [name], RN, BSN, [place] at [facility] [phone]   Navigator Location: [place] (09/16/15 0805) Navigator Encounter Type: Clinic/MDC (09/16/15 0805)               Barriers/Navigation Needs: Coordination of Care (09/16/15 0805)                          Time Spent with Patient: 75 (09/16/15 0805)

## 2015-09-19 NOTE — Interval H&P Note (Signed)
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 After consideration of risks, benefits and other options for treatment, the patient has consented to  Procedure(s) with comments: DIRECT LARYNGOSCOPY AND NASOPHARYNGOSCOPY (N/A) - Biopsies of pharynx, nasopharynx and possible tonsillectomy. ESOPHAGOSCOPY (N/A) as a surgical intervention .  The patient's history has been reviewed, patient examined, no change in status, stable for surgery.  I have reviewed the patient's chart and labs.  Questions were answered to the patient's satisfaction.     Gloria Lambertson

## 2015-09-19 NOTE — Op Note (Signed)
OPERATIVE REPORT  DATE OF SURGERY: 09/19/2015  PATIENT:  Benjamin Barr,  70 y.o. 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  COUNTS:  Correct  PROCEDURE DETAILS: The patient was taken to the operating room and placed on the operating table in the supine position. Following induction of general endotracheal anesthesia, the table was turned 90 and the patient was draped in a standard fashion.  A maxillary tooth protector was used throughout the case.  1. Esophagoscopy. A rigid cervical esophagoscope was passed into the oral cavity through the cricopharyngeal opening and into the cervical esophagus. It was passed as far as it would allow. It was slowly withdrawn while circumferentially examining the mucosal wall. Secretions were suctioned. There were no lesions identified. The scope was removed.  2. Direct laryngoscopy with biopsy. A Dedo laryngoscope was used to examine the oropharynx, hypopharynx and larynx. The subglottis, glottic larynx, supraglottic larynx, vallecula, piriform sinuses, post cricoid area, tonsil area were all unremarkable. There was a slightly friable area involving the right base of tongue. Palpation, this was firm. Biopsies were taken. Frozen section analysis consistent with squamous cell carcinoma. Additional fragments were taken for HPV testing. No further biopsies were taken. Scope was removed. Patient was awakened extubated and transferred to recovery in stable condition.    PATIENT DISPOSITION:  To PACU, stable

## 2015-09-19 NOTE — Transfer of Care (Signed)
Immediate Anesthesia Transfer of Care Note  Patient: Benjamin Barr  Procedure(s) Performed: Procedure(s): DIRECT LARYNGOSCOPY (N/A) ESOPHAGOSCOPY (N/A) FLOOR OF MOUTH BIOPSY (N/A)  Patient Location: PACU  Anesthesia Type:General  Level of Consciousness: awake, oriented and patient cooperative  Airway & Oxygen Therapy: Patient Spontanous Breathing and Patient connected to face mask oxygen  Post-op Assessment: Report given to RN and Post -op Vital signs reviewed and stable  Post vital signs: Reviewed and stable  Last Vitals:  Vitals:   09/19/15 1047  BP: (!) 163/81  Pulse: 89  Resp: 18  Temp: 37.2 C    Last Pain:  Vitals:   09/19/15 1047  TempSrc: Oral         Complications: No apparent anesthesia complications

## 2015-09-19 NOTE — Anesthesia Preprocedure Evaluation (Signed)
Anesthesia Evaluation  Patient identified by MRN, date of birth, ID band Patient awake  General Assessment Comment:Neuropathy   . Coronary artery disease    stent of RCA, LCX with DES 2008 . Hypertension  . Diabetes mellitus  . Hyperlipidemia  . Obesity  . Prostate cancer        Reviewed: Allergy & Precautions, NPO status , Patient's Chart, lab work & pertinent test results  Airway Mallampati: II  TM Distance: >3 FB Neck ROM: Full   Comment: Limited neck extension Dental no notable dental hx. (+) Teeth Intact, Dental Advisory Given   Pulmonary neg pulmonary ROS,    Pulmonary exam normal breath sounds clear to auscultation       Cardiovascular Exercise Tolerance: Good hypertension, Pt. on medications + angina + CAD, + Past MI and + Cardiac Stents  Normal cardiovascular exam+ dysrhythmias + Valvular Problems/Murmurs  Rhythm:Regular Rate:Normal  Echo 2016 - Left ventricle: The cavity size was normal. There was mildconcentric hypertrophy. Systolic function was normal. Theestimated ejection fraction was in the range of 60% to 65%. Wallmotion was normal; there were no regional wall motionabnormalities. There was an increased relative contribution ofatrial contraction to ventricular filling. Doppler parameters areconsistent with abnormal left ventricular relaxation (grade 1diastolic dysfunction). - Aortic valve: Moderate diffuse thickening and calcification,consistent with sclerosis. - Mitral valve: Calcified annulus. Moderate focal calcification ofthe anterior leaflet. There was mild regurgitation. - Pericardium, extracardiac: A trivial pericardial effusion wasidentified posterior to the heart.   Cardiology office note, Dr. [name], 01-18-14: DES LCx and RCA in 2008. Normal myoview in 2013. No current cardiopulmonary symptoms.   Neuro/Psych Peripheral neuropathy.  Neuromuscular disease negative  psych ROS   GI/Hepatic negative GI ROS, Neg liver ROS,   Endo/Other  diabetes, Poorly Controlled, Type 2, Oral Hypoglycemic Agents, Insulin Dependent  Renal/GU negative Renal ROS     Musculoskeletal negative musculoskeletal ROS (+)   Abdominal (+) + obese,   Peds  Hematology negative hematology ROS (+) anemia ,   Anesthesia Other Findings   Reproductive/Obstetrics negative OB ROS                             Anesthesia Physical  Anesthesia Plan  ASA: IV  Anesthesia Plan: General   Post-op Pain Management:    Induction: Intravenous  Airway Management Planned: Oral ETT  Additional Equipment:   Intra-op Plan:   Post-operative Plan: Extubation in OR  Informed Consent: I have reviewed the patients History and Physical, chart, labs and discussed the procedure including the risks, benefits and alternatives for the proposed anesthesia with the patient or authorized representative who has indicated his/her understanding and acceptance.   Dental advisory given  Plan Discussed with: CRNA  Anesthesia Plan Comments:        Anesthesia Quick Evaluation

## 2015-09-19 NOTE — Anesthesia Postprocedure Evaluation (Signed)
Anesthesia Post Note  Patient: Benjamin Barr  Procedure(s) Performed: Procedure(s) (LRB): DIRECT LARYNGOSCOPY (N/A) ESOPHAGOSCOPY (N/A) FLOOR OF MOUTH BIOPSY (N/A)  Patient location during evaluation: PACU Anesthesia Type: General Level of consciousness: sedated and patient cooperative Pain management: pain level controlled Vital Signs Assessment: post-procedure vital signs reviewed and stable Respiratory status: spontaneous breathing Cardiovascular status: stable Anesthetic complications: no    Last Vitals:  Vitals:   09/19/15 1425 09/19/15 1441  BP: (!) 151/76 (!) 147/81  Pulse: 76 76  Resp: 15 16  Temp:      Last Pain:  Vitals:   09/19/15 1441  TempSrc:   PainSc: 0-No pain                 Nolon Nations

## 2015-09-19 NOTE — Anesthesia Procedure Notes (Addendum)
Procedure Name: Intubation Date/Time: 09/19/2015 12:45 PM Performed by: Willeen Cass P Pre-anesthesia Checklist: Patient identified, Emergency Drugs available, Suction available and Patient being monitored Patient Re-evaluated:Patient Re-evaluated prior to inductionOxygen Delivery Method: Circle System Utilized Preoxygenation: Pre-oxygenation with 100% oxygen Intubation Type: IV induction Ventilation: Oral airway inserted - appropriate to patient size and Mask ventilation without difficulty Laryngoscope Size: Miller and 2 Grade View: Grade I Tube type: Oral Tube size: 7.0 mm Number of attempts: 1 Airway Equipment and Method: Stylet and Oral airway Placement Confirmation: ETT inserted through vocal cords under direct vision,  positive ETCO2 and breath sounds checked- equal and bilateral Secured at: 22 cm Tube secured with: Tape Dental Injury: Teeth and Oropharynx as per pre-operative assessment  Comments: Performed by Myna Bright SRNA. ATOI.

## 2015-09-19 NOTE — Progress Notes (Signed)
Report given to robin roberts rn as caregiver 

## 2015-09-22 ENCOUNTER — Ambulatory Visit
Admission: RE | Admit: 2015-09-22 | Discharge: 2015-09-22 | Disposition: A | Discharge status: Home / Self Care | Payer: Medicare Other | Source: Ambulatory Visit | Attending: Radiation Oncology | Admitting: Radiation Oncology

## 2015-09-22 ENCOUNTER — Ambulatory Visit (HOSPITAL_COMMUNITY): Payer: Self-pay | Admitting: Dentistry

## 2015-09-22 ENCOUNTER — Encounter (HOSPITAL_COMMUNITY): Payer: Self-pay | Admitting: Otolaryngology

## 2015-09-22 ENCOUNTER — Encounter: Payer: Self-pay | Admitting: *Deleted

## 2015-09-22 VITALS — BP 146/72 | HR 74

## 2015-09-22 DIAGNOSIS — Z01818 Encounter for other preprocedural examination: Secondary | ICD-10-CM | POA: Diagnosis not present

## 2015-09-22 DIAGNOSIS — Z463 Encounter for fitting and adjustment of dental prosthetic device: Secondary | ICD-10-CM

## 2015-09-22 DIAGNOSIS — C01 Malignant neoplasm of base of tongue: Secondary | ICD-10-CM

## 2015-09-22 DIAGNOSIS — Z510 Encounter for antineoplastic radiation therapy: Secondary | ICD-10-CM | POA: Diagnosis not present

## 2015-09-22 MED ORDER — SODIUM CHLORIDE 0.9% FLUSH
10.0000 mL | Freq: Once | INTRAVENOUS | Status: AC
  Administered 2015-09-22: 10 mL via INTRAVENOUS

## 2015-09-22 MED ORDER — SODIUM FLUORIDE 1.1 % DT GEL: DENTAL | 11 refills | Status: DC

## 2015-09-22 MED FILL — FLUORISHIELD 1.1% GEL: 1.1 % | 30 days supply | Qty: 114 | Fill #0

## 2015-09-22 NOTE — Progress Notes (Signed)
Does patient have an allergy to IV contrast dye?: No.   Has patient ever received premedication for IV contrast dye?: No.   Does patient take metformin?: No.  If patient does take metformin when was the last dose: N/A  Date of lab work: 09/15/15 BUN: 14 CR: 0.88  IV site: Left Forearm  BP (!) (P) 152/72   Pulse (P) 78   Temp (P) 98.8 F (37.1 C)   SpO2 (P) 98% Comment: room air

## 2015-09-22 NOTE — Progress Notes (Signed)
09/22/2015  Patient Name:   Benjamin Barr Date of Birth:   10/23/1945 Medical Record Number: [id number]  BP (!) 146/72   Pulse 74   [name] now presents for insertion of upper and lower fluoride trays and scatter protection devices. SUBJECTIVE: Patient is still tender from the recent dental extractions by the oral surgeon. 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 OBJECTIVE: There is no sign of infection, heme, or ooze. Sutures are loosely intact. Patient is healing in by primary closure involving the mandibular anterior area and lower right quadrant. The maxillary right quadrant will need to heal in by secondary intention. PROCEDURE: Appliances were tried in and adjusted as needed. [place] Postop instructions were provided in a written and verbal format concerning the use and care of appliances. A prescription for FluoriSHIELD sodium fluoride was sent to [facility] outpatient pharmacy. Rx: FluoriSHIELD NaF. Instill one drop of gel per tooth space of fluoride tray. Place over teeth for 5 minutes. Remove. Spit out excess. Repeat nightly. Refills for one year. All questions were answered.  Plan/Recommendations: 1. Patient is to follow-up with his primary dentist, Dr. Johnnye Sima, for maxillary anterior dental restorations.     The patient is to defer additional treatment needs to include crown on tooth #28 and mandibular cast partial denture fabrication after the          radiation therapy by his request. 2. Patient is a follow-up with Dr. Benson Norway on 09/29/2015 for final determination for date of radiation start. Patient most likely will benefit from     delayed start date to 10/06/2015. 3. Patient to return to clinic for periodic oral examination in approximately 2-3 weeks during radiation therapy. 4. Patient to call if questions or problems arise before then.  Lenn Cal, DDS

## 2015-09-22 NOTE — Progress Notes (Signed)
  Head and Neck Cancer Simulation, IMRT treatment planning note   Outpatient  Diagnosis:    ICD-9-CM ICD-10-CM   1. Cancer of base of tongue (HCC) 141.0 C01     The patient was taken to the CT simulator and laid in the supine position on the table. An Aquaplast head and shoulder mask was custom fitted to the patient's anatomy. High-resolution CT axial imaging was obtained of the head and neck with contrast. I verified that the quality of the imaging is good for treatment planning. 1 Medically Necessary Treatment Device was fabricated and supervised by me: Aquaplast mask.   Treatment planning note I plan to treat the patient with IMRT. I plan to treat the patient's base of tongue (assuming path is positive at BOT - if negative, I may add his nasopharynx to the treatment volumes) and bilateral  neck nodes. I plan to treat to a total dose of 70 Gray in 35  fractions. Dose calculation was ordered from dosimetry.  IMRT planning Note  IMRT is an important modality to deliver adequate dose to the patient's at risk tissues while sparing the patient's normal structures, including the: esophagus, parotid tissue, mandible, brain stem, spinal cord, oral cavity, brachial plexus.  This justifies the use of IMRT in the patient's treatment.    -----------------------------------  Eppie Gibson, MD

## 2015-09-22 NOTE — Patient Instructions (Signed)

## 2015-09-22 NOTE — Progress Notes (Signed)
  Oncology Nurse Navigator Documentation  Met with Benjamin Barr following his CT SIM.  He was accompanied by his wife. He stated he managed SIM without difficulty. I showed them the [place] treatment area, explained registration and arrival procedures. They voiced understanding.  [name], RN, BSN, [place] at [facility] [phone]   Navigator Location: [place] [phone]/14/17 1435) Navigator Encounter Type: Clinic/MDC (09/22/15 1435)                 Education: Newly Diagnosed Cancer Education (09/22/15 1435)                        Time Spent with Patient: 30 (09/22/15 1435)

## 2015-09-23 ENCOUNTER — Telehealth: Payer: Self-pay | Admitting: *Deleted

## 2015-09-26 NOTE — Telephone Encounter (Signed)
A user error has taken place: encounter opened in error, closed for administrative reasons.

## 2015-09-29 ENCOUNTER — Ambulatory Visit: Payer: Self-pay | Admitting: Hematology and Oncology

## 2015-09-29 ENCOUNTER — Telehealth: Payer: Self-pay | Admitting: *Deleted

## 2015-09-29 DIAGNOSIS — Z510 Encounter for antineoplastic radiation therapy: Secondary | ICD-10-CM | POA: Diagnosis not present

## 2015-09-30 ENCOUNTER — Telehealth: Payer: Self-pay | Admitting: *Deleted

## 2015-09-30 DIAGNOSIS — Z510 Encounter for antineoplastic radiation therapy: Secondary | ICD-10-CM | POA: Diagnosis not present

## 2015-09-30 NOTE — Telephone Encounter (Signed)
Benjamin Barr, he has appointment to see me in September Please advise him to keep that appointment so I can provide symptom management

## 2015-09-30 NOTE — Telephone Encounter (Signed)
  Oncology Nurse Navigator Documentation  Spoke with Benjamin Barr at Dr. Raynelle Dick office.  She confirmed Mr. Benjamin Barr has been cleared by Dr. Benson Norway to proceed with radiation treatment.  Drs. Herbie Drape and [place] notified.  [name], RN, BSN, [place] at [facility] [phone]   Navigator Location: [place] (09/30/15 1015) Navigator Encounter Type: Telephone (09/30/15 1015) Telephone: [place] (09/30/15 1015)                 Interventions: Coordination of Care (09/30/15 1015)            Acuity: Level 2 (09/30/15 1015)   Acuity Level 2: Initial guidance, education and coordination as needed;Educational needs;Ongoing guidance and education throughout treatment as needed (09/30/15 1015)     Time Spent with Patient: 15 (09/30/15 1015)

## 2015-10-01 ENCOUNTER — Ambulatory Visit
Admission: RE | Admit: 2015-10-01 | Discharge: 2015-10-01 | Disposition: A | Discharge status: Home / Self Care | Payer: Medicare Other | Source: Ambulatory Visit | Attending: Radiation Oncology | Admitting: Radiation Oncology

## 2015-10-01 DIAGNOSIS — C01 Malignant neoplasm of base of tongue: Secondary | ICD-10-CM

## 2015-10-01 DIAGNOSIS — Z510 Encounter for antineoplastic radiation therapy: Secondary | ICD-10-CM | POA: Diagnosis not present

## 2015-10-01 NOTE — Telephone Encounter (Signed)
A user error has taken place: encounter opened in error, closed for administrative reasons.

## 2015-10-01 NOTE — Progress Notes (Signed)
IMRT Device Note    ICD-9-CM ICD-10-CM   1. Cancer of base of tongue (HCC) 141.0 C01     10.0 delivered field widths represent one set of IMRT treatment devices. The code is [phone].  -----------------------------------  Eppie Gibson, MD

## 2015-10-02 ENCOUNTER — Ambulatory Visit
Admission: RE | Admit: 2015-10-02 | Discharge: 2015-10-02 | Disposition: A | Discharge status: Home / Self Care | Payer: Medicare Other | Source: Ambulatory Visit | Attending: Radiation Oncology | Admitting: Radiation Oncology

## 2015-10-02 DIAGNOSIS — Z510 Encounter for antineoplastic radiation therapy: Secondary | ICD-10-CM | POA: Diagnosis not present

## 2015-10-03 ENCOUNTER — Encounter: Payer: Self-pay | Admitting: *Deleted

## 2015-10-03 ENCOUNTER — Ambulatory Visit
Admission: RE | Admit: 2015-10-03 | Discharge: 2015-10-03 | Disposition: A | Discharge status: Home / Self Care | Payer: Medicare Other | Source: Ambulatory Visit | Attending: Radiation Oncology | Admitting: Radiation Oncology

## 2015-10-03 DIAGNOSIS — Z510 Encounter for antineoplastic radiation therapy: Secondary | ICD-10-CM | POA: Diagnosis not present

## 2015-10-03 DIAGNOSIS — C01 Malignant neoplasm of base of tongue: Secondary | ICD-10-CM | POA: Insufficient documentation

## 2015-10-03 MED ORDER — SONAFINE EX EMUL
1.0000 "application " | Freq: Once | CUTANEOUS | Status: AC
  Administered 2015-10-03: 1 via TOPICAL

## 2015-10-03 NOTE — Progress Notes (Signed)

## 2015-10-03 NOTE — Progress Notes (Signed)
  Oncology Nurse Navigator Documentation  To provide support, encouragement and care continuity, met with Benjamin Barr for his initial Tomo RT.  He was accompanied by his wife.  I reviewed the 2-step treatment process, reminded him of the registration/arrival procedure for subsequent treatments.  His wife observed the treatment, RTT Melanie explained procedure.   [name], RN, BSN, [place] at [facility] [phone]   Navigator Location: [place] (10/01/15 1340) Navigator Encounter Type: Initial RadOnc (10/01/15 1340)   Abnormal Finding Date: 08/22/15 (10/01/15 1340) Confirmed Diagnosis Date: 09/10/15 (10/01/15 1340)   Treatment Initiated Date: 10/01/15 (10/01/15 1340)   Treatment Phase: First Radiation Tx (10/01/15 1340)   Education: Preparing for Upcoming Surgery/ Treatment (10/01/15 1340)                        Time Spent with Patient: 30 (10/01/15 1340)

## 2015-10-03 NOTE — Progress Notes (Signed)
  Oncology Nurse Navigator Documentation  To provide support, encouragement and care continuity, met with Mr. Schroth prior to his Tomo tmt. He indicated he has tolerated the first days of treatment without difficulty. He expressed interest in talking with a former patient.  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  He understands I will follow-up with him.  [name], RN, BSN, [place] at [facility] [phone]   Navigator Location: [facility] (10/03/15 1125) Navigator Encounter Type: Treatment (10/03/15 1125)           Patient Visit Type: TMAUQJ (10/03/15 1125)                              Time Spent with Patient: 30 (10/03/15 1125)

## 2015-10-06 ENCOUNTER — Encounter: Payer: Self-pay | Admitting: Radiation Oncology

## 2015-10-06 ENCOUNTER — Ambulatory Visit
Admission: RE | Admit: 2015-10-06 | Discharge: 2015-10-06 | Disposition: A | Discharge status: Home / Self Care | Payer: Medicare Other | Source: Ambulatory Visit | Attending: Radiation Oncology | Admitting: Radiation Oncology

## 2015-10-06 VITALS — BP 143/78 | HR 79 | Temp 98.4°F | Ht 68.0 in | Wt 194.0 lb

## 2015-10-06 DIAGNOSIS — C01 Malignant neoplasm of base of tongue: Secondary | ICD-10-CM

## 2015-10-06 DIAGNOSIS — Z510 Encounter for antineoplastic radiation therapy: Secondary | ICD-10-CM | POA: Diagnosis not present

## 2015-10-06 MED ORDER — LIDOCAINE VISCOUS 2 % MT SOLN: OROMUCOSAL | 5 refills | Status: DC

## 2015-10-06 MED ORDER — SUCRALFATE 1 G PO TABS: ORAL_TABLET | ORAL | 5 refills | Status: DC

## 2015-10-06 MED ORDER — SUCRALFATE 1 G PO TABS
ORAL_TABLET | ORAL | 5 refills | Status: DC
Start: 2015-10-06 — End: 2015-10-06

## 2015-10-06 NOTE — Progress Notes (Signed)
Mr. Schlossberg presents for his 4th fraction of radiation to his Oropharynx and bilateral neck. He denies pain, except when something touches the mass to his Right neck. He denies fatigue. He is eating well per his report, and tells me his appetite has increased recently. He is using the sonafine to his radiation area as directed twice daily. The mass to his neck is red with some white areas noted and he has questions about what will happen with it as radiation continues.   BP (!) 143/78   Pulse 79   Temp 98.4 F (36.9 C)   Ht 5\' 8"  (1.727 m)   Wt 194 lb (88 kg)   SpO2 99% Comment: room air  BMI 29.50 kg/m    Wt Readings from Last 3 Encounters:  10/06/15 194 lb (88 kg)  09/19/15 201 lb (91.2 kg)  09/16/15 201 lb 11.2 oz (91.5 kg)

## 2015-10-06 NOTE — Progress Notes (Addendum)
   Weekly Management Note:  Outpatient    ICD-9-CM ICD-10-CM   1. 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  CBCT/MVCT images/Port film x-rays were reviewed.  The chart was checked. He denies pain, except when something touches the mass to his Right neck. He denies fatigue. He is eating well per his repor . He is using the sonafine to his radiation area as directed twice daily. The mass to his neck is red with some white areas noted and he has questions about what will happen with it as radiation continues.   Physical Findings:  height is 5\' 8"  (1.727 m) and weight is 194 lb (88 kg). His temperature is 98.4 F (36.9 C). His blood pressure is 143/78 (abnormal) and his pulse is 79. His oxygen saturation is 99%.   Wt Readings from Last 3 Encounters:  10/06/15 194 lb (88 kg)  09/19/15 201 lb (91.2 kg)  09/16/15 201 lb 11.2 oz (91.5 kg)   Tumor over right lower neck appears to be invading dermal lymphatics with whitish papules overlying purplish skin  Impression:  The patient is tolerating radiotherapy.  Plan:  Continue radiotherapy as planned. I contacted physics to see if bolus may be placed to increase skin dose at tumor  Rx lidocaine, carafate for future odynophagia. ________________________________   Eppie Gibson, M.D.

## 2015-10-07 ENCOUNTER — Ambulatory Visit
Admission: RE | Admit: 2015-10-07 | Discharge: 2015-10-07 | Disposition: A | Discharge status: Home / Self Care | Payer: Medicare Other | Source: Ambulatory Visit | Attending: Radiation Oncology | Admitting: Radiation Oncology

## 2015-10-07 DIAGNOSIS — Z510 Encounter for antineoplastic radiation therapy: Secondary | ICD-10-CM | POA: Diagnosis not present

## 2015-10-08 ENCOUNTER — Ambulatory Visit
Admission: RE | Admit: 2015-10-08 | Discharge: 2015-10-08 | Disposition: A | Discharge status: Home / Self Care | Payer: Medicare Other | Source: Ambulatory Visit | Attending: Radiation Oncology | Admitting: Radiation Oncology

## 2015-10-08 ENCOUNTER — Encounter: Payer: Self-pay | Admitting: Radiation Oncology

## 2015-10-08 ENCOUNTER — Telehealth: Payer: Self-pay | Admitting: *Deleted

## 2015-10-08 DIAGNOSIS — Z510 Encounter for antineoplastic radiation therapy: Secondary | ICD-10-CM | POA: Diagnosis not present

## 2015-10-08 NOTE — Progress Notes (Signed)
CUSTOM BOLUS NOTE _ TREATMENT DEVICE OUTPATIENT  Today I custom made bolus to place on the patient's neck node, where it is invading skin in the lower right neck.  The bolus is 35mm thick and starting today it will be placed daily during treatment.  Discussed with physics and dosimetry.  -----------------------------------  Eppie Gibson, MD

## 2015-10-08 NOTE — Telephone Encounter (Addendum)
  Oncology Nurse Navigator Documentation  Mr. Benjamin Barr called to report that [facility] did not receive the sucralfate Rx submitted by Dr. Isidore Barr.  I called [facility], noted that Rx receipt confirmed but they denied receiving.  I provided verbal order.  Pharmacist Shirlean Mylar indicated for same co-pay, quantity could be increased, that she would adjust refills to 2.  [name], RN, BSN, [place] at [facility] [phone]   Navigator Location: [place] (10/08/15 1245) Navigator Encounter Type: Telephone (10/08/15 1245) Telephone: Incoming Call;Medication Assistance (10/08/15 1245)                                        Time Spent with Patient: 30 (10/08/15 1245)

## 2015-10-09 ENCOUNTER — Ambulatory Visit
Admission: RE | Admit: 2015-10-09 | Discharge: 2015-10-09 | Disposition: A | Discharge status: Home / Self Care | Payer: Medicare Other | Source: Ambulatory Visit | Attending: Radiation Oncology | Admitting: Radiation Oncology

## 2015-10-09 DIAGNOSIS — Z510 Encounter for antineoplastic radiation therapy: Secondary | ICD-10-CM | POA: Diagnosis not present

## 2015-10-10 ENCOUNTER — Ambulatory Visit
Admission: RE | Admit: 2015-10-10 | Discharge: 2015-10-10 | Disposition: A | Discharge status: Home / Self Care | Payer: Medicare Other | Source: Ambulatory Visit | Attending: Radiation Oncology | Admitting: Radiation Oncology

## 2015-10-10 DIAGNOSIS — C01 Malignant neoplasm of base of tongue: Secondary | ICD-10-CM | POA: Diagnosis not present

## 2015-10-10 DIAGNOSIS — C770 Secondary and unspecified malignant neoplasm of lymph nodes of head, face and neck: Secondary | ICD-10-CM | POA: Diagnosis present

## 2015-10-10 DIAGNOSIS — Z510 Encounter for antineoplastic radiation therapy: Secondary | ICD-10-CM | POA: Diagnosis not present

## 2015-10-10 DIAGNOSIS — R5381 Other malaise: Secondary | ICD-10-CM | POA: Diagnosis not present

## 2015-10-14 ENCOUNTER — Telehealth: Payer: Self-pay | Admitting: Hematology and Oncology

## 2015-10-14 ENCOUNTER — Ambulatory Visit (HOSPITAL_BASED_OUTPATIENT_CLINIC_OR_DEPARTMENT_OTHER): Payer: Medicare Other | Admitting: Hematology and Oncology

## 2015-10-14 ENCOUNTER — Encounter: Payer: Self-pay | Admitting: Hematology and Oncology

## 2015-10-14 ENCOUNTER — Ambulatory Visit
Admission: RE | Admit: 2015-10-14 | Discharge: 2015-10-14 | Disposition: A | Discharge status: Home / Self Care | Payer: Medicare Other | Source: Ambulatory Visit | Attending: Radiation Oncology | Admitting: Radiation Oncology

## 2015-10-14 DIAGNOSIS — C029 Malignant neoplasm of tongue, unspecified: Secondary | ICD-10-CM

## 2015-10-14 DIAGNOSIS — I10 Essential (primary) hypertension: Secondary | ICD-10-CM | POA: Diagnosis not present

## 2015-10-14 DIAGNOSIS — E1140 Type 2 diabetes mellitus with diabetic neuropathy, unspecified: Secondary | ICD-10-CM | POA: Diagnosis not present

## 2015-10-14 DIAGNOSIS — K1233 Oral mucositis (ulcerative) due to radiation: Secondary | ICD-10-CM | POA: Diagnosis not present

## 2015-10-14 DIAGNOSIS — C770 Secondary and unspecified malignant neoplasm of lymph nodes of head, face and neck: Secondary | ICD-10-CM | POA: Diagnosis not present

## 2015-10-14 DIAGNOSIS — R634 Abnormal weight loss: Secondary | ICD-10-CM

## 2015-10-14 DIAGNOSIS — Z510 Encounter for antineoplastic radiation therapy: Secondary | ICD-10-CM | POA: Diagnosis not present

## 2015-10-14 DIAGNOSIS — Z794 Long term (current) use of insulin: Secondary | ICD-10-CM

## 2015-10-14 NOTE — Assessment & Plan Note (Signed)
The patient is on a diuretic therapy for hypertension. He has lost some weight and his blood pressure has started to trend down. Once his weight gets under 190 pounds, I recommend holding off hydrochlorothiazide

## 2015-10-14 NOTE — Assessment & Plan Note (Signed)
He has mild progressive weight loss due to dysgeusia. He  denies dysphagia. He will continue close follow-up with dietitian

## 2015-10-14 NOTE — Assessment & Plan Note (Signed)
He tolerated treatment well with expected side effects with mild mucositis, progressive weight loss and dysgeusia. I will see him back in 2 weeks to provide supportive care

## 2015-10-14 NOTE — Assessment & Plan Note (Signed)
The malignant lymph node had some ulcerated appearance The skin is not broken. I recommend clean gauze application if it start to discharge

## 2015-10-14 NOTE — Assessment & Plan Note (Signed)
He has wide fluctuation of his blood sugar control. He will continue his current prescription insulin regimen as prescribed by his doctor

## 2015-10-14 NOTE — Assessment & Plan Note (Signed)
He has started to experience mucositis from radiation treatment. He was prescribed local medication with Carafate and lidocaine by his radiation oncologist. I recommend he continues the same

## 2015-10-14 NOTE — Progress Notes (Signed)
[facility] OFFICE PROGRESS NOTE  Patient Care Team: Aura Dials, MD as PCP - General (Family Medicine) Beverly Gust, MD as Referring Physician (Otolaryngology) Leota Sauers, RN as Oncology Nurse Navigator Heath Lark, MD as Consulting Physician (Hematology and Oncology) Carolan Clines, MD as Consulting Physician (Urology) Delrae Rend, MD as Consulting Physician (Endocrinology) [name] [name], MD as Consulting Physician (Cardiology) Crista Luria, MD as Consulting Physician (Dermatology)  SUMMARY OF ONCOLOGIC HISTORY:   Tongue cancer [facility])   08/20/2015 Miscellaneous    He was evaluated by ENT      08/22/2015 Imaging    CT neck showed malignant adenopathy right supraclavicular region compatible with metastatic disease. Biopsy recommended. No definite pharyngeal mass lesion identified by CT.       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 No other primary or metastatic cancer. 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 He returns with his wife for supportive care visit. He has started to experience side effects related to radiation treatment. He has some mucositis but has not started to take prescription medicine for this. He denies nausea. He had some recent constipation, resolved. He has altered taste sensation and loss of appetite. He has lost some weight since he was seen here. He is watching the ulcerated  lymph node on his neck carefully. it has not cause any pain. He noted white fluctuation of his blood sugar, sometimes as high as over 300. The patient had no hypoglycemic awareness. He has occasional feeling of dizziness to resolve spontaneously  REVIEW OF SYSTEMS:   Constitutional: Denies fevers, chills  Eyes: Denies blurriness of vision Respiratory: Denies cough, dyspnea or wheezes Cardiovascular: Denies palpitation, chest discomfort or lower extremity swelling Lymphatics: Denies new lymphadenopathy or easy bruising Neurological:Denies numbness, tingling or new weaknesses Behavioral/Psych: Mood is stable, no new changes  All other systems were reviewed with the patient and are negative.  I have reviewed the past medical history, past surgical history, social history and family history with the patient and they are unchanged from previous note.  ALLERGIES:  is allergic to no known allergies.  MEDICATIONS:  Current Outpatient Prescriptions  Medication Sig Dispense Refill  . amoxicillin (AMOXIL) 250 MG capsule Take 250 mg by mouth 3 (three) times daily.    [name] ascorbic Acid (VITAMIN C) 500 MG CPCR Take 500 mg by mouth daily.    [name] aspirin 81 MG tablet Take 81 mg by mouth daily.     . clopidogrel (PLAVIX) 75 MG tablet Take 1 tablet (75 mg total) by mouth daily. 90 tablet 0  . Diphenhyd-Hydrocort-Nystatin (FIRST-DUKES MOUTHWASH) SUSP Use as directed 10 mLs in the mouth or throat daily as needed. For Ulcer pain.    . Ferrous Sulfate (IRON) 325 (65 Fe) MG TABS Take 325 mg  by mouth.    . fluticasone (FLONASE) 50 MCG/ACT nasal spray Place 2 sprays into both nostrils daily.    . hydrochlorothiazide (HYDRODIURIL) 25 MG tablet Take 25 mg by mouth daily.      . insulin aspart protamine- aspart (NOVOLOG MIX 70/30) (70-30) 100 UNIT/ML injection Inject 10-20 Units into the skin daily with supper. 1 unit before large meal. Per sliding scale    . insulin glargine (LANTUS) 100 UNIT/ML injection Inject 100  Units into the skin daily.     [name] lidocaine (XYLOCAINE) 2 % solution Patient: Mix 1part 2% viscous lidocaine, 1part H20. Swish and swallow 26mL of this mixture, 87min before meals and at bedtime, up to QID 100 mL 5  . simvastatin (ZOCOR) 40 MG tablet Take 40 mg by mouth daily.   3  . sodium fluoride (FLUORISHIELD) 1.1 % GEL dental gel Instill one drop of gel per tooth space of fluoride tray. Place over teeth for 5 minutes. Remove. Spit out excess. Repeat nightly. 120 mL 11  . sucralfate (CARAFATE) 1 g tablet Dissolve 1 tablet in 10 mL H20 and swallow up to QID for sore throat. 48 tablet 5  . UNABLE TO FIND Place in ear(s) daily as needed. Med Name: Fluocinolone.  1 drop  Ear drops as needed for ears itching     No current facility-administered medications for this visit.     PHYSICAL EXAMINATION: ECOG PERFORMANCE STATUS: 1 - Symptomatic but completely ambulatory  Vitals:   10/14/15 1255  BP: 134/62  Pulse: 78  Resp: 18  Temp: 98.6 F (37 C)   Filed Weights   10/14/15 1255  Weight: 193 lb 12.8 oz (87.9 kg)    GENERAL:alert, no distress and comfortable SKIN: Noted very mild skin rash from radiation  EYES: normal, Conjunctiva are pink and non-injected, sclera clear OROPHARYNX:no exudate, no erythema and lips, buccal mucosa, and tongue normal . No thrush NECK: supple, thyroid normal size, non-tender, without nodularity LYMPH:  The cervical lymphadenopathy on the right side of the neck look ulcerated LUNGS: clear to auscultation and percussion with normal breathing effort HEART: regular rate & rhythm and no murmurs and no lower extremity edema ABDOMEN:abdomen soft, non-tender and normal bowel sounds Musculoskeletal:no cyanosis of digits and no clubbing  NEURO: alert & oriented x 3 with fluent speech, no focal motor/sensory deficits  LABORATORY DATA:  I have reviewed the data as listed    Component Value Date/Time   NA 137 09/15/2015 1345   K 3.7 09/15/2015 1345   CL 102  09/15/2015 1345   CO2 25 09/15/2015 1345   GLUCOSE 230 (H) 09/15/2015 1345   BUN 14 09/15/2015 1345   CREATININE 0.88 09/15/2015 1345   CREATININE 1.28 (H) 12/06/2014 0950   CALCIUM 10.1 09/15/2015 1345   GFRNONAA >60 09/15/2015 1345   GFRAA >60 09/15/2015 1345    No results found for: SPEP, UPEP  Lab Results  Component Value Date   WBC 11.1 (H) 09/15/2015   NEUTROABS 7.8 (H) 11/07/2014   HGB 15.8 09/15/2015   HCT 46.5 09/15/2015   MCV 85.0 09/15/2015   PLT 162 09/15/2015      Chemistry      Component Value Date/Time   NA 137 09/15/2015 1345   K 3.7 09/15/2015 1345   CL 102 09/15/2015 1345   CO2 25 09/15/2015 1345   BUN 14 09/15/2015 1345   CREATININE 0.88 09/15/2015 1345   CREATININE 1.28 (H) 12/06/2014 0950      Component Value Date/Time  CALCIUM 10.1 09/15/2015 1345     ASSESSMENT & PLAN:  Tongue cancer ([place]) He tolerated treatment well with expected side effects with mild mucositis, progressive weight loss and dysgeusia. 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 I recommend clean gauze application if it start to discharge  Diabetes mellitus with diabetic neuropathy, with long-term current use of insulin ([facility]) He has wide fluctuation of his blood sugar control. He will continue his current prescription insulin regimen as prescribed by his doctor  Hypertension The patient is on a diuretic therapy for hypertension. He has lost some weight and his blood pressure has started to trend down. Once his weight gets under 190 pounds, I recommend holding off hydrochlorothiazide  Weight loss He has mild progressive weight loss due to dysgeusia. He  denies dysphagia. 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 He was  prescribed local medication with Carafate and lidocaine by his radiation oncologist. I recommend he continues the same   No orders of the defined types were placed in this encounter.  All questions were answered. The patient knows to call the clinic with any problems, questions or concerns. No barriers to learning was detected. I spent 20 minutes counseling the patient face to face. The total time spent in the appointment was 25 minutes and more than 50% was on counseling and review of test results     [facility], [place], MD 10/14/2015 1:16 PM

## 2015-10-14 NOTE — Telephone Encounter (Signed)
APPT SCHD GIVEN PER 10/14/15 LOS.

## 2015-10-15 ENCOUNTER — Encounter: Payer: Self-pay | Admitting: Radiation Oncology

## 2015-10-15 ENCOUNTER — Ambulatory Visit
Admission: RE | Admit: 2015-10-15 | Discharge: 2015-10-15 | Disposition: A | Discharge status: Home / Self Care | Payer: Medicare Other | Source: Ambulatory Visit | Attending: Radiation Oncology | Admitting: Radiation Oncology

## 2015-10-15 VITALS — BP 141/71 | HR 76 | Temp 98.4°F | Ht 68.0 in | Wt 191.8 lb

## 2015-10-15 DIAGNOSIS — C01 Malignant neoplasm of base of tongue: Secondary | ICD-10-CM

## 2015-10-15 DIAGNOSIS — Z510 Encounter for antineoplastic radiation therapy: Secondary | ICD-10-CM | POA: Diagnosis not present

## 2015-10-15 MED ORDER — FENTANYL 12 MCG/HR TD PT72: 12.5000 ug | MEDICATED_PATCH | TRANSDERMAL | 0 refills | Status: DC

## 2015-10-15 MED ORDER — HYDROCODONE-ACETAMINOPHEN 7.5-325 MG/15ML PO SOLN: ORAL | 0 refills | Status: DC

## 2015-10-15 NOTE — Progress Notes (Signed)
Benjamin Barr is here for his 10th fraction of radiation to his Oropharynx and bilateral neck. He rates his pain a 3/10 in his throat at this time. He does tell me his pain increases at night, and he has difficulty sleeping. He is using the lidocaine rinses, and carafate but feels like it has not helped him very much. He has a decreased appetite, and tells me he is eating softer foods but not has much. He is drinking one ensure daily. He is drinking three 16 ounce bottles of water daily. He is also using the baking soda rinses for thick saliva and feels like this has helped him. His neck is red, with no open areas noted. He is using the sonafine twice daily. He is concerned about a facial rash he has and would like to ask what it might be. He reports some mild constipation, and plans to start a stool softener soon.  BP (!) 141/71   Pulse 76   Temp 98.4 F (36.9 C)   Ht 5\' 8"  (1.727 m)   Wt 191 lb 12.8 oz (87 kg)   SpO2 98% Comment: room air  BMI 29.16 kg/m    Wt Readings from Last 3 Encounters:  10/15/15 191 lb 12.8 oz (87 kg)  10/14/15 193 lb 12.8 oz (87.9 kg)  10/06/15 194 lb (88 kg)

## 2015-10-15 NOTE — Progress Notes (Signed)
   Weekly Management Note:  Outpatient    ICD-9-CM ICD-10-CM   1. 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  CBCT/MVCT images/Port film x-rays were reviewed.  The chart was checked. . Benjamin Barr is here for his 10th fraction of radiation to his oropharynx and bilateral neck. He rates his pain a 3/10 in his throat at this time. He does tell me his pain increases at night, and he has difficulty sleeping. He is using the lidocaine rinses, and carafate but feels like it has not helped him very much. He has a decreased appetite, and tells me he is eating softer foods but not as much. He is drinking one Ensure daily. He is drinking 3 16oz bottles of water daily. He is also using the baking soda rinses for thick saliva and feels like this has helped him. His neck is red, with no open areas noted. He is using the Sonafine twice daily. He is concerned about a facial rash he has and would like to ask what it may be. He reports some mild constipation, and plans to start a stool softener soon.  Physical Findings:  height is 5\' 8"  (1.727 m) and weight is 191 lb 12.8 oz (87 kg). His temperature is 98.4 F (36.9 C). His blood pressure is 141/71 (abnormal) and his pulse is 76. His oxygen saturation is 98%.   Wt Readings from Last 3 Encounters:  10/15/15 191 lb 12.8 oz (87 kg)  10/14/15 193 lb 12.8 oz (87.9 kg)  10/06/15 194 lb (88 kg)   HENT:Oropharynx is notable for erythema, but not thrush Neck: Skin over neck is intact with exception of desquamation over dominant neck mass which is relatively stable in size. Faint erythematous speckled rash over lower face presumed r/t low dose radiotherapy or unrelated etiology  Impression:  The patient is tolerating radiotherapy.  Plan:   Continue radiotherapy as planned. Recommend Mirilax for constipation, prescribing Duragesic and Hycet for pain.   ________________________________   Eppie Gibson, M.D.  This document serves as a record of services personally performed by Eppie Gibson, MD. It was created on her behalf by Bethann Humble, a trained medical scribe. The creation of this record is based on the scribe's personal observations and the provider's statements to them. This document has been checked and approved by the attending provider.

## 2015-10-16 ENCOUNTER — Ambulatory Visit
Admission: RE | Admit: 2015-10-16 | Discharge: 2015-10-16 | Disposition: A | Discharge status: Home / Self Care | Payer: Medicare Other | Source: Ambulatory Visit | Attending: Radiation Oncology | Admitting: Radiation Oncology

## 2015-10-16 DIAGNOSIS — Z510 Encounter for antineoplastic radiation therapy: Secondary | ICD-10-CM | POA: Diagnosis not present

## 2015-10-17 ENCOUNTER — Encounter: Payer: Self-pay | Admitting: *Deleted

## 2015-10-17 ENCOUNTER — Ambulatory Visit
Admission: RE | Admit: 2015-10-17 | Discharge: 2015-10-17 | Disposition: A | Discharge status: Home / Self Care | Payer: Medicare Other | Source: Ambulatory Visit | Attending: Radiation Oncology | Admitting: Radiation Oncology

## 2015-10-17 ENCOUNTER — Ambulatory Visit: Payer: Medicare Other | Admitting: Nutrition

## 2015-10-17 DIAGNOSIS — Z510 Encounter for antineoplastic radiation therapy: Secondary | ICD-10-CM | POA: Diagnosis not present

## 2015-10-17 NOTE — Progress Notes (Signed)
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 Weight documented as 191.8 pounds on September 6, down from 201.5 pounds August 8. Patient reports fasting blood sugars 160-180. He reports increased pain which is not controlled. He has decreased oral intake secondary to poor appetite and taste alterations.  Estimated nutrition needs: 2000-2200 calories, 110-125 grams protein, greater than 2.2 L fluid.  Nutrition diagnosis:  Predicted suboptimal energy intake has evolved into inadequate oral intake related to head and neck cancer as evidenced by 10 pound weight loss in one month.  Intervention: Encouraged patient to continue to try soft or pured foods as tolerated focusing on protein high calorie foods. Provided additional oral nutrition supplement samples. Encouraged patient's wife to consider making smoothies if patient is agreeable. Patient answered.  Teach back method used  Monitoring, evaluation, goals: Patient will tolerate increased calories and protein to minimize further weight loss.  Next visit: Wednesday, September 13.  **Disclaimer: This note was dictated with voice recognition software. Similar sounding words can inadvertently be transcribed and this note may contain transcription errors which may not have been corrected upon publication of note.**

## 2015-10-20 ENCOUNTER — Ambulatory Visit
Admission: RE | Admit: 2015-10-20 | Discharge: 2015-10-20 | Disposition: A | Discharge status: Home / Self Care | Payer: Medicare Other | Source: Ambulatory Visit | Attending: Radiation Oncology | Admitting: Radiation Oncology

## 2015-10-20 ENCOUNTER — Ambulatory Visit (HOSPITAL_COMMUNITY): Payer: Self-pay | Admitting: Dentistry

## 2015-10-20 DIAGNOSIS — Z510 Encounter for antineoplastic radiation therapy: Secondary | ICD-10-CM | POA: Diagnosis not present

## 2015-10-20 NOTE — Progress Notes (Signed)
Oncology Nurse Navigator Documentation  Met with Benjamin Barr prior to his Tomo treatment, provided information for his patient mentor, Benjamin Barr.  [name], RN, BSN, [place] at [place] [phone]

## 2015-10-21 ENCOUNTER — Encounter (HOSPITAL_COMMUNITY): Payer: Self-pay | Admitting: Dentistry

## 2015-10-21 ENCOUNTER — Ambulatory Visit (HOSPITAL_COMMUNITY): Payer: Self-pay | Admitting: Dentistry

## 2015-10-21 ENCOUNTER — Encounter (INDEPENDENT_AMBULATORY_CARE_PROVIDER_SITE_OTHER): Payer: Self-pay

## 2015-10-21 ENCOUNTER — Ambulatory Visit
Admission: RE | Admit: 2015-10-21 | Discharge: 2015-10-21 | Disposition: A | Discharge status: Home / Self Care | Payer: Medicare Other | Source: Ambulatory Visit | Attending: Radiation Oncology | Admitting: Radiation Oncology

## 2015-10-21 VITALS — BP 154/72 | HR 83 | Temp 98.1°F | Wt 191.0 lb

## 2015-10-21 DIAGNOSIS — C01 Malignant neoplasm of base of tongue: Secondary | ICD-10-CM | POA: Diagnosis not present

## 2015-10-21 DIAGNOSIS — Z0189 Encounter for other specified special examinations: Secondary | ICD-10-CM

## 2015-10-21 DIAGNOSIS — M272 Inflammatory conditions of jaws: Secondary | ICD-10-CM

## 2015-10-21 DIAGNOSIS — R432 Parageusia: Secondary | ICD-10-CM

## 2015-10-21 DIAGNOSIS — Z510 Encounter for antineoplastic radiation therapy: Secondary | ICD-10-CM | POA: Diagnosis not present

## 2015-10-21 DIAGNOSIS — K117 Disturbances of salivary secretion: Secondary | ICD-10-CM

## 2015-10-21 DIAGNOSIS — R682 Dry mouth, unspecified: Secondary | ICD-10-CM

## 2015-10-21 DIAGNOSIS — R1310 Dysphagia, unspecified: Secondary | ICD-10-CM

## 2015-10-21 NOTE — Patient Instructions (Addendum)
RECOMMENDATIONS: 1. Brush after meals and at bedtime.  Use fluoride at bedtime. 2. Use trismus exercises as directed. 3. Use Biotene Rinse or salt water/baking soda rinses. 4. Multiple sips of water as needed. 5. Return to clinic in two months for oral exam after radiation therapy. 6. Consider referral to Dr. Benson Norway for evaluation of UR molar area.  Call if problems before then.

## 2015-10-21 NOTE — Progress Notes (Signed)
10/21/2015  Patient Name:   Benjamin Barr Date of Birth:   Apr 11, 1945 Medical Record Number: [id number]  BP (!) 154/72 (BP Location: Left Arm)   Pulse 83   Temp 98.1 F (36.7 C) (Oral)   Wt 191 lb (86.6 kg)   BMI 29.04 kg/m   [name] presents for oral examination during radiation therapy. Patient has completed 14/35 radiation treatments. No chemotherapy.  REVIEW OF CHIEF COMPLAINTS:  DRY MOUTH: Yes HARD TO SWALLOW: Yes  HURT TO SWALLOW: Yes TASTE CHANGES: Yes SORES IN MOUTH: No TRISMUS: No trismus symptoms WEIGHT: 191 pounds.  HOME OH REGIMEN:  BRUSHING: Once a day FLOSSING: Once a day RINSING: Using salt water and baking soda rinses and Biotene rinses FLUORIDE: Using fluoride at night TRISMUS EXERCISES:  Maximum interincisal opening: 40 mm   DENTAL EXAM:  Oral Hygiene:(PLAQUE): Good oral hygiene LOCATION OF MUCOSITIS: Back of throat/soft palate DESCRIPTION OF SALIVA: Decreased saliva. ANY EXPOSED BONE: Maxillary right molar area. 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  RECOMMENDATIONS: 1. Brush after meals and at bedtime.  Use fluoride at bedtime. 2. Use trismus exercises as directed. 3. Use Biotene Rinse or salt water/baking soda rinses. 4. Multiple sips of water as needed. 5. Return to clinic in two months for oral exam after radiation therapy. 6. Consider referral to Dr. Benson Norway for evaluation of UR molar area.  Call if problems before then.  Lenn Cal, DDS

## 2015-10-22 ENCOUNTER — Ambulatory Visit: Payer: Medicare Other | Admitting: Nutrition

## 2015-10-22 ENCOUNTER — Encounter: Payer: Self-pay | Admitting: Radiation Oncology

## 2015-10-22 ENCOUNTER — Ambulatory Visit
Admission: RE | Admit: 2015-10-22 | Discharge: 2015-10-22 | Disposition: A | Discharge status: Home / Self Care | Payer: Medicare Other | Source: Ambulatory Visit | Attending: Radiation Oncology | Admitting: Radiation Oncology

## 2015-10-22 DIAGNOSIS — C01 Malignant neoplasm of base of tongue: Secondary | ICD-10-CM

## 2015-10-22 DIAGNOSIS — Z510 Encounter for antineoplastic radiation therapy: Secondary | ICD-10-CM | POA: Diagnosis not present

## 2015-10-22 MED ORDER — FENTANYL 25 MCG/HR TD PT72: 25.0000 ug | MEDICATED_PATCH | TRANSDERMAL | 0 refills | Status: DC

## 2015-10-22 MED ORDER — HYDROCODONE-ACETAMINOPHEN 7.5-325 MG/15ML PO SOLN: ORAL | 0 refills | Status: DC

## 2015-10-22 NOTE — Progress Notes (Signed)
   Weekly Management Note:  Outpatient    ICD-9-CM ICD-10-CM   1. 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  CBCT/MVCT images/Port film x-rays were reviewed.  The chart was checked.  Benjamin Barr presents for his 15th fraction of radiation to his oropharynx and bilateral neck. He reports pain in his throat and is using a 12 mcg Fentanyl patch and Hycet twice daily for pain relief. He feels like the Fentanyl patch is helping him quite a bit. His neck is red with the mass visible to his right neck without drainage present. He has not been eating well, and saw the nutritionist today who helped him and gave him samples of boost and protein powder to use. He is having bowel movements after using stool softeners.   Physical Findings:  height is 5\' 8"  (1.727 m) and weight is 191 lb 1.6 oz (86.7 kg). His temperature is 98.4 F (36.9 C). His blood pressure is 154/67 (abnormal) and his pulse is 82. His oxygen saturation is 99%.   Wt Readings from Last 3 Encounters:  10/22/15 191 lb 1.6 oz (86.7 kg)  10/21/15 191 lb (86.6 kg)  10/15/15 191 lb 12.8 oz (87 kg)  Patchy mucositis over his palette. Skin over his neck slightly erythematous. Protuberant nodule in right lower neck.  Impression:  The patient is tolerating radiotherapy.  Plan:  Continue radiotherapy as planned. Refill liquid hydrocodone. Increase dose of Fentanyl to 25 mcg. Recommended switching to Mirilax due to the increase in pain medication. ________________________________   Benjamin Barr, M.D.  This document serves as a record of services personally performed by Benjamin Gibson, MD. It was created on her behalf by Bethann Humble, a trained medical scribe. The creation of this record is based on the scribe's  personal observations and the provider's statements to them. This document has been checked and approved by the attending provider.

## 2015-10-22 NOTE — Progress Notes (Signed)
Mr. Nuckolls presents for his 15th fraction of radiation to his Oropharynx and bilateral neck. He reports pain in his throat and is using a 88mcg Fentanyl patch and Hycet twice daily for pain relief. He feels like the Fentanyl patch is helping quite a bit. His neck is red with the mass visible to his Right Neck without drainage present. He has not been eating well, and saw the nutritionist today who helped them and gave them samples of boost and protein powder to use. He is having bowel movements after using stool softners.   BP (!) 154/67   Pulse 82   Temp 98.4 F (36.9 C)   Ht 5\' 8"  (1.727 m)   Wt 191 lb 1.6 oz (86.7 kg)   SpO2 99% Comment: room air  BMI 29.06 kg/m    Wt Readings from Last 3 Encounters:  10/22/15 191 lb 1.6 oz (86.7 kg)  10/21/15 191 lb (86.6 kg)  10/15/15 191 lb 12.8 oz (87 kg)

## 2015-10-22 NOTE — Progress Notes (Signed)
Nutrition follow-up completed with patient and wife.  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 Current weight is stable and documented as 191 pounds on September 12. Fasting blood sugars continue to be elevated at 160-180. Patient reports pain is more control. He continues to have decreased oral intake with wife reporting average calorie intake between 400 and 1100 cal daily. Patient reports he slept all night for the first time in a long time last night and he feels really good today.  Estimated nutrition needs: 2000-2200 calories, 110-125 grams protein, greater than 2.2 L fluid.  Nutrition diagnosis: Inadequate oral intake continues.  Intervention: Educated patient to increase oral nutrition supplements to a minimum of 3 daily. Recommended he continue to try to eat high-calorie, high-protein foods at mealtimes. Patient agrees to work harder at increasing oral intake. Teach back method used.  Monitoring, evaluation, goals: Patient will work to increase calories and protein to minimize weight loss.  Next visit: Monday, September 18.  **Disclaimer: This note was dictated with voice recognition software. Similar sounding words can inadvertently be transcribed and this note may contain transcription errors which may not have been corrected upon publication of note.**

## 2015-10-23 ENCOUNTER — Ambulatory Visit
Admission: RE | Admit: 2015-10-23 | Discharge: 2015-10-23 | Disposition: A | Discharge status: Home / Self Care | Payer: Medicare Other | Source: Ambulatory Visit | Attending: Radiation Oncology | Admitting: Radiation Oncology

## 2015-10-23 DIAGNOSIS — Z510 Encounter for antineoplastic radiation therapy: Secondary | ICD-10-CM | POA: Diagnosis not present

## 2015-10-24 ENCOUNTER — Ambulatory Visit: Payer: Medicare Other

## 2015-10-27 ENCOUNTER — Encounter: Payer: Self-pay | Admitting: Radiation Oncology

## 2015-10-27 ENCOUNTER — Ambulatory Visit: Payer: Medicare Other | Attending: Radiation Oncology

## 2015-10-27 ENCOUNTER — Ambulatory Visit: Payer: Medicare Other | Admitting: Nutrition

## 2015-10-27 ENCOUNTER — Ambulatory Visit: Payer: Medicare Other

## 2015-10-27 ENCOUNTER — Ambulatory Visit
Admission: RE | Admit: 2015-10-27 | Discharge: 2015-10-27 | Disposition: A | Discharge status: Home / Self Care | Payer: Medicare Other | Source: Ambulatory Visit | Attending: Radiation Oncology | Admitting: Radiation Oncology

## 2015-10-27 VITALS — Temp 97.8°F | Ht 68.0 in | Wt 186.9 lb

## 2015-10-27 DIAGNOSIS — R1310 Dysphagia, unspecified: Secondary | ICD-10-CM | POA: Diagnosis present

## 2015-10-27 DIAGNOSIS — C01 Malignant neoplasm of base of tongue: Secondary | ICD-10-CM

## 2015-10-27 DIAGNOSIS — Z510 Encounter for antineoplastic radiation therapy: Secondary | ICD-10-CM | POA: Diagnosis not present

## 2015-10-27 NOTE — Progress Notes (Addendum)
Benjamin Barr is here for his 17th fraction of radiation to his Oropharynx/ Bilateral neck. He denies pain at this time. He is using a 69mcg Fentanyl patch which he has had on for 5 days. He is taking Hycet about twice daily. He does tell me that since he started the 25 mcg Fentanyl he occasionally feels dizzy. His wife noted that he was slightly disoriented during the night last night. He tells me he has been checking his blood sugars and they have been high recently. He is still taking Lantus insulin at night. He is using the baking soda rinse for thick saliva with improvement. He is drinking 1 nutritional supplement daily. He is eating very little per his wife's report. He tells me this is because of taste changes. His wife has been keeping a food diary and reports that he averages about 400 calories a day, and very little protein. He is drinking close to 32 ounces of water daily. He reports constipation, and his having one bowel movement a week. He is taking one colace daily. His neck is red, and tender. The area to his Right Neck is dry without any open areas visible. He is using the sonafine cream twice daily directed.   BP (!) 163/76   Pulse 69   Temp 97.8 F (36.6 C)   Ht 5\' 8"  (1.727 m)   Wt 186 lb 14.4 oz (84.8 kg)   SpO2 97% Comment: room air  BMI 28.42 kg/m    Orthostatics: BP sitting 163/76, pulse 69. BP standing 136/75, pulse 79   Wt Readings from Last 3 Encounters:  10/27/15 186 lb 14.4 oz (84.8 kg)  10/22/15 191 lb 1.6 oz (86.7 kg)  10/21/15 191 lb (86.6 kg)

## 2015-10-27 NOTE — Progress Notes (Signed)
   Weekly Management Note:  Outpatient    ICD-9-CM ICD-10-CM   1. 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  CBCT/MVCT images/Port film x-rays were reviewed.  The chart was checked.  Mr. Dippold is here for his 17th fraction of radiation to his Oropharynx/ Bilateral neck. He denies pain at this time. He is using a 53mcg Fentanyl patch which he has had on for 5 days. He is taking Hycet about twice daily.  Using baking soda rinse for thick saliva with improvement. He is drinking 1 nutritional supplement daily. He is eating very little per his wife's report. He tells me this is because of taste changes. His wife has been keeping a food diary and reports that he averages about 400 calories a day, and very little protein. He is drinking close to 32 ounces of water daily. He reports constipation, and his having one bowel movement a week. He is taking one colace daily. His neck is red, and tender. The area to his Right Neck is dry without any open areas visible. He is using the sonafine cream twice daily directed.   Solitary episode of confusion at night. No confusion at day time.  BP (!) 163/76   Pulse 69   Temp 97.8 F (36.6 C)   Ht 5\' 8"  (1.727 m)   Wt 186 lb 14.4 oz (84.8 kg)   SpO2 97% Comment: room air  BMI 28.42 kg/m    Orthostatics: BP sitting 163/76, pulse 69. BP standing 136/75, pulse 79   Physical Findings:  height is 5\' 8"  (1.727 m) and weight is 186 lb 14.4 oz (84.8 kg). His temperature is 97.8 F (36.6 C). His oxygen saturation is 97%.   Wt Readings from Last 3 Encounters:  10/28/15 185 lb 4.8 oz (84.1 kg)  10/27/15 186 lb 14.4 oz (84.8 kg)  10/22/15 191 lb 1.6 oz (86.7 kg)  Patchy mucositis over his palette . Skin over his neck is erythematous. Protuberant nodule in right lower neck is regressing  Impression:  The patient is tolerating  radiotherapy.  Plan:  Continue radiotherapy as planned. Spent a lengthy amount of time discussing importance of PO intake; otherwise, PEG will be needed.  Note weight loss. Use Mirilax due to pain medication.  Confusion at night was transient.  Wife is watching him.  They will let us know if it worsens. ________________________________   Eppie Gibson, M.D.  This document serves as a record of services personally performed by Eppie Gibson, MD. It was created on her behalf by Bethann Humble, a trained medical scribe. The creation of this record is based on the scribe's personal observations and the provider's statements to them. This document has been checked and approved by the attending provider.

## 2015-10-27 NOTE — Therapy (Signed)
[facility] [address] [place], [place], [address] Phone: [phone]   Fax:  [phone]  Speech Language Pathology Treatment  Patient Details  Name: Benjamin Barr MRN: [id number] Date of Birth: 03-23-1945 Referring Provider: Eppie Gibson MD  Encounter Date: 10/27/2015      End of Session - 10/27/15 1707    Visit Number 2   Number of Visits 3   Date for SLP Re-Evaluation 11/28/15   SLP Start Time 52   SLP Stop Time  1545   SLP Time Calculation (min) 40 min   Activity Tolerance Patient tolerated treatment well      Past Medical History:  Diagnosis Date  . Anemia   . CAD (coronary artery disease)    a. 2008 PCI: Taxus DES  to mRCA, prox & mid LCFX;  b. 10/2014 MV: extensive ischemia in the RCA territory and small area of anterior ischemia;  c. 11/2014 PCI: LM nl, LAD 30ost/prox, 80d, LCX 90ost (3.0x12 Promus Prem DES), OM2 30, RCA 17m (3.0x12 Promus Prem DES), 78m, RPDA 90 (PTCA), EF 55-65%.  . Cancer ([facility])    Head & neck ca  . Diverticulosis of colon   . Heart murmur   . History of colon polyps    2014--  hyperplastic, adenoma, and benign polyps  . Hyperlipidemia   . Hypertension   . Myocardial infarction ([facility])   . 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 Obesity   . Peripheral neuropathy ([place])   . Pneumonia    hx  . Prostate cancer ([facility])   . RBBB (right bundle branch block)   . Skin cancer   . Type 2 diabetes mellitus ([place])     Past Surgical History:  Procedure Laterality Date  . APPENDECTOMY    . CARDIAC CATHETERIZATION N/A 11/13/2014   Procedure: Left Heart Cath and Coronary Angiography;  Surgeon: [name] [name], MD;  Location: [facility];  Service: Cardiovascular;  Laterality: N/A;  . CARDIAC CATHETERIZATION  11/13/2014   Procedure: Coronary Stent Intervention;  Surgeon: [name] [name], MD;  Location: [facility];  Service: Cardiovascular;;  . CARDIOVASCULAR STRESS TEST   01-17-2012  dr [name]   Normal perfusion study/ no ischemia or infarction/  normal LVF and wall motion, ef 54%  . CORONARY ANGIOPLASTY WITH STENT PLACEMENT  05/04/2006   dr [name]   Severe 2-vessel obstructive CAD/  normal LVF/  PCI with DES to proxima and mid CFX and mRCA (total 3 DES)  . CRYOABLATION N/A 04/01/2014   Procedure: CRYO ABLATION PROSTATE;  Surgeon: [name], MD;  Location: [facility];  Service: Urology;  Laterality: N/A;  . DIRECT LARYNGOSCOPY N/A 09/19/2015   Procedure: DIRECT LARYNGOSCOPY;  Surgeon: Izora Gala, MD;  Location: [facility];  Service: ENT;  Laterality: N/A;  . ESOPHAGOSCOPY N/A 09/19/2015   Procedure: ESOPHAGOSCOPY;  Surgeon: Izora Gala, MD;  Location: [facility];  Service: ENT;  Laterality: N/A;  . FLOOR OF MOUTH BIOPSY N/A 09/19/2015   Procedure: FLOOR OF MOUTH BIOPSY;  Surgeon: Izora Gala, MD;  Location: [facility];  Service: ENT;  Laterality: N/A;  . INSERTION OF SUPRAPUBIC CATHETER N/A 04/01/2014   Procedure: INSERTION OF SUPRAPUBIC CATHETER;  Surgeon: [name], MD;  Location: [facility];  Service: Urology;  Laterality: N/A;  suprapubic   . MULTIPLE TOOTH EXTRACTIONS  09/17/2015  . PARTIAL COLECTOMY  80's    There were no vitals filed for this visit.  Subjective Assessment - 10/27/15 1507    Subjective "Food has no taste.Marland KitchenMarland KitchenI don't want it."   Patient is accompained by: --  wife               ADULT SLP TREATMENT - 10/27/15 1508      General Information   Behavior/Cognition Alert;Cooperative;Pleasant mood     Treatment Provided   Treatment provided Dysphagia     Dysphagia Treatment   Temperature Spikes Noted No   Treatment Methods Skilled observation;Therapeutic exercise;Patient/caregiver education   Patient observed directly with PO's Yes   Type of PO's observed Dysphagia 1 (puree);Thin liquids   Liquids provided via Cup   Oral Phase Signs & Symptoms --  none noted   Pharyngeal Phase Signs  & Symptoms --  none noted   Other treatment/comments Pt is not eating due to dysgeusia. He ate applesauce (when given choice of  food items) and drank H2O without overt s/s aspiration. SLP encouraged pt to try to [place] something as far into rad tx as possible and re-educated pt about muscle disuse atrophy. Pt at first req'd total A re: why he is doing HEP, but req'd min-mod A 15 minutes later. In 10 more minutes, pt was independent with telling SLP why he was completing HEP. Pt told SLP 3 s/s aspiration PNA with independence.       Pain Assessment   Pain Assessment No/denies pain     Assessment / Recommendations / Plan   Plan Continue with current plan of care     Progression Toward Goals   Progression toward goals Progressing toward goals          SLP Education - 10/27/15 1707    Education provided Yes   Education Details HEP, late effects head/neck rad on swallow skills (fibrosis, atrophy), eat POs as deep into rad tx as possible   Person(s) Educated Patient;Spouse   Methods Explanation;Demonstration   Comprehension Verbalized understanding;Returned demonstration;Verbal cues required          SLP Short Term Goals - 10/27/15 1710      SLP SHORT TERM GOAL #1   Title pt will perform HEP with rare min A    Time 2   Period --  visits   Status On-going     SLP SHORT TERM GOAL #2   Title pt will tell SLP why he is completing HEP   Time 2   Period --  visits   Status On-going          SLP Long Term Goals - 10/27/15 1710      SLP LONG TERM GOAL #1   Title pt will complete HEP with inependence   Time 3   Period --  vistis   Status On-going     SLP LONG TERM GOAL #2   Title pt will tell SLP 3 s/s aspiration PNA with modified independence   Time 3   Period --  visits   Status On-going          Plan - 10/27/15 1708    Clinical Impression Statement Pt presents today with swallowing essentially WNL, however is having difficulty with POs due to dysgeusia.  SLP req'd to cue pt for HEP as well as reasoning for why pt is completing HEP. Cont'd skilled ST is necessary to assess pt's cont'd procedure with HEP as well as to assess pt's safety with POs.   Speech Therapy Frequency --  once approx every four weeks   Duration --  3  visits   Treatment/Interventions Aspiration precaution training;Pharyngeal strengthening exercises;Diet toleration management by SLP;Compensatory techniques;Trials of upgraded texture/liquids;Cueing hierarchy;Patient/family education;Oral motor exercises;SLP instruction and feedback   Potential to Achieve Goals Good   Potential Considerations Cooperation/participation level   Consulted and Agree with Plan of Care Patient      Patient will benefit from skilled therapeutic intervention in order to improve the following deficits and impairments:   Dysphagia    Problem List Patient Active Problem List   Diagnosis Date Noted  . 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 Weight loss 10/14/2015  . 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 Cancer of base of tongue ([facility]) 09/22/2015  . History of prostate cancer 09/03/2015  . Tongue cancer ([facility]) 09/03/2015  . Unstable angina ([facility]) 11/14/2014  . Abnormal nuclear stress test 11/13/2014  . Angina pectoris ([place]) 11/13/2014  . PMR (polymyalgia rheumatica) (HCC) 11/02/2014  . Chest pain with moderate risk for cardiac etiology 10/31/2014  . Diabetes mellitus with diabetic neuropathy, with long-term current use of insulin ([facility]) 12/28/2010  . CAD S/P percutaneous coronary angioplasty   . Hypertension   . Hyperlipidemia   . Obesity     Mathis Cashman ,MS, CCC-SLP  10/27/2015, 5:10 PM  [facility] [address] [place], [place], [address] Phone: [phone]   Fax:  [phone]   Name: Benjamin Barr MRN: [id number] Date of Birth: 1945/08/26

## 2015-10-27 NOTE — Progress Notes (Signed)
Nutrition follow-up completed with patient and wife patient receiving his radiation therapy for right neck squamous cell cancer. Current weight documented as 186 pounds decreased from 191 pounds September 12. Patient reports he has been groggy when using liquid hydrocodone along with fentanyl patch. Patient reports average calorie intake approximately 400 cal daily over the past 5 days. Patient does not feel well today. He reports drinking no more than 2 bottles of water a day.  Estimated nutrition needs: 2000-2200 calories, 110-125 grams protein, greater than 2.2 L fluid.  Nutrition diagnosis: Inadequate oral intake continues.  Intervention: Stressed importance of patient pushing oral nutrition and hydration despite taste alterations. Educated patient on feeding tube placement. Encouraged patient to consider nausea medications for abdominal discomfort and early morning nausea. Provided additional samples of boost very high-calorie and encouraged patient to increase consumption. Stressed importance of increased hydration.  Monitoring, evaluation, goals:  Patient will work to increase calories and protein. Recommend feeding tube placement for adequate nutrition and hydration.  Next visit: Thursday, September 28.  **Disclaimer: This note was dictated with voice recognition software. Similar sounding words can inadvertently be transcribed and this note may contain transcription errors which may not have been corrected upon publication of note.**

## 2015-10-28 ENCOUNTER — Ambulatory Visit
Admission: RE | Admit: 2015-10-28 | Discharge: 2015-10-28 | Disposition: A | Discharge status: Home / Self Care | Payer: Medicare Other | Source: Ambulatory Visit | Attending: Radiation Oncology | Admitting: Radiation Oncology

## 2015-10-28 ENCOUNTER — Encounter: Payer: Self-pay | Admitting: Hematology and Oncology

## 2015-10-28 ENCOUNTER — Ambulatory Visit (HOSPITAL_BASED_OUTPATIENT_CLINIC_OR_DEPARTMENT_OTHER): Payer: Medicare Other | Admitting: Hematology and Oncology

## 2015-10-28 VITALS — BP 162/71 | HR 73 | Temp 98.4°F | Resp 18 | Ht 68.0 in | Wt 185.3 lb

## 2015-10-28 DIAGNOSIS — E1140 Type 2 diabetes mellitus with diabetic neuropathy, unspecified: Secondary | ICD-10-CM

## 2015-10-28 DIAGNOSIS — R634 Abnormal weight loss: Secondary | ICD-10-CM | POA: Diagnosis not present

## 2015-10-28 DIAGNOSIS — Z794 Long term (current) use of insulin: Secondary | ICD-10-CM

## 2015-10-28 DIAGNOSIS — R110 Nausea: Secondary | ICD-10-CM

## 2015-10-28 DIAGNOSIS — C770 Secondary and unspecified malignant neoplasm of lymph nodes of head, face and neck: Secondary | ICD-10-CM

## 2015-10-28 DIAGNOSIS — Z510 Encounter for antineoplastic radiation therapy: Secondary | ICD-10-CM | POA: Diagnosis not present

## 2015-10-28 DIAGNOSIS — C029 Malignant neoplasm of tongue, unspecified: Secondary | ICD-10-CM

## 2015-10-28 DIAGNOSIS — C01 Malignant neoplasm of base of tongue: Secondary | ICD-10-CM | POA: Diagnosis present

## 2015-10-28 DIAGNOSIS — K5900 Constipation, unspecified: Secondary | ICD-10-CM | POA: Insufficient documentation

## 2015-10-28 DIAGNOSIS — Z23 Encounter for immunization: Secondary | ICD-10-CM | POA: Diagnosis not present

## 2015-10-28 MED ORDER — PROCHLORPERAZINE MALEATE 10 MG PO TABS: 10.0000 mg | ORAL_TABLET | Freq: Four times a day (QID) | ORAL | 3 refills | Status: DC | PRN

## 2015-10-28 MED ORDER — INFLUENZA VAC SPLIT QUAD 0.5 ML IM SUSY
0.5000 mL | PREFILLED_SYRINGE | Freq: Once | INTRAMUSCULAR | Status: AC
  Administered 2015-10-28: 0.5 mL via INTRAMUSCULAR
  Filled 2015-10-28: qty 0.5

## 2015-10-28 MED ORDER — ONDANSETRON HCL 8 MG PO TABS: 8.0000 mg | ORAL_TABLET | Freq: Three times a day (TID) | ORAL | 3 refills | Status: DC | PRN

## 2015-10-28 MED FILL — FLUORISHIELD 1.1% GEL: 1.1 % | 30 days supply | Qty: 114 | Fill #1

## 2015-10-28 NOTE — Assessment & Plan Note (Signed)
He has significant constipation, multifactorial, likely due to poor oral intake, mild dehydration, or related to side effects of pain medication prescription. I reinforced the importance of taking laxatives regularly because constipation can exacerbate nausea.

## 2015-10-28 NOTE — Assessment & Plan Note (Signed)
The malignant lymph node had some ulcerated appearance The skin is not broken. It is regressing in size I recommend clean gauze application if it start to discharge

## 2015-10-28 NOTE — Assessment & Plan Note (Signed)
He has mild progressive weight loss due to dysgeusia. He  denies dysphagia. He will continue close follow-up with dietitian

## 2015-10-28 NOTE — Assessment & Plan Note (Addendum)
She has persistent nausea, likely multifactorial related to mucositis, side effects of his prescription pain medicine and constipation. I will prescribe Zofran and Compazine when necessary. We discussed expected side effects of each choice. If his nausea becomes uncontrolled, we can provide IV antiemetics or and scopolamine patch

## 2015-10-28 NOTE — Assessment & Plan Note (Signed)
He tolerated treatment well with expected side effects with mild mucositis, nausea, progressive weight loss and dysgeusia. I will see him back next week to provide supportive care

## 2015-10-28 NOTE — Progress Notes (Signed)
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 Biopsy recommended. No definite pharyngeal mass lesion identified by CT.       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 No other primary or metastatic cancer. 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 He is seen for further follow-up. He felt terrible. He has persistent dysgeusia causing poor oral intake. He lost almost 6 pounds in one week. He continues to have problem controlling his blood sugar. He has constant nausea but no vomiting. He is profoundly constipated, having a bowel movement only once or twice a week. His pain is better controlled with prescription fentanyl patch The size of the  lymph node on the right side of the neck is getting smaller. There were no abnormal discharge or bleeding  REVIEW OF SYSTEMS:   Constitutional: Denies fevers, chills or abnormal weight loss Eyes: Denies blurriness of vision Respiratory: Denies cough, dyspnea or wheezes Cardiovascular: Denies palpitation, chest discomfort or lower extremity swelling Skin: Denies abnormal skin rashes Neurological:Denies numbness, tingling or new weaknesses Behavioral/Psych: Mood is stable, no new changes  All other systems were reviewed with the patient and are negative.  I have reviewed the past medical history, past surgical history, social history and family history with the patient and they are unchanged from previous note.  ALLERGIES:  is allergic to no known allergies.  MEDICATIONS:  Current Outpatient Prescriptions  Medication Sig Dispense Refill  . ascorbic Acid (VITAMIN C) 500 MG CPCR Take 500 mg by mouth daily.    [name] aspirin 81 MG tablet Take 81 mg by mouth daily.     . clopidogrel (PLAVIX) 75 MG tablet Take 1 tablet (75 mg total) by mouth daily. 90 tablet 0  . fentaNYL (DURAGESIC - DOSED MCG/HR) 25 MCG/HR patch Place 1 patch (25 mcg total) onto the skin every 3 (three) days. 5 patch 0  . Ferrous Sulfate (IRON) 325 (65 Fe) MG TABS Take 325 mg by mouth.    . fluticasone (FLONASE) 50 MCG/ACT nasal spray Place 2 sprays into both nostrils daily.    . hydrochlorothiazide (HYDRODIURIL) 25 MG tablet Take 25 mg by mouth daily.      [name]  HYDROcodone-acetaminophen (HYCET) 7.5-325 mg/15 ml solution Take 10-69mL every 4 hours as needed for pain. 500 mL 0  . insulin aspart protamine- aspart (NOVOLOG MIX 70/30) (70-30) 100 UNIT/ML injection Inject 10-20 Units into the skin daily with supper. 1 unit before large meal. Per sliding scale    . insulin glargine (LANTUS) 100 UNIT/ML injection Inject 100 Units into the skin daily.     [name] lidocaine (XYLOCAINE) 2 % solution Patient: Mix 1part 2% viscous lidocaine, 1part  H20. Swish and swallow 42mL of this mixture, 51min before meals and at bedtime, up to QID 100 mL 5  . simvastatin (ZOCOR) 40 MG tablet Take 40 mg by mouth daily.   3  . sodium fluoride (FLUORISHIELD) 1.1 % GEL dental gel Instill one drop of gel per tooth space of fluoride tray. Place over teeth for 5 minutes. Remove. Spit out excess. Repeat nightly. 120 mL 11  . sucralfate (CARAFATE) 1 g tablet Dissolve 1 tablet in 10 mL H20 and swallow up to QID for sore throat. 48 tablet 5  . UNABLE TO FIND Place in ear(s) daily as needed. Med Name: Fluocinolone.  1 drop  Ear drops as needed for ears itching    . Diphenhyd-Hydrocort-Nystatin (FIRST-DUKES MOUTHWASH) SUSP Use as directed 10 mLs in the mouth or throat daily as needed. For Ulcer pain.    [name] ondansetron (ZOFRAN) 8 MG tablet Take 1 tablet (8 mg total) by mouth every 8 (eight) hours as needed for nausea. 60 tablet 3  . prochlorperazine (COMPAZINE) 10 MG tablet Take 1 tablet (10 mg total) by mouth every 6 (six) hours as needed. 60 tablet 3   No current facility-administered medications for this visit.     PHYSICAL EXAMINATION: ECOG PERFORMANCE STATUS: 1 - Symptomatic but completely ambulatory  Vitals:   10/28/15 1121  BP: (!) 162/71  Pulse: 73  Resp: 18  Temp: 98.4 F (36.9 C)   Filed Weights   10/28/15 1121  Weight: 185 lb 4.8 oz (84.1 kg)    GENERAL:alert, no distress and comfortable SKIN: skin color, texture, turgor are normal, no rashes or significant lesions EYES: normal, Conjunctiva are pink and non-injected, sclera clear OROPHARYNX:Noted mucositis. No thrush NECK: supple, thyroid normal size, non-tender, without nodularity LYMPH:  The malignant lymph node at the base of the neck is getting smaller LUNGS: clear to auscultation and percussion with normal breathing effort HEART: regular rate & rhythm and no murmurs and no lower extremity edema ABDOMEN:abdomen soft, non-tender and normal bowel sounds Musculoskeletal:no cyanosis of  digits and no clubbing  NEURO: alert & oriented x 3 with fluent speech, no focal motor/sensory deficits  LABORATORY DATA:  I have reviewed the data as listed    Component Value Date/Time   NA 137 09/15/2015 1345   K 3.7 09/15/2015 1345   CL 102 09/15/2015 1345   CO2 25 09/15/2015 1345   GLUCOSE 230 (H) 09/15/2015 1345   BUN 14 09/15/2015 1345   CREATININE 0.88 09/15/2015 1345   CREATININE 1.28 (H) 12/06/2014 0950   CALCIUM 10.1 09/15/2015 1345   GFRNONAA >60 09/15/2015 1345   GFRAA >60 09/15/2015 1345    No results found for: SPEP, UPEP  Lab Results  Component Value Date   WBC 11.1 (H) 09/15/2015   NEUTROABS 7.8 (H) 11/07/2014   HGB 15.8 09/15/2015   HCT 46.5 09/15/2015   MCV 85.0 09/15/2015   PLT 162 09/15/2015      Chemistry      Component Value Date/Time  NA 137 09/15/2015 1345   K 3.7 09/15/2015 1345   CL 102 09/15/2015 1345   CO2 25 09/15/2015 1345   BUN 14 09/15/2015 1345   CREATININE 0.88 09/15/2015 1345   CREATININE 1.28 (H) 12/06/2014 0950      Component Value Date/Time   CALCIUM 10.1 09/15/2015 1345      ASSESSMENT & PLAN:  Tongue cancer ([place]) He tolerated treatment well with expected side effects with mild mucositis, nausea, progressive weight loss and dysgeusia. 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 It is regressing in size I recommend clean gauze application if it start to discharge  Diabetes mellitus with diabetic neuropathy, with long-term current use of insulin ([place]) He has wide fluctuation of his blood sugar control. He will continue his current prescription insulin regimen as prescribed by his doctor  Weight loss He has mild progressive weight loss due to dysgeusia. He  denies dysphagia. He will continue close follow-up with dietitian   Nausea without vomiting She has persistent nausea,  likely multifactorial related to mucositis, side effects of his prescription pain medicine and constipation. I will prescribe Zofran and Compazine when necessary. We discussed expected side effects of each choice. If his nausea becomes uncontrolled, we can provide IV antiemetics or and scopolamine patch  Constipation, acute He has significant constipation, multifactorial, likely due to poor oral intake, mild dehydration, or related to side effects of pain medication prescription. I reinforced the importance of taking laxatives regularly because constipation can exacerbate nausea.   No orders of the defined types were placed in this encounter.  All questions were answered. The patient knows to call the clinic with any problems, questions or concerns. No barriers to learning was detected. I spent 20 minutes counseling the patient face to face. The total time spent in the appointment was 30 minutes and more than 50% was on counseling and review of test results     [facility], [place], MD 10/28/2015 12:48 PM

## 2015-10-28 NOTE — Assessment & Plan Note (Signed)
He has wide fluctuation of his blood sugar control. He will continue his current prescription insulin regimen as prescribed by his doctor

## 2015-10-29 ENCOUNTER — Ambulatory Visit
Admission: RE | Admit: 2015-10-29 | Discharge: 2015-10-29 | Disposition: A | Discharge status: Home / Self Care | Payer: Medicare Other | Source: Ambulatory Visit | Attending: Radiation Oncology | Admitting: Radiation Oncology

## 2015-10-29 DIAGNOSIS — Z510 Encounter for antineoplastic radiation therapy: Secondary | ICD-10-CM | POA: Diagnosis not present

## 2015-10-30 ENCOUNTER — Ambulatory Visit
Admission: RE | Admit: 2015-10-30 | Discharge: 2015-10-30 | Disposition: A | Discharge status: Home / Self Care | Payer: Medicare Other | Source: Ambulatory Visit | Attending: Radiation Oncology | Admitting: Radiation Oncology

## 2015-10-30 ENCOUNTER — Encounter: Payer: Self-pay | Admitting: Cardiology

## 2015-10-30 ENCOUNTER — Ambulatory Visit (INDEPENDENT_AMBULATORY_CARE_PROVIDER_SITE_OTHER): Payer: Medicare Other | Admitting: Cardiology

## 2015-10-30 VITALS — BP 132/74 | HR 69 | Ht 68.0 in | Wt 183.4 lb

## 2015-10-30 DIAGNOSIS — Z9861 Coronary angioplasty status: Secondary | ICD-10-CM | POA: Diagnosis not present

## 2015-10-30 DIAGNOSIS — I10 Essential (primary) hypertension: Secondary | ICD-10-CM | POA: Diagnosis not present

## 2015-10-30 DIAGNOSIS — Z510 Encounter for antineoplastic radiation therapy: Secondary | ICD-10-CM | POA: Diagnosis not present

## 2015-10-30 DIAGNOSIS — I2510 Atherosclerotic heart disease of native coronary artery without angina pectoris: Secondary | ICD-10-CM

## 2015-10-30 DIAGNOSIS — E785 Hyperlipidemia, unspecified: Secondary | ICD-10-CM | POA: Diagnosis not present

## 2015-10-30 NOTE — Progress Notes (Signed)
Cardiology Office Note   Date:  10/30/2015   ID:  Kalu, Shami 1945/06/20, MRN [id number]  PCP:  [name], MD  Cardiologist:  Dr. Ethanjames Fontenot [name]     Chief Complaint  Patient presents with  . Coronary Artery Disease      History of Present Illness: Benjamin Barr is a 70 y.o. male who presents for follow up CAD.  He has w/ PMH of CAD w/ Taxus DES to LCx & RCA in 2008, rheumatoid arthritis (& new Dx of PMR), type II DM, prostate CA.He presented in October 2016 with progressive angina.  He had stenting of the ostial LCx, with DES, stenting of mRCA with DES and POBA of the PDA. The prior stents were still patent.   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 He is receiving RT. He states his taste buds are shot and his appetite is poor. He has lost 22 lbs. Blood sugars have improved and insulin dose reduced. HCTZ held to avoid dehydration. He is doing well from a cardiac standpoint without chest pain or SOB.   Studies Reviewed:   Echo April 2016: Mild LVH. EF 60-65%, no RWMA, Gr 1 DD, Mod Ao Sclerosis - no stenosis. Mild AI.   Past Medical History:  Diagnosis Date  . Anemia   . CAD (coronary artery disease)    a. 2008 PCI: Taxus DES  to mRCA, prox & mid LCFX;  b. 10/2014 MV: extensive ischemia in the RCA territory and small area of anterior ischemia;  c. 11/2014 PCI: LM nl, LAD 30ost/prox, 80d, LCX 90ost (3.0x12 Promus Prem DES), OM2 30, RCA 72m (3.0x12 Promus Prem DES), 36m, RPDA 90 (PTCA), EF 55-65%.  . Cancer ([place])    Head & neck ca  . Diverticulosis of colon   . Heart murmur   . History of colon polyps    2014--  hyperplastic, adenoma, and benign polyps  . Hyperlipidemia   . Hypertension   . Myocardial infarction ([place])   . Nonproliferative diabetic retinopathy ([place])   . Obesity   . Peripheral neuropathy ([place])   . Pneumonia    hx  . Prostate cancer ([place])   .  RBBB (right bundle branch block)   . Skin cancer   . Type 2 diabetes mellitus ([place])     Past Surgical History:  Procedure Laterality Date  . APPENDECTOMY    . CARDIAC CATHETERIZATION N/A 11/13/2014   Procedure: Left Heart Cath and Coronary Angiography;  Surgeon: Dearia Wilmouth M [name], MD;  Location: [facility];  Service: Cardiovascular;  Laterality: N/A;  . CARDIAC CATHETERIZATION  11/13/2014   Procedure: Coronary Stent Intervention;  Surgeon: Sender Rueb M [name], MD;  Location: [place] [facility];  Service: Cardiovascular;;  . CARDIOVASCULAR STRESS TEST  01-17-2012  dr [name]   Normal perfusion study/ no ischemia or infarction/  normal LVF and wall motion, ef 54%  . CORONARY ANGIOPLASTY WITH STENT PLACEMENT  05/04/2006   dr [name]   Severe 2-vessel obstructive CAD/  normal LVF/  PCI with DES to proxima and mid CFX and mRCA (total 3 DES)  . CRYOABLATION N/A 04/01/2014   Procedure: CRYO ABLATION PROSTATE;  Surgeon: [name], MD;  Location: [facility];  Service: Urology;  Laterality: N/A;  . DIRECT LARYNGOSCOPY N/A 09/19/2015   Procedure: DIRECT LARYNGOSCOPY;  Surgeon: Izora Gala, MD;  Location: [facility];  Service: ENT;  Laterality: N/A;  . ESOPHAGOSCOPY N/A 09/19/2015   Procedure: ESOPHAGOSCOPY;  Surgeon: Izora Gala, MD;  Location: [facility];  Service: ENT;  Laterality: N/A;  . FLOOR OF MOUTH BIOPSY N/A 09/19/2015   Procedure: FLOOR OF MOUTH BIOPSY;  Surgeon: Izora Gala, MD;  Location: [facility];  Service: ENT;  Laterality: N/A;  . INSERTION OF SUPRAPUBIC CATHETER N/A 04/01/2014   Procedure: INSERTION OF SUPRAPUBIC CATHETER;  Surgeon: [name], MD;  Location: [facility];  Service: Urology;  Laterality: N/A;  suprapubic   . MULTIPLE TOOTH EXTRACTIONS  09/17/2015  . PARTIAL COLECTOMY  80's     Current Outpatient Prescriptions  Medication Sig Dispense Refill  . ascorbic Acid (VITAMIN C) 500 MG CPCR Take 500 mg by mouth daily.    [name] aspirin 81 MG  tablet Take 81 mg by mouth daily.     . clopidogrel (PLAVIX) 75 MG tablet Take 1 tablet (75 mg total) by mouth daily. 90 tablet 0  . Diphenhyd-Hydrocort-Nystatin (FIRST-DUKES MOUTHWASH) SUSP Use as directed 10 mLs in the mouth or throat daily as needed. For Ulcer pain.    . fentaNYL (DURAGESIC - DOSED MCG/HR) 25 MCG/HR patch Place 1 patch (25 mcg total) onto the skin every 3 (three) days. 5 patch 0  . Ferrous Sulfate (IRON) 325 (65 Fe) MG TABS Take 325 mg by mouth.    . fluticasone (FLONASE) 50 MCG/ACT nasal spray Place 2 sprays into both nostrils daily.    . hydrochlorothiazide (HYDRODIURIL) 25 MG tablet Take 25 mg by mouth daily.      [name] HYDROcodone-acetaminophen (HYCET) 7.5-325 mg/15 ml solution Take 10-64mL every 4 hours as needed for pain. 500 mL 0  . insulin aspart protamine- aspart (NOVOLOG MIX 70/30) (70-30) 100 UNIT/ML injection Inject 10-20 Units into the skin daily with supper. 1 unit before large meal. Per sliding scale    . insulin glargine (LANTUS) 100 UNIT/ML injection Inject 100 Units into the skin daily.     [name] lidocaine (XYLOCAINE) 2 % solution Patient: Mix 1part 2% viscous lidocaine, 1part H20. Swish and swallow 92mL of this mixture, 13min before meals and at bedtime, up to QID 100 mL 5  . ondansetron (ZOFRAN) 8 MG tablet Take 1 tablet (8 mg total) by mouth every 8 (eight) hours as needed for nausea. 60 tablet 3  . prochlorperazine (COMPAZINE) 10 MG tablet Take 1 tablet (10 mg total) by mouth every 6 (six) hours as needed. 60 tablet 3  . simvastatin (ZOCOR) 40 MG tablet Take 40 mg by mouth daily.   3  . sodium fluoride (FLUORISHIELD) 1.1 % GEL dental gel Instill one drop of gel per tooth space of fluoride tray. Place over teeth for 5 minutes. Remove. Spit out excess. Repeat nightly. 120 mL 11  . sucralfate (CARAFATE) 1 g tablet Dissolve 1 tablet in 10 mL H20 and swallow up to QID for sore throat. 48 tablet 5  . UNABLE TO FIND Place in ear(s) daily as needed. Med Name: Fluocinolone.   1 drop  Ear drops as needed for ears itching     No current facility-administered medications for this visit.     Allergies:   No known allergies    Social History:  The patient  reports that he has never smoked. He has never used smokeless tobacco. He reports that he drinks alcohol. He reports that he does not use drugs.   Family History:  The patient's family history includes Cancer in his father; Diverticulitis in his  mother; Heart failure in his father.    ROS:  As noted in HPI. All other systems are reviewed and are negative.  Wt Readings from Last 3 Encounters:  10/30/15 183 lb 6.4 oz (83.2 kg)  10/28/15 185 lb 4.8 oz (84.1 kg)  10/27/15 186 lb 14.4 oz (84.8 kg)     PHYSICAL EXAM: VS:  BP 132/74   Pulse 69   Ht 5\' 8"  (1.727 m)   Wt 183 lb 6.4 oz (83.2 kg)   BMI 27.89 kg/m  , BMI Body mass index is 27.89 kg/m. General:Pleasant affect, NAD Skin:Warm and dry, brisk capillary refill HEENT:normocephalic, sclera clear, mucus membranes moist Neck:supple, no JVD, no bruits  Heart:S1S2 RRR without murmur, gallup, rub or click Lungs:clear without rales, rhonchi, or wheezes [id number], non tender, + BS, do not palpate liver spleen or masses Ext:no lower ext edema, 2+ pedal pulses, 2+ radial pulses Neuro:alert and oriented, MAE, follows commands, + facial symmetry    EKG:  EKG  Today shows NSR with rate 76. Old RBBB. No acute change. I have personally reviewed and interpreted this study.   Recent Labs: 09/15/2015: BUN 14; Creatinine, Ser 0.88; Hemoglobin 15.8; Platelets 162; Potassium 3.7; Sodium 137  Glucose 230, A1c 9.7%   Lipid Panel No results found for: CHOL, TRIG, HDL, CHOLHDL, VLDL, LDLCALC, LDLDIRECT   Last lipid panel 07/26/14: cholesterol 146, triglycerides 398, LDL 34, HDL 32.   Other studies Reviewed: Additional studies/ records that were reviewed today include:   Cardiac cath October 2016: Procedures    Coronary Stent Intervention   Left Heart Cath and  Coronary Angiography    Conclusion     Ost LAD to Prox LAD lesion, 30% stenosed.  Dist LAD lesion, 80% stenosed.  2nd Mrg lesion, 30% stenosed.  Ost Cx lesion, 90% stenosed.  The left ventricular systolic function is normal.  Ost Cx to Prox Cx lesion, 90% stenosed. This is proximal the the prior stent. There is a 0% residual stenosis post intervention.  RPDA lesion, 90% stenosed. There is a 10% residual stenosis post intervention.  Mid RCA-1 lesion, 90% stenosed. There is a 0% residual stenosis post intervention. A drug-eluting stent was placed. This is proximal to the prior stent.  Mid RCA-1 lesion, 90% stenosed.  A drug-eluting stent was placed.  A drug-eluting stent was placed.  1. Severe 2 vessel obstructive CAD  90% ostial LCx. Prior stent in the proximal to mid LCx is patent.  90% mid RCA proximal to prior stent. The old stent is patent  90% PDA 2. Normal LV function 3. Successful stenting of the ostial LCx with a DES 4. Successful stenting of the mid RCA with a DES  5. Successful POBA of the PDA.  Plan: continue DAPT indefinitely. Anticipate DC in am.       ASSESSMENT AND PLAN:  1.  CAD  s/p DES stents to ostial LCX, and mRCA and angioplasty to PDA in October 2016.  Continue DAPT. He continues on ASA and Plavix.  Continue  risk factor modification.  2.   Hyperlipidemia on statin followed by PCP.   3.  Essential HTN  BP is elevated. On no antihypertensive therapy now due to poor po intake. If BP increases further would consider amlodipine.  4. DM followed by Dr. Buddy Duty  5. PMR followed by PCP- not currently on steroids.   6. SSCA of the tongue- metastatic. Currently getting RT.    Current medicines are reviewed with the patient today.  The patient  Has no concerns regarding medicines.  The following changes have been made:  See above Labs/ tests ordered today include:see above  Disposition:   FU: 6 months.  Signed, Darlinda Bellows [name], MD    10/30/2015 3:15 PM    [facility] [address], [place] [place], [place] Phone: [phone]; Fax: [phone]

## 2015-10-30 NOTE — Patient Instructions (Signed)
Continue your current therapy  I will see you in 6 months.   

## 2015-10-31 ENCOUNTER — Ambulatory Visit
Admission: RE | Admit: 2015-10-31 | Discharge: 2015-10-31 | Disposition: A | Discharge status: Home / Self Care | Payer: Medicare Other | Source: Ambulatory Visit | Attending: Radiation Oncology | Admitting: Radiation Oncology

## 2015-10-31 DIAGNOSIS — Z510 Encounter for antineoplastic radiation therapy: Secondary | ICD-10-CM | POA: Diagnosis not present

## 2015-11-03 ENCOUNTER — Encounter: Payer: Self-pay | Admitting: Radiation Oncology

## 2015-11-03 ENCOUNTER — Ambulatory Visit
Admission: RE | Admit: 2015-11-03 | Discharge: 2015-11-03 | Disposition: A | Discharge status: Home / Self Care | Payer: Medicare Other | Source: Ambulatory Visit | Attending: Radiation Oncology | Admitting: Radiation Oncology

## 2015-11-03 ENCOUNTER — Ambulatory Visit: Payer: Medicare Other | Admitting: Nutrition

## 2015-11-03 ENCOUNTER — Telehealth: Payer: Self-pay | Admitting: Hematology and Oncology

## 2015-11-03 DIAGNOSIS — C01 Malignant neoplasm of base of tongue: Secondary | ICD-10-CM

## 2015-11-03 DIAGNOSIS — Z510 Encounter for antineoplastic radiation therapy: Secondary | ICD-10-CM | POA: Diagnosis not present

## 2015-11-03 LAB — BASIC METABOLIC PANEL
ANION GAP: 11 meq/L (ref 3–11)
BUN: 17.4 mg/dL (ref 7.0–26.0)
CHLORIDE: 104 meq/L (ref 98–109)
CO2: 29 mEq/L (ref 22–29)
Calcium: 10 mg/dL (ref 8.4–10.4)
Creatinine: 1.1 mg/dL (ref 0.7–1.3)
EGFR: 64 mL/min/{1.73_m2} — ABNORMAL LOW (ref >90)
GLUCOSE: 243 mg/dL — AB (ref 70–140)
POTASSIUM: 3.9 meq/L (ref 3.5–5.1)
Sodium: 144 mEq/L (ref 136–145)

## 2015-11-03 MED ORDER — FENTANYL 25 MCG/HR TD PT72: 25.0000 ug | MEDICATED_PATCH | TRANSDERMAL | 0 refills | Status: DC

## 2015-11-03 MED ORDER — FLUCONAZOLE 100 MG PO TABS: ORAL_TABLET | ORAL | 0 refills | Status: DC

## 2015-11-03 NOTE — Progress Notes (Signed)
Nutrition follow-up completed with patient and wife patient is receiving radiation therapy for right neck squamous cell cancer. Weight declined and was documented as 182 pounds Monday, September 25. Patient reports he has tolerated the samples of boost very high-calorie I provided. (530 cal, 22 g protein, 160 mL free water per carton) He has increased supplement 3 times a day providing 1590 cal, 66 g protein, 480 mL water. Patient reports he can drink approximately one bottle of water daily.  Estimated nutrition needs: 2000-2200 calories, 110-125 grams protein, greater than 2.2 L fluid.  Nutrition diagnosis:  Inadequate oral intake continues.  Intervention:  Educated patient to increase boost very high-calorie, 4 times a day to provide 2120 cal, 88 g protein, 640 cc free water.  I provided additional samples and patient has ordering information. Patient would require an additional 1560 mL free water. Provided support and encouragement for patient to increase overall calorie intake.  Provided suggestions on how patient can increase free water intake. Recommended patient and 1 package of [place] protein powder daily providing an additional 100 cal and 20 g of protein. Labs are pending.  Monitoring, evaluation, goals:  Patient will minimize further weight loss by increasing oral intake and fluid intake to meet greater than 90% of estimated nutrition needs.  Next visit: Tuesday, October 3 after radiation therapy.  **Disclaimer: This note was dictated with voice recognition software. Similar sounding words can inadvertently be transcribed and this note may contain transcription errors which may not have been corrected upon publication of note.**

## 2015-11-03 NOTE — Progress Notes (Signed)
   Weekly Management Note:  Outpatient    ICD-9-CM ICD-10-CM   1. Cancer of base of tongue (HCC) 141.0 C01 fentaNYL (DURAGESIC - DOSED MCG/HR) 25 MCG/HR patch     fluconazole (DIFLUCAN) 100 MG tablet     Basic metabolic panel    Current Dose:  44 Gy  Projected Dose: 70 Gy   Narrative:  The patient presents for routine under treatment assessment.  CBCT/MVCT images/Port film x-rays were reviewed.  The chart was checked. Mr. Neidhart presents for his 22nd fraction of radiation to his Oropharynx and bilateral neck. He reports pain a 4/10 in his throat when swallowing. He is using a 25 mcg Fentanyl patch and Hycet about once daily. He tells me he needs a refill for his Fentanyl patch today. His upper back and anterior neck have peeling present. The right side of his neck has some areas that are red, with peeling noted. He is using sonafine cream twice daily. He is drinking 3 Boost daily. He is only drinking 16 ounces of water daily per his report. He is not eating much orally. He is not able to taste food well, which makes it difficult to eat or drink.   Temp 98.2 F (36.8 C)   Ht 5\' 8"  (1.727 m)   Wt 182 lb (82.6 kg)   SpO2 95% Comment: room air  BMI 27.67 kg/m    Orthostatics: BP sitting 146/72 pulse 82. BP standing 120/91 pulse 94.   Physical Findings:  height is 5\' 8"  (1.727 m) and weight is 182 lb (82.6 kg). His temperature is 98.2 F (36.8 C). His oxygen saturation is 95%.   Wt Readings from Last 3 Encounters:  11/03/15 182 lb (82.6 kg)  10/30/15 183 lb 6.4 oz (83.2 kg)  10/28/15 185 lb 4.8 oz (84.1 kg)  Patchy mucositis with thrush over his palette . Skin over his neck is erythematous. Protuberant nodule in right lower neck is regressing  CMP     Component Value Date/Time   NA 144 11/03/2015 1221   K 3.9 11/03/2015 1221   CL 102 09/15/2015 1345   CO2 29 11/03/2015 1221   GLUCOSE 243 (H) 11/03/2015 1221   BUN 17.4 11/03/2015 1221   CREATININE 1.1 11/03/2015 1221   CALCIUM 10.0  11/03/2015 1221   GFRNONAA >60 09/15/2015 1345   GFRAA >60 09/15/2015 1345    Impression:  The patient is tolerating radiotherapy.  Plan:  Continue radiotherapy as planned. Spent a lengthy amount of time discussing importance of PO intake; otherwise, PEG will be needed.    BMP today is satisfactory. Will not need IVF.  Nutritionist to see him today.  Fluconazole for thrush  Fentanyl refilled. ________________________________   Eppie Gibson, M.D.  This document serves as a record of services personally performed by Eppie Gibson, MD. It was created on her behalf by Bethann Humble, a trained medical scribe. The creation of this record is based on the scribe's personal observations and the provider's statements to them. This document has been checked and approved by the attending provider.

## 2015-11-03 NOTE — Progress Notes (Signed)
Mr. Spiering presents for his 22nd fraction of radiation to his Oropharynx and bilateral neck. He reports pain a 4/10 in his throat when swallowing. He is using a 25 mcg Fentanyl patch and Hycet about once daily. He tells me he needs a refill for his Fentanyl patch today. His upper back and anterior neck have peeling present. The right side of his neck has some areas that are red, with peeling noted. He is using sonafine cream twice daily. He is drinking 3 Boost daily. He is only drinking 16 ounces of water daily per his report. He is not eating much orally. He is not able to taste food well, which makes it difficult to eat or drink.   Temp 98.2 F (36.8 C)   Ht 5\' 8"  (1.727 m)   Wt 182 lb (82.6 kg)   SpO2 95% Comment: room air  BMI 27.67 kg/m    Orthostatics: BP sitting 146/72 pulse 82. BP standing 120/91 pulse 94.  Wt Readings from Last 3 Encounters:  11/03/15 182 lb (82.6 kg)  10/30/15 183 lb 6.4 oz (83.2 kg)  10/28/15 185 lb 4.8 oz (84.1 kg)

## 2015-11-03 NOTE — Telephone Encounter (Signed)
Spoke with patient confirming 9/26 appointment. Patient will get new schedule.

## 2015-11-04 ENCOUNTER — Ambulatory Visit (HOSPITAL_BASED_OUTPATIENT_CLINIC_OR_DEPARTMENT_OTHER): Payer: Medicare Other | Admitting: Hematology and Oncology

## 2015-11-04 ENCOUNTER — Encounter: Payer: Self-pay | Admitting: Hematology and Oncology

## 2015-11-04 ENCOUNTER — Ambulatory Visit
Admission: RE | Admit: 2015-11-04 | Discharge: 2015-11-04 | Disposition: A | Discharge status: Home / Self Care | Payer: Medicare Other | Source: Ambulatory Visit | Attending: Radiation Oncology | Admitting: Radiation Oncology

## 2015-11-04 ENCOUNTER — Telehealth: Payer: Self-pay | Admitting: Hematology and Oncology

## 2015-11-04 DIAGNOSIS — C01 Malignant neoplasm of base of tongue: Secondary | ICD-10-CM | POA: Diagnosis present

## 2015-11-04 DIAGNOSIS — K5900 Constipation, unspecified: Secondary | ICD-10-CM | POA: Diagnosis not present

## 2015-11-04 DIAGNOSIS — K1233 Oral mucositis (ulcerative) due to radiation: Secondary | ICD-10-CM | POA: Diagnosis not present

## 2015-11-04 DIAGNOSIS — R110 Nausea: Secondary | ICD-10-CM

## 2015-11-04 DIAGNOSIS — C770 Secondary and unspecified malignant neoplasm of lymph nodes of head, face and neck: Secondary | ICD-10-CM | POA: Diagnosis not present

## 2015-11-04 DIAGNOSIS — Z510 Encounter for antineoplastic radiation therapy: Secondary | ICD-10-CM | POA: Diagnosis not present

## 2015-11-04 NOTE — Assessment & Plan Note (Signed)
The malignant lymph node had some ulcerated appearance The skin is not broken. It is regressing in size I recommend clean gauze application if it start to discharge

## 2015-11-04 NOTE — Telephone Encounter (Signed)
S/w pt, gave appt 10/5 @ 11.45am per los.

## 2015-11-04 NOTE — Assessment & Plan Note (Signed)
She has persistent nausea, likely multifactorial related to mucositis, side effects of his prescription pain medicine and constipation. I will prescribe Zofran and Compazine when necessary. We discussed expected side effects of each choice. If his nausea becomes uncontrolled, we can provide IV antiemetics or and scopolamine patch

## 2015-11-04 NOTE — Assessment & Plan Note (Signed)
He has intermittent significant constipation, multifactorial, likely due to poor oral intake, mild dehydration, or related to side effects of pain medication prescription. I reinforced the importance of taking laxatives regularly because constipation can exacerbate nausea.

## 2015-11-04 NOTE — Assessment & Plan Note (Signed)
He has started to experience mucositis from radiation treatment. He was prescribed local medication with Carafate, fentanyl, hycet and lidocaine by his radiation oncologist. I recommend he continues the same

## 2015-11-04 NOTE — Assessment & Plan Note (Signed)
He tolerated treatment well with expected side effects with mild mucositis, nausea, progressive weight loss and dysgeusia. I will see him back next week to provide supportive care

## 2015-11-04 NOTE — Progress Notes (Signed)
[place] OFFICE PROGRESS NOTE  Patient Care Team: Aura Dials, MD as PCP - General (Family Medicine) Beverly Gust, MD as Referring Physician (Otolaryngology) Leota Sauers, RN as Oncology Nurse Navigator Heath Lark, MD as Consulting Physician (Hematology and Oncology) Carolan Clines, MD as Consulting Physician (Urology) Delrae Rend, MD as Consulting Physician (Endocrinology) [name] [name], MD as Consulting Physician (Cardiology) Crista Luria, MD as Consulting Physician (Dermatology)  SUMMARY OF ONCOLOGIC HISTORY:   Tongue cancer [facility])   08/20/2015 Miscellaneous    He was evaluated by ENT      08/22/2015 Imaging    CT neck showed malignant adenopathy right supraclavicular region compatible with metastatic disease. Biopsy recommended. No definite pharyngeal mass lesion identified by CT.       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 No other primary or metastatic cancer. 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 He is seen for further follow-up. He has persistent dysgeusia causing poor oral intake. He has constant nausea but no vomiting. He felt that nausea medicine is helping. He continues to have some constipation His pain is better controlled with prescription fentanyl patch The size of the lymph node on the right side of the neck is getting smaller. There were no abnormal discharge or bleeding  REVIEW OF  SYSTEMS:   Constitutional: Denies fevers, chills  Eyes: Denies blurriness of vision Respiratory: Denies cough, dyspnea or wheezes Cardiovascular: Denies palpitation, chest discomfort or lower extremity swelling Skin: Denies abnormal skin rashes Lymphatics: Denies new lymphadenopathy or easy bruising Neurological:Denies numbness, tingling or new weaknesses Behavioral/Psych: Mood is stable, no new changes  All other systems were reviewed with the patient and are negative.  I have reviewed the past medical history, past surgical history, social history and family history with the patient and they are unchanged from previous note.  ALLERGIES:  is allergic to no known allergies.  MEDICATIONS:  Current Outpatient Prescriptions  Medication Sig Dispense Refill  . clopidogrel (PLAVIX) 75 MG tablet Take 1 tablet (75 mg total) by mouth daily. 90 tablet 0  . fentaNYL (DURAGESIC - DOSED MCG/HR) 25 MCG/HR patch Place 1 patch (25 mcg total) onto the skin every 3 (three) days. 5 patch 0  . fluticasone (FLONASE) 50 MCG/ACT nasal spray Place 2 sprays into both nostrils daily.    [name] HYDROcodone-acetaminophen (HYCET) 7.5-325 mg/15 ml solution Take 10-63mL every 4 hours as needed for pain. 500 mL 0  . insulin aspart protamine- aspart (NOVOLOG MIX 70/30) (70-30) 100 UNIT/ML injection Inject 10-20 Units into the skin daily with supper. 1 unit before large meal. Per sliding scale    . insulin glargine (LANTUS) 100 UNIT/ML injection Inject 100 Units into the skin daily.     [name] lidocaine (XYLOCAINE) 2 % solution Patient: Mix 1part 2%  viscous lidocaine, 1part H20. Swish and swallow 49mL of this mixture, 45min before meals and at bedtime, up to QID 100 mL 5  . ondansetron (ZOFRAN) 8 MG tablet Take 1 tablet (8 mg total) by mouth every 8 (eight) hours as needed for nausea. 60 tablet 3  . prochlorperazine (COMPAZINE) 10 MG tablet Take 1 tablet (10 mg total) by mouth every 6 (six) hours as needed. 60 tablet 3  . sodium  fluoride (FLUORISHIELD) 1.1 % GEL dental gel Instill one drop of gel per tooth space of fluoride tray. Place over teeth for 5 minutes. Remove. Spit out excess. Repeat nightly. 120 mL 11  . sucralfate (CARAFATE) 1 g tablet Dissolve 1 tablet in 10 mL H20 and swallow up to QID for sore throat. 48 tablet 5   No current facility-administered medications for this visit.     PHYSICAL EXAMINATION: ECOG PERFORMANCE STATUS: 1 - Symptomatic but completely ambulatory  Vitals:   11/04/15 1115  BP: (!) 145/64  Pulse: 78  Resp: 18  Temp: 97.9 F (36.6 C)   Filed Weights   11/04/15 1115  Weight: 182 lb (82.6 kg)    GENERAL:alert, no distress and comfortable SKIN: Noted dermatitis/erythema around his neck from radiation treatment EYES: normal, Conjunctiva are pink and non-injected, sclera clear OROPHARYNX: Noted mucositis in oropharynx. No thrush NECK: supple, thyroid normal size, non-tender, without nodularity LYMPH:  The lymph node on the base of the right neck has regressed in size LUNGS: clear to auscultation and percussion with normal breathing effort HEART: regular rate & rhythm and no murmurs and no lower extremity edema ABDOMEN:abdomen soft, non-tender and normal bowel sounds Musculoskeletal:no cyanosis of digits and no clubbing  NEURO: alert & oriented x 3 with fluent speech, no focal motor/sensory deficits  LABORATORY DATA:  I have reviewed the data as listed    Component Value Date/Time   NA 144 11/03/2015 1221   K 3.9 11/03/2015 1221   CL 102 09/15/2015 1345   CO2 29 11/03/2015 1221   GLUCOSE 243 (H) 11/03/2015 1221   BUN 17.4 11/03/2015 1221   CREATININE 1.1 11/03/2015 1221   CALCIUM 10.0 11/03/2015 1221   GFRNONAA >60 09/15/2015 1345   GFRAA >60 09/15/2015 1345    No results found for: SPEP, UPEP  Lab Results  Component Value Date   WBC 11.1 (H) 09/15/2015   NEUTROABS 7.8 (H) 11/07/2014   HGB 15.8 09/15/2015   HCT 46.5 09/15/2015   MCV 85.0 09/15/2015   PLT 162  09/15/2015      Chemistry      Component Value Date/Time   NA 144 11/03/2015 1221   K 3.9 11/03/2015 1221   CL 102 09/15/2015 1345   CO2 29 11/03/2015 1221   BUN 17.4 11/03/2015 1221   CREATININE 1.1 11/03/2015 1221      Component Value Date/Time   CALCIUM 10.0 11/03/2015 1221     ASSESSMENT & PLAN:  Cancer of base of tongue ([place]) He tolerated treatment well with expected side effects with mild mucositis, nausea, progressive weight loss and dysgeusia. I will see him back next week to provide supportive care  Nausea without vomiting She has persistent nausea, likely multifactorial related to mucositis, side effects of his prescription pain medicine and constipation. I will prescribe Zofran and Compazine when necessary. We discussed expected side effects of each choice. 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 He was prescribed local medication with Carafate, fentanyl, hycet and lidocaine by his radiation oncologist. I recommend he continues the same  Constipation, acute He has intermittent significant constipation, multifactorial, likely due to poor oral intake, mild dehydration, or related to side effects of pain medication prescription. I reinforced the importance of taking laxatives regularly because constipation can exacerbate nausea.  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 It is regressing in size I recommend clean gauze application if it start to discharge   No orders of the defined types were placed in this encounter.  All questions were answered. The patient knows to call the clinic with any problems, questions or concerns. No barriers to learning was detected. I spent 15 minutes counseling the patient face to face. The total time  spent in the appointment was 20 minutes and more than 50% was on counseling and review of test results     Heath Lark, MD 11/04/2015 12:08 PM

## 2015-11-05 ENCOUNTER — Ambulatory Visit
Admission: RE | Admit: 2015-11-05 | Discharge: 2015-11-05 | Disposition: A | Discharge status: Home / Self Care | Payer: Medicare Other | Source: Ambulatory Visit | Attending: Radiation Oncology | Admitting: Radiation Oncology

## 2015-11-05 DIAGNOSIS — Z510 Encounter for antineoplastic radiation therapy: Secondary | ICD-10-CM | POA: Diagnosis not present

## 2015-11-06 ENCOUNTER — Ambulatory Visit
Admission: RE | Admit: 2015-11-06 | Discharge: 2015-11-06 | Disposition: A | Discharge status: Home / Self Care | Payer: Medicare Other | Source: Ambulatory Visit | Attending: Radiation Oncology | Admitting: Radiation Oncology

## 2015-11-06 ENCOUNTER — Encounter: Payer: Self-pay | Admitting: Nutrition

## 2015-11-06 DIAGNOSIS — Z510 Encounter for antineoplastic radiation therapy: Secondary | ICD-10-CM | POA: Diagnosis not present

## 2015-11-07 ENCOUNTER — Ambulatory Visit
Admission: RE | Admit: 2015-11-07 | Discharge: 2015-11-07 | Disposition: A | Discharge status: Home / Self Care | Payer: Medicare Other | Source: Ambulatory Visit | Attending: Radiation Oncology | Admitting: Radiation Oncology

## 2015-11-07 DIAGNOSIS — Z510 Encounter for antineoplastic radiation therapy: Secondary | ICD-10-CM | POA: Diagnosis not present

## 2015-11-10 ENCOUNTER — Ambulatory Visit
Admission: RE | Admit: 2015-11-10 | Discharge: 2015-11-10 | Disposition: A | Discharge status: Home / Self Care | Payer: Medicare Other | Source: Ambulatory Visit | Attending: Radiation Oncology | Admitting: Radiation Oncology

## 2015-11-10 ENCOUNTER — Ambulatory Visit (HOSPITAL_BASED_OUTPATIENT_CLINIC_OR_DEPARTMENT_OTHER): Payer: Medicare Other | Admitting: Nurse Practitioner

## 2015-11-10 ENCOUNTER — Encounter: Payer: Self-pay | Admitting: Hematology and Oncology

## 2015-11-10 ENCOUNTER — Encounter: Payer: Self-pay | Admitting: Nurse Practitioner

## 2015-11-10 ENCOUNTER — Telehealth: Payer: Self-pay | Admitting: *Deleted

## 2015-11-10 ENCOUNTER — Encounter: Payer: Self-pay | Admitting: Radiation Oncology

## 2015-11-10 ENCOUNTER — Ambulatory Visit (HOSPITAL_BASED_OUTPATIENT_CLINIC_OR_DEPARTMENT_OTHER): Payer: Medicare Other | Admitting: Hematology and Oncology

## 2015-11-10 VITALS — BP 131/81 | HR 76 | Temp 98.3°F | Resp 18 | Ht 68.0 in | Wt 177.8 lb

## 2015-11-10 VITALS — BP 157/76 | HR 81 | Temp 98.6°F | Resp 17 | Ht 68.0 in | Wt 178.2 lb

## 2015-11-10 DIAGNOSIS — C770 Secondary and unspecified malignant neoplasm of lymph nodes of head, face and neck: Secondary | ICD-10-CM | POA: Diagnosis present

## 2015-11-10 DIAGNOSIS — K5900 Constipation, unspecified: Secondary | ICD-10-CM

## 2015-11-10 DIAGNOSIS — C01 Malignant neoplasm of base of tongue: Secondary | ICD-10-CM

## 2015-11-10 DIAGNOSIS — K1233 Oral mucositis (ulcerative) due to radiation: Secondary | ICD-10-CM | POA: Diagnosis not present

## 2015-11-10 DIAGNOSIS — R5381 Other malaise: Secondary | ICD-10-CM | POA: Diagnosis not present

## 2015-11-10 DIAGNOSIS — R110 Nausea: Secondary | ICD-10-CM

## 2015-11-10 DIAGNOSIS — Z510 Encounter for antineoplastic radiation therapy: Secondary | ICD-10-CM | POA: Diagnosis present

## 2015-11-10 DIAGNOSIS — R634 Abnormal weight loss: Secondary | ICD-10-CM | POA: Diagnosis not present

## 2015-11-10 MED ORDER — SODIUM CHLORIDE 0.9 % IJ SOLN
10.0000 mL | INTRAMUSCULAR | Status: DC | PRN
  Filled 2015-11-10: qty 10

## 2015-11-10 MED ORDER — SODIUM CHLORIDE 0.9 % IV SOLN
Freq: Once | INTRAVENOUS | Status: AC
  Administered 2015-11-10: 13:00:00 via INTRAVENOUS
  Filled 2015-11-10: qty 4

## 2015-11-10 MED ORDER — HEPARIN SOD (PORK) LOCK FLUSH 100 UNIT/ML IV SOLN
500.0000 [IU] | Freq: Once | INTRAVENOUS | Status: DC | PRN
  Filled 2015-11-10: qty 5

## 2015-11-10 MED ORDER — HEPARIN SOD (PORK) LOCK FLUSH 100 UNIT/ML IV SOLN
250.0000 [IU] | Freq: Once | INTRAVENOUS | Status: DC | PRN
  Filled 2015-11-10: qty 5

## 2015-11-10 MED ORDER — SODIUM CHLORIDE 0.9 % IV SOLN
Freq: Once | INTRAVENOUS | Status: AC
  Administered 2015-11-10: 13:00:00 via INTRAVENOUS

## 2015-11-10 MED ORDER — ALTEPLASE 2 MG IJ SOLR
2.0000 mg | Freq: Once | INTRAMUSCULAR | Status: DC | PRN
  Filled 2015-11-10: qty 2

## 2015-11-10 NOTE — Assessment & Plan Note (Signed)
He has progressive weight loss due to dysgeusia and nausea His radiation oncologist has recommended placement of feeding tube I think it is a good idea He will continue close follow-up with nutritionist

## 2015-11-10 NOTE — Telephone Encounter (Signed)
"  [name] has nausea despite taking the zofran and compazine.  He's tried to drink keeping trash can beside him.  Yesterday he drank 1/4 C milk, 1/4 C Gatorade and 1 C water.  No solid food intake.  No vomiting.  Dr. Alvy Bimler says she manages the nausea and we need whatever alternatives she has.  RT today at 10:20.

## 2015-11-10 NOTE — Progress Notes (Signed)
Mr. Benjamin Barr presents for his 27nd fraction of radiation to his Oropharynx and bilateral neck. He reports pain a 4/10 in his throat when swallowing. He is using a 25 mcg Fentanyl patch and Hycet about once daily.   His upper back and anterior neck have peeling present. The right side of his neck has some areas that are red, with peeling noted. He is using sonafine cream twice daily. He did not drink  any Boost over the last week because of nausea. He is only drinking 16 ounces of water daily per his report. He is not eating much orally. He is not able to taste food well, which makes it difficult to eat or drink.  Wt Readings from Last 3 Encounters:  11/10/15 177 lb 12.8 oz (80.6 kg)  11/04/15 182 lb (82.6 kg)  11/03/15 182 lb (82.6 kg)  BP 131/81 (BP Location: Right Arm, Patient Position: Sitting, Cuff Size: Normal)   Pulse 76   Temp 98.3 F (36.8 C) (Oral)   Resp 18   Ht 5\' 8"  (1.727 m)   Wt 177 lb 12.8 oz (80.6 kg)   SpO2 99%   BMI 27.03 kg/m

## 2015-11-10 NOTE — Progress Notes (Signed)
   Weekly Management Note:  Outpatient    ICD-9-CM ICD-10-CM   1. Malignant neoplasm of base of tongue (HCC) 141.0 C01 IR GASTROSTOMY TUBE MOD SED    Current Dose:  54 Gy  Projected Dose: 70 Gy   Narrative:  The patient presents for routine under treatment assessment.  CBCT/MVCT images/Port film x-rays were reviewed.  The chart was checked. Benjamin Barr presents for his 27th fraction of radiation to his Oropharynx and bilateral neck.   Reports dysphagia, poor appetite, taste changes, nausea despite Zofran and Compazine.  Little PO this weekend.     Physical Findings:  height is 5\' 8"  (1.727 m) and weight is 177 lb 12.8 oz (80.6 kg). His oral temperature is 98.3 F (36.8 C). His blood pressure is 131/81 and his pulse is 76. His respiration is 18 and oxygen saturation is 99%.   Wt Readings from Last 3 Encounters:  11/10/15 177 lb 12.8 oz (80.6 kg)  11/04/15 182 lb (82.6 kg)  11/03/15 182 lb (82.6 kg)  Patchy mucositis with thrush over his palette . Skin over his neck is erythematous. Protuberant nodule in right lower neck is regressing further  CMP     Component Value Date/Time   NA 144 11/03/2015 1221   K 3.9 11/03/2015 1221   CL 102 09/15/2015 1345   CO2 29 11/03/2015 1221   GLUCOSE 243 (H) 11/03/2015 1221   BUN 17.4 11/03/2015 1221   CREATININE 1.1 11/03/2015 1221   CALCIUM 10.0 11/03/2015 1221   GFRNONAA >60 09/15/2015 1345   GFRAA >60 09/15/2015 1345    Impression:  The patient is tolerating radiotherapy but I recommend feeding tube due to excessive weight loss and poor intake.  Plan:  Continue radiotherapy as planned. Spent a lengthy amount of time discussing PEG as above.  He is agreeable.    Med/ onc today - IV Fluids.  PEG ordered.  Pt will discuss meds w/ pharmacist - some might be crushed, others turned to suspension. ________________________________   Benjamin Barr, M.D.  This document serves as a record of services personally performed by Benjamin Gibson, MD. It  was created on her behalf by Bethann Humble, a trained medical scribe. The creation of this record is based on the scribe's personal observations and the provider's statements to them. This document has been checked and approved by the attending provider.

## 2015-11-10 NOTE — Assessment & Plan Note (Signed)
He tolerated treatment well with expected side effects with mild mucositis, nausea, progressive weight loss and dysgeusia. Over the weekend, his symptoms of nausea has gotten progressively worse. I have added extra appointment this week and will reassess his symptoms in 3 days

## 2015-11-10 NOTE — Progress Notes (Signed)
[place] OFFICE PROGRESS NOTE  Patient Care Team: Aura Dials, MD as PCP - General (Family Medicine) Beverly Gust, MD as Referring Physician (Otolaryngology) Leota Sauers, RN as Oncology Nurse Navigator Heath Lark, MD as Consulting Physician (Hematology and Oncology) Carolan Clines, MD as Consulting Physician (Urology) Delrae Rend, MD as Consulting Physician (Endocrinology) [name] [name], MD as Consulting Physician (Cardiology) Crista Luria, MD as Consulting Physician (Dermatology)  SUMMARY OF ONCOLOGIC HISTORY:   Tongue cancer [facility])   08/20/2015 Miscellaneous    He was evaluated by ENT      08/22/2015 Imaging    CT neck showed malignant adenopathy right supraclavicular region compatible with metastatic disease. Biopsy recommended. No definite pharyngeal mass lesion identified by CT.       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 No other primary or metastatic cancer. 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 He is seen urgently due to uncontrolled nausea. He had one episode of vomiting despite antiemetics His oral intake is poor due to dysgeusia Pain control is OK. He is constipated, without no bowel movement in 3-4 days. He has flatulence He feels dehydrated  REVIEW OF SYSTEMS:   Constitutional: Denies fevers, chills  Eyes: Denies blurriness of vision Respiratory: Denies cough, dyspnea or  wheezes Cardiovascular: Denies palpitation, chest discomfort or lower extremity swelling Skin: Denies abnormal skin rashes Lymphatics: Denies new lymphadenopathy or easy bruising Neurological:Denies numbness, tingling or new weaknesses Behavioral/Psych: Mood is stable, no new changes  All other systems were reviewed with the patient and are negative.  I have reviewed the past medical history, past surgical history, social history and family history with the patient and they are unchanged from previous note.  ALLERGIES:  is allergic to no known allergies.  MEDICATIONS:  Current Outpatient Prescriptions  Medication Sig Dispense Refill  . clopidogrel (PLAVIX) 75 MG tablet Take 1 tablet (75 mg total) by mouth daily. 90 tablet 0  . fentaNYL (DURAGESIC - DOSED MCG/HR) 25 MCG/HR patch Place 1 patch (25 mcg total) onto the skin every 3 (three) days. 5 patch 0  . fluticasone (FLONASE) 50 MCG/ACT nasal spray Place 2 sprays into both nostrils daily.    [name] HYDROcodone-acetaminophen (HYCET) 7.5-325 mg/15 ml solution Take 10-27mL every 4 hours as needed for pain. 500 mL 0  . insulin aspart protamine- aspart (NOVOLOG MIX 70/30) (70-30) 100 UNIT/ML injection Inject 10-20 Units into the skin daily with supper. 1 unit before large meal. Per sliding scale    . insulin glargine (LANTUS) 100 UNIT/ML injection Inject 100 Units into the skin daily.     [name] lidocaine (XYLOCAINE) 2 % solution Patient: Mix 1part 2% viscous lidocaine, 1part H20. Swish and swallow 38mL of this mixture, 34min before meals and at bedtime, up to QID 100 mL  5  . ondansetron (ZOFRAN) 8 MG tablet Take 1 tablet (8 mg total) by mouth every 8 (eight) hours as needed for nausea. 60 tablet 3  . prochlorperazine (COMPAZINE) 10 MG tablet Take 1 tablet (10 mg total) by mouth every 6 (six) hours as needed. 60 tablet 3  . sodium fluoride (FLUORISHIELD) 1.1 % GEL dental gel Instill one drop of gel per tooth space of fluoride tray. Place over teeth for 5  minutes. Remove. Spit out excess. Repeat nightly. 120 mL 11  . sucralfate (CARAFATE) 1 g tablet Dissolve 1 tablet in 10 mL H20 and swallow up to QID for sore throat. 48 tablet 5   No current facility-administered medications for this visit.    Facility-Administered Medications Ordered in Other Visits  Medication Dose Route Frequency Provider Last Rate Last Dose  . alteplase (CATHFLO ACTIVASE) injection 2 mg  2 mg Intracatheter Once PRN Heath Lark, MD      . heparin lock flush 100 unit/mL  500 Units Intracatheter Once PRN Heath Lark, MD      . heparin lock flush 100 unit/mL  250 Units Intracatheter Once PRN Heath Lark, MD      . sodium chloride 0.9 % injection 10 mL  10 mL Intracatheter PRN Heath Lark, MD        PHYSICAL EXAMINATION: ECOG PERFORMANCE STATUS: 1 - Symptomatic but completely ambulatory  Vitals:   11/10/15 1205  BP: 131/81  Pulse: 76  Resp: 18  Temp: 98.3 F (36.8 C)   Filed Weights   11/10/15 1205  Weight: 177 lb (80.3 kg)    GENERAL:alert, no distress and comfortable SKIN: skin color, texture, turgor are normal, no rashes or significant lesions EYES: normal, Conjunctiva are pink and non-injected, sclera clear OROPHARYNX:no exudate, no erythema and lips, buccal mucosa, and tongue normal . Noted dry mucous membrane NECK: supple, thyroid normal size, non-tender, without nodularity LYMPH:  no palpable lymphadenopathy in the cervical, axillary or inguinal LUNGS: clear to auscultation and percussion with normal breathing effort HEART: regular rate & rhythm and no murmurs and no lower extremity edema ABDOMEN:abdomen soft, non-tender and normal bowel sounds Musculoskeletal:no cyanosis of digits and no clubbing  NEURO: alert & oriented x 3 with fluent speech, no focal motor/sensory deficits  LABORATORY DATA:  I have reviewed the data as listed    Component Value Date/Time   NA 144 11/03/2015 1221   K 3.9 11/03/2015 1221   CL 102 09/15/2015 1345   CO2 29 11/03/2015  1221   GLUCOSE 243 (H) 11/03/2015 1221   BUN 17.4 11/03/2015 1221   CREATININE 1.1 11/03/2015 1221   CALCIUM 10.0 11/03/2015 1221   GFRNONAA >60 09/15/2015 1345   GFRAA >60 09/15/2015 1345    No results found for: SPEP, UPEP  Lab Results  Component Value Date   WBC 11.1 (H) 09/15/2015   NEUTROABS 7.8 (H) 11/07/2014   HGB 15.8 09/15/2015   HCT 46.5 09/15/2015   MCV 85.0 09/15/2015   PLT 162 09/15/2015      Chemistry      Component Value Date/Time   NA 144 11/03/2015 1221   K 3.9 11/03/2015 1221   CL 102 09/15/2015 1345   CO2 29 11/03/2015 1221   BUN 17.4 11/03/2015 1221   CREATININE 1.1 11/03/2015 1221      Component Value Date/Time   CALCIUM 10.0 11/03/2015 1221      ASSESSMENT & PLAN:  Cancer of base of tongue ([place]) He tolerated treatment well with expected side  effects with mild mucositis, nausea, progressive weight loss and dysgeusia. Over the weekend, his symptoms of nausea has gotten progressively worse. I have added extra appointment this week and will reassess his symptoms in 3 days  Constipation, acute He has intermittent significant constipation, multifactorial, likely due to poor oral intake, mild dehydration, or related to side effects of pain medication prescription. I reinforced the importance of taking laxatives regularly because constipation can exacerbate nausea.  Nausea without vomiting He has persistent nausea, likely multifactorial related to mucositis, side effects of his prescription pain medicine and constipation. This is despite prescription Zofran and Compazine when necessary. 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 He was prescribed local medication with Carafate, fentanyl, hycet and lidocaine by his radiation oncologist. I recommend he continues the same   No orders of the defined types were placed in this encounter.  All questions were answered. The patient knows to call the clinic with any problems, questions or concerns. No barriers to learning was detected. I spent 25 minutes counseling the patient face to face. The total time spent in the appointment was 30 minutes and more than 50% was on counseling and review of test results     Heath Lark, MD 11/10/2015 1:44 PM

## 2015-11-10 NOTE — Assessment & Plan Note (Signed)
He has started to experience mucositis from radiation treatment. He was prescribed local medication with Carafate, fentanyl, hycet and lidocaine by his radiation oncologist. I recommend he continues the same

## 2015-11-10 NOTE — Assessment & Plan Note (Signed)
He has persistent nausea, likely multifactorial related to mucositis, side effects of his prescription pain medicine and constipation. This is despite prescription Zofran and Compazine when necessary. I will start him on daily IVF and IV anti-emetics

## 2015-11-10 NOTE — Patient Instructions (Signed)

## 2015-11-10 NOTE — Telephone Encounter (Signed)
I will place urgent scheduling message to see him today after his radiation and also will find a place to start him on IVF

## 2015-11-10 NOTE — Telephone Encounter (Signed)
He has arrived to [facility].  Note added at this time to RT appointments.

## 2015-11-10 NOTE — Assessment & Plan Note (Signed)
He has intermittent significant constipation, multifactorial, likely due to poor oral intake, mild dehydration, or related to side effects of pain medication prescription. I reinforced the importance of taking laxatives regularly because constipation can exacerbate nausea.

## 2015-11-11 ENCOUNTER — Telehealth: Payer: Self-pay | Admitting: *Deleted

## 2015-11-11 ENCOUNTER — Ambulatory Visit (HOSPITAL_COMMUNITY)
Admission: RE | Admit: 2015-11-11 | Discharge: 2015-11-11 | Disposition: A | Discharge status: Home / Self Care | Payer: Medicare Other | Source: Ambulatory Visit | Attending: Interventional Radiology | Admitting: Interventional Radiology

## 2015-11-11 ENCOUNTER — Ambulatory Visit
Admission: RE | Admit: 2015-11-11 | Discharge: 2015-11-11 | Disposition: A | Discharge status: Home / Self Care | Payer: Medicare Other | Source: Ambulatory Visit | Attending: Radiation Oncology | Admitting: Radiation Oncology

## 2015-11-11 ENCOUNTER — Other Ambulatory Visit: Payer: Self-pay | Admitting: *Deleted

## 2015-11-11 ENCOUNTER — Ambulatory Visit: Payer: Medicare Other | Admitting: Nutrition

## 2015-11-11 ENCOUNTER — Ambulatory Visit (HOSPITAL_BASED_OUTPATIENT_CLINIC_OR_DEPARTMENT_OTHER): Payer: Medicare Other | Admitting: Nurse Practitioner

## 2015-11-11 ENCOUNTER — Encounter: Payer: Self-pay | Admitting: *Deleted

## 2015-11-11 ENCOUNTER — Other Ambulatory Visit (HOSPITAL_COMMUNITY): Payer: Self-pay | Admitting: Interventional Radiology

## 2015-11-11 VITALS — BP 120/70 | HR 88 | Temp 97.9°F | Resp 17 | Wt 177.1 lb

## 2015-11-11 DIAGNOSIS — C01 Malignant neoplasm of base of tongue: Secondary | ICD-10-CM

## 2015-11-11 DIAGNOSIS — N281 Cyst of kidney, acquired: Secondary | ICD-10-CM | POA: Diagnosis not present

## 2015-11-11 DIAGNOSIS — I2510 Atherosclerotic heart disease of native coronary artery without angina pectoris: Secondary | ICD-10-CM | POA: Diagnosis not present

## 2015-11-11 DIAGNOSIS — C029 Malignant neoplasm of tongue, unspecified: Secondary | ICD-10-CM | POA: Insufficient documentation

## 2015-11-11 DIAGNOSIS — K8020 Calculus of gallbladder without cholecystitis without obstruction: Secondary | ICD-10-CM | POA: Diagnosis not present

## 2015-11-11 DIAGNOSIS — K8689 Other specified diseases of pancreas: Secondary | ICD-10-CM | POA: Diagnosis not present

## 2015-11-11 DIAGNOSIS — R1310 Dysphagia, unspecified: Secondary | ICD-10-CM | POA: Diagnosis present

## 2015-11-11 DIAGNOSIS — Z510 Encounter for antineoplastic radiation therapy: Secondary | ICD-10-CM | POA: Diagnosis not present

## 2015-11-11 DIAGNOSIS — N200 Calculus of kidney: Secondary | ICD-10-CM | POA: Diagnosis not present

## 2015-11-11 DIAGNOSIS — R110 Nausea: Secondary | ICD-10-CM

## 2015-11-11 MED ORDER — SODIUM CHLORIDE 0.9 % IV SOLN
10.0000 mg | Freq: Once | INTRAVENOUS | Status: AC
  Administered 2015-11-11: 10 mg via INTRAVENOUS
  Filled 2015-11-11: qty 1

## 2015-11-11 MED ORDER — PROCHLORPERAZINE EDISYLATE 5 MG/ML IJ SOLN
10.0000 mg | Freq: Once | INTRAMUSCULAR | Status: AC
  Administered 2015-11-11: 10 mg via INTRAVENOUS

## 2015-11-11 MED ORDER — SCOPOLAMINE 1 MG/3DAYS TD PT72: 1.0000 | MEDICATED_PATCH | TRANSDERMAL | 0 refills | Status: DC

## 2015-11-11 MED ORDER — PROCHLORPERAZINE EDISYLATE 5 MG/ML IJ SOLN
INTRAMUSCULAR | Status: AC
Start: 2015-11-11 — End: 2015-11-11
  Filled 2015-11-11: qty 2

## 2015-11-11 MED ORDER — SODIUM CHLORIDE 0.9 % IV SOLN
Freq: Once | INTRAVENOUS | Status: AC
  Administered 2015-11-11: 12:00:00 via INTRAVENOUS

## 2015-11-11 NOTE — Progress Notes (Signed)
Oncology Nurse Navigator Documentation  Met with Mr. Krantz and his wife in [facility] where he was receiving IVF. We discussed his PEG placement scheduled for this Thursday with 1130 arrival at [facility]. We agreed to my providing PEG education tomorrow during scheduled IVF.  [name], RN, BSN, [place] at [place] [phone]

## 2015-11-11 NOTE — Telephone Encounter (Signed)
Oncology Nurse Navigator Documentation  Spoke with [facility] with [facility], arranged for delivery of PEG starter kit to [facility] Short Stay for Mr Gassert.  [name], RN, BSN, [place] at [place] [phone]

## 2015-11-11 NOTE — Patient Instructions (Signed)
Scopolamine skin patches What is this medicine? SCOPOLAMINE (skoe POL a meen) is used to prevent nausea and vomiting caused by motion sickness, anesthesia and surgery. This medicine may be used for other purposes; ask your health care provider or pharmacist if you have questions. What should I tell my health care provider before I take this medicine? They need to know if you have any of these conditions: -glaucoma -kidney or liver disease -an unusual or allergic reaction (especially skin allergy) to scopolamine, atropine, other medicines, foods, dyes, or preservatives -pregnant or trying to get pregnant -breast-feeding How should I use this medicine? This medicine is for external use only. Follow the directions on the prescription label. One patch contains enough medicine to prevent motion sickness for up to 3 days. Apply the patch at least 4 hours before you need it and only wear one disc at a time. Choose an area behind the ear, that is clean, dry, hairless and free from any cuts or irritation. Wipe the area with a clean dry tissue. Peel off the plastic backing of the skin patch, trying not to touch the adhesive side with your hands. Do not cut the patches. Firmly apply to the area you have chosen, with the metallic side of the patch to the skin and the tan-colored side showing. Once firmly in place, wash your hands well with soap and water. Remove the disc after 3 days, or sooner if you no longer need it. After removing the patch, wash your hands and the area behind your ear thoroughly with soap and water. The patch will still contain some medicine after use. To avoid accidental contact or ingestion by children or pets, fold the used patch in half with the sticky side together and throw away in the trash out of the reach of children and pets. If you need to use a second patch after you remove the first, place it behind the other ear. Talk to your pediatrician regarding the use of this medicine in  children. Special care may be needed. Overdosage: If you think you have taken too much of this medicine contact a poison control center or emergency room at once. NOTE: This medicine is only for you. Do not share this medicine with others. What if I miss a dose? Make sure you apply the patch at least 4 hours before you need it. You can apply it the night before traveling. What may interact with this medicine? -benztropine -bethanechol -medicines for anxiety or sleeping problems like diazepam or temazepam -medicines for hay fever and other allergies -medicines for mental depression -muscle relaxants This list may not describe all possible interactions. Give your health care provider a list of all the medicines, herbs, non-prescription drugs, or dietary supplements you use. Also tell them if you smoke, drink alcohol, or use illegal drugs. Some items may interact with your medicine. What should I watch for while using this medicine? Keep the patch dry, if possible, to prevent it from falling off. Limited contact with water, however, as in bathing or swimming, will not affect the system. If the patch falls off, throw it away and put a new one behind the other ear. You may get drowsy or dizzy. Do not drive, use machinery, or do anything that needs mental alertness until you know how this medicine affects you. Do not stand or sit up quickly, especially if you are an older patient. This reduces the risk of dizzy or fainting spells. Alcohol may interfere with the effect of this  medicine. Avoid alcoholic drinks. Your mouth may get dry. Chewing sugarless gum or sucking hard candy, and drinking plenty of water may help. Contact your doctor if the problem does not go away or is severe. This medicine may cause dry eyes and blurred vision. If you wear contact lenses you may feel some discomfort. Lubricating drops may help. See your eye doctor if the problem does not go away or is severe. If you are going to have  a magnetic resonance imaging (MRI) procedure, tell your MRI technician if you have this patch on your body. It must be removed before a MRI. What side effects may I notice from receiving this medicine? Side effects that you should report to your doctor or health care professional as soon as possible: -agitation, nervousness, confusion -blurred vision and other eye problems -dizziness, drowsiness -eye pain or redness in the whites of the eye -hallucinations -pain or difficulty passing urine -skin rash, itching -vomiting Side effects that usually do not require medical attention (report to your doctor or health care professional if they continue or are bothersome): -headache -nausea This list may not describe all possible side effects. Call your doctor for medical advice about side effects. You may report side effects to FDA at 1-800-FDA-1088. Where should I keep my medicine? Keep out of the reach of children. Store at room temperature between 20 and 25 degrees C (68 and 77 degrees F). Throw away any unused medicine after the expiration date. When you remove a patch, fold it and throw it in the trash as described above. NOTE: This sheet is a summary. It may not cover all possible information. If you have questions about this medicine, talk to your doctor, pharmacist, or health care provider.    2016, Elsevier/Gold Standard. (2011-06-24 13:31:48) Dehydration, Adult Dehydration is a condition in which you do not have enough fluid or water in your body. It happens when you take in less fluid than you lose. Vital organs such as the kidneys, brain, and heart cannot function without a proper amount of fluids. Any loss of fluids from the body can cause dehydration.  Dehydration can range from mild to severe. This condition should be treated right away to help prevent it from becoming severe. CAUSES  This condition may be caused by:  Vomiting.  Diarrhea.  Excessive sweating, such as when  exercising in hot or humid weather.  Not drinking enough fluid during strenuous exercise or during an illness.  Excessive urine output.  Fever.  Certain medicines. RISK FACTORS This condition is more likely to develop in:  People who are taking certain medicines that cause the body to lose excess fluid (diuretics).   People who have a chronic illness, such as diabetes, that may increase urination.  Older adults.   People who live at high altitudes.   People who participate in endurance sports.  SYMPTOMS  Mild Dehydration  Thirst.  Dry lips.  Slightly dry mouth.  Dry, warm skin. Moderate Dehydration  Very dry mouth.   Muscle cramps.   Dark urine and decreased urine production.   Decreased tear production.   Headache.   Light-headedness, especially when you stand up from a sitting position.  Severe Dehydration  Changes in skin.   Cold and clammy skin.   Skin does not spring back quickly when lightly pinched and released.   Changes in body fluids.   Extreme thirst.   No tears.   Not able to sweat when body temperature is high, such as in  hot weather.   Minimal urine production.   Changes in vital signs.   Rapid, weak pulse (more than 100 beats per minute when you are sitting still).   Rapid breathing.   Low blood pressure.   Other changes.   Sunken eyes.   Cold hands and feet.   Confusion.  Lethargy and difficulty being awakened.  Fainting (syncope).   Short-term weight loss.   Unconsciousness. DIAGNOSIS  This condition may be diagnosed based on your symptoms. You may also have tests to determine how severe your dehydration is. These tests may include:   Urine tests.   Blood tests.  TREATMENT  Treatment for this condition depends on the severity. Mild or moderate dehydration can often be treated at home. Treatment should be started right away. Do not wait until dehydration becomes severe. Severe  dehydration needs to be treated at the hospital. Treatment for Mild Dehydration  Drinking plenty of water to replace the fluid you have lost.   Replacing minerals in your blood (electrolytes) that you may have lost.  Treatment for Moderate Dehydration  Consuming oral rehydration solution (ORS). Treatment for Severe Dehydration  Receiving fluid through an IV tube.   Receiving electrolyte solution through a feeding tube that is passed through your nose and into your stomach (nasogastric tube or NG tube).  Correcting any abnormalities in electrolytes. HOME CARE INSTRUCTIONS   Drink enough fluid to keep your urine clear or pale yellow.   Drink water or fluid slowly by taking small sips. You can also try sucking on ice cubes.  Have food or beverages that contain electrolytes. Examples include bananas and sports drinks.  Take over-the-counter and prescription medicines only as told by your health care provider.   Prepare ORS according to the manufacturer's instructions. Take sips of ORS every 5 minutes until your urine returns to normal.  If you have vomiting or diarrhea, continue to try to drink water, ORS, or both.   If you have diarrhea, avoid:   Beverages that contain caffeine.   Fruit juice.   Milk.   Carbonated soft drinks.  Do not take salt tablets. This can lead to the condition of having too much sodium in your body (hypernatremia).  SEEK MEDICAL CARE IF:  You cannot eat or drink without vomiting.  You have had moderate diarrhea during a period of more than 24 hours.  You have a fever. SEEK IMMEDIATE MEDICAL CARE IF:   You have extreme thirst.  You have severe diarrhea.  You have not urinated in 6-8 hours, or you have urinated only a small amount of very dark urine.  You have shriveled skin.  You are dizzy, confused, or both.   This information is not intended to replace advice given to you by your health care provider. Make sure you  discuss any questions you have with your health care provider.   Document Released: 01/25/2005 Document Revised: 10/16/2014 Document Reviewed: 06/12/2014 Elsevier Interactive Patient Education [employer].

## 2015-11-11 NOTE — Progress Notes (Signed)
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 Current weight documented as 178 pounds October 2. Patient reports he cannot swallow any food or any liquids since the weekend. Complains of increased mucus and saliva. Reports vomiting as soon as liquids touch his stomach. Reports a bowel movement this morning but no improvement in nausea. Reports feeding tube is scheduled for end of this week.  Estimated nutrition needs: 2000-2200 calories, 110-125 grams protein, greater than 2.2 L fluid.  Nutrition diagnosis: Inadequate oral intake continues.  Intervention: Educated patient to continue fluids as tolerated. Will begin tube feeding once feeding tube is improved for use. Patient will be at risk for refeeding syndrome secondary to prolonged nothing by mouth status and cancer diagnosis. Recommended M.D. Check Cmet, Phosphorus and magnesium 2- 3 times weekly and replete as needed. Questions were answered.  Teach back method used.  Monitoring, evaluation, goals:  Patient will tolerate IV fluids and some oral intake along with tube feeding to meet estimated nutrition needs.  Next visit: Wednesday, October 11.  **Disclaimer: This note was dictated with voice recognition software. Similar sounding words can inadvertently be transcribed and this note may contain transcription errors which may not have been corrected upon publication of note.**

## 2015-11-11 NOTE — Progress Notes (Signed)
ptto have CT scan this afternon in preparation for PEG tube placement on 11/13/15. Pt and wife aware. To return tomorrow for additional IV fluids.

## 2015-11-12 ENCOUNTER — Ambulatory Visit
Admission: RE | Admit: 2015-11-12 | Discharge: 2015-11-12 | Disposition: A | Discharge status: Home / Self Care | Payer: Medicare Other | Source: Ambulatory Visit | Attending: Radiation Oncology | Admitting: Radiation Oncology

## 2015-11-12 ENCOUNTER — Other Ambulatory Visit (HOSPITAL_COMMUNITY): Payer: Self-pay

## 2015-11-12 ENCOUNTER — Ambulatory Visit (HOSPITAL_BASED_OUTPATIENT_CLINIC_OR_DEPARTMENT_OTHER): Payer: Medicare Other | Admitting: Nurse Practitioner

## 2015-11-12 ENCOUNTER — Other Ambulatory Visit: Payer: Self-pay | Admitting: Radiology

## 2015-11-12 ENCOUNTER — Encounter: Payer: Self-pay | Admitting: *Deleted

## 2015-11-12 ENCOUNTER — Telehealth: Payer: Self-pay | Admitting: *Deleted

## 2015-11-12 VITALS — BP 134/64 | HR 74 | Temp 98.1°F | Resp 18 | Ht 68.0 in | Wt 176.4 lb

## 2015-11-12 DIAGNOSIS — C01 Malignant neoplasm of base of tongue: Secondary | ICD-10-CM | POA: Diagnosis not present

## 2015-11-12 DIAGNOSIS — R110 Nausea: Secondary | ICD-10-CM

## 2015-11-12 DIAGNOSIS — Z510 Encounter for antineoplastic radiation therapy: Secondary | ICD-10-CM | POA: Diagnosis not present

## 2015-11-12 DIAGNOSIS — C029 Malignant neoplasm of tongue, unspecified: Secondary | ICD-10-CM

## 2015-11-12 MED ORDER — SODIUM CHLORIDE 0.9 % IV SOLN
Freq: Once | INTRAVENOUS | Status: AC
  Administered 2015-11-12: 12:00:00 via INTRAVENOUS

## 2015-11-12 MED ORDER — PROCHLORPERAZINE EDISYLATE 5 MG/ML IJ SOLN
10.0000 mg | Freq: Once | INTRAMUSCULAR | Status: AC
  Administered 2015-11-12: 10 mg via INTRAVENOUS

## 2015-11-12 MED ORDER — PROCHLORPERAZINE EDISYLATE 5 MG/ML IJ SOLN
INTRAMUSCULAR | Status: AC
  Filled 2015-11-12: qty 2

## 2015-11-12 NOTE — Telephone Encounter (Signed)
Received fax with prior authorization for scopolamine patch 72 hr. Approval for coverage 08/14/15- 11/11/16

## 2015-11-12 NOTE — Patient Instructions (Signed)

## 2015-11-12 NOTE — Progress Notes (Signed)
Oncology Nurse Navigator Documentation  Met with Benjamin Barr and his wife in [facility] while he was receiving scheduled IVF to provide PEG education prior to tomorrow's PEG placement by Benjamin Barr. Using  PEG teaching device   and Teach Back, provided education for PEG use and care, including: hand hygiene, gravity bolus administration of daily water flushes, nutritional supplement, fluids and medications; care of tube insertion site including daily dressing change and cleaning; S&S of infection.  Benjamin Barr passively participated.  Benjamin Barr correctly verbalized dressing change and cleaning procedures, provided correct return demonstration of dressing change and gravity administration of water including manipulation of tube clamp.  They understand I will be available for ongoing PEG educational support, will plan to see them Friday when he arrives for RT.  I explained a PEG supply kit will be delivered to [facility], discussed content of kit.  They understand I will consult with Benjamin Barr, Nutrition, regarding initiation of nutritional supplement including daily supplement goal.  Benjamin Orem, RN, BSN, [place] [facility] [place] [phone]    .

## 2015-11-13 ENCOUNTER — Other Ambulatory Visit: Payer: Self-pay | Admitting: *Deleted

## 2015-11-13 ENCOUNTER — Ambulatory Visit (HOSPITAL_BASED_OUTPATIENT_CLINIC_OR_DEPARTMENT_OTHER): Payer: Medicare Other | Admitting: Nurse Practitioner

## 2015-11-13 ENCOUNTER — Encounter: Payer: Self-pay | Admitting: Hematology and Oncology

## 2015-11-13 ENCOUNTER — Other Ambulatory Visit: Payer: Self-pay

## 2015-11-13 ENCOUNTER — Other Ambulatory Visit: Payer: Self-pay | Admitting: Hematology and Oncology

## 2015-11-13 ENCOUNTER — Ambulatory Visit
Admission: RE | Admit: 2015-11-13 | Discharge: 2015-11-13 | Disposition: A | Discharge status: Home / Self Care | Payer: Medicare Other | Source: Ambulatory Visit | Attending: Radiation Oncology | Admitting: Radiation Oncology

## 2015-11-13 ENCOUNTER — Encounter (HOSPITAL_COMMUNITY): Payer: Self-pay

## 2015-11-13 ENCOUNTER — Other Ambulatory Visit: Payer: Self-pay | Admitting: General Surgery

## 2015-11-13 ENCOUNTER — Ambulatory Visit (HOSPITAL_BASED_OUTPATIENT_CLINIC_OR_DEPARTMENT_OTHER): Payer: Medicare Other | Admitting: Hematology and Oncology

## 2015-11-13 ENCOUNTER — Ambulatory Visit (HOSPITAL_BASED_OUTPATIENT_CLINIC_OR_DEPARTMENT_OTHER): Payer: Medicare Other

## 2015-11-13 ENCOUNTER — Ambulatory Visit (HOSPITAL_COMMUNITY)
Admission: RE | Admit: 2015-11-13 | Discharge: 2015-11-13 | Disposition: A | Discharge status: Home / Self Care | Payer: Medicare Other | Source: Ambulatory Visit | Attending: Radiation Oncology | Admitting: Radiation Oncology

## 2015-11-13 ENCOUNTER — Ambulatory Visit: Payer: Medicare Other

## 2015-11-13 VITALS — BP 155/68 | HR 58 | Temp 97.8°F | Resp 16

## 2015-11-13 DIAGNOSIS — E669 Obesity, unspecified: Secondary | ICD-10-CM | POA: Diagnosis not present

## 2015-11-13 DIAGNOSIS — Z7902 Long term (current) use of antithrombotics/antiplatelets: Secondary | ICD-10-CM | POA: Insufficient documentation

## 2015-11-13 DIAGNOSIS — C01 Malignant neoplasm of base of tongue: Secondary | ICD-10-CM

## 2015-11-13 DIAGNOSIS — C770 Secondary and unspecified malignant neoplasm of lymph nodes of head, face and neck: Secondary | ICD-10-CM | POA: Diagnosis not present

## 2015-11-13 DIAGNOSIS — D630 Anemia in neoplastic disease: Secondary | ICD-10-CM | POA: Diagnosis not present

## 2015-11-13 DIAGNOSIS — E162 Hypoglycemia, unspecified: Secondary | ICD-10-CM

## 2015-11-13 DIAGNOSIS — I4510 Unspecified right bundle-branch block: Secondary | ICD-10-CM | POA: Diagnosis not present

## 2015-11-13 DIAGNOSIS — Z85828 Personal history of other malignant neoplasm of skin: Secondary | ICD-10-CM | POA: Diagnosis not present

## 2015-11-13 DIAGNOSIS — E785 Hyperlipidemia, unspecified: Secondary | ICD-10-CM | POA: Diagnosis not present

## 2015-11-13 DIAGNOSIS — Z955 Presence of coronary angioplasty implant and graft: Secondary | ICD-10-CM | POA: Insufficient documentation

## 2015-11-13 DIAGNOSIS — I10 Essential (primary) hypertension: Secondary | ICD-10-CM | POA: Insufficient documentation

## 2015-11-13 DIAGNOSIS — Z510 Encounter for antineoplastic radiation therapy: Secondary | ICD-10-CM | POA: Diagnosis not present

## 2015-11-13 DIAGNOSIS — Z8042 Family history of malignant neoplasm of prostate: Secondary | ICD-10-CM | POA: Diagnosis not present

## 2015-11-13 DIAGNOSIS — E1140 Type 2 diabetes mellitus with diabetic neuropathy, unspecified: Secondary | ICD-10-CM

## 2015-11-13 DIAGNOSIS — R110 Nausea: Secondary | ICD-10-CM | POA: Diagnosis not present

## 2015-11-13 DIAGNOSIS — C029 Malignant neoplasm of tongue, unspecified: Secondary | ICD-10-CM

## 2015-11-13 DIAGNOSIS — Z794 Long term (current) use of insulin: Secondary | ICD-10-CM | POA: Insufficient documentation

## 2015-11-13 DIAGNOSIS — I252 Old myocardial infarction: Secondary | ICD-10-CM | POA: Insufficient documentation

## 2015-11-13 DIAGNOSIS — Z7951 Long term (current) use of inhaled steroids: Secondary | ICD-10-CM | POA: Insufficient documentation

## 2015-11-13 DIAGNOSIS — Z8249 Family history of ischemic heart disease and other diseases of the circulatory system: Secondary | ICD-10-CM | POA: Insufficient documentation

## 2015-11-13 DIAGNOSIS — K5730 Diverticulosis of large intestine without perforation or abscess without bleeding: Secondary | ICD-10-CM | POA: Diagnosis not present

## 2015-11-13 DIAGNOSIS — Z8546 Personal history of malignant neoplasm of prostate: Secondary | ICD-10-CM | POA: Insufficient documentation

## 2015-11-13 DIAGNOSIS — R634 Abnormal weight loss: Secondary | ICD-10-CM

## 2015-11-13 DIAGNOSIS — Z86010 Personal history of colonic polyps: Secondary | ICD-10-CM | POA: Insufficient documentation

## 2015-11-13 DIAGNOSIS — E1142 Type 2 diabetes mellitus with diabetic polyneuropathy: Secondary | ICD-10-CM | POA: Diagnosis not present

## 2015-11-13 DIAGNOSIS — E113299 Type 2 diabetes mellitus with mild nonproliferative diabetic retinopathy without macular edema, unspecified eye: Secondary | ICD-10-CM | POA: Diagnosis not present

## 2015-11-13 DIAGNOSIS — E440 Moderate protein-calorie malnutrition: Secondary | ICD-10-CM

## 2015-11-13 DIAGNOSIS — R1310 Dysphagia, unspecified: Secondary | ICD-10-CM | POA: Insufficient documentation

## 2015-11-13 DIAGNOSIS — I2510 Atherosclerotic heart disease of native coronary artery without angina pectoris: Secondary | ICD-10-CM | POA: Insufficient documentation

## 2015-11-13 HISTORY — PX: IR GENERIC HISTORICAL: IMG1180011

## 2015-11-13 LAB — CBC WITH DIFFERENTIAL/PLATELET
BASO%: 0.2 % (ref 0.0–2.0)
BASOS ABS: 0 10*3/uL (ref 0.0–0.1)
EOS ABS: 0 10*3/uL (ref 0.0–0.5)
EOS%: 0.2 % (ref 0.0–7.0)
HEMATOCRIT: 44.5 % (ref 38.4–49.9)
HEMOGLOBIN: 14.6 g/dL (ref 13.0–17.1)
LYMPH%: 2.5 % — ABNORMAL LOW (ref 14.0–49.0)
MCH: 27.8 pg (ref 27.2–33.4)
MCHC: 32.9 g/dL (ref 32.0–36.0)
MCV: 84.5 fL (ref 79.3–98.0)
MONO#: 0.6 10*3/uL (ref 0.1–0.9)
MONO%: 6.9 % (ref 0.0–14.0)
NEUT#: 8.4 10*3/uL — ABNORMAL HIGH (ref 1.5–6.5)
NEUT%: 90.2 % — AB (ref 39.0–75.0)
Platelets: 155 10*3/uL (ref 140–400)
RBC: 5.27 10*6/uL (ref 4.20–5.82)
RDW: 14.3 % (ref 11.0–14.6)
WBC: 9.3 10*3/uL (ref 4.0–10.3)
lymph#: 0.2 10*3/uL — ABNORMAL LOW (ref 0.9–3.3)

## 2015-11-13 LAB — COMPREHENSIVE METABOLIC PANEL
ALBUMIN: 3.2 g/dL — AB (ref 3.5–5.0)
ALK PHOS: 90 U/L (ref 40–150)
ALT: 10 U/L (ref 0–55)
AST: 17 U/L (ref 5–34)
Anion Gap: 11 mEq/L (ref 3–11)
BUN: 18.9 mg/dL (ref 7.0–26.0)
CALCIUM: 9.1 mg/dL (ref 8.4–10.4)
CHLORIDE: 110 meq/L — AB (ref 98–109)
CO2: 23 mEq/L (ref 22–29)
Creatinine: 0.9 mg/dL (ref 0.7–1.3)
EGFR: 85 mL/min/{1.73_m2} — AB (ref >90)
Glucose: 40 mg/dl — CL (ref 70–140)
POTASSIUM: 3.7 meq/L (ref 3.5–5.1)
SODIUM: 145 meq/L (ref 136–145)
Total Bilirubin: 0.71 mg/dL (ref 0.20–1.20)
Total Protein: 6.6 g/dL (ref 6.4–8.3)

## 2015-11-13 LAB — GLUCOSE, CAPILLARY
GLUCOSE-CAPILLARY: 62 mg/dL — AB (ref 65–99)
Glucose-Capillary: 50 mg/dL — ABNORMAL LOW (ref 65–99)
Glucose-Capillary: 111 mg/dL — ABNORMAL HIGH (ref 65–99)

## 2015-11-13 LAB — PROTIME-INR
INR: 1.14
Prothrombin Time: 14.7 seconds (ref 11.4–15.2)

## 2015-11-13 LAB — WHOLE BLOOD GLUCOSE: Glucose: 78 mg/dL (ref 70–100)

## 2015-11-13 LAB — APTT: APTT: 30 s (ref 24–36)

## 2015-11-13 MED ORDER — FENTANYL CITRATE (PF) 100 MCG/2ML IJ SOLN
INTRAMUSCULAR | Status: AC | PRN
  Administered 2015-11-13 (×3): 50 ug via INTRAVENOUS

## 2015-11-13 MED ORDER — DEXTROSE 50 % IV SOLN
0.5000 | Freq: Once | INTRAVENOUS | Status: AC
  Administered 2015-11-13: 25 mL via INTRAVENOUS

## 2015-11-13 MED ORDER — PROCHLORPERAZINE EDISYLATE 5 MG/ML IJ SOLN
10.0000 mg | Freq: Once | INTRAMUSCULAR | Status: AC
  Administered 2015-11-13: 10 mg via INTRAVENOUS

## 2015-11-13 MED ORDER — DEXTROSE 50 % IV SOLN
INTRAVENOUS | Status: AC
  Filled 2015-11-13: qty 50

## 2015-11-13 MED ORDER — NALOXONE HCL 0.4 MG/ML IJ SOLN
INTRAMUSCULAR | Status: AC
  Filled 2015-11-13: qty 1

## 2015-11-13 MED ORDER — BACITRACIN-NEOMYCIN-POLYMYXIN 400-5-5000 EX OINT
1.0000 "application " | TOPICAL_OINTMENT | Freq: Every day | CUTANEOUS | Status: DC
  Filled 2015-11-13 (×2): qty 1

## 2015-11-13 MED ORDER — SODIUM CHLORIDE 0.9 % IV SOLN
Freq: Once | INTRAVENOUS | Status: AC
  Administered 2015-11-13: 08:00:00 via INTRAVENOUS

## 2015-11-13 MED ORDER — FLUMAZENIL 0.5 MG/5ML IV SOLN
INTRAVENOUS | Status: AC
  Filled 2015-11-13: qty 5

## 2015-11-13 MED ORDER — KETOROLAC TROMETHAMINE 60 MG/2ML IM SOLN
60.0000 mg | Freq: Once | INTRAMUSCULAR | Status: AC
  Administered 2015-11-13: 60 mg via INTRAMUSCULAR
  Filled 2015-11-13: qty 2

## 2015-11-13 MED ORDER — DEXTROSE 50 % IV SOLN: 25.0000 mL | Freq: Once | INTRAVENOUS | Status: DC

## 2015-11-13 MED ORDER — PROCHLORPERAZINE EDISYLATE 5 MG/ML IJ SOLN
INTRAMUSCULAR | Status: AC
  Filled 2015-11-13: qty 2

## 2015-11-13 MED ORDER — SODIUM CHLORIDE 0.9 % IV SOLN
INTRAVENOUS | Status: DC
  Administered 2015-11-13: 13:00:00 via INTRAVENOUS

## 2015-11-13 MED ORDER — MIDAZOLAM HCL 2 MG/2ML IJ SOLN
INTRAMUSCULAR | Status: AC
  Filled 2015-11-13: qty 4

## 2015-11-13 MED ORDER — CEFAZOLIN SODIUM-DEXTROSE 2-4 GM/100ML-% IV SOLN
2.0000 g | INTRAVENOUS | Status: DC
  Filled 2015-11-13: qty 100

## 2015-11-13 MED ORDER — DEXTROSE 5 % IV SOLN
INTRAVENOUS | Status: DC
  Administered 2015-11-13: 13:00:00 via INTRAVENOUS

## 2015-11-13 MED ORDER — DEXTROSE 50 % IV SOLN
25.0000 mL | Freq: Once | INTRAVENOUS | Status: AC
  Administered 2015-11-13: 25 mL via INTRAVENOUS
  Filled 2015-11-13: qty 25

## 2015-11-13 MED ORDER — LIDOCAINE HCL 1 % IJ SOLN
INTRAMUSCULAR | Status: AC | PRN
  Administered 2015-11-13: 10 mL

## 2015-11-13 MED ORDER — LIDOCAINE HCL 1 % IJ SOLN
INTRAMUSCULAR | Status: DC
Start: 2015-11-13 — End: 2015-11-14
  Filled 2015-11-13: qty 20

## 2015-11-13 MED ORDER — MIDAZOLAM HCL 2 MG/2ML IJ SOLN
INTRAMUSCULAR | Status: AC | PRN
  Administered 2015-11-13 (×3): 1 mg via INTRAVENOUS

## 2015-11-13 MED ORDER — GLUCAGON HCL RDNA (DIAGNOSTIC) 1 MG IJ SOLR
INTRAMUSCULAR | Status: AC
  Filled 2015-11-13: qty 1

## 2015-11-13 MED ORDER — FENTANYL CITRATE (PF) 100 MCG/2ML IJ SOLN
INTRAMUSCULAR | Status: AC
Start: 2015-11-13 — End: 2015-11-14
  Filled 2015-11-13: qty 4

## 2015-11-13 MED ORDER — IOPAMIDOL (ISOVUE-300) INJECTION 61%
50.0000 mL | Freq: Once | INTRAVENOUS | Status: AC | PRN
  Administered 2015-11-13: 10 mL via INTRAVENOUS

## 2015-11-13 MED ORDER — DEXTROSE 50 % IV SOLN
25.0000 mL | Freq: Once | INTRAVENOUS | Status: DC
  Filled 2015-11-13: qty 25

## 2015-11-13 NOTE — Assessment & Plan Note (Signed)
The patient continues to struggle with disguesia, dehydration, lack of appetite and poor oral intake as a consequence of side-effects pf treatment His radiation oncologist as scheduled for feeding tube placement. I will continue to see him on a weekly basis for supportive care

## 2015-11-13 NOTE — Progress Notes (Signed)
Pt CBG was 40 this am. Pt has been taking insulin in the evening even though he is not eating at this time and only getting .9NS fluids daily for 1 liter. Pt received Dextrose 50% 1/2 amp prior to going to XRT. CBG upon return from XRT was 78.  Peripheral IV saline locked as pt to have PEG tube placement this afternoon.  @1145  pt ambulated with wife at his side to Radiology in preparation for PEG tube placement.  Will return tomorrow for additional IV fluids.

## 2015-11-13 NOTE — Assessment & Plan Note (Addendum)
I am concerned about risk of hypoglycemia due to poor oral intake. Recheck blood sugar this morning shows severe hypoglycemia. The patient is given D50 with good response to treatment. The patient is instructed to modify his insulin regimen due to poor oral intake

## 2015-11-13 NOTE — Consult Note (Signed)
Chief Complaint: Patient was seen in consultation today for percutaneous gastrostomy tube placement  Referring Physician(s): Bryan  Supervising Physician: [name]  Patient Status: Outpatient  History of Present Illness: Benjamin Barr is a 70 y.o. 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 He is currently undergoing radiotherapy and request now received for percutaneous gastrostomy tube placement for nutritional supplementation.  Past Medical History:  Diagnosis Date  . Anemia   . CAD (coronary artery disease)    a. 2008 PCI: Taxus DES  to mRCA, prox & mid LCFX;  b. 10/2014 MV: extensive ischemia in the RCA territory and small area of anterior ischemia;  c. 11/2014 PCI: LM nl, LAD 30ost/prox, 80d, LCX 90ost (3.0x12 Promus Prem DES), OM2 30, RCA 68m (3.0x12 Promus Prem DES), 53m, RPDA 90 (PTCA), EF 55-65%.  . Cancer ([place])    Head & neck ca  . Diverticulosis of colon   . Heart murmur   . History of colon polyps    2014--  hyperplastic, adenoma, and benign polyps  . Hyperlipidemia   . Hypertension   . Myocardial infarction   . 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 Obesity   . Peripheral neuropathy ([name])   . Pneumonia    hx  . Prostate cancer ([place])   . RBBB (right bundle branch block)   . Skin cancer   . Type 2 diabetes mellitus ([place])     Past Surgical History:  Procedure Laterality Date  . APPENDECTOMY    . CARDIAC CATHETERIZATION N/A 11/13/2014   Procedure: Left Heart Cath and Coronary Angiography;  Surgeon: [name] [name], MD;  Location: [place] [facility];  Service: Cardiovascular;  Laterality: N/A;  . CARDIAC CATHETERIZATION  11/13/2014   Procedure: Coronary Stent Intervention;  Surgeon: [name] [name], MD;  Location: [place] [facility];  Service: Cardiovascular;;  . CARDIOVASCULAR STRESS TEST  01-17-2012  dr [name]   Normal perfusion study/ no ischemia or infarction/  normal LVF and  wall motion, ef 54%  . CORONARY ANGIOPLASTY WITH STENT PLACEMENT  05/04/2006   dr [name]   Severe 2-vessel obstructive CAD/  normal LVF/  PCI with DES to proxima and mid CFX and mRCA (total 3 DES)  . CRYOABLATION N/A 04/01/2014   Procedure: CRYO ABLATION PROSTATE;  Surgeon: [name], MD;  Location: [facility];  Service: Urology;  Laterality: N/A;  . DIRECT LARYNGOSCOPY N/A 09/19/2015   Procedure: DIRECT LARYNGOSCOPY;  Surgeon: Izora Gala, MD;  Location: [place];  Service: ENT;  Laterality: N/A;  . ESOPHAGOSCOPY N/A 09/19/2015   Procedure: ESOPHAGOSCOPY;  Surgeon: Izora Gala, MD;  Location: [place];  Service: ENT;  Laterality: N/A;  . FLOOR OF MOUTH BIOPSY N/A 09/19/2015   Procedure: FLOOR OF MOUTH BIOPSY;  Surgeon: Izora Gala, MD;  Location: [facility] OR;  Service: ENT;  Laterality: N/A;  . INSERTION OF SUPRAPUBIC CATHETER N/A 04/01/2014   Procedure: INSERTION OF SUPRAPUBIC CATHETER;  Surgeon: [name], MD;  Location: [facility];  Service: Urology;  Laterality: N/A;  suprapubic   . MULTIPLE TOOTH EXTRACTIONS  09/17/2015  . PARTIAL COLECTOMY  80's    Allergies: No known allergies  Medications: Prior to Admission medications   Medication Sig Start Date End Date Taking? Authorizing Provider  clopidogrel (PLAVIX) 75 MG tablet Take 1 tablet (75 mg total) by mouth daily. 08/22/15   [name] [name], MD  fentaNYL ([place] -  DOSED MCG/HR) 25 MCG/HR patch Place 1 patch (25 mcg total) onto the skin every 3 (three) days. 11/03/15   Eppie Gibson, MD  fluticasone [facility]) 50 MCG/ACT nasal spray Place 2 sprays into both nostrils daily. 08/20/15   Historical Provider, MD  HYDROcodone-acetaminophen (HYCET) 7.5-325 mg/15 ml solution Take 10-39mL every 4 hours as needed for pain. 10/22/15   Eppie Gibson, MD  insulin aspart protamine- aspart (NOVOLOG MIX 70/30) (70-30) 100 UNIT/ML injection Inject 10-20 Units into the skin daily with supper. 1 unit before large meal.  Per sliding scale    Historical Provider, MD  insulin glargine (LANTUS) 100 UNIT/ML injection Inject 100 Units into the skin daily.     Historical Provider, MD  lidocaine (XYLOCAINE) 2 % solution Patient: Mix 1part 2% viscous lidocaine, 1part H20. Swish and swallow 13mL of this mixture, 75min before meals and at bedtime, up to QID 10/06/15   Eppie Gibson, MD  ondansetron (ZOFRAN) 8 MG tablet Take 1 tablet (8 mg total) by mouth every 8 (eight) hours as needed for nausea. 10/28/15   Heath Lark, MD  prochlorperazine (COMPAZINE) 10 MG tablet Take 1 tablet (10 mg total) by mouth every 6 (six) hours as needed. 10/28/15   Heath Lark, MD  scopolamine (TRANSDERM-SCOP) 1 MG/3DAYS Place 1 patch (1.5 mg total) onto the skin every 3 (three) days. 11/11/15   Heath Lark, MD  sodium fluoride (FLUORISHIELD) 1.1 % GEL dental gel Instill one drop of gel per tooth space of fluoride tray. Place over teeth for 5 minutes. Remove. Spit out excess. Repeat nightly. 09/22/15   Lenn Cal, DDS  sucralfate (CARAFATE) 1 g tablet Dissolve 1 tablet in 10 mL H20 and swallow up to QID for sore throat. 10/06/15   Eppie Gibson, MD     Family History  Problem Relation Age of Onset  . Diverticulitis Mother   . Heart failure Father   . Cancer Father     prostate ca    Social History   Social History  . Marital status: Married    Spouse name: Neoma Laming  . Number of children: 2  . Years of education: N/A   Occupational History  . sales     retired   Social History Main Topics  . Smoking status: Never Smoker  . Smokeless tobacco: Never Used  . Alcohol use Yes     Comment: occ beer  . Drug use: No  . Sexual activity: Not on file   Other Topics Concern  . Not on file   Social History Narrative  . No narrative on file      Review of Systems currently denies fever, chest pain, dyspnea, abdominal/back pain, nausea, vomiting or abnormal bleeding. He does have occasional headache, occasional cough,  dysphagia/odynphagia  Vital Signs: BP (!) 151/68 (BP Location: Right Arm)   Pulse (!) 53   Temp 97.8 F (36.6 C) (Oral)   Resp 16   SpO2 100%   Physical Exam patient awake, alert. Chest clear to auscultation bilaterally. Heart slightly bradycardic but regular rhythm, soft murmur. Abdomen soft, positive bowel sounds, nontender. Lower extremities with no edema.  Mallampati Score:     Imaging: Ct Abdomen Wo Contrast  Result Date: 11/11/2015 CLINICAL DATA:  Pre gastrostomy tube planning.  Tongue cancer. EXAM: CT ABDOMEN WITHOUT CONTRAST TECHNIQUE: Multidetector CT imaging of the abdomen was performed following the standard protocol without IV contrast. COMPARISON:  PET/CT 08/29/2015 FINDINGS: Lower chest: Coronary artery atherosclerotic calcification is present. No pulmonary nodules or pleural  effusion. No visible pericardial effusion. Hepatobiliary: Normal hepatic size and contours. No perihepatic ascites. No intra- or extrahepatic biliary dilatation. Multiple small stones are seen within the gallbladder fundus. Pancreas: There is moderate atrophy of the pancreas. No peripancreatic fluid collection. Spleen: Normal. Adrenals/Urinary Tract: Normal adrenal glands. Right renal cyst measures 4.9 cm. Nonobstructing left renal calculus measures 2 mm. No hydronephrosis. No proximal ureteral abnormality. Mild bilateral perinephric stranding, likely senescent. Stomach/Bowel: A portion of the transverse colon is directly anterolateral to the greater curvature of the stomach. There is no dilated bowel or evidence of colitis/enteritis. Vascular/Lymphatic: Moderate atherosclerotic calcification of the abdominal aorta. No aneurysm. No abdominal adenopathy. Musculoskeletal: Multilevel thoracic osteophytosis and lumbar facet arthrosis. No bony spinal canal stenosis. No lytic or blastic lesions. Normal visualized extrathoracic and extraperitoneal soft tissues. Other: No contributory non-categorized findings.  IMPRESSION: 1. A portion of the transverse colon travels anterior to the stomach in the left upper quadrant. 2. Aortic and coronary artery atherosclerosis. 3. Choletlithiasis without evidence of acute cholecystitis. 4. Nonobstructing left nephrolithiasis. Electronically Signed   By: Ulyses Jarred M.D.   On: 11/11/2015 21:11    Labs:  CBC:  Recent Labs  11/14/14 0352 09/15/15 1345 11/13/15 0922  WBC 8.9 11.1* 9.3  HGB 14.6 15.8 14.6  HCT 44.1 46.5 44.5  PLT 176 162 155    COAGS: No results for input(s): INR, APTT in the last 8760 hours.  BMP:  Recent Labs  11/14/14 0352  11/21/14 1132 12/06/14 0950 09/15/15 1345 11/03/15 1221 11/13/15 0922  NA 138  --  135 138 137 144 145  K 3.3*  --  3.8 3.6 3.7 3.9 3.7  CL 100*  --  100 99 102  --   --   CO2 30  --  27 27 25 29 23   GLUCOSE 161*  --  313* 308* 230* 243* 40*  BUN 13  --  26* 19 14 17.4 18.9  CALCIUM 9.1  --  9.6 9.2 10.1 10.0 9.1  CREATININE 1.07  < > 1.41* 1.28* 0.88 1.1 0.9  GFRNONAA >60  --   --   --  >60  --   --   GFRAA >60  --   --   --  >60  --   --   < > = values in this interval not displayed.  LIVER FUNCTION TESTS:  Recent Labs  11/13/15 0922  BILITOT 0.71  AST 17  ALT 10  ALKPHOS 90  PROT 6.6  ALBUMIN 3.2*    TUMOR MARKERS: No results for input(s): AFPTM, CEA, CA199, CHROMGRNA in the last 8760 hours.  Assessment and Plan: 70 y.o. 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 He is currently undergoing radiotherapy and request now received for percutaneous gastrostomy tube placement for nutritional supplementation.Risks and benefits discussed with the patient /wife including, but not limited to the need for a barium enema during the procedure, bleeding, infection, peritonitis, or damage to adjacent structures.All of the patient's questions were answered, patient is agreeable to proceed.Consent signed and in chart.      Thank you for  this interesting consult.  I greatly enjoyed meeting KALAN DAVIDE and look forward to participating in their care.  A copy of this report was sent to the requesting provider on this date.  Electronically Signed: D. Rowe Robert 11/13/2015, 12:39 PM   I spent a total of 25 minutes in face to face in clinical  consultation, greater than 50% of which was counseling/coordinating care for percutaneous gastrostomy tube placement

## 2015-11-13 NOTE — Assessment & Plan Note (Signed)
This is related to his poor oral intake and nausea. He will have feeding tube placement today. The patient would be at risk of refeeding syndrome. He follows with dietitian closely

## 2015-11-13 NOTE — Procedures (Signed)
Interventional Radiology Procedure Note  Procedure: Placement of percutaneous 45F pull-through gastrostomy tube. Complications: None Recommendations: - NPO except for sips and chips remainder of today and overnight - 1 hour obs - May advance diet as tolerated and begin using tube tomorrow morning  Signed,   Dulcy Fanny. Earleen Newport, DO

## 2015-11-13 NOTE — Assessment & Plan Note (Signed)
He has persistent nausea, likely multifactorial related to mucositis, side effects of his prescription pain medicine and constipation. This is despite prescription Zofran and Compazine when necessary. I will start him on daily IVF and IV anti-emetics I have added scopolamine patch on 11/12/15

## 2015-11-13 NOTE — Progress Notes (Signed)
Patient CBG 50 cc at 1305. Patient asymptomatic. Rowe Robert PA informed and ordered 1/2 amp D50. Given at 1315. Patient and wife (at bedside) informed to call RN if feels different at all. IR here to transport patient at 1320 to procedure. Informed Alfred Levins that patient has recently had 1/2 amp D50 at 1315 and IR will recheck CBG.

## 2015-11-13 NOTE — Assessment & Plan Note (Signed)
He has progressive weight loss due to dysgeusia and nausea His radiation oncologist has recommended placement of feeding tube I think it is a good idea He will continue close follow-up with nutritionist

## 2015-11-13 NOTE — Assessment & Plan Note (Signed)
The malignant lymph node had some ulcerated appearance The skin is not broken. It is regressing in size I recommend clean gauze application if it start to discharge

## 2015-11-13 NOTE — Discharge Instructions (Signed)
Gastrostomy Tube Placement, Care After Refer to this sheet in the next few weeks. These instructions provide you with information on caring for yourself after your procedure. Your health care provider may also give you more specific instructions. Your treatment has been planned according to current medical practices, but problems sometimes occur. Call your health care provider if you have any problems or questions after your procedure. WHAT TO EXPECT AFTER THE PROCEDURE  After your procedure, it is typical to have the following:   Mild abdominal pain.  A small amount of blood-tinged fluid leaking from the replacement site. SEEK MEDICAL CARE IF:  You have a fever or chills.  You have redness or irritation near the insertion site.  You continue to have abdominal pain or leaking around your gastrostomy tube. SEEK IMMEDIATE MEDICAL CARE IF:   You develop bleeding or significant discharge around the tube.  You have severe abdominal pain.  Your new tube does not seem to be working properly.  You are unable to get feedings into the tube.  Your tube comes out for any reason.    This information is not intended to replace advice given to you by your health care provider. Make sure you discuss any questions you have with your health care provider.   Document Released: 08/22/2013 Document Reviewed: 08/22/2013 Elsevier Interactive Patient Education 2016 [place]. Gastrostomy Tube Home Guide, Adult A gastrostomy tube is a tube that is surgically placed into the stomach. It is also called a "G-tube." G-tubes are used when a person is unable to eat and drink enough on their own to stay healthy. The tube is inserted into the stomach through a small cut (incision) in the skin. This tube is used for:  Feeding.  Giving medication. GASTROSTOMY TUBE CARE  Wash your hands with soap and water.  Remove the old dressing (if any). Some styles of G-tubes may need a dressing inserted between the  skin and the G-tube. Other types of G-tubes do not require a dressing. Ask your health care provider if a dressing is needed.  Check the area where the tube enters the skin (insertion site) for redness, swelling, or pus-like (purulent) drainage. A small amount of clear or tan liquid drainage is normal. Check to make sure scar tissue (skin) is not growing around the insertion site. This could have a raised, bumpy appearance.  A cotton swab can be used to clean the skin around the tube:  When the G-tube is first put in, a normal saline solution or water can be used to clean the skin.  Mild soap and warm water can be used when the skin around the G-tube site has healed.  Roll the cotton swab around the G-tube insertion site to remove any drainage or crusting at the insertion site. STOMACH RESIDUALS Feeding tube residuals are the amount of liquids that are in the stomach at any given time. Residuals may be checked before giving feedings, medications, or as instructed by your health care provider.  Ask your health care provider if there are instances when you would not start tube feedings depending on the amount or type of contents withdrawn from the stomach.  Check residuals by attaching a syringe to the G-tube and pulling back on the syringe plunger. Note the amount, and return the residual back into the stomach. FLUSHING THE G-TUBE  The G-tube should be periodically flushed with clean warm water to keep it from clogging.  Flush the G-tube after feedings or medications. Draw up 30  mL of warm water in a syringe. Connect the syringe to the G-tube and slowly push the water into the tube.  Do not push feedings, medications, or flushes rapidly. Flush the G-tube gently and slowly.  Only use syringes made for G-tubes to flush medications or feedings.  Your health care provider may want the G-tube flushed more often or with more water. If this is the case, follow your health care provider's  instructions. FEEDINGS Your health care provider will determine whether feedings are given as a bolus (a certain amount given at one time and at scheduled times) or whether feedings will be given continuously on a feeding pump.   Formulas should be given at room temperature.  If feedings are continuous, no more than 4 hours worth of feedings should be placed in the feeding bag. This helps prevent spoilage or accidental excess infusion.  Cover and place unused formula in the refrigerator.  If feedings are continuous, stop the feedings when medications or flushes are given. Be sure to restart the feedings.  Feeding bags and syringes should be replaced as instructed by your health care provider. GIVING MEDICATION   In general, it is best if all medications are in a liquid form for G-tube administration. Liquid medications are less likely to clog the G-tube.  Mix the liquid medication with 30 mL (or amount recommended by your health care provider) of warm water.  Draw up the medication into the syringe.  Attach the syringe to the G-tube and slowly push the mixture into the G-tube.  After giving the medication, draw up 30 mL of warm water in the syringe and slowly flush the G-tube.  For pills or capsules, check with your health care provider first before crushing medications. Some pills are not effective if they are crushed. Some capsules are sustained-release medications.  If appropriate, crush the pill or capsule and mix with 30 mL of warm water. Using the syringe, slowly push the medication through the tube, then flush the tube with another 30 mL of tap water. G-TUBE PROBLEMS G-tube was pulled out.  Cause: May have been pulled out accidentally.  Solutions: Cover the opening with clean dressing and tape. Call your health care provider right away. The G-tube should be put in as soon as possible (within 4 hours) so the G-tube opening (tract) does not close. The G-tube needs to be put in  at a health care setting. An X-ray needs to be done to confirm placement before the G-tube can be used again. Redness, irritation, soreness, or foul odor around the gastrostomy site.  Cause: May be caused by leakage or infection.  Solutions: Call your health care provider right away. Large amount of leakage of fluid or mucus-like liquid present (a large amount means it soaks clothing).  Cause: Many reasons could cause the G-tube to leak.  Solutions: Call your health care provider to discuss the amount of leakage. Skin or scar tissue appears to be growing where tube enters skin.   Cause: Tissue growth may develop around the insertion site if the G-tube is moved or pulled on excessively.  Solutions: Secure tube with tape so that excess movement does not occur. Call your health care provider. G-tube is clogged.  Cause: Thick formula or medication.  Solutions: Try to slowly push warm water into the tube with a large syringe. Never try to push any object into the tube to unclog it. Do not force fluid into the G-tube. If you are unable to unclog the tube,  call your health care provider right away. TIPS  Head of bed (HOB) position refers to the upright position of a person's upper body.  When giving medications or a feeding bolus, keep the [facility] up as told by your health care provider. Do this during the feeding and for 1 hour after the feeding or medication administration.  If continuous feedings are being given, it is best to keep the [facility] up as told by your health care provider. When ADLs (activities of daily living) are performed and the [facility] needs to be flat, be sure to turn the feeding pump off. Restart the feeding pump when the [facility] is returned to the recommended height.  Do not pull or put tension on the tube.  To prevent fluid backflow, kink the G-tube before removing the cap or disconnecting a syringe.  Check the G-tube length every day. Measure from the insertion site to the end of  the G-tube. If the length is longer than previous measurements, the tube may be coming out. Call your health care provider if you notice increasing G-tube length.  Oral care, such as brushing teeth, must be continued.  You may need to remove excess air (vent) from the G-tube. Your health care provider will tell you if this is needed.  Always call your health care provider if you have questions or problems with the G-tube. SEEK IMMEDIATE MEDICAL CARE IF:   You have severe abdominal pain, tenderness, or abdominal bloating (distension).  You have nausea or vomiting.  You are constipated or have problems moving your bowels.  The G-tube insertion site is red, swollen, has a foul smell, or has yellow or brown drainage.  You have difficulty breathing or shortness of breath.  You have a fever.  You have a large amount of feeding tube residuals.  The G-tube is clogged and cannot be flushed. MAKE SURE YOU:   Understand these instructions.  Will watch your condition.  Will get help right away if you are not doing well or get worse.   This information is not intended to replace advice given to you by your health care provider. Make sure you discuss any questions you have with your health care provider.   Document Released: 04/05/2001 Document Revised: 06/11/2014 Document Reviewed: 10/02/2012 Elsevier Interactive Patient Education 2016 [employer] Inc[employer]  Moderate Conscious Sedation, Adult, Care After Refer to this sheet in the next few weeks. These instructions provide you with information on caring for yourself after your procedure. Your health care provider may also give you more specific instructions. Your treatment has been planned according to current medical practices, but problems sometimes occur. Call your health care provider if you have any problems or questions after your procedure. WHAT TO EXPECT AFTER THE PROCEDURE  After your procedure:  You may feel sleepy, clumsy, and have  poor balance for several hours.  Vomiting may occur if you eat too soon after the procedure. HOME CARE INSTRUCTIONS  Do not participate in any activities where you could become injured for at least 24 hours. Do not:  Drive.  Swim.  Ride a bicycle.  Operate heavy machinery.  Cook.  Use power tools.  Climb ladders.  Work from a high place.  Do not make important decisions or sign legal documents until you are improved.  If you vomit, drink water, juice, or soup when you can drink without vomiting. Make sure you have little or no nausea before eating solid foods.  Only take over-the-counter or prescription medicines for pain,  discomfort, or fever as directed by your health care provider.  Make sure you and your family fully understand everything about the medicines given to you, including what side effects may occur.  You should not drink alcohol, take sleeping pills, or take medicines that cause drowsiness for at least 24 hours.  If you smoke, do not smoke without supervision.  If you are feeling better, you may resume normal activities 24 hours after you were sedated.  Keep all appointments with your health care provider. SEEK MEDICAL CARE IF:  Your skin is pale or bluish in color.  You continue to feel nauseous or vomit.  Your pain is getting worse and is not helped by medicine.  You have bleeding or swelling.  You are still sleepy or feeling clumsy after 24 hours. SEEK IMMEDIATE MEDICAL CARE IF:  You develop a rash.  You have difficulty breathing.  You develop any type of allergic problem.  You have a fever. MAKE SURE YOU:  Understand these instructions.  Will watch your condition.  Will get help right away if you are not doing well or get worse.   This information is not intended to replace advice given to you by your health care provider. Make sure you discuss any questions you have with your health care provider.   Document Released: 11/15/2012  Document Revised: 02/15/2014 Document Reviewed: 11/15/2012 Elsevier Interactive Patient Education [employer].

## 2015-11-13 NOTE — Patient Instructions (Signed)

## 2015-11-13 NOTE — Progress Notes (Signed)
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 Biopsy recommended. No definite pharyngeal mass lesion identified by CT.       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 No other primary or metastatic cancer. 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 He is seen today at the infusion area for symptom management He has persistent dysgeusia, dry mouth, lack of appetite and no oral intake since Friday last week He had one episode of bilious vomiting yesterday He denies constipation. He has multiple bowel movement since Monday He denies dizziness or lightheadedness He has not checked his blood sugar for several days He denies pain  REVIEW OF SYSTEMS:    Constitutional: Denies fevers, chills  Eyes: Denies blurriness of vision Respiratory: Denies cough, dyspnea or wheezes Cardiovascular: Denies palpitation, chest discomfort or lower extremity swelling Skin: Denies abnormal skin rashes Lymphatics: Denies new lymphadenopathy or easy bruising Neurological:Denies numbness, tingling or new weaknesses Behavioral/Psych: Mood is stable, no new changes  All other systems were reviewed with the patient and are negative.  I have reviewed the past medical history, past surgical history, social history and family history with the patient and they are unchanged from previous note.  ALLERGIES:  is allergic to no known allergies.  MEDICATIONS:  Current Outpatient Prescriptions  Medication Sig Dispense Refill  . clopidogrel (PLAVIX) 75 MG tablet Take 1 tablet (75 mg total) by mouth daily. 90 tablet 0  . fentaNYL (DURAGESIC - DOSED MCG/HR) 25 MCG/HR patch Place 1 patch (25 mcg total) onto the skin every 3 (three) days. 5 patch 0  . fluticasone (FLONASE) 50 MCG/ACT nasal spray Place 2 sprays into both nostrils daily.    [name] HYDROcodone-acetaminophen (HYCET) 7.5-325 mg/15 ml solution Take 10-57mL every 4 hours as needed for pain. 500 mL 0  . insulin aspart protamine- aspart (NOVOLOG MIX 70/30) (70-30) 100 UNIT/ML injection Inject 10-20 Units into the skin daily with supper. 1 unit before large meal. Per sliding scale    . insulin glargine (LANTUS) 100 UNIT/ML injection Inject 100 Units into the skin daily.     [name] lidocaine (XYLOCAINE) 2 % solution Patient: Mix 1part 2% viscous lidocaine,  1part H20. Swish and swallow 67mL of this mixture, 36min before meals and at bedtime, up to QID 100 mL 5  . ondansetron (ZOFRAN) 8 MG tablet Take 1 tablet (8 mg total) by mouth every 8 (eight) hours as needed for nausea. 60 tablet 3  . prochlorperazine (COMPAZINE) 10 MG tablet Take 1 tablet (10 mg total) by mouth every 6 (six) hours as needed. 60 tablet 3  . scopolamine  (TRANSDERM-SCOP) 1 MG/3DAYS Place 1 patch (1.5 mg total) onto the skin every 3 (three) days. 10 patch 0  . sodium fluoride (FLUORISHIELD) 1.1 % GEL dental gel Instill one drop of gel per tooth space of fluoride tray. Place over teeth for 5 minutes. Remove. Spit out excess. Repeat nightly. 120 mL 11  . sucralfate (CARAFATE) 1 g tablet Dissolve 1 tablet in 10 mL H20 and swallow up to QID for sore throat. 48 tablet 5   No current facility-administered medications for this visit.     PHYSICAL EXAMINATION: ECOG PERFORMANCE STATUS: 2 - Symptomatic, <50% confined to bed  Vitals:   11/13/15 0837 11/13/15 0924  BP: (!) 155/66 (!) 155/66  Pulse: (!) 58 (!) 58  Resp: 16 16  Temp: 97.8 F (36.6 C) 97.8 F (36.6 C)   There were no vitals filed for this visit.  GENERAL:alert, no distress and comfortable SKIN:He has ulcerated appearance on his neck EYES: normal, Conjunctiva are pink and non-injected, sclera clear OROPHARYNX:noted dry lips and mucous membrane. No thrush NECK: supple, thyroid normal size, non-tender, without nodularity LYMPH:  Lymphadenopathy has regressed in size LUNGS: clear to auscultation and percussion with normal breathing effort HEART: regular rate & rhythm and no murmurs and no lower extremity edema ABDOMEN:abdomen soft, non-tender and normal bowel sounds Musculoskeletal:no cyanosis of digits and no clubbing  NEURO: alert & oriented x 3 with fluent speech, no focal motor/sensory deficits  LABORATORY DATA:  I have reviewed the data as listed    Component Value Date/Time   NA 145 11/13/2015 0922   K 3.7 11/13/2015 0922   CL 102 09/15/2015 1345   CO2 23 11/13/2015 0922   GLUCOSE 40 (LL) 11/13/2015 0922   BUN 18.9 11/13/2015 0922   CREATININE 0.9 11/13/2015 0922   CALCIUM 9.1 11/13/2015 0922   PROT 6.6 11/13/2015 0922   ALBUMIN 3.2 (L) 11/13/2015 0922   AST 17 11/13/2015 0922   ALT 10 11/13/2015 0922   ALKPHOS 90 11/13/2015 0922   BILITOT 0.71 11/13/2015 0922    GFRNONAA >60 09/15/2015 1345   GFRAA >60 09/15/2015 1345    No results found for: SPEP, UPEP  Lab Results  Component Value Date   WBC 9.3 11/13/2015   NEUTROABS 8.4 (H) 11/13/2015   HGB 14.6 11/13/2015   HCT 44.5 11/13/2015   MCV 84.5 11/13/2015   PLT 155 11/13/2015      Chemistry      Component Value Date/Time   NA 145 11/13/2015 0922   K 3.7 11/13/2015 0922   CL 102 09/15/2015 1345   CO2 23 11/13/2015 0922   BUN 18.9 11/13/2015 0922   CREATININE 0.9 11/13/2015 0922   GLU 78 11/13/2015 1134      Component Value Date/Time   CALCIUM 9.1 11/13/2015 0922   ALKPHOS 90 11/13/2015 0922   AST 17 11/13/2015 0922   ALT 10 11/13/2015 0922   BILITOT 0.71 11/13/2015 0922       RADIOGRAPHIC STUDIES: I have personally reviewed the radiological images as listed and agreed with the findings in  the report. Ct Abdomen Wo Contrast  Result Date: 11/11/2015 CLINICAL DATA:  Pre gastrostomy tube planning.  Tongue cancer. EXAM: CT ABDOMEN WITHOUT CONTRAST TECHNIQUE: Multidetector CT imaging of the abdomen was performed following the standard protocol without IV contrast. COMPARISON:  PET/CT 08/29/2015 FINDINGS: Lower chest: Coronary artery atherosclerotic calcification is present. No pulmonary nodules or pleural effusion. No visible pericardial effusion. Hepatobiliary: Normal hepatic size and contours. No perihepatic ascites. No intra- or extrahepatic biliary dilatation. Multiple small stones are seen within the gallbladder fundus. Pancreas: There is moderate atrophy of the pancreas. No peripancreatic fluid collection. Spleen: Normal. Adrenals/Urinary Tract: Normal adrenal glands. Right renal cyst measures 4.9 cm. Nonobstructing left renal calculus measures 2 mm. No hydronephrosis. No proximal ureteral abnormality. Mild bilateral perinephric stranding, likely senescent. Stomach/Bowel: A portion of the transverse colon is directly anterolateral to the greater curvature of the stomach. There is no  dilated bowel or evidence of colitis/enteritis. Vascular/Lymphatic: Moderate atherosclerotic calcification of the abdominal aorta. No aneurysm. No abdominal adenopathy. Musculoskeletal: Multilevel thoracic osteophytosis and lumbar facet arthrosis. No bony spinal canal stenosis. No lytic or blastic lesions. Normal visualized extrathoracic and extraperitoneal soft tissues. Other: No contributory non-categorized findings. IMPRESSION: 1. A portion of the transverse colon travels anterior to the stomach in the left upper quadrant. 2. Aortic and coronary artery atherosclerosis. 3. Choletlithiasis without evidence of acute cholecystitis. 4. Nonobstructing left nephrolithiasis. Electronically Signed   By: Ulyses Jarred M.D.   On: 11/11/2015 21:11     ASSESSMENT & PLAN:  Tongue cancer ([place]) The patient continues to struggle with disguesia, dehydration, lack of appetite and poor oral intake as a consequence of side-effects pf treatment His radiation oncologist as scheduled for feeding tube placement. I will continue to see him on a weekly basis for supportive care  Diabetes mellitus with diabetic neuropathy, with long-term current use of insulin ([place]) I am concerned about risk of hypoglycemia due to poor oral intake. Recheck blood sugar this morning shows severe hypoglycemia. The patient is given D50 with good response to treatment. 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 It is regressing in size I recommend clean gauze application if it start to discharge  Nausea without vomiting He has persistent nausea, likely multifactorial related to mucositis, side effects of his prescription pain medicine and constipation. This is despite prescription Zofran and Compazine when necessary. I will start him on daily IVF and IV anti-emetics I have added  scopolamine patch on 11/12/15  Weight loss He has progressive weight loss due to dysgeusia and nausea His radiation oncologist has recommended placement of feeding tube I think it is a good idea He will continue close follow-up with nutritionist  Protein-calorie malnutrition, moderate ([place]) This is related to his poor oral intake and nausea. He will have feeding tube placement today. The patient would be at risk of refeeding syndrome. He follows with dietitian closely   No orders of the defined types were placed in this encounter.  All questions were answered. The patient knows to call the clinic with any problems, questions or concerns. No barriers to learning was detected. I spent 25 minutes counseling the patient face to face. The total time spent in the appointment was 40 minutes and more than 50% was on counseling and review of test results     Heath Lark, MD 11/13/2015 12:04 PM

## 2015-11-14 ENCOUNTER — Ambulatory Visit: Payer: Medicare Other | Admitting: Nutrition

## 2015-11-14 ENCOUNTER — Ambulatory Visit (HOSPITAL_BASED_OUTPATIENT_CLINIC_OR_DEPARTMENT_OTHER): Payer: Medicare Other | Admitting: Nurse Practitioner

## 2015-11-14 ENCOUNTER — Other Ambulatory Visit: Payer: Self-pay | Admitting: Hematology and Oncology

## 2015-11-14 ENCOUNTER — Ambulatory Visit
Admission: RE | Admit: 2015-11-14 | Discharge: 2015-11-14 | Disposition: A | Discharge status: Home / Self Care | Payer: Medicare Other | Source: Ambulatory Visit | Attending: Radiation Oncology | Admitting: Radiation Oncology

## 2015-11-14 ENCOUNTER — Encounter: Payer: Self-pay | Admitting: *Deleted

## 2015-11-14 VITALS — BP 159/65 | HR 64 | Temp 97.5°F | Resp 18 | Ht 68.0 in | Wt 176.3 lb

## 2015-11-14 DIAGNOSIS — R110 Nausea: Secondary | ICD-10-CM

## 2015-11-14 DIAGNOSIS — C01 Malignant neoplasm of base of tongue: Secondary | ICD-10-CM

## 2015-11-14 DIAGNOSIS — Z510 Encounter for antineoplastic radiation therapy: Secondary | ICD-10-CM | POA: Diagnosis not present

## 2015-11-14 DIAGNOSIS — E440 Moderate protein-calorie malnutrition: Secondary | ICD-10-CM

## 2015-11-14 MED ORDER — PROCHLORPERAZINE EDISYLATE 5 MG/ML IJ SOLN
INTRAMUSCULAR | Status: AC
  Filled 2015-11-14: qty 2

## 2015-11-14 MED ORDER — SODIUM CHLORIDE 0.9 % IV SOLN
Freq: Once | INTRAVENOUS | Status: AC
  Administered 2015-11-14: 11:00:00 via INTRAVENOUS

## 2015-11-14 MED ORDER — PROCHLORPERAZINE EDISYLATE 5 MG/ML IJ SOLN
10.0000 mg | Freq: Once | INTRAMUSCULAR | Status: AC
  Administered 2015-11-14: 10 mg via INTRAVENOUS

## 2015-11-14 NOTE — Progress Notes (Signed)
Nutrition follow up completed with patient and wife for TF education. Peg placed yesterday and is ok to use today. Weight documented as 176.3 pounds.  Estimated Nutrition Needs: 2000-2200 kcal, 110-125 grams protein, greater than 2.2 L fluid.  Nutrition Diagnosis: Inadequate oral intake continues.  Intervention: Patient and wife successfully demonstrated bolus TF. All education was reviewed and copy of TF advancement was provided. Patient is being monitored for refeeding syndrome and has CMET, magnesium and phosphorus scheduled twice next week.  Patient will use Osmolite 1.5 and begin with 1/2 can QID with 60 cc free water before and after bolus feeding. Day 2, advance to 1 can BID and 1/2 can BID. Day 3, advance to 1 can TID and 1/2 can once daily Day 4, advance to 1 can QID Day 5, advance to 1 can BID and 1.5 cans BID. Day 6 advance to goal of 1.5 cans QID. In addition, patient will flush feeding tube with 240 cc free water TID between feedings.  Monitoring, Evaluation, Goals: Patient will tolerate TF advancement to goal rate without signs of refeeding to meet greater than 90% estimated needs.  Next Visit: Wednesday, October 11.

## 2015-11-14 NOTE — Patient Instructions (Signed)

## 2015-11-14 NOTE — Progress Notes (Signed)
Pt stated he checked his blood sugar this am and it was 87.  No intervention was needed.

## 2015-11-15 ENCOUNTER — Ambulatory Visit (HOSPITAL_BASED_OUTPATIENT_CLINIC_OR_DEPARTMENT_OTHER): Payer: Medicare Other

## 2015-11-15 VITALS — BP 151/69 | HR 70 | Temp 98.3°F | Resp 18

## 2015-11-15 DIAGNOSIS — R110 Nausea: Secondary | ICD-10-CM | POA: Diagnosis present

## 2015-11-15 DIAGNOSIS — C01 Malignant neoplasm of base of tongue: Secondary | ICD-10-CM

## 2015-11-15 MED ORDER — ONDANSETRON HCL 40 MG/20ML IJ SOLN
Freq: Once | INTRAMUSCULAR | Status: AC
  Administered 2015-11-15: 08:00:00 via INTRAVENOUS

## 2015-11-15 MED ORDER — SODIUM CHLORIDE 0.9 % IV SOLN
Freq: Once | INTRAVENOUS | Status: AC
  Administered 2015-11-15: 08:00:00 via INTRAVENOUS

## 2015-11-17 ENCOUNTER — Encounter: Payer: Self-pay | Admitting: Hematology and Oncology

## 2015-11-17 ENCOUNTER — Other Ambulatory Visit (HOSPITAL_COMMUNITY)
Admission: AD | Admit: 2015-11-17 | Discharge: 2015-11-17 | Disposition: A | Discharge status: Home / Self Care | Payer: Medicare Other | Source: Ambulatory Visit | Attending: Hematology and Oncology | Admitting: Hematology and Oncology

## 2015-11-17 ENCOUNTER — Encounter: Payer: Self-pay | Admitting: Radiation Oncology

## 2015-11-17 ENCOUNTER — Telehealth: Payer: Self-pay | Admitting: *Deleted

## 2015-11-17 ENCOUNTER — Ambulatory Visit
Admission: RE | Admit: 2015-11-17 | Discharge: 2015-11-17 | Disposition: A | Discharge status: Home / Self Care | Payer: Medicare Other | Source: Ambulatory Visit | Attending: Radiation Oncology | Admitting: Radiation Oncology

## 2015-11-17 ENCOUNTER — Other Ambulatory Visit (HOSPITAL_BASED_OUTPATIENT_CLINIC_OR_DEPARTMENT_OTHER): Payer: Medicare Other

## 2015-11-17 ENCOUNTER — Ambulatory Visit (HOSPITAL_BASED_OUTPATIENT_CLINIC_OR_DEPARTMENT_OTHER): Payer: Medicare Other | Admitting: Hematology and Oncology

## 2015-11-17 VITALS — BP 129/74 | HR 86 | Temp 98.1°F | Ht 68.0 in | Wt 174.4 lb

## 2015-11-17 DIAGNOSIS — K1233 Oral mucositis (ulcerative) due to radiation: Secondary | ICD-10-CM | POA: Diagnosis not present

## 2015-11-17 DIAGNOSIS — Z510 Encounter for antineoplastic radiation therapy: Secondary | ICD-10-CM | POA: Diagnosis not present

## 2015-11-17 DIAGNOSIS — Z931 Gastrostomy status: Secondary | ICD-10-CM | POA: Diagnosis not present

## 2015-11-17 DIAGNOSIS — E860 Dehydration: Secondary | ICD-10-CM

## 2015-11-17 DIAGNOSIS — Z794 Long term (current) use of insulin: Secondary | ICD-10-CM | POA: Diagnosis not present

## 2015-11-17 DIAGNOSIS — C770 Secondary and unspecified malignant neoplasm of lymph nodes of head, face and neck: Secondary | ICD-10-CM

## 2015-11-17 DIAGNOSIS — R432 Parageusia: Secondary | ICD-10-CM

## 2015-11-17 DIAGNOSIS — E1140 Type 2 diabetes mellitus with diabetic neuropathy, unspecified: Secondary | ICD-10-CM | POA: Diagnosis not present

## 2015-11-17 DIAGNOSIS — E440 Moderate protein-calorie malnutrition: Secondary | ICD-10-CM | POA: Diagnosis not present

## 2015-11-17 DIAGNOSIS — C01 Malignant neoplasm of base of tongue: Secondary | ICD-10-CM | POA: Diagnosis present

## 2015-11-17 DIAGNOSIS — C029 Malignant neoplasm of tongue, unspecified: Secondary | ICD-10-CM

## 2015-11-17 DIAGNOSIS — R630 Anorexia: Secondary | ICD-10-CM

## 2015-11-17 DIAGNOSIS — R110 Nausea: Secondary | ICD-10-CM

## 2015-11-17 LAB — PHOSPHORUS: PHOSPHORUS: 2.5 mg/dL (ref 2.5–4.6)

## 2015-11-17 LAB — COMPREHENSIVE METABOLIC PANEL
ALT: 9 U/L (ref 0–55)
ANION GAP: 10 meq/L (ref 3–11)
AST: 13 U/L (ref 5–34)
Albumin: 3 g/dL — ABNORMAL LOW (ref 3.5–5.0)
Alkaline Phosphatase: 82 U/L (ref 40–150)
BILIRUBIN TOTAL: 0.67 mg/dL (ref 0.20–1.20)
BUN: 12.1 mg/dL (ref 7.0–26.0)
CHLORIDE: 101 meq/L (ref 98–109)
CO2: 29 meq/L (ref 22–29)
Calcium: 9.6 mg/dL (ref 8.4–10.4)
Creatinine: 1 mg/dL (ref 0.7–1.3)
EGFR: 72 mL/min/{1.73_m2} — AB (ref >90)
Glucose: 231 mg/dl — ABNORMAL HIGH (ref 70–140)
POTASSIUM: 3.5 meq/L (ref 3.5–5.1)
Sodium: 140 mEq/L (ref 136–145)
Total Protein: 6.7 g/dL (ref 6.4–8.3)

## 2015-11-17 LAB — MAGNESIUM: Magnesium: 1.6 mg/dl (ref 1.5–2.5)

## 2015-11-17 NOTE — Assessment & Plan Note (Signed)
I am concerned about risk of hypoglycemia due to poor oral intake. According to the patient, he has omitted insulin treatment recently until he can get consistent oral intake. I continue to reinforced the importance of checking his blood sugar regularly

## 2015-11-17 NOTE — Assessment & Plan Note (Signed)
This is related to his poor oral intake and nausea. He will have feeding tube placement last week The patient would be at risk of refeeding syndrome. So far, his labs are stable He follows with dietitian closely He will continue to take small amount of nutritional feeding as directed

## 2015-11-17 NOTE — Assessment & Plan Note (Signed)
The malignant lymph node had some ulcerated appearance The skin is not broken. It is regressing in size I recommend clean gauze application if it start to discharge

## 2015-11-17 NOTE — Assessment & Plan Note (Signed)
His recent feeding tube placement was uneventful. The site looks clean without signs of infection

## 2015-11-17 NOTE — Progress Notes (Signed)
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 Biopsy recommended. No definite pharyngeal mass lesion identified by CT.       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 No other primary or metastatic cancer. 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       Cancer of base of tongue ([place])    INTERVAL HISTORY: Please see below for problem oriented charting. He is seen today with his wife. The patient has blamed his wife in front of me, numerous times for causing his postprandial bloating. He was given the prescribed regimen between fluid flushes and nutritional feeding by the dietitian. According to the patient, he has been given to feeding incorrectly and that  in turn causes some postprandial bloating and vomiting this past weekend. Yesterday, he was able to only tolerate 2 cans of nutritional supplement. A big cause of his nausea and vomiting originate from mucus gagging at the back of his throat and postprandial bloating. He denies constipation. He had 2 bowel movement yesterday. He has not resumed his insulin intake due to recent hypoglycemic episodes. According to him, his blood sugar this morning was 120. The patient has not resume his Plavix since the feeding tube placement. His felt that his throat pain appears to be under good control. He denies dizziness or lightheadedness today despite mild orthostasis documented  REVIEW OF SYSTEMS:   Constitutional: Denies fevers, chills  Eyes: Denies blurriness of vision Respiratory: Denies cough, dyspnea or wheezes Cardiovascular: Denies palpitation, chest discomfort or lower extremity swelling Lymphatics: Denies new lymphadenopathy or easy bruising Neurological:Denies numbness, tingling or new weaknesses Behavioral/Psych: Mood is stable, no new changes  All other systems were reviewed with the patient and are negative.  I have reviewed the past medical history, past surgical history, social history and family history with the patient and they are unchanged from previous note.  ALLERGIES:  is allergic to no known allergies.  MEDICATIONS:  Current Outpatient Prescriptions  Medication Sig Dispense Refill  . clopidogrel (PLAVIX) 75 MG tablet Take 1 tablet (75 mg total) by mouth daily. (Patient not taking: Reported on 11/17/2015) 90 tablet 0  . fentaNYL (DURAGESIC - DOSED MCG/HR) 25 MCG/HR patch  Place 1 patch (25 mcg total) onto the skin every 3 (three) days. 5 patch 0  . fluticasone (FLONASE) 50 MCG/ACT nasal spray Place 2 sprays into both nostrils daily.    [name] HYDROcodone-acetaminophen (HYCET) 7.5-325 mg/15 ml solution Take 10-42mL every 4 hours as needed for pain. 500 mL 0  . insulin aspart  protamine- aspart (NOVOLOG MIX 70/30) (70-30) 100 UNIT/ML injection Inject 10-20 Units into the skin daily with supper. 1 unit before large meal. Per sliding scale    . insulin glargine (LANTUS) 100 UNIT/ML injection Inject 100 Units into the skin daily.     [name] lidocaine (XYLOCAINE) 2 % solution Patient: Mix 1part 2% viscous lidocaine, 1part H20. Swish and swallow 66mL of this mixture, 23min before meals and at bedtime, up to QID (Patient not taking: Reported on 11/17/2015) 100 mL 5  . ondansetron (ZOFRAN) 8 MG tablet Take 1 tablet (8 mg total) by mouth every 8 (eight) hours as needed for nausea. (Patient not taking: Reported on 11/17/2015) 60 tablet 3  . prochlorperazine (COMPAZINE) 10 MG tablet Take 1 tablet (10 mg total) by mouth every 6 (six) hours as needed. (Patient not taking: Reported on 11/17/2015) 60 tablet 3  . scopolamine (TRANSDERM-SCOP) 1 MG/3DAYS Place 1 patch (1.5 mg total) onto the skin every 3 (three) days. 10 patch 0  . senna (SENOKOT) 8.6 MG tablet Take 1 tablet by mouth daily as needed for constipation.    . sodium fluoride (FLUORISHIELD) 1.1 % GEL dental gel Instill one drop of gel per tooth space of fluoride tray. Place over teeth for 5 minutes. Remove. Spit out excess. Repeat nightly. 120 mL 11  . sucralfate (CARAFATE) 1 g tablet Dissolve 1 tablet in 10 mL H20 and swallow up to QID for sore throat. (Patient not taking: Reported on 11/17/2015) 48 tablet 5   No current facility-administered medications for this visit.     PHYSICAL EXAMINATION: ECOG PERFORMANCE STATUS: 1 - Symptomatic but completely ambulatory  Vitals:   11/17/15 1206  BP: (!) 148/67  Pulse: 74  Resp: 18  Temp: 98.2 F (36.8 C)   Filed Weights   11/17/15 1206  Weight: 174 lb 11.2 oz (79.2 kg)    GENERAL:alert, no distress and comfortable SKIN: The ulcerated lymph node on his neck is stable. He had mild skin rash around his neck from radiation EYES: normal, Conjunctiva are pink and non-injected, sclera  clear OROPHARYNX:no exudate, no erythema and lips, buccal mucosa, and tongue normal  ABDOMEN:abdomen soft, non-tender and normal bowel sounds. Feeding tube site looks okay Musculoskeletal:no cyanosis of digits and no clubbing  NEURO: alert & oriented x 3 with fluent speech, no focal motor/sensory deficits  LABORATORY DATA:  I have reviewed the data as listed    Component Value Date/Time   NA 140 11/17/2015 1154   K 3.5 11/17/2015 1154   CL 102 09/15/2015 1345   CO2 29 11/17/2015 1154   GLUCOSE 231 (H) 11/17/2015 1154   BUN 12.1 11/17/2015 1154   CREATININE 1.0 11/17/2015 1154   CALCIUM 9.6 11/17/2015 1154   PROT 6.7 11/17/2015 1154   ALBUMIN 3.0 (L) 11/17/2015 1154   AST 13 11/17/2015 1154   ALT 9 11/17/2015 1154   ALKPHOS 82 11/17/2015 1154   BILITOT 0.67 11/17/2015 1154   GFRNONAA >60 09/15/2015 1345   GFRAA >60 09/15/2015 1345    No results found for: SPEP, UPEP  Lab Results  Component Value Date   WBC 9.3 11/13/2015   NEUTROABS 8.4 (H)  11/13/2015   HGB 14.6 11/13/2015   HCT 44.5 11/13/2015   MCV 84.5 11/13/2015   PLT 155 11/13/2015      Chemistry      Component Value Date/Time   NA 140 11/17/2015 1154   K 3.5 11/17/2015 1154   CL 102 09/15/2015 1345   CO2 29 11/17/2015 1154   BUN 12.1 11/17/2015 1154   CREATININE 1.0 11/17/2015 1154   GLU 78 11/13/2015 1134      Component Value Date/Time   CALCIUM 9.6 11/17/2015 1154   ALKPHOS 82 11/17/2015 1154   AST 13 11/17/2015 1154   ALT 9 11/17/2015 1154   BILITOT 0.67 11/17/2015 1154     ASSESSMENT & PLAN:  Tongue cancer ([place]) The patient continues to struggle with disguesia, dehydration, lack of appetite and poor oral intake as a consequence of side-effects of treatment I will continue to see him again later this week for symptom management  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 It is regressing in size I recommend clean  gauze application if it start to discharge  Diabetes mellitus with diabetic neuropathy, with long-term current use of insulin ([place]) I am concerned about risk of hypoglycemia due to poor oral intake. According to the patient, he has omitted insulin treatment recently until he can get consistent oral intake. I continue to reinforced the importance of checking his blood sugar regularly  S/P gastrostomy ([place]) His recent feeding tube placement was uneventful. The site looks clean without signs of infection  Protein-calorie malnutrition, moderate ([place]) This is related to his poor oral intake and nausea. He will have feeding tube placement last week The patient would be at risk of refeeding syndrome. So far, his labs are stable He follows with dietitian closely He will continue to take small amount of nutritional feeding as directed  Nausea without vomiting He has persistent nausea, likely multifactorial related to mucositis, side effects of his prescription pain medicine, mucus gagging and post-prandial bloating. Last week, his symptoms are stable on daily IVF and IV anti-emetics I have added scopolamine patch on 11/12/15 and this is stable He has mild orthostasis with significant difference in blood pressure drop but  denies symptoms of dizziness The patient felt strongly that he does not want IV fluids today.  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 He was prescribed local medication with Carafate, fentanyl, hycet and lidocaine by his radiation oncologist. I recommend he continues the same   No orders of the defined types were placed in this encounter.  All questions were answered. The patient knows to call the clinic with any problems, questions or concerns. No barriers to learning was detected. I spent 20 minutes counseling the patient face  to face. The total time spent in the appointment was 25 minutes and more than 50% was on counseling and review of test results     Heath Lark, MD 11/17/2015 1:03 PM

## 2015-11-17 NOTE — Assessment & Plan Note (Signed)
The patient continues to struggle with disguesia, dehydration, lack of appetite and poor oral intake as a consequence of side-effects of treatment I will continue to see him again later this week for symptom management

## 2015-11-17 NOTE — Assessment & Plan Note (Signed)
He has persistent nausea, likely multifactorial related to mucositis, side effects of his prescription pain medicine, mucus gagging and post-prandial bloating. Last week, his symptoms are stable on daily IVF and IV anti-emetics I have added scopolamine patch on 11/12/15 and this is stable He has mild orthostasis with significant difference in blood pressure drop but  denies symptoms of dizziness The patient felt strongly that he does not want IV fluids today.  I encouraged the patient to call if he feels he would need IV fluids again in the near future

## 2015-11-17 NOTE — Progress Notes (Signed)
   Weekly Management Note:  Outpatient    ICD-9-CM ICD-10-CM   1. Cancer of base of tongue (HCC) 141.0 C01     Current Dose:  64 Gy  Projected Dose: 70 Gy   Narrative:  The patient presents for routine under treatment assessment.  CBCT/MVCT images/Port film x-rays were reviewed.  The chart was checked. Mr. Benjamin Barr presents for his 32nd fraction of radiation to his Oropharynx and bilateral neck.   Reports stable fatigue.  Foam in mouth.  Poor taste.  Tolerates osmolite 2.5 cans daily in PEG.  Drinks and flushes water.   Physical Findings:  height is 5\' 8"  (1.727 m) and weight is 174 lb 6.4 oz (79.1 kg). His oral temperature is 98.1 F (36.7 C). His blood pressure is 129/74 and his pulse is 86. His oxygen saturation is 98%.   Wt Readings from Last 3 Encounters:  11/17/15 174 lb 11.2 oz (79.2 kg)  11/17/15 174 lb 6.4 oz (79.1 kg)  11/14/15 176 lb 4.8 oz (80 kg)  confluent mucositis with thrush over his palette . Skin over his neck is erythematous. Protuberant nodule in right lower neck has regressed further and is almost gone.  Skin with dry desquamation and some moistness at nodular area  CMP     Component Value Date/Time   NA 145 11/13/2015 0922   K 3.7 11/13/2015 0922   CL 102 09/15/2015 1345   CO2 23 11/13/2015 0922   GLUCOSE 40 (LL) 11/13/2015 0922   BUN 18.9 11/13/2015 0922   CREATININE 0.9 11/13/2015 0922   CALCIUM 9.1 11/13/2015 0922   PROT 6.6 11/13/2015 0922   ALBUMIN 3.2 (L) 11/13/2015 0922   AST 17 11/13/2015 0922   ALT 10 11/13/2015 0922   ALKPHOS 90 11/13/2015 0922   BILITOT 0.71 11/13/2015 0922   GFRNONAA >60 09/15/2015 1345   GFRAA >60 09/15/2015 1345    Impression:  The patient is tolerating radiotherapy   Plan:  Continue radiotherapy as planned.  Continue using PEG.  F/u in 3 wks, continue supportive care w/ med onc ________________________________   Eppie Gibson, M.D.  This document serves as a record of services personally performed by Eppie Gibson, MD. It was created on her behalf by Bethann Humble, a trained medical scribe. The creation of this record is based on the scribe's personal observations and the provider's statements to them. This document has been checked and approved by the attending provider.

## 2015-11-17 NOTE — Telephone Encounter (Signed)
Informed pt of Dr. Calton Dach message regarding lab results.  He verbalized understanding.

## 2015-11-17 NOTE — Progress Notes (Signed)
Oncology Nurse Navigator Documentation  Met with Mr. Eckels and his wife while he was receiving IVF in [facility]. Wife asked for my assistance in conducting initial PEG dressing change. I provided her supplies, provided guidance as she changed dressing.  Scant dried blood evident on dressing, no evidence of discharge.  Insertion site without redness. He reported negligible discomfort with PEG. They understand I will provide further PEG management support as needed.  [name], RN, BSN, [place] at [place] [phone]

## 2015-11-17 NOTE — Progress Notes (Signed)
Benjamin Barr has completed 32 fractions to his oropharynx/bilateral neck.  He is reports pain in his throat today at a 5/10.  He is using a fentanyl patch 25 mcg and occasional hycet.  He reports his mouth is dry and has a lot of "foam."  He reports he can't taste anything.  He denies having any mouth sores.  He is drinking a small amount of water and putting 2 1/2 cans of Osmolite through his peg tube plus free water flushes.  He reports his peg tube is working well and does not have any redness or drainage around the insertion site.  The skin on his right neck is red with a peeling area that he is applying neosporin to.  His left neck is red and dry.  He is using sonafine.  He denies having an increase in fatigue.  Orthostatic vitals taken: bp sitting 146/75, hr 77, bp standing 129/74, hr 86.  BP 129/74 (BP Location: Right Arm, Patient Position: Standing)   Pulse 86   Temp 98.1 F (36.7 C) (Oral)   Ht 5\' 8"  (1.727 m)   Wt 174 lb 6.4 oz (79.1 kg)   SpO2 98%   BMI 26.52 kg/m    Wt Readings from Last 3 Encounters:  11/17/15 174 lb 6.4 oz (79.1 kg)  11/14/15 176 lb 4.8 oz (80 kg)  11/12/15 176 lb 6.4 oz (80 kg)

## 2015-11-17 NOTE — Assessment & Plan Note (Signed)
He has stable mucositis pain from radiation treatment. He was prescribed local medication with Carafate, fentanyl, hycet and lidocaine by his radiation oncologist. I recommend he continues the same

## 2015-11-17 NOTE — Telephone Encounter (Signed)
-----   Message from Heath Lark, MD sent at 11/17/2015 12:45 PM EDT ----- Regarding: labs Cameo,  Please call patient and tell him labs are OK so far apart from high glucose only He declined IVF this week Plan to recheck labs again Thursday ----- Message ----- From: Buel Ream, Lab In [place] Sent: 11/17/2015  12:44 PM To: Heath Lark, MD

## 2015-11-18 ENCOUNTER — Ambulatory Visit
Admission: RE | Admit: 2015-11-18 | Discharge: 2015-11-18 | Disposition: A | Discharge status: Home / Self Care | Payer: Medicare Other | Source: Ambulatory Visit | Attending: Radiation Oncology | Admitting: Radiation Oncology

## 2015-11-18 DIAGNOSIS — Z510 Encounter for antineoplastic radiation therapy: Secondary | ICD-10-CM | POA: Diagnosis not present

## 2015-11-19 ENCOUNTER — Ambulatory Visit: Payer: Medicare Other

## 2015-11-19 ENCOUNTER — Ambulatory Visit: Payer: Medicare Other | Admitting: Nutrition

## 2015-11-19 ENCOUNTER — Ambulatory Visit
Admission: RE | Admit: 2015-11-19 | Discharge: 2015-11-19 | Disposition: A | Discharge status: Home / Self Care | Payer: Medicare Other | Source: Ambulatory Visit | Attending: Radiation Oncology | Admitting: Radiation Oncology

## 2015-11-19 VITALS — Wt 174.4 lb

## 2015-11-19 DIAGNOSIS — Z510 Encounter for antineoplastic radiation therapy: Secondary | ICD-10-CM | POA: Diagnosis not present

## 2015-11-19 DIAGNOSIS — C029 Malignant neoplasm of tongue, unspecified: Secondary | ICD-10-CM

## 2015-11-19 MED ORDER — OSMOLITE 1.5 CAL PO LIQD: ORAL | 0 refills | Status: DC

## 2015-11-19 NOTE — Progress Notes (Signed)
Nutrition follow-up completed with patient and wife.  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 Patient reports he is eating nothing by mouth and is not drinking water by mouth either. Weight has decreased slightly and was documented as 174.4 pounds. Patient is using his feeding tube and tolerated a total of 3 cans yesterday. Patient was educated to increase to 4 cans today.  Continue advancement to goal rate of 6 cans daily. Reviewed labs.  Patient is not showing signs of refeeding syndrome at this time.  Estimated nutrition needs: 2000-2200 cal, 110-125 grams protein, greater than 2.2 L fluid.  Nutrition diagnosis: Inadequate oral intake continues.  Intervention: Patient will increase tube feedings to goal rate of Osmolite 1.5 - 1.5 cans 4 times a day with 60 cc free water before and after bolus feedings.  In addition patient will flush feeding tube with 240 cc free water 3 times a day between feedings. Brief education provided on diet advancement once patient able to better swallow. Questions were answered.  Teach back method used.  Monitoring, evaluation, goals: Patient will tolerate tube feeding advancement to goal rate without showing signs of refeeding to meet greater than 90% of estimated needs.  Next visit: Monday, November 13.  **Disclaimer: This note was dictated with voice recognition software. Similar sounding words can inadvertently be transcribed and this note may contain transcription errors which may not have been corrected upon publication of note.**

## 2015-11-19 NOTE — Addendum Note (Signed)
Addended by: Ernestene Kiel L on: 11/19/2015 03:52 PM   Modules accepted: Orders

## 2015-11-20 ENCOUNTER — Other Ambulatory Visit (HOSPITAL_BASED_OUTPATIENT_CLINIC_OR_DEPARTMENT_OTHER): Payer: Medicare Other

## 2015-11-20 ENCOUNTER — Other Ambulatory Visit (HOSPITAL_COMMUNITY)
Admission: RE | Admit: 2015-11-20 | Discharge: 2015-11-20 | Disposition: A | Discharge status: Home / Self Care | Payer: Medicare Other | Source: Ambulatory Visit | Attending: Hematology and Oncology | Admitting: Hematology and Oncology

## 2015-11-20 ENCOUNTER — Encounter: Payer: Self-pay | Admitting: Hematology and Oncology

## 2015-11-20 ENCOUNTER — Ambulatory Visit (HOSPITAL_BASED_OUTPATIENT_CLINIC_OR_DEPARTMENT_OTHER): Payer: Medicare Other | Admitting: Hematology and Oncology

## 2015-11-20 ENCOUNTER — Encounter: Payer: Self-pay | Admitting: Radiation Oncology

## 2015-11-20 ENCOUNTER — Ambulatory Visit
Admission: RE | Admit: 2015-11-20 | Discharge: 2015-11-20 | Disposition: A | Discharge status: Home / Self Care | Payer: Medicare Other | Source: Ambulatory Visit | Attending: Radiation Oncology | Admitting: Radiation Oncology

## 2015-11-20 ENCOUNTER — Telehealth: Payer: Self-pay | Admitting: *Deleted

## 2015-11-20 ENCOUNTER — Encounter: Payer: Self-pay | Admitting: *Deleted

## 2015-11-20 ENCOUNTER — Telehealth: Payer: Self-pay | Admitting: Hematology and Oncology

## 2015-11-20 DIAGNOSIS — C770 Secondary and unspecified malignant neoplasm of lymph nodes of head, face and neck: Secondary | ICD-10-CM | POA: Diagnosis present

## 2015-11-20 DIAGNOSIS — C01 Malignant neoplasm of base of tongue: Secondary | ICD-10-CM

## 2015-11-20 DIAGNOSIS — R432 Parageusia: Secondary | ICD-10-CM | POA: Diagnosis not present

## 2015-11-20 DIAGNOSIS — Z510 Encounter for antineoplastic radiation therapy: Secondary | ICD-10-CM | POA: Diagnosis not present

## 2015-11-20 DIAGNOSIS — E440 Moderate protein-calorie malnutrition: Secondary | ICD-10-CM

## 2015-11-20 DIAGNOSIS — C029 Malignant neoplasm of tongue, unspecified: Secondary | ICD-10-CM

## 2015-11-20 DIAGNOSIS — Z931 Gastrostomy status: Secondary | ICD-10-CM

## 2015-11-20 DIAGNOSIS — Z794 Long term (current) use of insulin: Secondary | ICD-10-CM

## 2015-11-20 DIAGNOSIS — E1140 Type 2 diabetes mellitus with diabetic neuropathy, unspecified: Secondary | ICD-10-CM

## 2015-11-20 LAB — COMPREHENSIVE METABOLIC PANEL
ALBUMIN: 3.3 g/dL — AB (ref 3.5–5.0)
ALK PHOS: 84 U/L (ref 40–150)
ALT: 13 U/L (ref 0–55)
AST: 13 U/L (ref 5–34)
Anion Gap: 11 mEq/L (ref 3–11)
BUN: 10.6 mg/dL (ref 7.0–26.0)
CALCIUM: 9.8 mg/dL (ref 8.4–10.4)
CHLORIDE: 98 meq/L (ref 98–109)
CO2: 29 mEq/L (ref 22–29)
Creatinine: 1 mg/dL (ref 0.7–1.3)
EGFR: 76 mL/min/{1.73_m2} — AB (ref >90)
Glucose: 250 mg/dl — ABNORMAL HIGH (ref 70–140)
POTASSIUM: 3.4 meq/L — AB (ref 3.5–5.1)
SODIUM: 139 meq/L (ref 136–145)
Total Bilirubin: 0.65 mg/dL (ref 0.20–1.20)
Total Protein: 7.1 g/dL (ref 6.4–8.3)

## 2015-11-20 LAB — PHOSPHORUS: Phosphorus: 3.1 mg/dL (ref 2.5–4.6)

## 2015-11-20 LAB — MAGNESIUM: MAGNESIUM: 1.7 mg/dL (ref 1.5–2.5)

## 2015-11-20 NOTE — Progress Notes (Signed)
Oncology Nurse Navigator Documentation  Met with Mr. Benjamin Barr and his wife during final RT to offer support and to celebrate end of radiation treatment.   I provided wife with a Certificate of Recognition for her supportive care. I provided post-RT guidance:  Importance of keeping follow-up appts with Nutrition and SLP.  Importance of protecting treatment area from sun.  Continuation of Sonafine application 2-3 times daily. I explained that my role as navigator will continue for several more months and that I will be calling and/or joining him during follow-up visits.   I encouraged him to call me with needs/concerns.   They verbalized understanding of information provided, expressed appreciation for support provided by all [facility] staff.  Benjamin Orem, RN, BSN, [place] at [place] [phone]

## 2015-11-20 NOTE — Telephone Encounter (Signed)
-----   Message from Heath Lark, MD sent at 11/20/2015 12:10 PM EDT ----- Regarding: labs are fine Pls call him and let him know labs are oK ----- Message ----- From: Interface, Lab In [place] Sent: 11/20/2015  11:49 AM To: Heath Lark, MD

## 2015-11-20 NOTE — Telephone Encounter (Signed)
Informed wife of labs ok per Dr. Alvy Bimler. She verbalized understanding.

## 2015-11-20 NOTE — Assessment & Plan Note (Signed)
The site is clean without signs of infection 

## 2015-11-20 NOTE — Assessment & Plan Note (Signed)
The patient continues to struggle with disguesia, and poor oral intake as a consequence of side-effects of treatment Most of the side effects are stable I reinforced the importance of patient calling me back in the future if he felt he needed IV fluids for hydration or IV antiemetics

## 2015-11-20 NOTE — Telephone Encounter (Signed)
No 10/12 los/orders/referrals °

## 2015-11-20 NOTE — Assessment & Plan Note (Signed)
He is able to tolerate nutritional supplement better without recent nausea or vomiting. He has not lost any weight. Blood work today showed no evidence of refeeding syndrome. Potassium is just mildly reduced at 3.4 but he is not symptomatic and I do not recommend potassium replacement therapy is point

## 2015-11-20 NOTE — Progress Notes (Signed)
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 Biopsy recommended. No definite pharyngeal mass lesion identified by CT.       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 No other primary or metastatic cancer. 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       Cancer of base of tongue ([place])    INTERVAL HISTORY: Please see below for problem oriented charting. He is seen today as part of his weekly supportive care visit. He is able to tolerate 4 cans of nutritional supplement without nausea or vomiting. He has not started using insulin therapy even though some of his postprandial blood sugar was over 200. When I questioned the rationale behind not restarting back  on insulin, he does not appears to understand the rationale of restarting back on insulin because he does not seem too concerned with the mildly elevated postprandial blood sugar. His mucositis pain is stable at current prescription fentanyl patch. He rated his pain as 5 out of 10  REVIEW OF SYSTEMS:   Constitutional: Denies fevers, chills or abnormal weight loss Eyes: Denies blurriness of vision Respiratory: Denies cough, dyspnea or wheezes Cardiovascular: Denies palpitation, chest discomfort or lower extremity swelling Lymphatics: Denies new lymphadenopathy or easy bruising Neurological:Denies numbness, tingling or new weaknesses Behavioral/Psych: Mood is stable, no new changes  All other systems were reviewed with the patient and are negative.  I have reviewed the past medical history, past surgical history, social history and family history with the patient and they are unchanged from previous note.  ALLERGIES:  is allergic to no known allergies.  MEDICATIONS:  Current Outpatient Prescriptions  Medication Sig Dispense Refill  . clopidogrel (PLAVIX) 75 MG tablet Take 1 tablet (75 mg total) by mouth daily. 90 tablet 0  . fentaNYL (DURAGESIC - DOSED MCG/HR) 25 MCG/HR patch Place 1 patch (25 mcg total) onto the skin every 3 (three) days. 5 patch 0  . fluticasone (FLONASE) 50 MCG/ACT nasal spray Place 2 sprays into both nostrils daily.    [name] HYDROcodone-acetaminophen (HYCET) 7.5-325 mg/15 ml solution Take 10-72mL every 4 hours as needed for pain. 500 mL 0  . insulin aspart protamine- aspart (NOVOLOG MIX 70/30) (70-30)  100 UNIT/ML injection Inject 10-20 Units into the skin daily with supper. 1 unit before large meal. Per sliding scale    . insulin glargine (LANTUS) 100 UNIT/ML injection Inject 100 Units into the skin daily.     [name] lidocaine (XYLOCAINE) 2 % solution Patient: Mix 1part 2% viscous lidocaine, 1part H20. Swish and swallow 30mL of this mixture, 89min before meals and at bedtime, up  to QID 100 mL 5  . Nutritional Supplements (FEEDING SUPPLEMENT, OSMOLITE 1.5 CAL,) LIQD Give 1 1/2 cans osmolite 1.5 via PEG QID with 60 cc free water before and after feedings.  In addition, flush tube with 240 cc free water TID between meals. 6 Bottle 0  . ondansetron (ZOFRAN) 8 MG tablet Take 1 tablet (8 mg total) by mouth every 8 (eight) hours as needed for nausea. 60 tablet 3  . prochlorperazine (COMPAZINE) 10 MG tablet Take 1 tablet (10 mg total) by mouth every 6 (six) hours as needed. 60 tablet 3  . scopolamine (TRANSDERM-SCOP) 1 MG/3DAYS Place 1 patch (1.5 mg total) onto the skin every 3 (three) days. 10 patch 0  . senna (SENOKOT) 8.6 MG tablet Take 1 tablet by mouth daily as needed for constipation.    . sodium fluoride (FLUORISHIELD) 1.1 % GEL dental gel Instill one drop of gel per tooth space of fluoride tray. Place over teeth for 5 minutes. Remove. Spit out excess. Repeat nightly. 120 mL 11  . sucralfate (CARAFATE) 1 g tablet Dissolve 1 tablet in 10 mL H20 and swallow up to QID for sore throat. 48 tablet 5   No current facility-administered medications for this visit.     PHYSICAL EXAMINATION: ECOG PERFORMANCE STATUS: 1 - Symptomatic but completely ambulatory  Vitals:   11/20/15 1136  BP: 133/70  Pulse: 69  Resp: 18  Temp: 98.4 F (36.9 C)   Filed Weights   11/20/15 1136  Weight: 174 lb 9 oz (79.2 kg)    GENERAL:alert, no distress and comfortable SKIN: Skin ulceration on his neck is healing EYES: normal, Conjunctiva are pink and non-injected, sclera clear Musculoskeletal:no cyanosis of digits and no clubbing  NEURO: alert & oriented x 3 with fluent speech, no focal motor/sensory deficits  LABORATORY DATA:  I have reviewed the data as listed    Component Value Date/Time   NA 139 11/20/2015 1113   K 3.4 (L) 11/20/2015 1113   CL 102 09/15/2015 1345   CO2 29 11/20/2015 1113   GLUCOSE 250 (H) 11/20/2015 1113   BUN 10.6 11/20/2015 1113   CREATININE 1.0 11/20/2015  1113   CALCIUM 9.8 11/20/2015 1113   PROT 7.1 11/20/2015 1113   ALBUMIN 3.3 (L) 11/20/2015 1113   AST 13 11/20/2015 1113   ALT 13 11/20/2015 1113   ALKPHOS 84 11/20/2015 1113   BILITOT 0.65 11/20/2015 1113   GFRNONAA >60 09/15/2015 1345   GFRAA >60 09/15/2015 1345    No results found for: SPEP, UPEP  Lab Results  Component Value Date   WBC 9.3 11/13/2015   NEUTROABS 8.4 (H) 11/13/2015   HGB 14.6 11/13/2015   HCT 44.5 11/13/2015   MCV 84.5 11/13/2015   PLT 155 11/13/2015      Chemistry      Component Value Date/Time   NA 139 11/20/2015 1113   K 3.4 (L) 11/20/2015 1113   CL 102 09/15/2015 1345   CO2 29 11/20/2015 1113   BUN 10.6 11/20/2015 1113   CREATININE 1.0 11/20/2015 1113   GLU 78 11/13/2015 1134  Component Value Date/Time   CALCIUM 9.8 11/20/2015 1113   ALKPHOS 84 11/20/2015 1113   AST 13 11/20/2015 1113   ALT 13 11/20/2015 1113   BILITOT 0.65 11/20/2015 1113      ASSESSMENT & PLAN:  Tongue cancer ([place]) The patient continues to struggle with disguesia, and poor oral intake as a consequence of side-effects of treatment Most of the side effects are stable I reinforced the importance of patient calling me back in the future if he felt he needed IV fluids for hydration or IV antiemetics  Diabetes mellitus with diabetic neuropathy, with long-term current use of insulin ([place]) The patient is using at least 4 cans of nutritional supplement per day. His blood sugar is consistently over 200. He has not started using his insulin recently. I recommend he restart back on NovoLog and consult with his primary care doctor for insulin adjustment in the near future  S/P gastrostomy [facility]) The site is clean without signs of infection  Protein-calorie malnutrition, moderate ([place]) He is able to tolerate nutritional supplement better without recent nausea or vomiting. He has not lost any weight. Blood work today showed no evidence of refeeding syndrome. Potassium is  just mildly reduced at 3.4 but he is not symptomatic and I do not recommend potassium replacement therapy is point   No orders of the defined types were placed in this encounter.  All questions were answered. The patient knows to call the clinic with any problems, questions or concerns. No barriers to learning was detected. I spent 15 minutes counseling the patient face to face. The total time spent in the appointment was 20 minutes and more than 50% was on counseling and review of test results     Heath Lark, MD 11/20/2015 3:25 PM

## 2015-11-20 NOTE — Assessment & Plan Note (Signed)
The patient is using at least 4 cans of nutritional supplement per day. His blood sugar is consistently over 200. He has not started using his insulin recently. I recommend he restart back on NovoLog and consult with his primary care doctor for insulin adjustment in the near future

## 2015-11-24 ENCOUNTER — Other Ambulatory Visit: Payer: Self-pay | Admitting: Radiation Oncology

## 2015-11-24 ENCOUNTER — Ambulatory Visit: Payer: Medicare Other | Attending: Radiation Oncology

## 2015-11-24 DIAGNOSIS — R1310 Dysphagia, unspecified: Secondary | ICD-10-CM | POA: Diagnosis not present

## 2015-11-24 DIAGNOSIS — C01 Malignant neoplasm of base of tongue: Secondary | ICD-10-CM

## 2015-11-24 MED ORDER — FENTANYL 25 MCG/HR TD PT72: 25.0000 ug | MEDICATED_PATCH | TRANSDERMAL | 0 refills | Status: DC

## 2015-11-24 NOTE — Patient Instructions (Signed)
Continue to perform the exercises but incr the frequency. Do the full recommended amount of reps for exercises where you don't have to swallow, and then add in the repetitions slowly of the exercises requiring swallowing. Use effortful swallows with POs.

## 2015-11-24 NOTE — Therapy (Signed)
[facility] [address] [place], [place], [address] Phone: [phone]   Fax:  [phone]  Speech Language Pathology Treatment  Patient Details  Name: Benjamin Barr MRN: [id number] Date of Birth: 1945-12-27 Referring Provider: Eppie Gibson MD  Encounter Date: 11/24/2015      End of Session - 11/24/15 1223    Visit Number 3   Number of Visits 5   Date for SLP Re-Evaluation 02/06/16   SLP Start Time 46   SLP Stop Time  1058   SLP Time Calculation (min) 40 min   Activity Tolerance Patient tolerated treatment well      Past Medical History:  Diagnosis Date  . Anemia   . CAD (coronary artery disease)    a. 2008 PCI: Taxus DES  to mRCA, prox & mid LCFX;  b. 10/2014 MV: extensive ischemia in the RCA territory and small area of anterior ischemia;  c. 11/2014 PCI: LM nl, LAD 30ost/prox, 80d, LCX 90ost (3.0x12 Promus Prem DES), OM2 30, RCA 51m (3.0x12 Promus Prem DES), 59m, RPDA 90 (PTCA), EF 55-65%.  . Cancer ([place])    Head & neck ca  . Diverticulosis of colon   . Heart murmur   . History of colon polyps    2014--  hyperplastic, adenoma, and benign polyps  . Hyperlipidemia   . Hypertension   . Myocardial infarction   . Nonproliferative diabetic retinopathy ([place])   . Obesity   . Peripheral neuropathy ([place])   . Pneumonia    hx  . Prostate cancer ([facility])   . RBBB (right bundle branch block)   . Skin cancer   . Type 2 diabetes mellitus ([place])     Past Surgical History:  Procedure Laterality Date  . APPENDECTOMY    . CARDIAC CATHETERIZATION N/A 11/13/2014   Procedure: Left Heart Cath and Coronary Angiography;  Surgeon: [name] [name], MD;  Location: [facility];  Service: Cardiovascular;  Laterality: N/A;  . CARDIAC CATHETERIZATION  11/13/2014   Procedure: Coronary Stent Intervention;  Surgeon: [name] [name], MD;  Location: [facility];  Service: Cardiovascular;;  . CARDIOVASCULAR STRESS TEST   01-17-2012  dr [name]   Normal perfusion study/ no ischemia or infarction/  normal LVF and wall motion, ef 54%  . CORONARY ANGIOPLASTY WITH STENT PLACEMENT  05/04/2006   dr [name]   Severe 2-vessel obstructive CAD/  normal LVF/  PCI with DES to proxima and mid CFX and mRCA (total 3 DES)  . CRYOABLATION N/A 04/01/2014   Procedure: CRYO ABLATION PROSTATE;  Surgeon: [name], MD;  Location[facility];  Service: Urology;  Laterality: N/A;  . DIRECT LARYNGOSCOPY N/A 09/19/2015   Procedure: DIRECT LARYNGOSCOPY;  Surgeon: Izora Gala, MD;  Location: [facility];  Service: ENT;  Laterality: N/A;  . ESOPHAGOSCOPY N/A 09/19/2015   Procedure: ESOPHAGOSCOPY;  Surgeon: Izora Gala, MD;  Location: [facility];  Service: ENT;  Laterality: N/A;  . FLOOR OF MOUTH BIOPSY N/A 09/19/2015   Procedure: FLOOR OF MOUTH BIOPSY;  Surgeon: Izora Gala, MD;  Location: [facility];  Service: ENT;  Laterality: N/A;  . INSERTION OF SUPRAPUBIC CATHETER N/A 04/01/2014   Procedure: INSERTION OF SUPRAPUBIC CATHETER;  Surgeon: [name], MD;  Location: [facility];  Service: Urology;  Laterality: N/A;  suprapubic   . IR GENERIC HISTORICAL  11/13/2015   IR GASTROSTOMY TUBE MOD SED 11/13/2015 [name], DO [facility]-INTER[facility] RAD  . MULTIPLE TOOTH EXTRACTIONS  09/17/2015  . PARTIAL COLECTOMY  80's    There were no vitals filed for this visit.      Subjective Assessment - 11/24/15 1024    Subjective "Every time I sip I cough." Pt takes a lot of liquids via PEG.   Currently in Pain? No/denies               ADULT SLP TREATMENT - 11/24/15 1025      General Information   Behavior/Cognition Alert;Cooperative;Lethargic     Treatment Provided   Treatment provided Dysphagia     Dysphagia Treatment   Temperature Spikes Noted No   Treatment Methods Skilled observation;Therapeutic exercise;Patient/caregiver education   Patient observed directly with PO's Yes   Liquids provided via Cup   Oral  Phase Signs & Symptoms --  none noted   Pharyngeal Phase Signs & Symptoms Immediate cough  on 1/3 of swallows; 0/7 with chin tuck   Other treatment/comments Pt told SLP reason why he is completing HEP in general way ("Keep everything the way it should be wiht my swallowing.") Pt related to SLP that he has had sticky phlegm for a time which has now subsided. Pt states he has coughed with water in the last three days - water intake PO is minimal. Pt tells SLP he is not completing exercises as prescribed. Exercises were completed with rare min verbal and visual cues. SLP encouraged pt to swallow with chin tuck until Sunday of this week, then try again with WNL posturing. IF frequency of coughing remains, utilize chin tuck until next SLP appointment. SLP educated pt re: what kinds of POs to try again as he returns to POs. Pt told SLP 3 overt s/s aspiration PNA with modified independence.     Pain Assessment   Pain Assessment --          SLP Education - 11/24/15 1222    Education provided Yes   Education Details HEP, late effects head/neck radiation on swallowing, foods to consider moving back to PO diet, overt s/s aspiration PNA          SLP Short Term Goals - 11/24/15 1226      SLP SHORT TERM GOAL #1   Title pt will perform HEP with rare min A    Time 1   Period --  visits   Status On-going     SLP SHORT TERM GOAL #2   Title pt will tell SLP why he is completing HEP over two sessions   Baseline 11-24-15   Time 1   Period --  visits   Status Revised          SLP Long Term Goals - 11/24/15 1227      SLP LONG TERM GOAL #1   Title pt will complete HEP with inependence   Time 3   Period --  vistis   Status On-going     SLP LONG TERM GOAL #2   Title pt will tell SLP 3 s/s aspiration PNA with modified independence   Status Achieved     SLP LONG TERM GOAL #3   Title pt will tell benefits of a food journal in returning to full PO diet   Time 2   Period --  visits    Status New          Plan - 11/24/15 1224    Clinical Impression Statement Pt presents today with need to use chin tuck at this time for incr'd safety with H2O. Pt has  not completed HEP as prescribed/recommended and is taking sips of water minimally. Receiving primary nutrtition/hydration via PEG. SLP req'd to cue pt for HEP as well as reasoning for why pt is completing HEP. See goal update for details on goals. Cont'd skilled ST is necessary to assess pt's cont'd procedure with HEP as well as to assess pt's safety with POs.      Patient will benefit from skilled therapeutic intervention in order to improve the following deficits and impairments:   Dysphagia, unspecified type    Problem List Patient Active Problem List   Diagnosis Date Noted  . S/P gastrostomy ([place]) 11/17/2015  . Protein-calorie malnutrition, moderate ([place]) 11/13/2015  . Nausea without vomiting 10/28/2015  . Constipation, acute 10/28/2015  . 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 Weight loss 10/14/2015  . 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 Cancer of base of tongue ([place]) 09/22/2015  . History of prostate cancer 09/03/2015  . Tongue cancer ([place]) 09/03/2015  . Unstable angina ([place]) 11/14/2014  . Abnormal nuclear stress test 11/13/2014  . Angina pectoris ([place]) 11/13/2014  . PMR (polymyalgia rheumatica) ([facility]) 11/02/2014  . Chest pain with moderate risk for cardiac etiology 10/31/2014  . Diabetes mellitus with diabetic neuropathy, with long-term current use of insulin ([place]) 12/28/2010  . CAD S/P percutaneous coronary angioplasty   . Hypertension   . Hyperlipidemia   . Obesity     Elizette Shek ,MS, CCC-SLP  11/24/2015, 12:29 PM  [facility] [address] [place], [place], [address] Phone: [phone]   Fax:  [phone]   Name: Benjamin Barr MRN: [id number] Date of Birth: Aug 29, 1945

## 2015-11-27 ENCOUNTER — Other Ambulatory Visit: Payer: Self-pay | Admitting: Cardiology

## 2015-11-27 NOTE — Telephone Encounter (Signed)
REFILL 

## 2015-11-27 NOTE — Progress Notes (Incomplete)
°  Radiation Oncology         [phone] ________________________________  Name: Benjamin Barr MRN: [id number]  Date: 11/20/2015  DOB: 1945/09/10  End of Treatment Note  Diagnosis:   ***     Indication for treatment:  ***       Radiation treatment dates:   10/01/2015-11/20/2015  Site/dose:   ***  Beams/energy:   ***  Narrative: The patient tolerated radiation treatment relatively well.   ***  Plan: The patient has completed radiation treatment. The patient will return to radiation oncology clinic for routine followup in one month. I advised them to call or return sooner if they have any questions or concerns related to their recovery or treatment.  -----------------------------------  Eppie Gibson, MD

## 2015-12-03 ENCOUNTER — Encounter: Payer: Self-pay | Admitting: Radiation Oncology

## 2015-12-03 NOTE — Progress Notes (Signed)
  Radiation Oncology         [phone] ________________________________  Name: Benjamin Barr MRN: [id number]  Date: 12/03/2015  DOB: 04/23/1945  End of Treatment Note  Diagnosis:   Cancer of base of tongue STAGE IVA [id number]  Indication for treatment:  Definitive without systemic therapy, Curative       Radiation treatment dates:   10/01/15-11/20/15  Site/dose:   Oropharynx/base of neck / 70 Gy in 35 Fx  Beams/energy:   IMRT/ 6 X  Narrative: The patient tolerated radiation treatment relatively well.  Finishing treatment patient complained of fatigue, poor taste, and foam in mouth.  Plan: The patient has completed radiation treatment. The patient will return to radiation oncology clinic for routine followup in one half month. I advised them to call or return sooner if they have any questions or concerns related to their recovery or treatment.  -----------------------------------  Eppie Gibson, MD  This document serves as a record of services personally performed by Eppie Gibson, MD. It was created on her behalf by Bethann Humble, a trained medical scribe. The creation of this record is based on the scribe's personal observations and the provider's statements to them. This document has been checked and approved by the attending provider.

## 2015-12-04 ENCOUNTER — Encounter: Payer: Self-pay | Admitting: Radiation Oncology

## 2015-12-04 NOTE — Progress Notes (Signed)
  Benjamin Barr presents for follow up of radiation completed 11/20/15 to his Oropharynx/base of neck.  Pain issues, if any: He denies pain at this time, but reports pain when swallowing Using a feeding tube?: Yes, he is instilling 6 cans of osmolite daily with 1200- 1500 ml of free water instilled through his tube daily.  Weight changes, if any:  Wt Readings from Last 3 Encounters:  12/10/15 174 lb 9.6 oz (79.2 kg)  11/20/15 174 lb 9 oz (79.2 kg)  11/19/15 174 lb 6.4 oz (79.1 kg)   Swallowing issues, if any: He is not able to swallow related to pain. He will occasionally try a drink of water, but reports pain to the right side and back of his neck.  He also reports chronic nausea, and is nervous to drink because he is afraid he might throw up.  Smoking or chewing tobacco? No Using fluoride trays daily? Yes, several times a week.  Last ENT visit was on: Not since diagnosis Other notable issues, if any:  He has thick saliva. He noticed some white patches in the back of his throat several days ago. He is occasionally using the sonafine cream to his neck. His neck is red with some dry peeling areas noted. He knows to use neosporin to these area if needed. He will also switch to a vitamin E cream when his tube of sonafine is completed.  He reports boredom at home, but denies fatigue.  Dr. Alvy Bimler 11/20/15  BP (!) 147/88   Pulse 88   Temp 97.8 F (36.6 C)   Ht 5\' 8"  (1.727 m)   Wt 174 lb 9.6 oz (79.2 kg)   SpO2 97% Comment: room air  BMI 26.55 kg/m

## 2015-12-10 ENCOUNTER — Ambulatory Visit
Admission: RE | Admit: 2015-12-10 | Discharge: 2015-12-10 | Disposition: A | Discharge status: Home / Self Care | Payer: Medicare Other | Source: Ambulatory Visit | Attending: Radiation Oncology | Admitting: Radiation Oncology

## 2015-12-10 ENCOUNTER — Encounter: Payer: Self-pay | Admitting: Radiation Oncology

## 2015-12-10 ENCOUNTER — Encounter: Payer: Self-pay | Admitting: *Deleted

## 2015-12-10 VITALS — BP 147/88 | HR 88 | Temp 97.8°F | Ht 68.0 in | Wt 174.6 lb

## 2015-12-10 DIAGNOSIS — R110 Nausea: Secondary | ICD-10-CM | POA: Diagnosis not present

## 2015-12-10 DIAGNOSIS — K5900 Constipation, unspecified: Secondary | ICD-10-CM | POA: Insufficient documentation

## 2015-12-10 DIAGNOSIS — R634 Abnormal weight loss: Secondary | ICD-10-CM

## 2015-12-10 DIAGNOSIS — Z931 Gastrostomy status: Secondary | ICD-10-CM | POA: Insufficient documentation

## 2015-12-10 DIAGNOSIS — C01 Malignant neoplasm of base of tongue: Secondary | ICD-10-CM | POA: Diagnosis present

## 2015-12-10 DIAGNOSIS — N200 Calculus of kidney: Secondary | ICD-10-CM | POA: Diagnosis not present

## 2015-12-10 DIAGNOSIS — K8020 Calculus of gallbladder without cholecystitis without obstruction: Secondary | ICD-10-CM | POA: Diagnosis not present

## 2015-12-10 DIAGNOSIS — Z794 Long term (current) use of insulin: Secondary | ICD-10-CM | POA: Diagnosis not present

## 2015-12-10 DIAGNOSIS — I700 Atherosclerosis of aorta: Secondary | ICD-10-CM | POA: Diagnosis not present

## 2015-12-10 HISTORY — DX: Personal history of irradiation: Z92.3

## 2015-12-10 MED ORDER — HYDROCODONE-ACETAMINOPHEN 7.5-325 MG/15ML PO SOLN: ORAL | 0 refills | Status: DC

## 2015-12-10 NOTE — Progress Notes (Signed)
Oncology Nurse Navigator Documentation  Met with Benjamin Barr during 3 w post-RT follow-up with Dr. Isidore Moos.  He was accompanied by his wife. He reported:  Ongoing nausea, not taking available antiemetics.  Voiced understanding to take Zofran as prescribed.  Last BM about 5-6 days ago.  He understands nausea could be d/t constipation r/t Fentanyl.  Voiced understanding of Dr. Pearlie Oyster guidance to wean off Fentanyl and to take Hycet PRN as prescribed for throat pain. for , begin bowel regime to have BM, subsequent maintenance regime. He was encouraged to resume swallowing exercises per SLP's instruction, begin oral intake of pudding, yogurt, applesauce. They understand I can be reached with questions/concerns.  [name], RN, BSN, [facility] [place] [phone]

## 2015-12-10 NOTE — Progress Notes (Signed)
Radiation Oncology         [phone] ________________________________  Name: Benjamin Barr MRN: [id number]  Date: 12/10/2015  DOB: Nov 25, 1945  Follow-Up Visit Note  Outpatient  CC: [name], MD  Aura Dials, MD  Diagnosis and Prior Radiotherapy:    ICD-9-CM ICD-10-CM   1. Cancer of base of tongue ([facility]CC) 141.0 C01 HYDROcodone-acetaminophen (HYCET) 7.5-325 mg/15 ml solution     Amb Referral to Survivorship Long term   Cancer of base of tongue 10/01/15-11/20/15 70 Gy in 35 fx to oropharynx/ base of neck  Narrative:  The patient returns today for routine follow-up.  He denies pain at this time. However, he reports pain when swallowing. He is using a feeding tube and notes he is instilling 6 cans of osmolite daily with 1200-1500 mlLof free water instilled through his tube daily.  He is unable to swallow related to pain. He will occasionally try and drink water. This results in pain to the right side and back of his neck. Patient no longer uses scopolamine, he uses fentanyl patch intermittently. He also reports chronic nausea, and he is nervous to drink because he is afraid he might throw up. He notes using fluoride trays several times a week. He has not seen ENT since diagnosis. Patient also complains of thick saliva and some white patches in the back of his throat from several days ago. Patient has erythema and dry peeling noted on his neck. He knows to use neosporin to these areas if needed. He occasionally uses Sonafine to the neck. He will switch to a Vitamin E cream when his Sonafine tube is completed.  Of note, patient complains of constipation.                ALLERGIES:  is allergic to no known allergies.  Meds: Current Outpatient Prescriptions  Medication Sig Dispense Refill  . fluticasone (FLONASE) 50 MCG/ACT nasal spray Place 2 sprays into both nostrils daily.    . insulin aspart protamine- aspart (NOVOLOG MIX 70/30) (70-30) 100 UNIT/ML injection Inject 10-20 Units  into the skin daily with supper. 1 unit before large meal. Per sliding scale    . insulin glargine (LANTUS) 100 UNIT/ML injection Inject 100 Units into the skin daily.     [name] scopolamine (TRANSDERM-SCOP) 1 MG/3DAYS Place 1 patch (1.5 mg total) onto the skin every 3 (three) days. 10 patch 0  . clopidogrel (PLAVIX) 75 MG tablet TAKE 1 TABLET EVERY DAY (Patient not taking: Reported on 12/10/2015) 90 tablet 1  . HYDROcodone-acetaminophen (HYCET) 7.5-325 mg/15 ml solution Take 10-46mL every 4 hours as needed for pain. 500 mL 0  . lidocaine (XYLOCAINE) 2 % solution Patient: Mix 1part 2% viscous lidocaine, 1part H20. Swish and swallow 52mL of this mixture, 60min before meals and at bedtime, up to QID (Patient not taking: Reported on 12/10/2015) 100 mL 5  . Nutritional Supplements (FEEDING SUPPLEMENT, OSMOLITE 1.5 CAL,) LIQD Give 1 1/2 cans osmolite 1.5 via PEG QID with 60 cc free water before and after feedings.  In addition, flush tube with 240 cc free water TID between meals. 6 Bottle 0  . ondansetron (ZOFRAN) 8 MG tablet Take 1 tablet (8 mg total) by mouth every 8 (eight) hours as needed for nausea. (Patient not taking: Reported on 12/10/2015) 60 tablet 3  . prochlorperazine (COMPAZINE) 10 MG tablet Take 1 tablet (10 mg total) by mouth every 6 (six) hours as needed. (Patient not taking: Reported on 12/10/2015) 60 tablet 3  . senna (  SENOKOT) 8.6 MG tablet Take 1 tablet by mouth daily as needed for constipation.    . sodium fluoride (FLUORISHIELD) 1.1 % GEL dental gel Instill one drop of gel per tooth space of fluoride tray. Place over teeth for 5 minutes. Remove. Spit out excess. Repeat nightly. 120 mL 11  . sucralfate (CARAFATE) 1 g tablet Dissolve 1 tablet in 10 mL H20 and swallow up to QID for sore throat. (Patient not taking: Reported on 12/10/2015) 48 tablet 5   No current facility-administered medications for this encounter.     Physical Findings: The patient is in no acute distress. Patient is alert and  oriented.  height is 5\' 8"  (1.727 m) and weight is 174 lb 9.6 oz (79.2 kg). His temperature is 97.8 F (36.6 C). His blood pressure is 147/88 (abnormal) and his pulse is 88. His oxygen saturation is 97%. .    General: Alert and oriented, in no acute distress HEENT: Head is normocephalic. Extraocular movements are intact. Oropharynx is clear. White spots noted on tongue, this is not definitively thrush.   Resolving mucositis in oropharynx Neck: Neck is supple, no palpable cervical or supraclavicular lymphadenopathy. Faint thickening and nodularity of right lower neck. This is much improved compared to prior to starting treatment. Skin is dry and slightly erythematous in the neck.   Lab Findings: Lab Results  Component Value Date   WBC 9.3 11/13/2015   HGB 14.6 11/13/2015   HCT 44.5 11/13/2015   MCV 84.5 11/13/2015   PLT 155 11/13/2015    Radiographic Findings: Ct Abdomen Wo Contrast  Result Date: 11/11/2015 CLINICAL DATA:  Pre gastrostomy tube planning.  Tongue cancer. EXAM: CT ABDOMEN WITHOUT CONTRAST TECHNIQUE: Multidetector CT imaging of the abdomen was performed following the standard protocol without IV contrast. COMPARISON:  PET/CT 08/29/2015 FINDINGS: Lower chest: Coronary artery atherosclerotic calcification is present. No pulmonary nodules or pleural effusion. No visible pericardial effusion. Hepatobiliary: Normal hepatic size and contours. No perihepatic ascites. No intra- or extrahepatic biliary dilatation. Multiple small stones are seen within the gallbladder fundus. Pancreas: There is moderate atrophy of the pancreas. No peripancreatic fluid collection. Spleen: Normal. Adrenals/Urinary Tract: Normal adrenal glands. Right renal cyst measures 4.9 cm. Nonobstructing left renal calculus measures 2 mm. No hydronephrosis. No proximal ureteral abnormality. Mild bilateral perinephric stranding, likely senescent. Stomach/Bowel: A portion of the transverse colon is directly anterolateral to  the greater curvature of the stomach. There is no dilated bowel or evidence of colitis/enteritis. Vascular/Lymphatic: Moderate atherosclerotic calcification of the abdominal aorta. No aneurysm. No abdominal adenopathy. Musculoskeletal: Multilevel thoracic osteophytosis and lumbar facet arthrosis. No bony spinal canal stenosis. No lytic or blastic lesions. Normal visualized extrathoracic and extraperitoneal soft tissues. Other: No contributory non-categorized findings. IMPRESSION: 1. A portion of the transverse colon travels anterior to the stomach in the left upper quadrant. 2. Aortic and coronary artery atherosclerosis. 3. Choletlithiasis without evidence of acute cholecystitis. 4. Nonobstructing left nephrolithiasis. Electronically Signed   By: Ulyses Jarred M.D.   On: 11/11/2015 21:11   Ir Gastrostomy Tube Mod Sed  Result Date: 11/13/2015 INDICATION: 70 year old male with a history of head and neck carcinoma. He has dysphagia and has been referred for percutaneous gastrostomy tube. EXAM: PERC PLACEMENT GASTROSTOMY MEDICATIONS: 2.0 g Ancef; Antibiotics were administered within 1 hour of the procedure. ANESTHESIA/SEDATION: Versed 2.0 mg IV; Fentanyl 150 mcg IV Moderate Sedation Time:  13 The patient was continuously monitored during the procedure by the interventional radiology nurse under my direct supervision. CONTRAST:  10 cc -  administered into the gastric lumen. FLUOROSCOPY TIME:  Fluoroscopy Time: 0 minutes 42 seconds (10.0 mGy). COMPLICATIONS: None immediate. PROCEDURE: Informed written consent was obtained from the patient's family after a thorough discussion of the procedural risks, benefits and alternatives. All questions were addressed. Maximal Sterile Barrier Technique was utilized including caps, mask, sterile gowns, sterile gloves, sterile drape, hand hygiene and skin antiseptic. A timeout was performed prior to the initiation of the procedure. The procedure, risks, benefits, and alternatives were  explained to the patient. Questions regarding the procedure were encouraged and answered. The patient understands and consents to the procedure. The epigastrium was prepped with Betadine in a sterile fashion, and a sterile drape was applied covering the operative field. A sterile gown and sterile gloves were used for the procedure. A 5-French orogastric tube is placed under fluoroscopic guidance. Scout imaging of the abdomen confirms barium within the transverse colon. The stomach was distended with gas. Under fluoroscopic guidance, an 18 gauge needle was utilized to puncture the anterior wall of the body of the stomach. An Amplatz wire was advanced through the needle passing a T fastener into the lumen of the stomach. The T fastener was secured for gastropexy. A 9-French sheath was inserted. A snare was advanced through the 9-French sheath. A Britta Mccreedy was advanced through the orogastric tube. It was snared then pulled out the oral cavity, pulling the snare, as well. The leading edge of the gastrostomy was attached to the snare. It was then pulled down the esophagus and out the percutaneous site. It was secured in place. Contrast was injected. No complication IMPRESSION: Status post fluoroscopic placed percutaneous gastrostomy tube, with 20 [place] pull-through. Signed, Dulcy Fanny. Earleen Newport, DO [facility] Radiology Electronically Signed   By: [name] D.O.   On: 11/13/2015 14:40    Impression/Plan:  He is recovering from radiation therapy. His main issues are constipation and nausea currently.   Recommended for patient to take Ondansetron every 8 hours for nausea. Advised patient to remove Fentanyl patch (he is pain free and using this inconsistently) and take hydrocodone prn. Recommended Miralax twice a day. If patient does not have a bowel movement by tomorrow, advised patient to take Mag citrate or a fleets enema. Advised patient to finish Fluconazole Rx.  Advised patient to apply Vitamin E lotion to skin to replace the Sonafine. Plan an appointment with survivorship in 1 month. Reminded patient to work on swallowing exercises and experiment with swallowing small amounts of water or soft foods to prevent long term dysphagia.  I will see him in 3 mo after restaging PET  ____________________________________   Eppie Gibson, MD This document serves as a record of services personally performed by Eppie Gibson, MD. It was created on her behalf by Bethann Humble, a trained medical scribe. The creation of this record is based on the scribe's personal observations and the provider's statements to them. This document has been checked and approved by the attending provider.

## 2015-12-22 ENCOUNTER — Ambulatory Visit: Payer: Medicare Other | Admitting: Nutrition

## 2015-12-22 ENCOUNTER — Ambulatory Visit
Admission: RE | Admit: 2015-12-22 | Discharge: 2015-12-22 | Disposition: A | Discharge status: Home / Self Care | Payer: Medicare Other | Source: Ambulatory Visit | Attending: Radiation Oncology | Admitting: Radiation Oncology

## 2015-12-22 ENCOUNTER — Ambulatory Visit (HOSPITAL_COMMUNITY): Payer: Self-pay | Admitting: Dentistry

## 2015-12-22 DIAGNOSIS — C760 Malignant neoplasm of head, face and neck: Secondary | ICD-10-CM | POA: Insufficient documentation

## 2015-12-22 DIAGNOSIS — Z794 Long term (current) use of insulin: Secondary | ICD-10-CM | POA: Diagnosis not present

## 2015-12-22 DIAGNOSIS — Z931 Gastrostomy status: Secondary | ICD-10-CM | POA: Diagnosis not present

## 2015-12-22 DIAGNOSIS — K1329 Other disturbances of oral epithelium, including tongue: Secondary | ICD-10-CM | POA: Insufficient documentation

## 2015-12-22 DIAGNOSIS — Z923 Personal history of irradiation: Secondary | ICD-10-CM | POA: Insufficient documentation

## 2015-12-22 DIAGNOSIS — Z7902 Long term (current) use of antithrombotics/antiplatelets: Secondary | ICD-10-CM | POA: Insufficient documentation

## 2015-12-22 DIAGNOSIS — Z79899 Other long term (current) drug therapy: Secondary | ICD-10-CM | POA: Diagnosis not present

## 2015-12-22 DIAGNOSIS — C01 Malignant neoplasm of base of tongue: Secondary | ICD-10-CM

## 2015-12-22 NOTE — Progress Notes (Signed)
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 Weight has improved and was documented as 175.2 pounds increased from 174.6 pounds. Patient is tolerating 6 cans Osmolite 1.5 daily with 60 cc free water before and after bolus feedings and 240 cc free water 3 times a day. Patient reports he is sipping on small amounts of liquids. He reports taste buds have improved. Patient is ready to begin trying other liquids and soft foods.  Nutrition diagnosis: Inadequate oral intake continues.  Intervention:  Patient will continue current tube feeding regimen providing greater than 90% of estimated nutrition needs. Educated patient on how to begin liquids and soft moist foods throughout the day. Provided specific examples of foods for patient to try. Questions were answered.  Teach back method was used.  Monitoring, evaluation, goals:  Patient will tolerate tube feeding at goal rate and begin oral intake.  Next visit:  Monday, December 4 before speech therapy visit.  **Disclaimer: This note was dictated with voice recognition software. Similar sounding words can inadvertently be transcribed and this note may contain transcription errors which may not have been corrected upon publication of note.**

## 2015-12-22 NOTE — Progress Notes (Signed)
Radiation Oncology         [phone] ________________________________  Name: Benjamin Barr MRN: [id number]  Date: 12/22/2015  DOB: 1945/03/24  Follow-Up Visit Note  CC: [name], MD  Aura Dials, MD  Diagnosis and Prior Radiotherapy:    No diagnosis found.  CHIEF COMPLAINT:  Here due to white coating on tongue  Narrative:  The patient returns today - unscheduled - for follow-up.  He is concerned due to white coating on tongue that persisted after completed fluconazole in the past couple of days. Also has a dry throat and persistent throat pain/rawness in the right side                   ALLERGIES:  is allergic to no known allergies.  Meds: Current Outpatient Prescriptions  Medication Sig Dispense Refill  . clopidogrel (PLAVIX) 75 MG tablet TAKE 1 TABLET EVERY DAY (Patient not taking: Reported on 12/10/2015) 90 tablet 1  . fluticasone (FLONASE) 50 MCG/ACT nasal spray Place 2 sprays into both nostrils daily.    [name] HYDROcodone-acetaminophen (HYCET) 7.5-325 mg/15 ml solution Take 10-75mL every 4 hours as needed for pain. 500 mL 0  . insulin aspart protamine- aspart (NOVOLOG MIX 70/30) (70-30) 100 UNIT/ML injection Inject 10-20 Units into the skin daily with supper. 1 unit before large meal. Per sliding scale    . insulin glargine (LANTUS) 100 UNIT/ML injection Inject 100 Units into the skin daily.     [name] lidocaine (XYLOCAINE) 2 % solution Patient: Mix 1part 2% viscous lidocaine, 1part H20. Swish and swallow 48mL of this mixture, 29min before meals and at bedtime, up to QID (Patient not taking: Reported on 12/10/2015) 100 mL 5  . Nutritional Supplements (FEEDING SUPPLEMENT, OSMOLITE 1.5 CAL,) LIQD Give 1 1/2 cans osmolite 1.5 via PEG QID with 60 cc free water before and after feedings.  In addition, flush tube with 240 cc free water TID between meals. 6 Bottle 0  . ondansetron (ZOFRAN) 8 MG tablet Take 1 tablet (8 mg total) by mouth every 8 (eight) hours as needed for nausea.  (Patient not taking: Reported on 12/10/2015) 60 tablet 3  . prochlorperazine (COMPAZINE) 10 MG tablet Take 1 tablet (10 mg total) by mouth every 6 (six) hours as needed. (Patient not taking: Reported on 12/10/2015) 60 tablet 3  . scopolamine (TRANSDERM-SCOP) 1 MG/3DAYS Place 1 patch (1.5 mg total) onto the skin every 3 (three) days. 10 patch 0  . senna (SENOKOT) 8.6 MG tablet Take 1 tablet by mouth daily as needed for constipation.    . sodium fluoride (FLUORISHIELD) 1.1 % GEL dental gel Instill one drop of gel per tooth space of fluoride tray. Place over teeth for 5 minutes. Remove. Spit out excess. Repeat nightly. 120 mL 11  . sucralfate (CARAFATE) 1 g tablet Dissolve 1 tablet in 10 mL H20 and swallow up to QID for sore throat. (Patient not taking: Reported on 12/10/2015) 48 tablet 5   No current facility-administered medications for this encounter.     Physical Findings: The patient is in no acute distress. Patient is alert and oriented. Wt Readings from Last 3 Encounters:  12/22/15 175 lb 3.2 oz (79.5 kg)  12/10/15 174 lb 9.6 oz (79.2 kg)  11/20/15 174 lb 9 oz (79.2 kg)    vitals were not taken for this visit..  General: Alert and oriented, in no acute distress HEENT: Head is normocephalic. Extraocular movements are intact. Oropharynx is notable for resolving mucositis in the right upper  throat, and a white coating on tongue that is mild and consistent with resolving mucositis rather than a thrush infection Neck: Neck is notable for healing of skin.  No masses appreciated in right level 2 where patient asked me to check him Skin: Skin in treatment fields shows satisfactory healing thus far, skin is somewhat dry    Lab Findings: Lab Results  Component Value Date   WBC 9.3 11/13/2015   HGB 14.6 11/13/2015   HCT 44.5 11/13/2015   MCV 84.5 11/13/2015   PLT 155 11/13/2015    No results found for: TSH  Radiographic Findings: No results found.  Impression/Plan:    1) Head and Neck  Cancer Status: healing from RT  2) Nutritional Status: followed by Dory Peru today PEG tube: still using  3) Risk Factors: The patient has been educated about risk factors including alcohol and tobacco abuse; they understand that avoidance of alcohol and tobacco is important to prevent recurrences as well as other cancers  4) Swallowing: continue swallowing exercises  5) Dental: Encouraged to continue regular followup with dentistry, and dental hygiene including fluoride rinses.   6) Thyroid function: No results found for: TSH will check at next visit  7) Other: white coating on tongue is likely resolving mucositis and I don't think resuming fluconazole will change it.  Advised him to try a tongue scraper or soft bristle tooth brush on his tongue  8) Follow-up as scheduled, post PET. The patient was encouraged to call with any issues or questions before then.   _____________________________________   Eppie Gibson, MD

## 2015-12-29 ENCOUNTER — Encounter (HOSPITAL_COMMUNITY): Payer: Self-pay | Admitting: Dentistry

## 2015-12-29 ENCOUNTER — Ambulatory Visit (HOSPITAL_COMMUNITY): Payer: Self-pay | Admitting: Dentistry

## 2015-12-29 ENCOUNTER — Telehealth: Payer: Self-pay | Admitting: Cardiology

## 2015-12-29 VITALS — BP 173/71 | HR 105 | Temp 98.3°F | Wt 175.0 lb

## 2015-12-29 DIAGNOSIS — Z5189 Encounter for other specified aftercare: Secondary | ICD-10-CM | POA: Diagnosis not present

## 2015-12-29 DIAGNOSIS — K1233 Oral mucositis (ulcerative) due to radiation: Secondary | ICD-10-CM

## 2015-12-29 DIAGNOSIS — R432 Parageusia: Secondary | ICD-10-CM

## 2015-12-29 DIAGNOSIS — Z0189 Encounter for other specified special examinations: Secondary | ICD-10-CM

## 2015-12-29 DIAGNOSIS — K117 Disturbances of salivary secretion: Secondary | ICD-10-CM

## 2015-12-29 DIAGNOSIS — C01 Malignant neoplasm of base of tongue: Secondary | ICD-10-CM

## 2015-12-29 DIAGNOSIS — Z923 Personal history of irradiation: Secondary | ICD-10-CM

## 2015-12-29 DIAGNOSIS — R682 Dry mouth, unspecified: Secondary | ICD-10-CM

## 2015-12-29 DIAGNOSIS — R1310 Dysphagia, unspecified: Secondary | ICD-10-CM

## 2015-12-29 NOTE — Patient Instructions (Signed)

## 2015-12-29 NOTE — Telephone Encounter (Signed)
New message    Patient calling went in for radiation treatment was advise for him to go back on it.    Pt C/O medication issue:  1. Name of Medication: hydrochlorothiazide 25 mg   2. How are you currently taking this medication (dosage and times per day)? Once a day   3. Are you having a reaction (difficulty breathing--STAT)? No   4. What is your medication issue? Patient wants to know does he need to resume medication or discontinue.

## 2015-12-29 NOTE — Telephone Encounter (Signed)
Returned call to patient.Dr.Jordan advised to resume HCTZ 25 mg daily.Stated he will call back when he needs refills.

## 2015-12-29 NOTE — Telephone Encounter (Signed)
Yes he should resume HCTZ for BP.  [name] MD, [facility]

## 2015-12-29 NOTE — Telephone Encounter (Signed)
Spoke with pt states that he finished radiation treatments 11-20-15 he states that he stopped HCTZ for this treatment. He went to the dentist today and his BP 173/71 P=105. Pt states that it has been running in the 150's. Pt would like to know if he should restart since BP has been so high today. Please advise

## 2015-12-29 NOTE — Progress Notes (Addendum)
12/29/2015  Patient Name:   Benjamin Barr Date of Birth:   [birth year] Medical Record Number: [id number]  BP (!) 173/71 (BP Location: Left Arm)   Pulse (!) 105   Temp 98.3 F (36.8 C) (Oral)   Wt 175 lb (79.4 kg)   BMI 26.61 kg/m   Benjamin Barr presents for oral examination after radiation therapy. Patient has completed all radiation treatments from 09/21/15 thru 11/20/15. No chemotherapy.  REVIEW OF CHIEF COMPLAINTS:  DRY MOUTH: Yes HARD TO SWALLOW: Yes.  HURT TO SWALLOW: Yes.  TASTE CHANGES: Taste is returning slowly SORES IN MOUTH: No, but has coating on tongue. Doubt oral candidiasis. Most likely white hairy tongue. TRISMUS: No problems with trismus WEIGHT: 175 pounds  HOME OH REGIMEN:  BRUSHING: Once a day. Encouraged to brush twice daily. FLOSSING: No.  RINSES: Biotene rinse and mouth spray. FLUORIDE: Using fluoride every 3 days. Encouraged daily use at bedtime. TRISMUS EXERCISES:  Maximum interincisal opening: 47 mm   DENTAL EXAM:  Oral Hygiene:(PLAQUE): Lack noted generalized. Oral hygiene through been highly suggested. LOCATION OF MUCOSITIS: None noted. Patient has white hairy tongue consistent with not brushing his tongue daily. Patient encouraged to use tongue scraper brush tongue twice daily. Doubt oral candidiasis. DESCRIPTION OF SALIVA: Decreased. Moderate xerostomia. ANY EXPOSED BONE: None noted OTHER WATCHED AREAS: Upper right quadrant with questionable delayed healing and sinus involvement. Valsalva maneuver negative. Dr. Benson Norway to evaluate this further over the next 1-2 months. DX: Xerostomia, Dysgeusia, Dysphagia, Odynophagia, Accretions, Mucositis and Delayed healing upper right quadrant with questionable sinus involvement.  RECOMMENDATIONS: 1. Brush after meals and at bedtime.  Use fluoride at bedtime. 2. Use trismus exercises as directed. 3. Use Biotene Rinse or salt water/baking soda rinses. 4. Multiple sips of water as needed. 5. Return to Dr.  Johnnye Sima for exam and cleaning in January of 2018. 6. Return to Dr. Benson Norway for follow-up evaluation of upper right quadrant for questionable sinus involvement area #2 in [place] fo 2018 or earlier is symptoms arise in upper right quadrant.     Lenn Cal, DDS

## 2016-01-06 ENCOUNTER — Encounter (HOSPITAL_COMMUNITY): Payer: Self-pay | Admitting: Dentistry

## 2016-01-09 ENCOUNTER — Ambulatory Visit (HOSPITAL_BASED_OUTPATIENT_CLINIC_OR_DEPARTMENT_OTHER): Payer: Medicare Other | Admitting: Adult Health

## 2016-01-09 ENCOUNTER — Other Ambulatory Visit: Payer: Self-pay | Admitting: *Deleted

## 2016-01-09 ENCOUNTER — Encounter: Payer: Self-pay | Admitting: Adult Health

## 2016-01-09 VITALS — BP 134/77 | HR 107 | Temp 98.2°F | Resp 17 | Wt 172.0 lb

## 2016-01-09 DIAGNOSIS — K219 Gastro-esophageal reflux disease without esophagitis: Secondary | ICD-10-CM | POA: Diagnosis not present

## 2016-01-09 DIAGNOSIS — K117 Disturbances of salivary secretion: Secondary | ICD-10-CM

## 2016-01-09 DIAGNOSIS — Z7289 Other problems related to lifestyle: Secondary | ICD-10-CM | POA: Diagnosis not present

## 2016-01-09 DIAGNOSIS — Z720 Tobacco use: Secondary | ICD-10-CM

## 2016-01-09 DIAGNOSIS — C01 Malignant neoplasm of base of tongue: Secondary | ICD-10-CM | POA: Diagnosis present

## 2016-01-09 DIAGNOSIS — Y842 Radiological procedure and radiotherapy as the cause of abnormal reaction of the patient, or of later complication, without mention of misadventure at the time of the procedure: Secondary | ICD-10-CM

## 2016-01-09 DIAGNOSIS — E46 Unspecified protein-calorie malnutrition: Secondary | ICD-10-CM | POA: Diagnosis not present

## 2016-01-09 DIAGNOSIS — R110 Nausea: Secondary | ICD-10-CM

## 2016-01-09 DIAGNOSIS — M353 Polymyalgia rheumatica: Secondary | ICD-10-CM

## 2016-01-09 DIAGNOSIS — R112 Nausea with vomiting, unspecified: Secondary | ICD-10-CM

## 2016-01-09 MED ORDER — PROCHLORPERAZINE MALEATE 10 MG PO TABS: 10.0000 mg | ORAL_TABLET | Freq: Four times a day (QID) | ORAL | 3 refills | Status: DC | PRN

## 2016-01-09 MED ORDER — PILOCARPINE HCL 5 MG PO TABS: 5.0000 mg | ORAL_TABLET | Freq: Two times a day (BID) | ORAL | 4 refills | Status: DC

## 2016-01-09 MED ORDER — PANTOPRAZOLE SODIUM 40 MG PO TBEC: 40.0000 mg | DELAYED_RELEASE_TABLET | Freq: Every day | ORAL | 3 refills | Status: DC

## 2016-01-09 MED ORDER — ONDANSETRON HCL 8 MG PO TABS: 8.0000 mg | ORAL_TABLET | Freq: Three times a day (TID) | ORAL | 3 refills | Status: DC | PRN

## 2016-01-09 NOTE — Progress Notes (Signed)
CLINIC:  Survivorship  REASON FOR VISIT:  Routine follow-up for head & neck cancer & to address post-treatment acute survivorship needs.   BRIEF ONCOLOGIC HISTORY:    Tongue cancer ([place])   08/20/2015 Miscellaneous    He was evaluated by ENT      08/22/2015 Imaging    CT neck showed malignant adenopathy right supraclavicular region compatible with metastatic disease. Biopsy recommended. No definite pharyngeal mass lesion identified by CT.       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 No other primary or metastatic cancer. 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       Cancer of base of tongue [facility])      INTERVAL HISTORY:  Mr. Sobh presents to the Survivorship Clinic for routine follow-up after completing radiation therapy about 7 weeks ago.  He had a G-tube placed on 11/13/15 to optimize his nutrition.   His biggest concerns today are xerostomia and nausea/vomiting.  He stopped taking the Zofran about 1.5 weeks ago and has since had near daily nausea with vomiting.  He also reports having several bowel movements yesterday as well.  He was having to take Miralax because the Zofran gave him constipation, but this has resolved.  As a result of the nausea, he has cut back his tube feedings. He has lost about 3.5 lbs since his last visit about 2 weeks ago.  He is only instilling 4 cans/day (instead of 6 cans/day as recommended by Dory Peru,  RD).  For the dry mouth, he has tried a few of the Biotene products, but he hasn't found them to be helpful at all.  He tries to drink water, but after a few sips he gets nauseated. His secretions are very thick.  He feels like a lot of acid comes up in his throat at times.  He restarted the HCTZ about 10 days ago, which was stopped by Dr. Alvy Bimler during his radiation treatments for concerns of dehydration.  He tells me he is not voiding often, only about 2 times/day. Denies any dizziness with position changes or standing.   He denies any pain, either at rest or with swallowing.  "I still have 1/2 bottle of my pain medicine. I think my pain is okay."    He is frustrated by his recovery and wants to know when he will feel better.     Pain: "My throat doesn't really hurt at all."   Nutritional Status:  -Intake/Diet: Has not been trying to eat any foods by mouth yet. Tube feedings only; instilling 4 cans/day with water flushes.  -Using a feeding tube? Yes -Weight change: LOSS ~ 3.5 lbs since 12/22/15  Swallowing:  Not swallowing much by mouth d/t nausea     ADDITIONAL REVIEW OF SYSTEMS:  Review of Systems  Constitutional: Positive  for malaise/fatigue and weight loss.  HENT:       Xerostomia   Eyes: Negative.   Respiratory: Negative.   Cardiovascular: Negative.   Gastrointestinal: Positive for nausea and vomiting.  Genitourinary: Negative.   Musculoskeletal:       Reports intermittent myalgias/arthralgias (has history of polymyalgia rheumatica per patient; being managed by PCP).   Skin: Negative.   Neurological: Negative.  Negative for dizziness.  Endo/Heme/Allergies: Negative.   Psychiatric/Behavioral: Positive for depression.     CURRENT MEDICATIONS:  Current Outpatient Prescriptions on File Prior to Visit  Medication Sig Dispense Refill  . clopidogrel (PLAVIX) 75 MG tablet TAKE 1 TABLET EVERY DAY (Patient not taking: Reported on 12/10/2015) 90 tablet 1  . fluticasone (FLONASE) 50  MCG/ACT nasal spray Place 2 sprays into both nostrils daily.    . hydrochlorothiazide (HYDRODIURIL) 25 MG tablet Take 1 tablet (25 mg total) by mouth daily. 90 tablet 3  . HYDROcodone-acetaminophen (HYCET) 7.5-325 mg/15 ml solution Take 10-62mL every 4 hours as needed for pain. 500 mL 0  . insulin aspart protamine- aspart (NOVOLOG MIX 70/30) (70-30) 100 UNIT/ML injection Inject 10-20 Units into the skin daily with supper. 1 unit before large meal. Per sliding scale    . insulin glargine (LANTUS) 100 UNIT/ML injection Inject 100 Units into the skin daily.     [name] lidocaine (XYLOCAINE) 2 % solution Patient: Mix 1part 2% viscous lidocaine, 1part H20. Swish and swallow 4mL of this mixture, 59min before meals and at bedtime, up to QID (Patient not taking: Reported on 12/10/2015) 100 mL 5  . Nutritional Supplements (FEEDING SUPPLEMENT, OSMOLITE 1.5 CAL,) LIQD Give 1 1/2 cans osmolite 1.5 via PEG QID with 60 cc free water before and after feedings.  In addition, flush tube with 240 cc free water TID between meals. 6 Bottle 0  . ondansetron (ZOFRAN) 8 MG tablet Take 1 tablet (8 mg total) by mouth every 8 (eight) hours as needed for nausea. (Patient not taking: Reported on 12/10/2015) 60 tablet 3  . prochlorperazine (COMPAZINE) 10 MG tablet Take 1 tablet (10 mg total) by mouth every 6 (six) hours as needed. (Patient not taking: Reported on 12/10/2015) 60 tablet 3  . scopolamine (TRANSDERM-SCOP) 1 MG/3DAYS Place 1 patch (1.5 mg total) onto the skin every 3 (three) days. 10 patch 0  . senna (SENOKOT) 8.6 MG tablet Take 1 tablet by mouth daily as needed for constipation.    . sodium fluoride (FLUORISHIELD) 1.1 % GEL dental gel Instill one drop of gel per tooth space of fluoride tray. Place over teeth for 5 minutes. Remove. Spit out excess. Repeat nightly. 120 mL 11  . sucralfate (CARAFATE) 1 g tablet Dissolve 1 tablet in 10 mL H20 and swallow up to QID for sore throat. (Patient not taking: Reported on 12/10/2015) 48  tablet 5   No current facility-administered medications on file prior to visit.     ALLERGIES:  Allergies  Allergen Reactions  . No Known Allergies Other (See Comments)     PHYSICAL EXAM:  Vitals:   01/09/16 0933  BP: 134/77  Pulse: (!) 107  Resp: 17  Temp: 98.2 F (36.8 C)   Filed Weights   01/09/16 0933  Weight: 172 lb (78 kg)   Weight Date  172 lb (78 kg) 01/09/16  175 lb 3.2 oz (79.5 kg) 12/22/15  174 lb 9.6 oz (79.2 kg).  12/10/15  174 lb 6.4 oz (79.1 kg) 11/17/15  177 lb 12.8 oz (80.6 kg).  11/10/15  182 lb (82.6 kg) 11/03/15  186 lb 14.4 oz (84.8 kg) 10/27/15  191 lb 1.6 oz (86.7 kg).  10/22/15  191 lb 12.8 oz (87 kg).  10/15/15  194 lb (88 kg).  10/06/15  201 lb 14.4 oz (91.6 kg).  Pre-treatment (RT consult date): 09/03/15   General: Male in no acute distress.  Accompanied/Unaccompanied today.  HEENT: Head is atraumatic and normocephalic.  Pupils equal and reactive to light. Conjunctivae clear without exudate.  Sclerae anicteric. Oral mucosa is pink and dry.  Tongue with white coating consistent with radiation changes. Oropharynx is mildly erythematous with healing mucositis.  Lymph: No cervical supraclavicular, or infraclavicular lymphadenopathy noted on palpation.   Neck: No palpable masses. Skin on neck is intact, mildly hyperpigmented, dry and healing.   Cardiovascular: Mild tachycardia.  Respiratory: Clear to auscultation bilaterally. Chest expansion symmetric without accessory muscle use on inspiration or expiration.   GI: Abdomen soft and round. No tenderness to palpation. Bowel sounds normoactive in 4 quadrants. No hepatosplenomegaly. G-tube in place.  GU: Deferred.   Neuro: No focal deficits. Steady gait.   Psych: Flat affect. Tearful at times.  Extremities: No edema. Skin: Warm and dry.    LABORATORY DATA:  None at this visit.  DIAGNOSTIC IMAGING:  None at this visit.    ASSESSMENT & PLAN:  Mr. Fruchey is a pleasant 70 y.o. male with history of  squamous cell carcinoma of the base of tongue, diagnosed in 08/2015; treated with radiation therapy alone; completed treatment on 11/20/15.  Patient presents to survivorship clinic today for routine follow-up after finishing treatment.   1. Cancer of the base of tongue:  Mr. Veness is continuing to recover from definitive radiation therapy to his head & neck. PET scan will be done and results reviewed with patient by Dr. Isidore Moos in 03/2016.  I encouraged Mr. Puls to call me with any questions or concerns before his next appointment at the cancer center.   2. Nausea & vomiting/GERD: Denies dizziness with position changes.  He could be slightly volume-depleted given his recent N&V and tachycardia on exam.  We discussed the possibility of IV fluids, which may be appropriate in the future if he cannot maintain adequate hydration by mouth or via tube.  I recommended that he drink some Gatorade, as often as he can tolerate this weekend.  This will help replete some of his electrolytes that may have been lost with recent nausea/vomiting.  He states that he feels better today and thinks he will be able to tolerate more tube feeding and oral intake.  Encouraged him to push fluids.  For the reported acid, which is certainly common in patients on tube feedings, I started him on Protonix once daily.  Gave instructions that he needs to swallow this pill whole, as it is extended-release.  Recommended he take the Protonix first thing in the morning, about 30 minutes before his first feeding.  I also wrote out a scheduled anti-emetic regimen for him to do for the next week or so.  He will take Zofran at 7 am, about 30 minutes before his first feeding; he will take another dose of Zofran at 5 pm.  In between those 2 doses, I recommended he take a Compazine tablet to prevent breakthrough nausea.  Since the Zofran gave him constipation in the past, encouraged him to titrate his bowel regimen with Miralax, as needed.  We discussed  having him stop taking the HCTZ again for awhile, due to continued concerns for  dehydration. He did not feel comfortable doing this given his history of cardiac stents.  Reinforced that he will need to force more fluids given the diuretic effects of HCTZ.    3. Protein-calorie malnutrition: He has lost about 3.5 lbs since his last visit 2 weeks ago, which is not encouraging.  Likely his weight loss is secondary to decreased tube feeding intake, as well as GI losses from N&V.  I reiterated the importance of adequate nutrition and hydration while he is continuing to heal from radiation therapy.  He is scheduled to see Dory Peru, RD on Monday, 01/12/16 for continued support.  Since we are scheduling nausea medications, I recommended that he do his best to work his way back to instilling 6 cans/day over the weekend, as tolerated.  I will defer to [facility] for her recommendations on continued nutritional support for this patient.    4. Xerostomia: He is very frustrated by the xerostomia secondary to radiation therapy.  He has tried several Biotene products, which have been minimally effective.  He has used the salt water/baking soda rinses. He brushes his tongue often.  He wants to know if there is anything else he can try.  I encouraged him to try the Biotene spray or gel, as he has not tried those specific products. We also discussed having him try pilocarpine.  We discussed that in some patients it is effective, while in others it is not helpful and causes extreme side effects like drenching sweats.  He understands the possible side effects of the pilocarpine and would like to give it a try.  I e-prescribed Pilocarpine 5 mg po/VT BID.  We will reassess in 2 weeks if it is helpful for him.    5. Speech/swallowing: I reinforced how critical it is for him to practice his swallowing exercises as he continues to recover. He understands that in order for him to have his feeding tube removed, he has to be deemed safe to  swallow by Speech Therapy and maintain his weight with oral intake alone.  Stressed the importance of not allowing the swallowing muscles to atrophy as he heals.  He will see Garald Balding, SLP on Monday, 01/12/16 as scheduled.   6. Tobacco/alcohol use: Mr. Salzman quit smoking about 50 years ago. He remains tobacco-free today. Denies any alcohol use.   7. Polymyalgias: He reports that he has had focal joint swelling and pain at times in the past. He tells me he was diagnosed with polymyalgia rheumatica, but is currently not on steroid therapy d/t concerns regarding his diabetes.  I shared that I do not think any of the joint swelling or pain is related to his current head & neck cancer or treatments, but did recommend that he follow-up with his PCP who has been managing this condition in the past.    8. Emotional support:  His affect was a bit flat today and he expressed frustration and feeling overwhelmed with all of the medications, tube feeding regimen, etc. I normalized Mr. Baggarly concerns today and provided support with active listening and validation of his concerns.  He became tearful when talking about losing his beard, as he has had a beard for most of his life.  He states that his appearance changing has been very hard for him.  I offered him support and understanding today, as it is not a trivial concern when head & neck patients experience significant changes to their bodies as a result of treatment.  He has great  support in his wife/caregiver.  I will certainly continue to monitor his mood and we can address the likely situational depression at future visits.  May consider starting Remeron in the future, which may offer dual benefit of increased appetite and improved mood.     Dispo:  -See Dory Peru, RD & Bridgett Larsson on Monday, 01/12/16.  -Return to cancer center to see Survivorship NP in 2 weeks to follow-up on symptoms.     A total of 35 minutes was spent in the face-to-face care of  this patient, with greater than 50% of that time spent in counseling and care-coordination.   Mike Craze, NP [place] [phone]

## 2016-01-12 ENCOUNTER — Other Ambulatory Visit (HOSPITAL_COMMUNITY)
Admission: RE | Admit: 2016-01-12 | Discharge: 2016-01-12 | Disposition: A | Discharge status: Home / Self Care | Payer: Medicare Other | Source: Ambulatory Visit | Attending: Hematology and Oncology | Admitting: Hematology and Oncology

## 2016-01-12 ENCOUNTER — Ambulatory Visit (HOSPITAL_BASED_OUTPATIENT_CLINIC_OR_DEPARTMENT_OTHER): Payer: Medicare Other

## 2016-01-12 ENCOUNTER — Ambulatory Visit: Payer: Medicare Other | Attending: Radiation Oncology

## 2016-01-12 ENCOUNTER — Ambulatory Visit: Payer: Medicare Other | Admitting: Nutrition

## 2016-01-12 DIAGNOSIS — C01 Malignant neoplasm of base of tongue: Secondary | ICD-10-CM | POA: Diagnosis not present

## 2016-01-12 DIAGNOSIS — R1310 Dysphagia, unspecified: Secondary | ICD-10-CM | POA: Diagnosis not present

## 2016-01-12 DIAGNOSIS — E440 Moderate protein-calorie malnutrition: Secondary | ICD-10-CM

## 2016-01-12 LAB — COMPREHENSIVE METABOLIC PANEL
ALBUMIN: 3.7 g/dL (ref 3.5–5.0)
ALK PHOS: 116 U/L (ref 40–150)
ALT: 22 U/L (ref 0–55)
AST: 20 U/L (ref 5–34)
Anion Gap: 13 mEq/L — ABNORMAL HIGH (ref 3–11)
BILIRUBIN TOTAL: 0.72 mg/dL (ref 0.20–1.20)
BUN: 22.3 mg/dL (ref 7.0–26.0)
CALCIUM: 10.7 mg/dL — AB (ref 8.4–10.4)
CO2: 29 mEq/L (ref 22–29)
Chloride: 95 mEq/L — ABNORMAL LOW (ref 98–109)
Creatinine: 1.2 mg/dL (ref 0.7–1.3)
EGFR: 58 mL/min/{1.73_m2} — ABNORMAL LOW (ref >90)
GLUCOSE: 203 mg/dL — AB (ref 70–140)
Potassium: 3.8 mEq/L (ref 3.5–5.1)
SODIUM: 137 meq/L (ref 136–145)
TOTAL PROTEIN: 8.5 g/dL — AB (ref 6.4–8.3)

## 2016-01-12 LAB — PHOSPHORUS: Phosphorus: 3.2 mg/dL (ref 2.5–4.6)

## 2016-01-12 LAB — MAGNESIUM: MAGNESIUM: 1.9 mg/dL (ref 1.5–2.5)

## 2016-01-12 NOTE — Progress Notes (Signed)
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 Weight improved documented as 174 pounds on December 4, increased from 172 pounds, December 1. Patient reports he continues to tolerate 1-1/2 cans Osmolite 1.5 - 4 times a day with 60 cc free water before and after bolus feedings. He continues to give himself 240 cc free water 3 times a day. He is not taking anything by mouth.  Complains of dry heaves/vomiting if he forces himself to eat. Patient continues to complain of dry mouth. Patient very discouraged with his progress.  Nutrition diagnosis: Inadequate oral intake continues.  Intervention: Patient should continue current tube feeding regimen providing greater than 90% of estimated nutrition needs. Encouraged patient to continue anti-nausea medications as prescribed. Recommended that he continue all medications as prescribed by medical team including [place]. Recommended patient begin water by mouth prior to free water boluses between feedings. Encouraged him to try any liquids and soft solids as tolerated.  Provided examples. Questions were answered.  Teach back method used.  Monitoring, evaluation, goals:  Patient will tolerate increased oral intake and tube feedings to minimize weight loss.  Next visit: I will schedule approximately 4 weeks from now.  **Disclaimer: This note was dictated with voice recognition software. Similar sounding words can inadvertently be transcribed and this note may contain transcription errors which may not have been corrected upon publication of note.**

## 2016-01-12 NOTE — Therapy (Signed)
[facility] [address] [place], [place], [address] Phone: [phone]   Fax:  [phone]  Speech Language Pathology Treatment  Patient Details  Name: Benjamin Barr MRN: [id number] Date of Birth: 10-Feb-1945 Referring Provider: Eppie Gibson MD  Encounter Date: 01/12/2016      End of Session - 01/12/16 1111    Visit Number 4   Number of Visits 5   Date for SLP Re-Evaluation 02/06/16   SLP Start Time 34   SLP Stop Time  1104   SLP Time Calculation (min) 28 min   Activity Tolerance Other (comment)  pt expressed frustration; pt depressed(?)      Past Medical History:  Diagnosis Date  . Anemia   . CAD (coronary artery disease)    a. 2008 PCI: Taxus DES  to mRCA, prox & mid LCFX;  b. 10/2014 MV: extensive ischemia in the RCA territory and small area of anterior ischemia;  c. 11/2014 PCI: LM nl, LAD 30ost/prox, 80d, LCX 90ost (3.0x12 Promus Prem DES), OM2 30, RCA 64m(3.0x12 Promus Prem DES), 136mRPDA 90 (PTCA), EF 55-65%.  . Cancer ([facility]   Head & neck ca  . Diverticulosis of colon   . Heart murmur   . History of colon polyps    2014--  hyperplastic, adenoma, and benign polyps  . History of radiation therapy 10/01/15- 11/20/15   Oropharynx/base of neck 70 Gy in 35 fractions  . Hyperlipidemia   . Hypertension   . Myocardial infarction   . Nonproliferative diabetic retinopathy ([facility]  . Obesity   . Peripheral neuropathy ([facility]  . Pneumonia    hx  . Prostate cancer ([facility]  . RBBB (right bundle branch block)   . Skin cancer   . Type 2 diabetes mellitus ([facility]    Past Surgical History:  Procedure Laterality Date  . APPENDECTOMY    . CARDIAC CATHETERIZATION N/A 11/13/2014   Procedure: Left Heart Cath and Coronary Angiography;  Surgeon: [name] JoMartiniqueMD;  [facility];  Service: Cardiovascular;  Laterality: N/A;  . CARDIAC CATHETERIZATION  11/13/2014   Procedure: Coronary Stent Intervention;   Surgeon: [name] JoMartiniqueMD;  [facility];  Service: Cardiovascular;;  . CARDIOVASCULAR STRESS TEST  01-17-2012  dr joMartinique Normal perfusion study/ no ischemia or infarction/  normal LVF and wall motion, ef 54%  . CORONARY ANGIOPLASTY WITH STENT PLACEMENT  05/04/2006   dr joMartinique Severe 2-vessel obstructive CAD/  normal LVF/  PCI with DES to proxima and mid CFX and mRCA (total 3 DES)  . CRYOABLATION N/A 04/01/2014   Procedure: CRYO ABLATION PROSTATE;  Surgeon: SiAilene RudMD;  [facility] Service: [facility]rology;  Laterality: N/A;  . DIRECT LARYNGOSCOPY N/A 09/19/2015   Procedure: DIRECT LARYNGOSCOPY;  Surgeon: JeIzora GalaMD;  [place]: [facility] Service: ENT;  Laterality: N/A;  . ESOPHAGOSCOPY N/A 09/19/2015   Procedure: ESOPHAGOSCOPY;  Surgeon: JeIzora GalaMD;  [facility] Service: ENT;  Laterality: N/A;  . FLOOR OF MOUTH BIOPSY N/A 09/19/2015   Procedure: FLOOR OF MOUTH BIOPSY;  Surgeon: JeIzora GalaMD;  [facility];  Service: ENT;  Laterality: N/A;  . INSERTION OF SUPRAPUBIC CATHETER N/A 04/01/2014   Procedure: INSERTION OF SUPRAPUBIC CATHETER;  Surgeon: SiAilene RudMD;  [facility];  Laterality: N/A;  suprapubic   . IR  GENERIC HISTORICAL  11/13/2015   IR GASTROSTOMY TUBE MOD SED 11/13/2015 [name], DO [facility]-INTERV RAD  . MULTIPLE TOOTH EXTRACTIONS  09/17/2015  . PARTIAL COLECTOMY  80's    There were no vitals filed for this visit.      Subjective Assessment - 01/12/16 1036    Subjective "The biggest thing is the dry mouth." Pt admits to not doing exercises.    Patient is accompained by: --  wife               ADULT SLP TREATMENT - 01/12/16 1039      General Information   Behavior/Cognition Alert;Other (comment)  "I'm ready to just get on a bus and go somewhere!"     Treatment Provided   Treatment provided Dysphagia     Dysphagia Treatment   Temperature  Spikes Noted No   Treatment Methods Therapeutic exercise;Patient/caregiver education   Patient observed directly with PO's Yes   Other treatment/comments Pt relates frustration with post-treatment circumstances so far. Because pt c/o severe xerostomia at night, SLP suggested pt run humidifier at night during sleep. SLP educated pt on exercises he can do without swallowing, with consistent visual and verbal cues. Pt expressed frustration with hearing negative messages from people at this time, after SLP reiterating need for HEP completion, and provided suggestions about how to incr frequency of HEP. SLP did not do any POs with pt as he arrived late from nutrition appointment.     Assessment / Recommendations / Plan   Plan Continue with current plan of care     Progression Toward Goals   Progression toward goals Not progressing toward goals (comment)  pt having difficulty with the mindset to perform HEP          SLP Education - 01/12/16 1110    Education provided Yes   Education Details HEP, late effects head/neck radiation on swallowing, humidifier benefits at night, importance of drinking H2O during the day   Person(s) Educated Patient;Spouse   Methods Explanation;Demonstration;Verbal cues;Handout   Comprehension Verbalized understanding;Returned demonstration;Verbal cues required          SLP Short Term Goals - 01/12/16 1518      SLP SHORT TERM GOAL #1   Title pt will perform HEP with rare min A    Period --  visits   Status Not Met     SLP SHORT TERM GOAL #2   Title pt will tell SLP why he is completing HEP over two sessions   Baseline 11-24-15   Period --  visits   Status Partially Met          SLP Long Term Goals - 01/12/16 1518      SLP LONG TERM GOAL #1   Title pt will complete HEP with occasional min A   Time 2   Period --  vistis   Status Revised     SLP LONG TERM GOAL #2   Title pt will tell SLP 3 s/s aspiration PNA with modified independence   Status  Achieved     SLP LONG TERM GOAL #3   Title pt will tell benefits of a food journal in returning to full PO diet   Time 2   Period --  visits   Status On-going          Plan - 01/12/16 1513    Clinical Impression Statement Pt has con't noncompliance with HEP as prescribed/recommended and continues taking sips of water. Receiving primary nutrtition/hydration via PEG. SLP  req'd to cue pt for portion of HEP not requiring swallowing. Cont'd skilled ST is necessary to assess pt's cont'd procedure with HEP as well as to assess pt's safety with POs. Pt expressed agitation today.    Speech Therapy Frequency --  once every four weeks   Duration --  2 visits   Treatment/Interventions Aspiration precaution training;Pharyngeal strengthening exercises;Diet toleration management by SLP;Compensatory techniques;Trials of upgraded texture/liquids;Cueing hierarchy;Patient/family education;Oral motor exercises;SLP instruction and feedback   Potential to Achieve Goals Good   Potential Considerations Cooperation/participation level   Consulted and Agree with Plan of Care Patient      Patient will benefit from skilled therapeutic intervention in order to improve the following deficits and impairments:   Dysphagia, unspecified type    Problem List Patient Active Problem List   Diagnosis Date Noted  . S/P gastrostomy ([place]) 11/17/2015  . Protein-calorie malnutrition, moderate ([place]) 11/13/2015  . Nausea without vomiting 10/28/2015  . Constipation, acute 10/28/2015  . 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 Weight loss 10/14/2015  . 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 Cancer of base of tongue ([place]) 09/22/2015  . History of prostate cancer 09/03/2015  . Tongue cancer ([place]) 09/03/2015  . Unstable angina ([place]) 11/14/2014  . Abnormal nuclear stress test 11/13/2014  . Angina pectoris ([place]) 11/13/2014  . PMR (polymyalgia rheumatica) ([facility]) 11/02/2014  . Chest pain with  moderate risk for cardiac etiology 10/31/2014  . Diabetes mellitus with diabetic neuropathy, with long-term current use of insulin ([place]) 12/28/2010  . CAD S/P percutaneous coronary angioplasty   . Hypertension   . Hyperlipidemia   . Obesity     Jonatan Wilsey ,MS, CCC-SLP  01/12/2016, 3:20 PM  [facility] [address] [place], [place], [address] Phone: [phone]   Fax:  [phone]   Name: Benjamin Barr MRN: [id number] Date of Birth: 12/05/1945

## 2016-01-12 NOTE — Patient Instructions (Signed)
Try running humidifier at night to combat dry mouth Increase frequency of exercises - start with once/day, then try to get done twice/day.

## 2016-01-15 ENCOUNTER — Telehealth: Payer: Self-pay | Admitting: *Deleted

## 2016-01-15 NOTE — Telephone Encounter (Signed)
Oncology Nurse Navigator Documentation  Per Dr. Wynne Dust NP Mike Craze, called Mr. Tkachenko to inform Dr. Alvy Bimler had no concerns re  Monday's labwork, recommended he hold HCTZ until further notice.  He voiced understanding.  [name], RN, BSN, [place] [facility] Oncology Nurse [place] [facility] [place] [phone]

## 2016-01-22 NOTE — Progress Notes (Signed)
CLINIC:  Survivorship  REASON FOR VISIT:  Routine follow-up for head & neck cancer & to address post-treatment acute survivorship needs.   BRIEF ONCOLOGIC HISTORY:    Tongue cancer ([place])   08/20/2015 Miscellaneous    He was evaluated by ENT      08/22/2015 Imaging    CT neck showed malignant adenopathy right supraclavicular region compatible with metastatic disease. Biopsy recommended. No definite pharyngeal mass lesion identified by CT.       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 No other primary or metastatic cancer. 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       Cancer of base of tongue [facility])      INTERVAL HISTORY:  Mr. Newbanks presents to the Survivorship Clinic for routine follow-up after completing radiation therapy about 2 months ago.  He had a G-tube placed on 11/13/15 to optimize his nutrition.   He is back to instilling 6 cans to tube feeding per day. His N&V is largely resolved. He continues to take Zofran & Compazine as needed.  He tells me his reflux/heartburn symptoms are better since starting Protonix at our last visit.  He feels very full after his tube feedings and finds it difficult to eat or drink much by mouth.  He is now able to swallow all of his medications by mouth.  He did have 1 choking episode recently where a pill "went down the wrong pipe."  Other than that 1 episode, he has been doing well with his oral  meds.   He is frustrated by his recovery and wants to know when he will feel better. He is not sleeping well.  He wakes up often to use the restroom, but otherwise is not sure why he wakes up.  His mouth is dry "all of the time" which is very distressing for him.  He has no appetite.  He wants to know when he can eat foods by mouth because he feels full after his tube feedings.  He feels "tied down" by his tube feeding regimen (feeding, then 2 hours later water flush, then 2 hours later feeding, etc.).  He is not very active at all and spends most of his day sitting.  He has a "rash" on his bottom that he wants me to look at today.    He continues to have arthritis pains, which are secondary to his polymyalgia rheumatica.  He largely has no throat pain.     Pain: "My throat doesn't really hurt at all."   Nutritional Status:  -Intake/Diet: Tube feedings 6 cans/day -Using a feeding tube? Yes -Weight change: GAIN ~3 lbs since 01/09/16  Swallowing:  Not swallowing much by mouth d/t nausea and feeling full.     ADDITIONAL REVIEW OF SYSTEMS:  Review of Systems  Constitutional: Positive  for malaise/fatigue.  HENT:       Xerostomia   Eyes: Negative.   Respiratory: Negative.   Cardiovascular: Negative.   Gastrointestinal: Positive for nausea. Negative for heartburn.  Genitourinary: Negative.   Musculoskeletal:       Reports intermittent myalgias/arthralgias (has history of polymyalgia rheumatica per patient; being managed by PCP).   Skin: Positive for rash (to buttocks).  Neurological: Negative.  Negative for dizziness.  Endo/Heme/Allergies: Negative.   Psychiatric/Behavioral: Positive for depression.     CURRENT MEDICATIONS:  Current Outpatient Prescriptions on File Prior to Visit  Medication Sig Dispense Refill  . clopidogrel (PLAVIX) 75 MG tablet TAKE 1 TABLET EVERY DAY 90 tablet 1  . fluticasone (FLONASE) 50 MCG/ACT nasal spray Place 2 sprays into both nostrils daily.    .  hydrochlorothiazide (HYDRODIURIL) 25 MG tablet Take 1 tablet (25 mg total) by mouth daily. 90 tablet 3  . HYDROcodone-acetaminophen (HYCET) 7.5-325 mg/15 ml solution Take 10-79mL every 4 hours as needed for pain. (Patient not taking: Reported on 01/09/2016) 500 mL 0  . insulin aspart protamine- aspart (NOVOLOG MIX 70/30) (70-30) 100 UNIT/ML injection Inject 10-20 Units into the skin daily with supper. 1 unit before large meal. Per sliding scale    . insulin glargine (LANTUS) 100 UNIT/ML injection Inject 100 Units into the skin daily.     [name] lidocaine (XYLOCAINE) 2 % solution Patient: Mix 1part 2% viscous lidocaine, 1part H20. Swish and swallow 50mL of this mixture, 81min before meals and at bedtime, up to QID (Patient not taking: Reported on 01/09/2016) 100 mL 5  . Nutritional Supplements (FEEDING SUPPLEMENT, OSMOLITE 1.5 CAL,) LIQD Give 1 1/2 cans osmolite 1.5 via PEG QID with 60 cc free water before and after feedings.  In addition, flush tube with 240 cc free water TID between meals. 6 Bottle 0  . ondansetron (ZOFRAN) 8 MG tablet Take 1 tablet (8 mg total) by mouth every 8 (eight) hours as needed for nausea. 60 tablet 3  . pantoprazole (PROTONIX) 40 MG tablet Take 1 tablet (40 mg total) by mouth daily. 90 tablet 3  . pilocarpine (SALAGEN) 5 MG tablet Take 1 tablet (5 mg total) by mouth 2 (two) times daily. 60 tablet 4  . prochlorperazine (COMPAZINE) 10 MG tablet Take 1 tablet (10 mg total) by mouth every 6 (six) hours as needed. 60 tablet 3  . scopolamine (TRANSDERM-SCOP) 1 MG/3DAYS Place 1 patch (1.5 mg total) onto the skin every 3 (three) days. (Patient not taking: Reported on 01/09/2016) 10 patch 0  . senna (SENOKOT) 8.6 MG tablet Take 1 tablet by mouth daily as needed for constipation.    . sodium fluoride (FLUORISHIELD) 1.1 % GEL dental gel Instill one drop of gel per tooth space of fluoride tray. Place over teeth for 5 minutes. Remove. Spit out excess. Repeat nightly. (Patient not taking: Reported  on 01/09/2016) 120 mL 11  . sucralfate (CARAFATE) 1 g tablet Dissolve 1 tablet in 10 mL H20 and swallow up to QID for sore throat. (Patient not taking: Reported on 01/09/2016) 48 tablet 5   No current facility-administered medications on file prior to visit.     ALLERGIES:  Allergies  Allergen Reactions  . No Known Allergies Other (See Comments)     PHYSICAL EXAM:  Vitals:   01/23/16 0931  BP: 137/65  Pulse: 99  Resp: 16  Temp: 99 F (37.2 C)   Filed Weights   01/23/16 0931  Weight: 175 lb 14.4 oz (79.8 kg)  Weight Date  175 lb 14.4 oz (79.8 kg) 01/23/16  172 lb (78 kg) 01/09/16  175 lb 3.2 oz (79.5 kg) 12/22/15  174 lb 9.6 oz (79.2 kg).  12/10/15  174 lb 6.4 oz (79.1 kg) 11/17/15  177 lb 12.8 oz (80.6 kg).  11/10/15  182 lb (82.6 kg) 11/03/15  186 lb 14.4 oz (84.8 kg) 10/27/15  191 lb 1.6 oz (86.7 kg).  10/22/15  191 lb 12.8 oz (87 kg).  10/15/15  194 lb (88 kg).  10/06/15  201 lb 14.4 oz (91.6 kg).  Pre-treatment (RT consult date): 09/03/15   General: Male in no acute distress.  Accompanied/Unaccompanied today.  HEENT: Head is atraumatic and normocephalic.  Pupils equal and reactive to light. Conjunctivae clear without exudate.  Sclerae anicteric. Oral mucosa is pink and dry.  Tongue with white coating consistent with radiation changes. Oropharynx is mildly erythematous with healing mucositis.  Lymph: No cervical supraclavicular, or infraclavicular lymphadenopathy noted on palpation.   Neck: No palpable masses. Skin on neck is intact, mildly hyperpigmented, dry and healing.   Cardiovascular: Mild tachycardia.  Respiratory: Clear to auscultation bilaterally. Chest expansion symmetric without accessory muscle use on inspiration or expiration.   GI: Abdomen soft and round. No tenderness to palpation. Bowel sounds normoactive in 4 quadrants. No hepatosplenomegaly. G-tube in place.  GU: Deferred.   Neuro: No focal deficits. Steady gait.   Psych: Flat affect.  Extremities: No  edema. Skin: Warm and dry. Intergluteal cleft with redness and skin tears; blanches on palpation.     LABORATORY DATA:  None at this visit.  DIAGNOSTIC IMAGING:  None at this visit.    ASSESSMENT & PLAN:  Mr. Cocchiola is a pleasant 70 y.o. male with history of squamous cell carcinoma of the base of tongue, diagnosed in 08/2015; treated with radiation therapy alone; completed treatment on 11/20/15.  Patient presents to survivorship clinic today for routine follow-up after finishing treatment.   1. Cancer of the base of tongue:  Mr. Sorrells is continuing to recover from definitive radiation therapy to his head & neck. PET scan will be done and results reviewed with patient by Dr. Isidore Moos in 03/2016.  I encouraged Mr. Hammons to call me with any questions or concerns before his next appointment at the cancer center.   2. Depression & Sleep disturbance: Clinically, Mr. Mohammed appears to be depressed. Depression is a very common symptom in patients with head & neck cancer.  He continues to express frustration and feeling overwhelmed with all of the medications, tube feeding regimen, etc. I normalized Mr. Pesicka concerns today and provided support with active listening and validation of his concerns. He knows that he may not need these new medications "forever", but they are critical in his continued healing.  We discussed different management strategies for depression.  His sleep disturbance could also be secondary to depression.  He was a bit reluctant at first, but did agree to try a new medication.  I think Remeron would be helpful for him.  Remeron would provide him with the benefit of boosting his mood, helping him sleep better at night, as well as improving appetite. He has great support in his wife/caregiver.  Because he is concerned about possible side effects, I will do dose-escalation with him starting with Remeron 7.5 mg po QHS x 1 week, then increase to 15 mg po QHS thereafter.  He will follow-up with Dr.  Alvy Bimler in about 1 month to see if the Remeron is improving his  symptoms at all.    3. Protein-calorie malnutrition: He has regained some of the weight he lost when he had significant N&V a few weeks ago.  He is back to his goal of instilling 6 cans of tube feedings per day.  I recommended he begin to try some soft foods in small quantities, as tolerated.  He states that he feels so full after his feedings that if he even tries to drink water too close to a feeding he has nausea.  I recommended he wait about 2 hours after his feeding, at which point he could flush his tube with less water and drink more water by mouth if he can.  As he continues to heal, we will be able to decrease the volume of tube feedings as he is able to substitute adequate nutrition by mouth.  I will defer to Dory Peru, RD on her management of his nutrition.   4. Intergluteal cleft friction tear/Post-radiation skin care:  Mr. Watwood appears to have friction skin tears to his intergluteal cleft as it is very red with a small area of open skin.  He is not very active and "sits most of the day."  I strongly recommended that he get up and walk around several times per day to decrease friction tears, as well as pressure ulcers.  There was no evidence of pressure sores to bony prominences to his sacrum, but he is certainly at risk.  Today, he was provided with a bottle of thick protective ointment/barrier cream.  I recommended he apply this cream with every bowel movement and daily to prevent any further skin breakdown.  Of course maintaining adequate nutrition will help prevent further breakdown as well.  Also gave him a bottle of moisturizer to use on his neck, as the skin is very dry secondary to radiation.   5. Xerostomia: Salogen made him sweat profusely, as we had discussed it might, and did not help much with his xerostomia. Encouraged continued use of saliva substitute products and adequate oral care.    6. Speech/swallowing: I  reinforced how critical it is for him to practice his swallowing exercises as he continues to recover. He is able to swallow all of his medications now, which is encouraging.  He understands that in order for him to have his feeding tube removed, he has to be deemed safe to swallow by Speech Therapy and maintain his weight with oral intake alone.  He has not been adhering to these recommendations, but I continued to stress the importance of not allowing the swallowing muscles to atrophy as he heals.   7. Tobacco/alcohol use: Mr. Chretien quit smoking about 50 years ago. He remains tobacco-free today. Denies any alcohol use.      Dispo:  -Return to cancer center to see Dr. Alvy Bimler in mid-January 2018 to follow-up on his symptoms.     A total of 35 minutes was spent in the face-to-face care of this patient, with greater than 50% of that time spent in counseling and care-coordination.   Mike Craze, NP [place] [phone]

## 2016-01-23 ENCOUNTER — Ambulatory Visit (HOSPITAL_BASED_OUTPATIENT_CLINIC_OR_DEPARTMENT_OTHER): Payer: Medicare Other | Admitting: Adult Health

## 2016-01-23 VITALS — BP 137/65 | HR 99 | Temp 99.0°F | Resp 16 | Ht 68.0 in | Wt 175.9 lb

## 2016-01-23 DIAGNOSIS — T148XXA Other injury of unspecified body region, initial encounter: Secondary | ICD-10-CM

## 2016-01-23 DIAGNOSIS — K117 Disturbances of salivary secretion: Secondary | ICD-10-CM

## 2016-01-23 DIAGNOSIS — E46 Unspecified protein-calorie malnutrition: Secondary | ICD-10-CM | POA: Diagnosis not present

## 2016-01-23 DIAGNOSIS — C01 Malignant neoplasm of base of tongue: Secondary | ICD-10-CM

## 2016-01-23 DIAGNOSIS — F32A Depression, unspecified: Secondary | ICD-10-CM

## 2016-01-23 DIAGNOSIS — G479 Sleep disorder, unspecified: Secondary | ICD-10-CM

## 2016-01-23 DIAGNOSIS — F329 Major depressive disorder, single episode, unspecified: Secondary | ICD-10-CM | POA: Diagnosis not present

## 2016-01-23 MED ORDER — MIRTAZAPINE 15 MG PO TABS: 15.0000 mg | ORAL_TABLET | Freq: Every day | ORAL | 1 refills | Status: DC

## 2016-01-23 NOTE — Progress Notes (Signed)
Prescription for Remeron 15 mg called in to [facility].  Patient is aware.

## 2016-01-28 ENCOUNTER — Encounter: Payer: Self-pay | Admitting: Adult Health

## 2016-02-10 ENCOUNTER — Encounter: Payer: Self-pay | Admitting: Nutrition

## 2016-02-10 ENCOUNTER — Ambulatory Visit: Payer: Medicare Other

## 2016-02-10 NOTE — Progress Notes (Signed)
Nutrition Follow-up:  Nutrition follow-up completed with patient and wife for right neck squamous cell cancer with unknown primary.   Weight noted on December 15th of 175 pounds 14.4 oz increased from December 4th visit of 174 pounds.   Patient reports that he continues to take 1.5 cans of osmolite 1.5 4 times per day with 64ml of water before and after bolus feeding.  Patient reports that he continues to take 147ml of water three times per day between feeding (9am, 1pm and 5pm).  Patient reports that he is successfully taking pills with water orally.  He has been seen by a SLP and has been instructed to take thin liquids and soft foods.  Reports that he has tried soups, ate little bit of beef last night.  Reports feeling of fullness with tube feeding and water flush and taking foods orally.  Patient reports taste changes and feels that if he could just find something that taste good he could eat it.  Admits to feeling scared to take foods orally and has issues with dry mouth.  Patient reports that biotene works best for dry mouth and has been trying to keep mouth clean as well.    Medications: reviewed  Labs: reviewed   NUTRITION DIAGNOSIS: Inadequate oral intake continues    INTERVENTION:   Discussed soft, easy to swallow foods and ways to help changes in taste.  Handouts provided.   Encouraged patient to try soft foods at meal times and if able to eat leave off 1/2 can of tube feeding.   Also discussed drinking water flush between feedings vs giving through tube.  Patient confident that he can drink the water between feeding without problems.   Patient with concerns about changes in blood glucose when starts to eat orally.  "I have a good regimen now and when I start to eat it will change."  Encouraged patient to keep log of blood glucose and check blood glucose more frequently and to discuss any changes in blood glucose with MD.      MONITORING, EVALUATION, GOAL: Patient will tolerate  increased oral intake and tube feedings to minimize weight loss   NEXT VISIT: January 18th at 1:45pm in clinic.    Xaine Sansom B. Zenia Resides, [place], [place] (pager)

## 2016-02-19 ENCOUNTER — Other Ambulatory Visit: Payer: Self-pay | Admitting: Adult Health

## 2016-02-19 DIAGNOSIS — C01 Malignant neoplasm of base of tongue: Secondary | ICD-10-CM

## 2016-02-19 DIAGNOSIS — F32A Depression, unspecified: Secondary | ICD-10-CM

## 2016-02-19 DIAGNOSIS — F329 Major depressive disorder, single episode, unspecified: Secondary | ICD-10-CM

## 2016-02-23 ENCOUNTER — Other Ambulatory Visit: Payer: Self-pay | Admitting: Hematology and Oncology

## 2016-02-23 ENCOUNTER — Ambulatory Visit: Payer: Medicare Other | Attending: Radiation Oncology

## 2016-02-23 ENCOUNTER — Ambulatory Visit: Payer: Medicare Other

## 2016-02-23 DIAGNOSIS — R1310 Dysphagia, unspecified: Secondary | ICD-10-CM | POA: Diagnosis present

## 2016-02-23 DIAGNOSIS — L599 Disorder of the skin and subcutaneous tissue related to radiation, unspecified: Secondary | ICD-10-CM | POA: Insufficient documentation

## 2016-02-23 DIAGNOSIS — I890 Lymphedema, not elsewhere classified: Secondary | ICD-10-CM | POA: Diagnosis present

## 2016-02-23 DIAGNOSIS — R29898 Other symptoms and signs involving the musculoskeletal system: Secondary | ICD-10-CM | POA: Diagnosis present

## 2016-02-23 DIAGNOSIS — C029 Malignant neoplasm of tongue, unspecified: Secondary | ICD-10-CM

## 2016-02-23 NOTE — Therapy (Signed)
[facility] [address] [place], [place], [address] Phone: [phone]   Fax:  [phone]  Speech Language Pathology Treatment  Patient Details  Name: Benjamin Barr MRN: [id number] Date of Birth: 02/05/1946 Referring Provider: Eppie Gibson MD  Encounter Date: 02/23/2016      End of Session - 02/23/16 1639    Visit Number 5   Number of Visits 8   Date for SLP Re-Evaluation 04-27-16   SLP Start Time 1330   SLP Stop Time  1410   SLP Time Calculation (min) 40 min   Activity Tolerance Patient tolerated treatment well      Past Medical History:  Diagnosis Date  . Anemia   . CAD (coronary artery disease)    a. 2008 PCI: Taxus DES  to mRCA, prox & mid LCFX;  b. 10/2014 MV: extensive ischemia in the RCA territory and small area of anterior ischemia;  c. 11/2014 PCI: LM nl, LAD 30ost/prox, 80d, LCX 90ost (3.0x12 Promus Prem DES), OM2 30, RCA 83m(3.0x12 Promus Prem DES), 192mRPDA 90 (PTCA), EF 55-65%.  . Cancer ([place]   Head & neck ca  . Diverticulosis of colon   . Heart murmur   . History of colon polyps    2014--  hyperplastic, adenoma, and benign polyps  . History of radiation therapy 10/01/15- 11/20/15   Oropharynx/base of neck 70 Gy in 35 fractions  . Hyperlipidemia   . Hypertension   . Myocardial infarction   . Nonproliferative diabetic retinopathy ([facility]  . Obesity   . Peripheral neuropathy ([facility]  . Pneumonia    hx  . Prostate cancer ([facility]  . RBBB (right bundle branch block)   . Skin cancer   . Type 2 diabetes mellitus ([facility]    Past Surgical History:  Procedure Laterality Date  . APPENDECTOMY    . CARDIAC CATHETERIZATION N/A 11/13/2014   Procedure: Left Heart Cath and Coronary Angiography;  Surgeon: [name] JoMartiniqueMD;  [facility];  Service: Cardiovascular;  Laterality: N/A;  . CARDIAC CATHETERIZATION  11/13/2014   Procedure: Coronary Stent Intervention;  Surgeon: [name] JoMartinique MD;  [facility];  Service: Cardiovascular;;  . CARDIOVASCULAR STRESS TEST  01-17-2012  dr joMartinique Normal perfusion study/ no ischemia or infarction/  normal LVF and wall motion, ef 54%  . CORONARY ANGIOPLASTY WITH STENT PLACEMENT  05/04/2006   dr joMartinique Severe 2-vessel obstructive CAD/  normal LVF/  PCI with DES to proxima and mid CFX and mRCA (total 3 DES)  . CRYOABLATION N/A 04/01/2014   Procedure: CRYO ABLATION PROSTATE;  Surgeon: SiAilene RudMD;  [facility];  Laterality: N/A;  . DIRECT LARYNGOSCOPY N/A 09/19/2015   Procedure: DIRECT LARYNGOSCOPY;  Surgeon: JeIzora GalaMD;  Location: [facility] Service: ENT;  Laterality: N/A;  . ESOPHAGOSCOPY N/A 09/19/2015   Procedure: ESOPHAGOSCOPY;  Surgeon: JeIzora GalaMD;  Location: [facility] Service: ENT;  Laterality: N/A;  . FLOOR OF MOUTH BIOPSY N/A 09/19/2015   Procedure: FLOOR OF MOUTH BIOPSY;  Surgeon: JeIzora GalaMD;  [facility];  Service: ENT;  Laterality: N/A;  . INSERTION OF SUPRAPUBIC CATHETER N/A 04/01/2014   Procedure: INSERTION OF SUPRAPUBIC CATHETER;  Surgeon: SiAilene RudMD;  [facility];  Laterality: N/A;  suprapubic   . IR GENERIC HISTORICAL  11/13/2015  IR GASTROSTOMY TUBE MOD SED 11/13/2015 [name], DO [facility]  . MULTIPLE TOOTH EXTRACTIONS  09/17/2015  . PARTIAL COLECTOMY  80's    There were no vitals filed for this visit.      Subjective Assessment - 02/23/16 1337    Subjective "The pills, I've been taking my mouth. I can swallow fine as long as I have liquids." Complains of xerostomia.               ADULT SLP TREATMENT - 02/23/16 1338      General Information   Behavior/Cognition Alert;Cooperative     Treatment Provided   Treatment provided Dysphagia     Dysphagia Treatment   Temperature Spikes Noted No   Respiratory Status Room air   Oral Cavity - Dentition Missing  dentition   Treatment Methods Therapeutic exercise;Patient/caregiver education;Skilled observation   Patient observed directly with PO's Yes   Type of PO's observed Dysphagia 1 (puree);Thin liquids   Liquids provided via Cup   Oral Phase Signs & Symptoms --  none noted   Pharyngeal Phase Signs & Symptoms --  none noted   Other treatment/comments Pt reports trying softer foods but needs to have water/liquid with foods in last week. Peaches without juice were challengning with pharyngeal clearance - with pudding it was somewhat easier.  SLP suggested pureed foods/soft solids and thin liquids, which pt did not exhibit overt s/s aspiration (bites sizes of sliced ham, applesauce, and water). As pt did not exhibit any overt s/s of aspiration during session today, SLP told pt he must think about eating for fuel, adn to get the tube out. "It (tube feeds) is convenient," pt remarked. Pt told SLP he had tried eggs "a while ago" but hasn't tried since. SLP stressed continual attempting of different pureed and soft solid foods.      Assessment / Recommendations / Plan   Plan Continue with current plan of care     Progression Toward Goals   Progression toward goals Progressing toward goals            SLP Short Term Goals - 02/23/16 1642      SLP SHORT TERM GOAL #1   Title pt will perform HEP with rare min A    Period --  visits   Status Not Met     SLP SHORT TERM GOAL #2   Title pt will tell SLP why he is completing HEP over two sessions   Baseline 11-24-15   Period --  visits   Status Partially Met          SLP Long Term Goals - 02/23/16 1642      SLP LONG TERM GOAL #1   Title pt will complete HEP with rare min A   Time 3   Period --  vistis   Status On-going  not met- renewed 02-23-16     SLP LONG TERM GOAL #2   Title pt will tell SLP 3 s/s aspiration PNA with modified independence   Status Achieved     SLP LONG TERM GOAL #3   Title pt will tell benefits of a food journal  in returning to full PO diet   Time 3   Period --  visits   Status On-going  not met - renewed 02-23-16     SLP LONG TERM GOAL #4   Title pt will tell SLP why he is completing HEP   Time 3   Period --  visits   Status  New          Plan - 02/23/16 1639    Clinical Impression Statement Pt has con't noncompliant with HEP as prescribed/recommended and SLP continues to stress need for following HEP. See goal update for details. Long term goals not met will be renewed. Pt cont to receive primary nutrtition/hydration via PEG but has attempted more foods in the past 1-2 weeks. SLP continues need to cue pt for proper completion of HEP. Cont'd skilled ST is necessary to assess pt's cont'd procedure with HEP as well as to assess pt's safety with POs.    Speech Therapy Frequency --  once every four weeks   Duration --  3 visits   Treatment/Interventions Aspiration precaution training;Pharyngeal strengthening exercises;Diet toleration management by SLP;Compensatory techniques;Trials of upgraded texture/liquids;Cueing hierarchy;Patient/family education;Oral motor exercises;SLP instruction and feedback   Potential to Achieve Goals Good   Potential Considerations Cooperation/participation level   Consulted and Agree with Plan of Care Patient      Patient will benefit from skilled therapeutic intervention in order to improve the following deficits and impairments:   Dysphagia, unspecified type - Plan: SLP plan of care cert/re-cert    Problem List Patient Active Problem List   Diagnosis Date Noted  . Acquired lymphedema 02/23/2016  . S/P gastrostomy ([place]) 11/17/2015  . Protein-calorie malnutrition, moderate ([place]) 11/13/2015  . Nausea without vomiting 10/28/2015  . Constipation, acute 10/28/2015  . 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 Weight loss 10/14/2015  . 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 Cancer of base of tongue ([place]) 09/22/2015  . History of  prostate cancer 09/03/2015  . Tongue cancer ([place]) 09/03/2015  . Unstable angina ([place]) 11/14/2014  . Abnormal nuclear stress test 11/13/2014  . Angina pectoris ([place]) 11/13/2014  . PMR (polymyalgia rheumatica) (HCC) 11/02/2014  . Chest pain with moderate risk for cardiac etiology 10/31/2014  . Diabetes mellitus with diabetic neuropathy, with long-term current use of insulin ([place]) 12/28/2010  . CAD S/P percutaneous coronary angioplasty   . Hypertension   . Hyperlipidemia   . Obesity     Dauntae Derusha ,MS, CCC-SLP  02/23/2016, 4:46 PM  [facility] [address] [place], [place], [address] Phone: [phone]   Fax:  [phone]   Name: Benjamin Barr MRN: [id number] Date of Birth: [birth year]

## 2016-02-23 NOTE — Patient Instructions (Signed)
Continue to eat in order to maintain intake by mouth. You will be able to have the tube removed when you can maintain weight without use of the tube.

## 2016-02-26 ENCOUNTER — Telehealth: Payer: Self-pay | Admitting: Hematology and Oncology

## 2016-02-26 ENCOUNTER — Telehealth: Payer: Self-pay | Admitting: *Deleted

## 2016-02-26 ENCOUNTER — Encounter: Payer: Self-pay | Admitting: Nutrition

## 2016-02-26 ENCOUNTER — Ambulatory Visit: Payer: Self-pay | Admitting: Hematology and Oncology

## 2016-02-26 ENCOUNTER — Other Ambulatory Visit: Payer: Self-pay

## 2016-02-26 NOTE — Telephone Encounter (Signed)
Called patient to inform of Pet Scan on  03-11-16- arrival time- 6:30 am, pt. To be NPO - 6 hrs. Prior to test and to hold his morning insulin @ [facility] and his fu appt. With Dr. Isidore Moos on 03-12-16 to get his results, spoke with patient and he is aware of these appts.

## 2016-02-26 NOTE — Telephone Encounter (Signed)
Called patient to inform of test for 03-11-16 - arrival time 6:30 am, spoke with patient and he is aware of this test.

## 2016-02-26 NOTE — Telephone Encounter (Signed)
S/w pt, gave appt for 2/12 @ 2.15pm.

## 2016-03-04 ENCOUNTER — Telehealth: Payer: Self-pay | Admitting: *Deleted

## 2016-03-04 NOTE — Telephone Encounter (Signed)
Oncology Nurse Navigator Documentation  Spoke with Benjamin Barr, he confirmed he has not heard from [place] re appt for lymphedema tmt.  I sent email to [facility], provided phone # to Benjamin Barr, encouraged him to call if he doesn't receive call in 24 hrs.  I confirmed location/date/time/NPO status for his 2/1 PET.  He understands to call me with needs/concerns.  [name], RN, BSN, [facility] Oncology Nurse [place] [facility] [place] [phone]

## 2016-03-09 ENCOUNTER — Ambulatory Visit: Payer: Medicare Other | Admitting: Physical Therapy

## 2016-03-09 DIAGNOSIS — L599 Disorder of the skin and subcutaneous tissue related to radiation, unspecified: Secondary | ICD-10-CM

## 2016-03-09 DIAGNOSIS — R29898 Other symptoms and signs involving the musculoskeletal system: Secondary | ICD-10-CM

## 2016-03-09 DIAGNOSIS — I890 Lymphedema, not elsewhere classified: Secondary | ICD-10-CM

## 2016-03-09 DIAGNOSIS — R1310 Dysphagia, unspecified: Secondary | ICD-10-CM | POA: Diagnosis not present

## 2016-03-09 NOTE — Patient Instructions (Signed)
Tie the foam chip pick at the top of your head with a feeling of snugness on your neck where the swelling is--not that it is tight or feels uncomfortable or choking. Try to keep it on 2-4 hours a day.  Wear it when you're awake, at least at first.  When you take it off, look in the mirror to see if it has reduced the swelling, if you see little indentations from the foam cubes, and feel it to see if the tissue feels softer. All of these would be positive outcomes.

## 2016-03-09 NOTE — Progress Notes (Signed)
  Benjamin Barr presents for follow up of radiation completed 11/20/15 to his Oropharynx/ base of neck  Pain issues, if any: He reports pain to his Right hand and arm. He says the pain comes suddenly. He needs to prop him arm up at night for relief.  Using a feeding tube?: Yes, he is instilling 1 1/2 cans of osmolite four times daily. Weight changes, if any:  Swallowing issues, if any: He can swallow well per his report if his food is moistened. He reports taste changes and a severe dry mouth which prevent him from eating. He is using biotine which helps for about an hour.He becomes full very quickly.  Smoking or chewing tobacco? No Using fluoride trays daily? Yes Last ENT visit was on: Dr. Constance Holster not since treatment.  Other notable issues, if any:  He has a skin cancer to his nose that is to be removed in March.  PET 03/11/16   BP 130/82   Pulse 99   Temp 98.8 F (37.1 C)   Ht 5\' 8"  (1.727 m)   Wt 180 lb 3.2 oz (81.7 kg)   SpO2 97% Comment: room air  BMI 27.40 kg/m    Wt Readings from Last 3 Encounters:  03/12/16 180 lb 3.2 oz (81.7 kg)  01/23/16 175 lb 14.4 oz (79.8 kg)  01/09/16 172 lb (78 kg)

## 2016-03-09 NOTE — Therapy (Signed)
[place], [place], [address] Phone: [phone]   Fax:  [phone]  Physical Therapy Evaluation  Patient Details  Name: Benjamin Barr MRN: [id number] Date of Birth: 1945-12-12 Referring Provider: Dr. Heath Lark  Encounter Date: 03/09/2016      PT End of Session - 03/09/16 1442    Visit Number 1   Number of Visits 5   Date for PT Re-Evaluation 04/09/16   PT Start Time [id number]   PT Stop Time 1436   PT Time Calculation (min) 41 min   Activity Tolerance Patient tolerated treatment well   Behavior During Therapy [facility] for tasks assessed/performed      Past Medical History:  Diagnosis Date  . Anemia   . CAD (coronary artery disease)    a. 2008 PCI: Taxus DES  to mRCA, prox & mid LCFX;  b. 10/2014 MV: extensive ischemia in the RCA territory and small area of anterior ischemia;  c. 11/2014 PCI: LM nl, LAD 30ost/prox, 80d, LCX 90ost (3.0x12 Promus Prem DES), OM2 30, RCA 40m (3.0x12 Promus Prem DES), 62m, RPDA 90 (PTCA), EF 55-65%.  . Cancer ([place])    Head & neck ca  . Diverticulosis of colon   . Heart murmur   . History of colon polyps    2014--  hyperplastic, adenoma, and benign polyps  . History of radiation therapy 10/01/15- 11/20/15   Oropharynx/base of neck 70 Gy in 35 fractions  . Hyperlipidemia   . Hypertension   . Myocardial infarction   . Nonproliferative diabetic retinopathy ([place])   . Obesity   . Peripheral neuropathy ([place])   . Pneumonia    hx  . Prostate cancer ([place])   . RBBB (right bundle branch block)   . Skin cancer   . Type 2 diabetes mellitus ([place])     Past Surgical History:  Procedure Laterality Date  . APPENDECTOMY    . CARDIAC CATHETERIZATION N/A 11/13/2014   Procedure: Left Heart Cath and Coronary Angiography;  Surgeon: [name] [name], MD;  Location: [facility];  Service: Cardiovascular;  Laterality: N/A;  . CARDIAC CATHETERIZATION  11/13/2014   Procedure: Coronary  Stent Intervention;  Surgeon: [name] [name], MD;  Location: [facility];  Service: Cardiovascular;;  . CARDIOVASCULAR STRESS TEST  01-17-2012  dr [name]   Normal perfusion study/ no ischemia or infarction/  normal LVF and wall motion, ef 54%  . CORONARY ANGIOPLASTY WITH STENT PLACEMENT  05/04/2006   dr [name]   Severe 2-vessel obstructive CAD/  normal LVF/  PCI with DES to proxima and mid CFX and mRCA (total 3 DES)  . CRYOABLATION N/A 04/01/2014   Procedure: CRYO ABLATION PROSTATE;  Surgeon: [name], MD;  Location[facility];  Service: Urology;  Laterality: N/A;  . DIRECT LARYNGOSCOPY N/A 09/19/2015   Procedure: DIRECT LARYNGOSCOPY;  Surgeon: Izora Gala, MD;  Location: [facility];  Service: ENT;  Laterality: N/A;  . ESOPHAGOSCOPY N/A 09/19/2015   Procedure: ESOPHAGOSCOPY;  Surgeon: Izora Gala, MD;  Location: [facility];  Service: ENT;  Laterality: N/A;  . FLOOR OF MOUTH BIOPSY N/A 09/19/2015   Procedure: FLOOR OF MOUTH BIOPSY;  Surgeon: Izora Gala, MD;  Location[facility];  Service: ENT;  Laterality: N/A;  . INSERTION OF SUPRAPUBIC CATHETER N/A 04/01/2014   Procedure: INSERTION OF SUPRAPUBIC CATHETER;  Surgeon: [name], MD;  Location: [facility];  Service: Urology;  Laterality: N/A;  suprapubic   .  IR GENERIC HISTORICAL  11/13/2015   IR GASTROSTOMY TUBE MOD SED 11/13/2015 [name], DO [facility]  . MULTIPLE TOOTH EXTRACTIONS  09/17/2015  . PARTIAL COLECTOMY  80's    There were no vitals filed for this visit.       Subjective Assessment - 03/09/16 1356    Subjective Points out swelling in the front of his neck.   Pertinent History Prostate cancer treated with cryotherapy about year ago; IDDM, polymyalgia rheumatica; CAD s/p angioplasty (2 stents placed October 2016).  Bloody nasal congestion treated with antibiotics; developed a cough and was diagnosed with pneumonia; then had mass noted at right neck which was biopsied and found to  be squamous cell carcinoma with unknown primary.  h/o foot and leg swelling that he says has mostly resolved and occurred at the time of his polymyalgia rheumatica.  Had XRT for neck, 35 treatments completeed mid-October 2017.  Still has the feeding tube.  Not eating much because taste buds are off and doesn't have appetite.  Lost 25 lbs. early on and has gained back about 5 lbs. Neck fullness developed about two weeks ago.  This has been variable.   Patient Stated Goals get rid of the fullness at the front of his neck and look normal again   Currently in Pain? 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 Heath Lark     Precautions   Precautions Other (comment)   Precaution Comments cancer precautions     Restrictions   Weight Bearing Restrictions No     Balance Screen   Has the patient fallen in the past 6 months No   Has the patient had a decrease in activity level because of a fear of falling?  No   Is the patient reluctant to leave their home because of a fear of falling?  No     Home Environment   Living Environment Private residence   Type of [place] One level     Prior Function   Level of Independence Independent   Leisure no exercise currently; used to walk the dog about 2 miles (in one hour) and play golf, but hasn't done recently due to recent medical history; has done cardiac rehab in the past year     Cognition   Overall Cognitive Status Within Functional Limits for tasks assessed  ? memory problems     Functional Tests   Functional tests Sit to Stand     Sit to Stand   Comments 11 times in 30 seconds     Posture/Postural Control   Posture/Postural Control Postural limitations   Postural Limitations Rounded Shoulders;Forward head  significant forward head     AROM    Cervical Flexion full   Cervical Extension 75% loss   Cervical - Right Side Bend 75% loss   Cervical - Left Side Bend 75% loss   Cervical - Right Rotation 25% loss   Cervical - Left Rotation 25% loss     Palpation   Palpation comment fullness at anterior neck is fairly soft           LYMPHEDEMA/ONCOLOGY QUESTIONNAIRE - 03/09/16 1411      Type   Cancer Type head & neck squamous cell  carcinoma     Date Lymphedema/Swelling Started   Date 02/23/16  approx.     Treatment   Past Radiation Treatment Yes   Date 11/23/15  approx.   Body Site neck     Head and Neck   4 cm superior to sternal notch around neck 40.9 cm   6 cm superior to sternal notch around neck 41.9 cm   8 cm superior to sternal notch around neck 42.9 cm   Other no longer has a mass at right neck; says he is 20 lbs. lighter than before   Other 45 cm. at 10 cm. superior to sternal notch around neck                [facility] Adult PT Treatment/Exercise - 03/09/16 0001      Self-Care   Self-Care Other Self-Care Comments   Other Self-Care Comments  fashioned foam chip pack in stockinette and tied it on patient's head, educating about its use                PT Education - 03/09/16 1441    Education provided Yes   Education Details about use of foam chip pack, availability of manufactured compression garments; began education on longterm lymphedema management   Person(s) Educated Patient   Methods Explanation;Handout   Comprehension Verbalized understanding                [place] [place] - 03/09/16 1449      CC Long Term Goal  #1   Title Patient and/or his wife will be knowledgeable about performing manual lymph drainage for the neck.   Time 3   Period Weeks   Status New     CC Long Term Goal  #2   Title Patient will be knowledgeable about manufactured compression garment options, where and how to obtain one.   Time 3   Period Weeks   Status New     CC Long Term Goal  #3    Title Pt. will have reviewed neck ROM and the benefit of working on his neck flexibility.   Time 3   Period Weeks   Status New         Head and Neck Clinic Goals - 09/16/15 1029      Patient will be able to verbalize understanding of a home exercise program for cervical range of motion, posture, and walking.    Status Achieved     Patient will be able to verbalize understanding of proper sitting and standing posture.    Status Achieved     Patient will be able to verbalize understanding of lymphedema risk and availability of treatment for this condition.    Status Achieved           Plan - 03/09/16 1442    Clinical Impression Statement Patient with a complex medical history is now s/p XRT for head & neck squamous cell carcinoma, and developed lymphedema about 2 weeks ago.  HIs neck swelling is visible and it is fairly soft on palpation.  He seems to accept information about treating this with resignation.  He feels unable to come to therapy more often than once a week, so we will focus on self-care education. His neck AROM is quite limited.  This is a re-evaluation, as patient was seen in early August in head & neck clinic prior to his radiation treatment.   Rehab Potential Good   PT Frequency 1x / week   PT Duration  3 weeks  to 4 weeks if needed   PT Treatment/Interventions ADLs/Self Care Home Management;DME Instruction;Therapeutic exercise;Patient/family education;Manual techniques;Manual lymph drainage;Compression bandaging;Passive range of motion;Taping   PT Next Visit Plan assess benefit of foam chip pack, begin manual lymph drainage, instruction in same, education about manufactured compression garment options, and later reinforce his neck ROM exercise   Consulted and Agree with Plan of Care Patient      Patient will benefit from skilled therapeutic intervention in order to improve the following deficits and impairments:  Increased edema, Decreased  knowledge of precautions, Decreased knowledge of use of DME, Decreased range of motion  Visit Diagnosis: Lymphedema, not elsewhere classified - Plan: PT plan of care cert/re-cert  Disorder of the skin and subcutaneous tissue related to radiation, unspecified - Plan: PT plan of care cert/re-cert  Other symptoms and signs involving the musculoskeletal system - Plan: PT plan of care cert/re-cert      G-Codes - [id number]    Functional Assessment Tool Used clinical judgement   Functional Limitation Self care   Self Care Current Status [id number]) At least 80 percent but less than 100 percent impaired, limited or restricted   Self Care Goal Status [id number]) At least 1 percent but less than 20 percent impaired, limited or restricted       Problem List Patient Active Problem List   Diagnosis Date Noted  . Acquired lymphedema 02/23/2016  . S/P gastrostomy ([place]) 11/17/2015  . Protein-calorie malnutrition, moderate ([place]) 11/13/2015  . Nausea without vomiting 10/28/2015  . Constipation, acute 10/28/2015  . 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 Weight loss 10/14/2015  . 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 Cancer of base of tongue ([place]) 09/22/2015  . History of prostate cancer 09/03/2015  . Tongue cancer ([place]) 09/03/2015  . Unstable angina ([place]) 11/14/2014  . Abnormal nuclear stress test 11/13/2014  . Angina pectoris ([place]) 11/13/2014  . PMR (polymyalgia rheumatica) (HCC) 11/02/2014  . Chest pain with moderate risk for cardiac etiology 10/31/2014  . Diabetes mellitus with diabetic neuropathy, with long-term current use of insulin ([place]) 12/28/2010  . CAD S/P percutaneous coronary angioplasty   . Hypertension   . Hyperlipidemia   . Obesity     Debra Calabretta 03/09/2016, 2:56 PM  [place], [place], [address] Phone: [phone]   Fax:  [phone]  Name: Benjamin Barr MRN: [id number] Date of Birth: 1945-10-12   Serafina Royals, PT 03/09/16 2:56 PM

## 2016-03-11 ENCOUNTER — Other Ambulatory Visit: Payer: Self-pay | Admitting: Radiation Oncology

## 2016-03-11 ENCOUNTER — Encounter (HOSPITAL_COMMUNITY)
Admission: RE | Admit: 2016-03-11 | Discharge: 2016-03-11 | Disposition: A | Discharge status: Home / Self Care | Payer: Medicare Other | Source: Ambulatory Visit | Attending: Radiation Oncology | Admitting: Radiation Oncology

## 2016-03-11 DIAGNOSIS — C01 Malignant neoplasm of base of tongue: Secondary | ICD-10-CM | POA: Insufficient documentation

## 2016-03-11 LAB — GLUCOSE, CAPILLARY: GLUCOSE-CAPILLARY: 70 mg/dL (ref 65–99)

## 2016-03-11 MED ORDER — FLUDEOXYGLUCOSE F - 18 (FDG) INJECTION
8.5400 | Freq: Once | INTRAVENOUS | Status: AC | PRN
  Administered 2016-03-11: 8.54 via INTRAVENOUS

## 2016-03-12 ENCOUNTER — Ambulatory Visit
Admission: RE | Admit: 2016-03-12 | Discharge: 2016-03-12 | Disposition: A | Discharge status: Home / Self Care | Payer: Medicare Other | Source: Ambulatory Visit | Attending: Radiation Oncology | Admitting: Radiation Oncology

## 2016-03-12 ENCOUNTER — Encounter: Payer: Self-pay | Admitting: *Deleted

## 2016-03-12 ENCOUNTER — Encounter: Payer: Self-pay | Admitting: Radiation Oncology

## 2016-03-12 VITALS — BP 130/82 | HR 99 | Temp 98.8°F | Ht 68.0 in | Wt 180.2 lb

## 2016-03-12 DIAGNOSIS — Z7982 Long term (current) use of aspirin: Secondary | ICD-10-CM | POA: Insufficient documentation

## 2016-03-12 DIAGNOSIS — Z923 Personal history of irradiation: Secondary | ICD-10-CM | POA: Diagnosis not present

## 2016-03-12 DIAGNOSIS — K8020 Calculus of gallbladder without cholecystitis without obstruction: Secondary | ICD-10-CM | POA: Insufficient documentation

## 2016-03-12 DIAGNOSIS — C01 Malignant neoplasm of base of tongue: Secondary | ICD-10-CM | POA: Diagnosis present

## 2016-03-12 DIAGNOSIS — K769 Liver disease, unspecified: Secondary | ICD-10-CM | POA: Diagnosis not present

## 2016-03-12 DIAGNOSIS — I2510 Atherosclerotic heart disease of native coronary artery without angina pectoris: Secondary | ICD-10-CM | POA: Diagnosis not present

## 2016-03-12 DIAGNOSIS — N400 Enlarged prostate without lower urinary tract symptoms: Secondary | ICD-10-CM | POA: Diagnosis not present

## 2016-03-12 DIAGNOSIS — Z794 Long term (current) use of insulin: Secondary | ICD-10-CM | POA: Insufficient documentation

## 2016-03-12 DIAGNOSIS — C4482 Squamous cell carcinoma of overlapping sites of skin: Secondary | ICD-10-CM | POA: Diagnosis not present

## 2016-03-12 DIAGNOSIS — N281 Cyst of kidney, acquired: Secondary | ICD-10-CM | POA: Insufficient documentation

## 2016-03-12 DIAGNOSIS — Z931 Gastrostomy status: Secondary | ICD-10-CM | POA: Insufficient documentation

## 2016-03-12 DIAGNOSIS — I700 Atherosclerosis of aorta: Secondary | ICD-10-CM | POA: Diagnosis not present

## 2016-03-12 NOTE — Progress Notes (Signed)
Radiation Oncology         [phone] ________________________________  Name: Benjamin Barr MRN: [id number]  Date: 03/12/2016  DOB: Dec 25, 1945  Follow-Up Visit Note  CC: [name], MD  Aura Dials, MD  Diagnosis and Prior Radiotherapy:    C01 - base of tongue cancer   ICD-9-CM ICD-10-CM   1. Lesion of liver 573.8 K76.9 MR LIVER W WO CONTRAST  2. Cancer of base of tongue ([place]) 141.0 C01 MR LIVER W WO CONTRAST     10/01/15 - 11/20/15 : Oropharynx and Bilateral Neck treated to 70 Gy in 35 fractions  CHIEF COMPLAINT:  Here due to PET results  Narrative:  The patient returns today for follow up of radiation completed on 11/20/15.  On review of systems, the patient reports pain to his right hand and arm. He reports this pain comes suddenly, and at night he must prop his arm up for relief. The patient is currently instilling 1.5 cans of osmolite 4 x daily. He reports he can swallow well if his food is moistened. He reports taste changes and a severe dry mouth which sometimes prevent him from eating. He is using Biotene which he reports helps him for about an hour. The patient reports he becomes full quickly when eating. The patient denies smoking or chewing tobacco. He reports he is using daily fluoride trays. The patient has not met with Dr. Constance Holster from ENT since his diagnosis. The patient reports he has a squamous cell skin cancer to his nose which will be removed in March. He is accompanied by his wife today.  The patient had a PET scan performed on 03/11/16 and this revealed complete response to treatment in the head and neck region. However there is a new hypermetabolic lesion in the caudate region of the liver which is highly suspicious for liver metastasis. Liver protocol abdomen MRI with and without contrast is strongly recommended. There is patchy airspace disease in both lung apices, likely due to radiation, and this is not a clinical concern at this time.  ALLERGIES:  is  allergic to no known allergies.  Meds: Current Outpatient Prescriptions  Medication Sig Dispense Refill  . aspirin EC 81 MG tablet Take 81 mg by mouth daily.    . clopidogrel (PLAVIX) 75 MG tablet TAKE 1 TABLET EVERY DAY 90 tablet 1  . fluticasone (FLONASE) 50 MCG/ACT nasal spray Place 2 sprays into both nostrils daily.    . hydrochlorothiazide (HYDRODIURIL) 25 MG tablet Take 1 tablet (25 mg total) by mouth daily. 90 tablet 3  . HYDROcodone-acetaminophen (HYCET) 7.5-325 mg/15 ml solution Take 10-34m every 4 hours as needed for pain. 500 mL 0  . insulin aspart protamine- aspart (NOVOLOG MIX 70/30) (70-30) 100 UNIT/ML injection Inject 10-20 Units into the skin daily with supper. 1 unit before large meal. Per sliding scale    . insulin glargine (LANTUS) 100 UNIT/ML injection Inject 100 Units into the skin daily.     .Marland Kitchenlidocaine (XYLOCAINE) 2 % solution Patient: Mix 1part 2% viscous lidocaine, 1part H20. Swish and swallow 112mof this mixture, 3049mbefore meals and at bedtime, up to QID 100 mL 5  . mirtazapine (REMERON) 15 MG tablet TAKE 1/2 TABLET AT BEDTIME FOR 7 DAYS THEN TAKE 1 TABLET AT BEDTIME 30 tablet 0  . Nutritional Supplements (FEEDING SUPPLEMENT, OSMOLITE 1.5 CAL,) LIQD Give 1 1/2 cans osmolite 1.5 via PEG QID with 60 cc free water before and after feedings.  In addition, flush tube with  240 cc free water TID between meals. 6 Bottle 0  . ondansetron (ZOFRAN) 8 MG tablet Take 1 tablet (8 mg total) by mouth every 8 (eight) hours as needed for nausea. 60 tablet 3  . pantoprazole (PROTONIX) 40 MG tablet Take 1 tablet (40 mg total) by mouth daily. 90 tablet 3  . polyethylene glycol (MIRALAX / GLYCOLAX) packet Take 17 g by mouth daily as needed.    . prochlorperazine (COMPAZINE) 10 MG tablet Take 1 tablet (10 mg total) by mouth every 6 (six) hours as needed. 60 tablet 3  . scopolamine (TRANSDERM-SCOP) 1 MG/3DAYS Place 1 patch (1.5 mg total) onto the skin every 3 (three) days. 10 patch 0  .  senna (SENOKOT) 8.6 MG tablet Take 1 tablet by mouth daily as needed for constipation.    . simvastatin (ZOCOR) 40 MG tablet     . sodium fluoride (FLUORISHIELD) 1.1 % GEL dental gel Instill one drop of gel per tooth space of fluoride tray. Place over teeth for 5 minutes. Remove. Spit out excess. Repeat nightly. 120 mL 11  . sucralfate (CARAFATE) 1 g tablet Dissolve 1 tablet in 10 mL H20 and swallow up to QID for sore throat. 48 tablet 5   No current facility-administered medications for this encounter.     Physical Findings: The patient is in no acute distress. Patient is alert and oriented. Wt Readings from Last 3 Encounters:  03/12/16 180 lb 3.2 oz (81.7 kg)  01/23/16 175 lb 14.4 oz (79.8 kg)  01/09/16 172 lb (78 kg)    height is _0  (1.727 m) and weight is 180 lb 3.2 oz (81.7 kg). His temperature is 98.8 F (37.1 C). His blood pressure is 130/82 and his pulse is 99. His oxygen saturation is 97%.  General: Alert and oriented, in no acute distress. HEENT:A scab is noted on the nose where a squamous cell skin cancer is going to be excised next month. Oropharynx and oral cavity are clear. White patchiness noted on tongue, but it does not look like thrush. Mucous membranes are dry. Neck: Mild lymphedema in the anterior neck. Skin over neck has healed well with no sign of cancer. No palpable masses in cervical or supraclavicular regions. Heart: Regular in rate and rhythm, no murmurs. Chest: Clear to auscultation bilaterally. Abdomen: Soft and non tender. The patient has a feeding tube in place. Extremities: No edema in the extremities. Psych: blunted affect, alert, oriented  Lab Findings: Lab Results  Component Value Date   WBC 9.3 11/13/2015   HGB 14.6 11/13/2015   HCT 44.5 11/13/2015   MCV 84.5 11/13/2015   PLT 155 11/13/2015    No results found for: TSH  Radiographic Findings: Nm Pet Image Restag (ps) Skull Base To Thigh  Result Date: 03/11/2016 CLINICAL DATA:  Subsequent  treatment strategy for base of tongue carcinoma. Status post radiation therapy. EXAM: NUCLEAR MEDICINE PET SKULL BASE TO THIGH TECHNIQUE: 8.5 mCi F-18 FDG was injected intravenously. Full-ring PET imaging was performed from the skull base to thigh after the radiotracer. CT data was obtained and used for attenuation correction and anatomic localization. FASTING BLOOD GLUCOSE:  Value: 70 mg/dl COMPARISON:  08/29/2015 FINDINGS: NECK No hypermetabolic lymph nodes in the neck. Previously seen right level 5A hypermetabolic lymphadenopathy has resolved since previous study. CHEST No hypermetabolic mediastinal or hilar nodes. No suspicious pulmonary nodules on the CT scan. The new patchy airspace disease is seen in both lung apices which shows hypermetabolic activity, and is likely due  to radiation pneumonitis, with other infectious or inflammatory etiologies considered less likely. Aortic and coronary artery atherosclerosis again noted. ABDOMEN/PELVIS A new hypermetabolic lesion is seen in the caudate lobe of the liver which measures approximately 2.8 x 1.8 cm on image 96/4. Although no other liver lesions are identified, this is suspicious for liver metastasis. No other hypermetabolic masses or lymphadenopathy identified. Percutaneous gastrostomy tube in appropriate position. Tiny calcified gallstones again noted, without evidence of cholecystitis. Fluid attenuation right renal cyst shows no FDG uptake. Diffuse colonic diverticulosis again demonstrated, without evidence of diverticulitis. 3 cm lipoma again seen within the cecum without FDG uptake. Aortic atherosclerosis. Stable mildly enlarged prostate. SKELETON No focal hypermetabolic activity to suggest skeletal metastasis. IMPRESSION: Resolution of hypermetabolic right cervical lymphadenopathy since prior study . No other hypermetabolic lymphadenopathy identified. New hypermetabolic lesion and caudate lobe of liver, highly suspicious for liver metastasis. Recommend  liver protocol abdomen MRI without and with contrast for further evaluation. New patchy airspace disease in both lung apices with FDG uptake. 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 Recommend continued attention on follow-up imaging. Electronically Signed   By: Earle Gell M.D.   On: 03/11/2016 09:58    Impression/Plan:    1) Head and Neck Cancer Status: healing from RT. Recent PET scan shows complete response to treatment in the head and neck region, with a new liver lesion which is highly suspicious for liver metastasis.  PET images reviewed with patient today  2) Nutritional Status: doing well PEG tube: still using  3) Risk Factors: The patient has been educated about risk factors including alcohol and tobacco abuse; they understand that avoidance of alcohol and tobacco is important to prevent recurrences as well as other cancers.  4) Swallowing: continue swallowing exercises.  5) Dental: Encouraged to continue regular followup with dentistry, and dental hygiene including fluoride rinses.   6) Thyroid function: No results found for: TSH I ordered a lab test for today, but I am not sure it was done.  I 'll asked scheduled to re-arrange later this month.  7) Other: The patient will be scheduled for MRI before he meets with Dr. Alvy Bimler on 03/22/16. We discussed that pending the results a biopsy of the liver may be ordered. If this is indeed a malignant lesion, the patient understands that surgery and radiation therapy would be nonstandard treatments. Instead, we discussed that it is likely that the patient would be offered systemic treatment with chemotherapy.  8) The patient will follow up with me as needed, pending MRI results. The patient was encouraged to call with any issues or questions before then. Patient added to tumor board for discussion.   _____________________________________   Eppie Gibson,  MD  This document serves as a record of services personally performed by Eppie Gibson, MD. It was created on her behalf by Maryla Morrow, a trained medical scribe. The creation of this record is based on the scribe's personal observations and the provider's statements to them. This document has been checked and approved by the attending provider.

## 2016-03-13 NOTE — Progress Notes (Signed)
Oncology Nurse Navigator Documentation  To provide support, encouragement and care continuity, met with Benjamin Barr during post-tmt follow-up with Dr. Isidore Moos.  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  His wife accompanied him. They voiced understanding of:  Dr. Pearlie Oyster discussion of results of 2/1 re-staging PET, concern for liver lesion, her recommendation for MRI to better evaluate.    Chemotherapy as potential treatment option.  They indicated post-tmt follow-up appt scheduled with Dr. Alvy Bimler 2/12.   He noted:  Using PEG for nutrition, 6 cans Osmolite 1.5 daily.  Oral intake limited d/t taste alteration.   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 In response to their inquiry, I assured them I will continue to provide support as their navigator. They understand to call me with needs/concerns.  [name], RN, BSN, [place] Oncology Nurse [place] at [place] [phone]

## 2016-03-15 ENCOUNTER — Ambulatory Visit: Payer: Medicare Other

## 2016-03-15 ENCOUNTER — Telehealth: Payer: Self-pay | Admitting: *Deleted

## 2016-03-15 NOTE — Telephone Encounter (Signed)
CALLED PATIENT TO INFORM OF MRI FOR 03/17/16 - ARRIVAL TIME - 4:45 PM , PT. TO BE NPO - 4 HRS. PRIOR TO TEST, TEST TO BE DONE @ [facility], SPOKE WITH PATIENT AND HE IS AWARE OF THIS TEST.

## 2016-03-16 ENCOUNTER — Other Ambulatory Visit: Payer: Self-pay

## 2016-03-16 ENCOUNTER — Ambulatory Visit
Admission: RE | Admit: 2016-03-16 | Discharge: 2016-03-16 | Disposition: A | Discharge status: Home / Self Care | Payer: Medicare Other | Source: Ambulatory Visit | Attending: Radiation Oncology | Admitting: Radiation Oncology

## 2016-03-16 DIAGNOSIS — C01 Malignant neoplasm of base of tongue: Secondary | ICD-10-CM

## 2016-03-16 LAB — BUN AND CREATININE (CC13)
BUN: 19.9 mg/dL (ref 7.0–26.0)
CREATININE: 1 mg/dL (ref 0.7–1.3)
EGFR: 71 mL/min/{1.73_m2} — ABNORMAL LOW (ref >90)

## 2016-03-17 ENCOUNTER — Ambulatory Visit (HOSPITAL_COMMUNITY)
Admission: RE | Admit: 2016-03-17 | Discharge: 2016-03-17 | Disposition: A | Discharge status: Home / Self Care | Payer: Medicare Other | Source: Ambulatory Visit | Attending: Radiation Oncology | Admitting: Radiation Oncology

## 2016-03-17 DIAGNOSIS — R160 Hepatomegaly, not elsewhere classified: Secondary | ICD-10-CM | POA: Diagnosis not present

## 2016-03-17 DIAGNOSIS — K8020 Calculus of gallbladder without cholecystitis without obstruction: Secondary | ICD-10-CM | POA: Diagnosis not present

## 2016-03-17 DIAGNOSIS — C01 Malignant neoplasm of base of tongue: Secondary | ICD-10-CM | POA: Insufficient documentation

## 2016-03-17 DIAGNOSIS — K769 Liver disease, unspecified: Secondary | ICD-10-CM | POA: Insufficient documentation

## 2016-03-17 LAB — POCT I-STAT CREATININE: CREATININE: 1 mg/dL (ref 0.61–1.24)

## 2016-03-17 MED ORDER — GADOBENATE DIMEGLUMINE 529 MG/ML IV SOLN
17.0000 mL | Freq: Once | INTRAVENOUS | Status: AC | PRN
  Administered 2016-03-17: 17 mL via INTRAVENOUS

## 2016-03-22 ENCOUNTER — Telehealth: Payer: Self-pay | Admitting: Hematology and Oncology

## 2016-03-22 ENCOUNTER — Ambulatory Visit: Payer: Medicare Other

## 2016-03-22 ENCOUNTER — Telehealth: Payer: Self-pay | Admitting: Cardiology

## 2016-03-22 ENCOUNTER — Ambulatory Visit (HOSPITAL_BASED_OUTPATIENT_CLINIC_OR_DEPARTMENT_OTHER): Payer: Medicare Other | Admitting: Hematology and Oncology

## 2016-03-22 ENCOUNTER — Other Ambulatory Visit (HOSPITAL_COMMUNITY)
Admission: RE | Admit: 2016-03-22 | Discharge: 2016-03-22 | Disposition: A | Discharge status: Home / Self Care | Payer: Medicare Other | Source: Ambulatory Visit | Attending: Hematology and Oncology | Admitting: Hematology and Oncology

## 2016-03-22 ENCOUNTER — Other Ambulatory Visit (HOSPITAL_BASED_OUTPATIENT_CLINIC_OR_DEPARTMENT_OTHER): Payer: Medicare Other

## 2016-03-22 ENCOUNTER — Encounter: Payer: Self-pay | Admitting: *Deleted

## 2016-03-22 VITALS — BP 153/75 | HR 95 | Temp 98.5°F | Resp 18 | Ht 68.0 in | Wt 181.4 lb

## 2016-03-22 DIAGNOSIS — Z7189 Other specified counseling: Secondary | ICD-10-CM

## 2016-03-22 DIAGNOSIS — E1140 Type 2 diabetes mellitus with diabetic neuropathy, unspecified: Secondary | ICD-10-CM | POA: Diagnosis not present

## 2016-03-22 DIAGNOSIS — R1310 Dysphagia, unspecified: Secondary | ICD-10-CM

## 2016-03-22 DIAGNOSIS — Z794 Long term (current) use of insulin: Secondary | ICD-10-CM

## 2016-03-22 DIAGNOSIS — C01 Malignant neoplasm of base of tongue: Secondary | ICD-10-CM

## 2016-03-22 DIAGNOSIS — E440 Moderate protein-calorie malnutrition: Secondary | ICD-10-CM

## 2016-03-22 DIAGNOSIS — C787 Secondary malignant neoplasm of liver and intrahepatic bile duct: Secondary | ICD-10-CM

## 2016-03-22 DIAGNOSIS — C029 Malignant neoplasm of tongue, unspecified: Secondary | ICD-10-CM

## 2016-03-22 DIAGNOSIS — C770 Secondary and unspecified malignant neoplasm of lymph nodes of head, face and neck: Secondary | ICD-10-CM

## 2016-03-22 LAB — COMPREHENSIVE METABOLIC PANEL
ALT: 19 U/L (ref 0–55)
ANION GAP: 9 meq/L (ref 3–11)
AST: 21 U/L (ref 5–34)
Albumin: 3.6 g/dL (ref 3.5–5.0)
Alkaline Phosphatase: 100 U/L (ref 40–150)
BILIRUBIN TOTAL: 0.59 mg/dL (ref 0.20–1.20)
BUN: 22.6 mg/dL (ref 7.0–26.0)
CALCIUM: 10.3 mg/dL (ref 8.4–10.4)
CHLORIDE: 101 meq/L (ref 98–109)
CO2: 30 meq/L — AB (ref 22–29)
CREATININE: 1 mg/dL (ref 0.7–1.3)
EGFR: 77 mL/min/{1.73_m2} — AB (ref >90)
Glucose: 208 mg/dl — ABNORMAL HIGH (ref 70–140)
Potassium: 4.3 mEq/L (ref 3.5–5.1)
Sodium: 140 mEq/L (ref 136–145)
TOTAL PROTEIN: 7.3 g/dL (ref 6.4–8.3)

## 2016-03-22 LAB — MAGNESIUM: Magnesium: 2 mg/dl (ref 1.5–2.5)

## 2016-03-22 LAB — PHOSPHORUS: Phosphorus: 2.8 mg/dL (ref 2.5–4.6)

## 2016-03-22 MED ORDER — LIDOCAINE-PRILOCAINE 2.5-2.5 % EX CREA: TOPICAL_CREAM | CUTANEOUS | 3 refills | Status: DC

## 2016-03-22 NOTE — Progress Notes (Signed)
START ON PATHWAY REGIMEN - Head and Neck  [id number]: Carboplatin AUC=5 D1 + 5-Fluorouracil 1,000 mg/m2/d CIV D1-4 + Cetuximab 250 mg/m2 D1, 8, 15 q21 Days For Maximum of 6 Cycles Followed by Maintenance Cetuximab 250 mg/m2 Weekly  5-Fluorouracil + Carboplatin + Cetuximab cycle q21 days:   A cycle is every 21 days:     Carboplatin (Paraplatin(R)) AUC=5 in a total of in 500 mL NS IV over 60 minutes day 1 only Dose Mod: None     5-Fluorouracil 1,000 mg/m2/day in _____mL NS by continuous IV infusion over 24 hours days 1, 2, 3 and 4 (a total of 96 hours of 5-fluorouracil) Dose Mod: None     Cetuximab (Erbitux(R)) 400 mg/m2 LOADING IV over 2 hours Dose Mod: None     Cetuximab (Erbitux(R)) 250 mg/m2 IV over 1 hour weekly (maximum rate 5 mL/min) weekly starting cycle 1, day 8 Dose Mod: None Additional Orders: Verify cetuximab insurance coverage. Treat for a maximum of 6 cycles. If stable disease continue on to cetuximab maintenance therapy.  * All AUC calculations intended to be used in [employer] formula  **Always confirm dose/schedule in your pharmacy ordering system**    Cetuximab Maintenance (4 weeks per order sheet):   Administer weekly:     Cetuximab (Erbitux(R)) 250 mg/m2 IV over 1 hour weekly (maximum rate 5 mL/min) Dose Mod: None Additional Orders: Verify cetuximab insurance coverage.  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 I would be surprised if this patient died in the next year  Intent of Therapy: Non-Curative / Palliative Intent, Discussed with Patient

## 2016-03-22 NOTE — Progress Notes (Signed)
Patient on plan of care prior to pathways. 

## 2016-03-22 NOTE — Telephone Encounter (Signed)
Will forward to Dr Jordan for review  

## 2016-03-22 NOTE — Telephone Encounter (Signed)
Appointments scheduled per 2/12 LOS. Patient given AVS report and calendars with future scheduled appointments. °

## 2016-03-22 NOTE — Telephone Encounter (Signed)
Patient is calling because he currently takes plavix medication and would like to know if it would be okay to stop plavix 5 days before port is installed for chemo. Please call, thanks.

## 2016-03-22 NOTE — Progress Notes (Signed)
Oncology Nurse Navigator Documentation  Met with Mr. Massing and his wife during appt with Dr. Alvy Bimler. They voiced understanding of her review of MRI, proposed chemotherapy. He understands her recommendation for PAC early next week.  He is to call his cardiologist to inform he needs to hold Plavix for procedure, will confirm with me. They understand to contact me with needs/concerns.  [name], RN, BSN, [place] Oncology Nurse [place] at [place] [phone]

## 2016-03-23 ENCOUNTER — Ambulatory Visit: Payer: Medicare Other | Attending: Radiation Oncology | Admitting: Physical Therapy

## 2016-03-23 ENCOUNTER — Encounter: Payer: Self-pay | Admitting: *Deleted

## 2016-03-23 ENCOUNTER — Telehealth: Payer: Self-pay | Admitting: *Deleted

## 2016-03-23 ENCOUNTER — Other Ambulatory Visit: Payer: Medicare Other

## 2016-03-23 ENCOUNTER — Other Ambulatory Visit: Payer: Self-pay | Admitting: Student

## 2016-03-23 DIAGNOSIS — R1310 Dysphagia, unspecified: Secondary | ICD-10-CM | POA: Insufficient documentation

## 2016-03-23 DIAGNOSIS — R29898 Other symptoms and signs involving the musculoskeletal system: Secondary | ICD-10-CM | POA: Insufficient documentation

## 2016-03-23 DIAGNOSIS — I890 Lymphedema, not elsewhere classified: Secondary | ICD-10-CM

## 2016-03-23 DIAGNOSIS — L599 Disorder of the skin and subcutaneous tissue related to radiation, unspecified: Secondary | ICD-10-CM | POA: Insufficient documentation

## 2016-03-23 DIAGNOSIS — Z7189 Other specified counseling: Secondary | ICD-10-CM | POA: Insufficient documentation

## 2016-03-23 NOTE — Telephone Encounter (Signed)
Yes he may stop Plavix for 5 days for procedure  [name] MD, [facility]

## 2016-03-23 NOTE — Assessment & Plan Note (Addendum)
He has isolated liver metastasis. Potentially, it could be resectable in the future if he has excellent response to chemotherapy. His liver function tests is preserved 

## 2016-03-23 NOTE — Therapy (Addendum)
[place], [place], [address] Phone: [phone]   Fax:  [phone]  Physical Therapy Treatment  Patient Details  Name: Benjamin Barr MRN: [id number] Date of Birth: 01-07-1946 Referring Provider: Dr. Heath Lark  Encounter Date: 03/23/2016      PT End of Session - 03/23/16 1451    Visit Number 2   Number of Visits 5   Date for PT Re-Evaluation 04/09/16   PT Start Time 1350   PT Stop Time 1430   PT Time Calculation (min) 40 min   Activity Tolerance Patient tolerated treatment well   Behavior During Therapy [facility] for tasks assessed/performed      Past Medical History:  Diagnosis Date  . Anemia   . CAD (coronary artery disease)    a. 2008 PCI: Taxus DES  to mRCA, prox & mid LCFX;  b. 10/2014 MV: extensive ischemia in the RCA territory and small area of anterior ischemia;  c. 11/2014 PCI: LM nl, LAD 30ost/prox, 80d, LCX 90ost (3.0x12 Promus Prem DES), OM2 30, RCA 62m(3.0x12 Promus Prem DES), 145mRPDA 90 (PTCA), EF 55-65%.  . Cancer ([place]   Head & neck ca  . Diverticulosis of colon   . Heart murmur   . History of colon polyps    2014--  hyperplastic, adenoma, and benign polyps  . History of radiation therapy 10/01/15- 11/20/15   Oropharynx/base of neck 70 Gy in 35 fractions  . Hyperlipidemia   . Hypertension   . Myocardial infarction   . Nonproliferative diabetic retinopathy ([facility]  . Obesity   . Peripheral neuropathy (H[facility]  . Pneumonia    hx  . Prostate cancer ([facility]  . RBBB (right bundle branch block)   . Skin cancer   . Type 2 diabetes mellitus (HCLoma [place]    Past Surgical History:  Procedure Laterality Date  . APPENDECTOMY    . CARDIAC CATHETERIZATION N/A 11/13/2014   Procedure: Left Heart Cath and Coronary Angiography;  Surgeon: [name] JoMartiniqueMD;  [facility];  Service: Cardiovascular;  Laterality: N/A;  . CARDIAC CATHETERIZATION  11/13/2014   Procedure: Coronary Stent  Intervention;  Surgeon: [name] JoMartiniqueMD;  Location: [facility];  Service: Cardiovascular;;  . CARDIOVASCULAR STRESS TEST  01-17-2012  dr joMartinique Normal perfusion study/ no ischemia or infarction/  normal LVF and wall motion, ef 54%  . CORONARY ANGIOPLASTY WITH STENT PLACEMENT  05/04/2006   dr joMartinique Severe 2-vessel obstructive CAD/  normal LVF/  PCI with DES to proxima and mid CFX and mRCA (total 3 DES)  . CRYOABLATION N/A 04/01/2014   Procedure: CRYO ABLATION PROSTATE;  Surgeon: SiAilene RudMD;  [facility];  Laterality: N/A;  . DIRECT LARYNGOSCOPY N/A 09/19/2015   Procedure: DIRECT LARYNGOSCOPY;  Surgeon: JeIzora GalaMD;  [facility]: [facility] Service: ENT;  Laterality: N/A;  . ESOPHAGOSCOPY N/A 09/19/2015   Procedure: ESOPHAGOSCOPY;  Surgeon: JeIzora GalaMD;  [place] [facility] Service: ENT;  Laterality: N/A;  . FLOOR OF MOUTH BIOPSY N/A 09/19/2015   Procedure: FLOOR OF MOUTH BIOPSY;  Surgeon: JeIzora GalaMD;  [facility];  Service: ENT;  Laterality: N/A;  . INSERTION OF SUPRAPUBIC CATHETER N/A 04/01/2014   Procedure: INSERTION OF SUPRAPUBIC CATHETER;  Surgeon: SiAilene RudMD;  [facility];  Laterality: N/A;  suprapubic   .  IR GENERIC HISTORICAL  11/13/2015   IR GASTROSTOMY TUBE MOD SED 11/13/2015 [name], DO [facility]-INTERV RAD  . MULTIPLE TOOTH EXTRACTIONS  09/17/2015  . PARTIAL COLECTOMY  80's    There were no vitals filed for this visit.      Subjective Assessment - 03/23/16 1441    Subjective Pt states he found out today that he has cancer in his liver and will have to have chemotherapy.  He will have his port put in on Friday .  He thinks that it may be too much to continue with physical therapy at this time.  He would like to know what  he can do at home, cancel other appointments and call us if he needs [place] later.  He used the chip pack but said it didn't help him  much.    Patient is accompained by: Family member  wife    Pertinent History Prostate cancer treated with cryotherapy about year ago; IDDM, polymyalgia rheumatica; CAD s/p angioplasty (2 stents placed October 2016).  Bloody nasal congestion treated with antibiotics; developed a cough and was diagnosed with pneumonia; then had mass noted at right neck which was biopsied and found to be squamous cell carcinoma with unknown primary.  h/o foot and leg swelling that he says has mostly resolved and occurred at the time of his polymyalgia rheumatica.  Had XRT for neck, 35 treatments completeed mid-October 2017.  Still has the feeding tube.  Not eating much because taste buds are off and doesn't have appetite.  Lost 25 lbs. early on and has gained back about 5 lbs. Neck fullness developed about two weeks ago.  This has been variable.   Patient Stated Goals get rid of the fullness at the front of his neck and look normal again   Currently in Pain? Yes  pt says he has some pain in his right arm, probably from the arthitis                          [facility] Adult PT Treatment/Exercise - 03/23/16 0001      Neck Exercises: Seated   Other Seated Exercise scapulare retraction, posterior shoulder rolls and neck rotation along with neck extension with chin protraction to stretch anterion neck.      Manual Therapy   Manual Therapy Manual Lymphatic Drainage (MLD)   Manual Lymphatic Drainage (MLD) performed and instructed wife who had return demonstration:  short neck, axillary nodes anterior neck out to sides, lateral and posterior neck, submental and  submandibular area  toward lateral neck focusing on area of fullness.                 PT Education - 03/23/16 1451    Education provided Yes   Education Details manual lymph draiange and neck/scapular exercises    Person(s) Educated Patient;Spouse   Methods Explanation;Demonstration;Handout   Comprehension Verbalized understanding;Returned  demonstration                Long Term Clinic Goals - 03/23/16 1456      CC Long Term Goal  #1   Title Patient and/or his wife will be knowledgeable about performing manual lymph drainage for the neck.   Status On-going     CC Long Term Goal  #2   Title Patient will be knowledgeable about manufactured compression garment options, where and how to obtain one.   Status On-going     CC Long Term Goal  #3  Title Pt. will have reviewed neck ROM and the benefit of working on his neck flexibility.   Status Achieved         Head and Neck Clinic Goals - 09/16/15 1029      Patient will be able to verbalize understanding of a home exercise program for cervical range of motion, posture, and walking.    Status Achieved     Patient will be able to verbalize understanding of proper sitting and standing posture.    Status Achieved     Patient will be able to verbalize understanding of lymphedema risk and availability of treatment for this condition.    Status Achieved           Plan - 03/23/16 1452    Clinical Impression Statement Patient does not see any benefit from intital use of chip pack.  Encouraged him to continue use, perhaps tightening up a bit to give more compression at submental area.  Wife demonstrated understanding of basic MLD to anterior neck. Pt feels very overwhelmed with new diagnosis of liver cancer and upcoming chemotherapy and doesn't not think he will have time to continue PT at this time.  Will leave episode open should he want to call back in a few weeks to check in on how he has been managing at home.    Rehab Potential Good   Clinical Impairments Affecting Rehab Potential mulitple medical conditions    PT Frequency 1x / week   PT Duration 3 weeks   PT Treatment/Interventions ADLs/Self Care Home Management;DME Instruction;Therapeutic exercise;Patient/family education;Manual techniques;Manual lymph drainage;Compression bandaging;Passive  range of motion;Taping   PT Next Visit Plan Reasses, review MLD and compression and  reinforce his neck ROM exercise   Consulted and Agree with Plan of Care Patient      Patient will benefit from skilled therapeutic intervention in order to improve the following deficits and impairments:  Increased edema, Decreased knowledge of precautions, Decreased knowledge of use of DME, Decreased range of motion  Visit Diagnosis: Lymphedema, not elsewhere classified  Disorder of the skin and subcutaneous tissue related to radiation, unspecified  Other symptoms and signs involving the musculoskeletal system     Problem List Patient Active Problem List   Diagnosis Date Noted  . Goals of care, counseling/discussion 03/23/2016  . 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([place]) 03/22/2016  . Acquired lymphedema 02/23/2016  . S/P gastrostomy ([place]) 11/17/2015  . Protein-calorie malnutrition, moderate ([place]) 11/13/2015  . Nausea without vomiting 10/28/2015  . Constipation, acute 10/28/2015  . 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 Weight loss 10/14/2015  . 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 Cancer of base of tongue ([place]) 09/22/2015  . History of prostate cancer 09/03/2015  . Tongue cancer ([place]) 09/03/2015  . Unstable angina ([place]) 11/14/2014  . Abnormal nuclear stress test 11/13/2014  . Angina pectoris ([place]) 11/13/2014  . PMR (polymyalgia rheumatica) (H[facility]) 11/02/2014  . Chest pain with moderate risk for cardiac etiology 10/31/2014  . Diabetes mellitus with diabetic neuropathy, with long-term current use of insulin ([place]) 12/28/2010  . CAD S/P percutaneous coronary angioplasty   . Hypertension   . Hyperlipidemia   . Obesity    Donato Heinz. Owens Shark PT  Norwood Levo 03/23/2016, 2:57 PM  [place], [place], [address] Phone: [phone]   Fax:  [phone]  Name: JAMONT MELLIN MRN: [id number] Date  of Birth: 1945/02/19  PHYSICAL THERAPY DISCHARGE SUMMARY  Visits from  Start of Care: 2  Current functional level related to goals / functional outcomes: unknown  Remaining deficits: unknown   Education / Equipment: As above  Plan: Patient agrees to discharge.  Patient goals were partially met. Patient is being discharged due to not returning since the last visit.  ?????    Maudry Diego, PT 09/14/17 12:07 PM

## 2016-03-23 NOTE — Assessment & Plan Note (Addendum)
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 We discussed importance of Advanced Directives and Living will. I will get assistance from our social worker to help him fill out some paperwork. We discussed CODE STATUS; the patient desires to remain in full code. We discussed response rates expected and estimated survival with or without chemotherapy.

## 2016-03-23 NOTE — Assessment & Plan Note (Addendum)
We reviewed the imaging studies extensively. Unfortunately, he developed evidence of metastatic cancer to his liver. We discussed the risk of biopsy. With isolated involvement of the right lobe of the liver, we could consider aggressive approach with chemotherapy followed by possible surgical resection if he has good response to treatment. I recommend chemotherapy education class and port placement before start of treatment. The decision was made based on publication at the [facility] It is a category 1 recommendation from NCCN. We discussed the role of treatment is of palliative intent [place] Med 2008; [phone]  The chemotherapy consists of   1. Carboplatin (at an area under the curve of 5 mg per milliliter per minute, as a 1-hour intravenous infusion on day 1) plus  2. Fluorouracil (at a dose of 1000 mg per square meter per day for 4 days) every 3 weeks for a maximum of 6 cycles  3. Cetuximab (at a dose of 400 mg per square meter initially, as a 2-hour intravenous infusion, then 250 mg per square meter, as a 1-hour intravenous infusion per week) for a maximum of 6 cycles.   Patients with stable disease who received chemotherapy plus cetuximab continued to receive cetuximab until disease progression or unacceptable toxic effects, whichever occurred first.  Adding cetuximab to platinum-based chemotherapy with fluorouracil (platinum-fluorouracil) significantly prolonged the median overall survival from 7.4 months in the chemotherapy-alone group to 10.1 months in the group that received chemotherapy plus cetuximab (hazard ratio for death, 0.80; 95% confidence interval, 0.64 to 0.99; P=0.04).   The addition of cetuximab prolonged the median progression-free survival time from 3.3 to 5.6 months (hazard ratio for progression, 0.54; P<0.001) and increased the response rate from 20% to 36% (P<0.001).   There were no cetuximab-related deaths.  We discussed some of the risks,  benefits, side-effects of carboplatin, 5FU and Cetuximab  Some of the short term side-effects included, though not limited to, including weight loss, life threatening infections, risk of allergic reactions, need for transfusions of blood products, nausea, vomiting, mouth sores, change in bowel habits, loss of hair, admission to hospital for various reasons, and risks of death.   Long term side-effects are also discussed including risks of infertility, permanent damage to nerve function, hearing loss, chronic fatigue, kidney damage with possibility needing hemodialysis, and rare secondary malignancy including bone marrow disorders.  The patient is aware that the response rates discussed earlier is not guaranteed.  After a long discussion, patient made an informed decision to proceed with the prescribed plan of care.   Patient education material was dispensed. We also discussed possibility of referring him to tertiary center for second opinion and but he declines We discussed importance of port placement; he has to hold Plavix for 5 days prior to port placement He will receive Neulasta injection on March 5th; we plan to get treatment started on 2/26.  He will attend chemo education class I will see him prior to day 8 of therapy

## 2016-03-23 NOTE — Telephone Encounter (Signed)
Oncology Nurse Navigator Documentation  Received VMM from patient wife stating:  He received OK to hold Plavix from cardiologist, he is doing so pending scheduling of PAC placement.  He needs refill for Mirtazapine, originally Rxed by Survivorship NP Mike Craze. Dr. Alvy Bimler notified.  [name], RN, BSN, [place] [facility] Oncology Nurse [place] [facility] [place] [phone]

## 2016-03-23 NOTE — Assessment & Plan Note (Signed)
The patient is using at least 4 cans of nutritional supplement per day. His blood sugar is consistently over 200. I will defer to his primary care doctor for medical management

## 2016-03-24 ENCOUNTER — Other Ambulatory Visit: Payer: Self-pay | Admitting: Radiology

## 2016-03-24 DIAGNOSIS — R1310 Dysphagia, unspecified: Secondary | ICD-10-CM | POA: Insufficient documentation

## 2016-03-24 NOTE — Progress Notes (Signed)
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 Biopsy recommended. No definite pharyngeal mass lesion identified by CT.       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 No other primary or metastatic cancer. 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      03/11/2016 PET scan    Resolution of hypermetabolic right cervical lymphadenopathy since prior study . No other hypermetabolic lymphadenopathy identified. New hypermetabolic lesion and caudate lobe of liver, highly suspicious for liver metastasis. Recommend liver protocol abdomen MRI without and with contrast for further evaluation. New patchy airspace disease in both lung apices with FDG uptake. 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 Recommend continued attention on follow-up imaging.       03/19/2016 Imaging    MRI showed 3.4 cm heterogeneously enhancing mass in caudate lobe of liver, consistent liver metastasis. No other sites of metastatic disease identified within the liver or abdomen       Cancer of base of tongue ([place])    INTERVAL HISTORY: Please see below for problem oriented charting. He is here accompanied by his wife He has persistent dysphagia and is not eating much by mouth. He is feeding tube dependent He denies recent infection He denies recent abdominal pain or discomfort  REVIEW OF SYSTEMS:   Constitutional: Denies fevers, chills or abnormal weight loss Eyes: Denies blurriness of vision Ears, nose, mouth, throat, and face: Denies mucositis or sore throat Respiratory: Denies cough, dyspnea or wheezes Cardiovascular: Denies palpitation, chest discomfort or lower extremity swelling Gastrointestinal:  Denies nausea, heartburn or change in bowel habits Skin: Denies abnormal skin rashes Lymphatics: Denies new lymphadenopathy or easy bruising Neurological:Denies numbness, tingling or new weaknesses Behavioral/Psych: Mood is stable, no new changes  All other systems were reviewed with the patient and are negative.  I have reviewed the past medical history, past surgical history, social history and family history with the patient and they are unchanged from previous note.  ALLERGIES:  is allergic to no known allergies.  MEDICATIONS:  Current Outpatient Prescriptions  Medication Sig Dispense Refill  . aspirin EC 81 MG tablet Take 81 mg  by mouth daily.    . clopidogrel (PLAVIX) 75 MG tablet TAKE 1 TABLET EVERY DAY 90 tablet 1  . fluticasone (FLONASE) 50 MCG/ACT nasal spray Place 2 sprays into both nostrils daily.    . hydrochlorothiazide (HYDRODIURIL) 25 MG tablet Take 1 tablet  (25 mg total) by mouth daily. 90 tablet 3  . HYDROcodone-acetaminophen (HYCET) 7.5-325 mg/15 ml solution Take 10-53mL every 4 hours as needed for pain. 500 mL 0  . insulin aspart protamine- aspart (NOVOLOG MIX 70/30) (70-30) 100 UNIT/ML injection Inject 10-20 Units into the skin daily with supper. 1 unit before large meal. Per sliding scale    . insulin glargine (LANTUS) 100 UNIT/ML injection Inject 100 Units into the skin daily.     [name] lidocaine (XYLOCAINE) 2 % solution Patient: Mix 1part 2% viscous lidocaine, 1part H20. Swish and swallow 65mL of this mixture, 5min before meals and at bedtime, up to QID 100 mL 5  . lidocaine-prilocaine (EMLA) cream Apply to affected area once 30 g 3  . mirtazapine (REMERON) 15 MG tablet TAKE 1/2 TABLET AT BEDTIME FOR 7 DAYS THEN TAKE 1 TABLET AT BEDTIME 30 tablet 0  . Nutritional Supplements (FEEDING SUPPLEMENT, OSMOLITE 1.5 CAL,) LIQD Give 1 1/2 cans osmolite 1.5 via PEG QID with 60 cc free water before and after feedings.  In addition, flush tube with 240 cc free water TID between meals. 6 Bottle 0  . ondansetron (ZOFRAN) 8 MG tablet Take 1 tablet (8 mg total) by mouth every 8 (eight) hours as needed for nausea. 60 tablet 3  . pantoprazole (PROTONIX) 40 MG tablet Take 1 tablet (40 mg total) by mouth daily. 90 tablet 3  . polyethylene glycol (MIRALAX / GLYCOLAX) packet Take 17 g by mouth daily as needed.    . prochlorperazine (COMPAZINE) 10 MG tablet Take 1 tablet (10 mg total) by mouth every 6 (six) hours as needed. 60 tablet 3  . scopolamine (TRANSDERM-SCOP) 1 MG/3DAYS Place 1 patch (1.5 mg total) onto the skin every 3 (three) days. 10 patch 0  . senna (SENOKOT) 8.6 MG tablet Take 1 tablet by mouth daily as needed for constipation.    . simvastatin (ZOCOR) 40 MG tablet     . sodium fluoride (FLUORISHIELD) 1.1 % GEL dental gel Instill one drop of gel per tooth space of fluoride tray. Place over teeth for 5 minutes. Remove. Spit out excess. Repeat nightly. 120 mL 11   . sucralfate (CARAFATE) 1 g tablet Dissolve 1 tablet in 10 mL H20 and swallow up to QID for sore throat. 48 tablet 5   No current facility-administered medications for this visit.     PHYSICAL EXAMINATION: ECOG PERFORMANCE STATUS: 1 - Symptomatic but completely ambulatory  Vitals:   03/22/16 1428  BP: (!) 153/75  Pulse: 95  Resp: 18  Temp: 98.5 F (36.9 C)   Filed Weights   03/22/16 1428  Weight: 181 lb 6.4 oz (82.3 kg)    GENERAL:alert, no distress and comfortable SKIN: skin color, texture, turgor are normal, no rashes or significant lesions EYES: normal, Conjunctiva are pink and non-injected, sclera clear Musculoskeletal:no cyanosis of digits and no clubbing  NEURO: alert & oriented x 3 with fluent speech, no focal motor/sensory deficits  LABORATORY DATA:  I have reviewed the data as listed    Component Value Date/Time   NA 140 03/22/2016 1408   K 4.3 03/22/2016 1408   CL 102 09/15/2015 1345   CO2 30 (H) 03/22/2016 1408   GLUCOSE 208 (  H) 03/22/2016 1408   BUN 22.6 03/22/2016 1408   CREATININE 1.0 03/22/2016 1408   CALCIUM 10.3 03/22/2016 1408   PROT 7.3 03/22/2016 1408   ALBUMIN 3.6 03/22/2016 1408   AST 21 03/22/2016 1408   ALT 19 03/22/2016 1408   ALKPHOS 100 03/22/2016 1408   BILITOT 0.59 03/22/2016 1408   GFRNONAA >60 09/15/2015 1345   GFRAA >60 09/15/2015 1345    No results found for: SPEP, UPEP  Lab Results  Component Value Date   WBC 9.3 11/13/2015   NEUTROABS 8.4 (H) 11/13/2015   HGB 14.6 11/13/2015   HCT 44.5 11/13/2015   MCV 84.5 11/13/2015   PLT 155 11/13/2015      Chemistry      Component Value Date/Time   NA 140 03/22/2016 1408   K 4.3 03/22/2016 1408   CL 102 09/15/2015 1345   CO2 30 (H) 03/22/2016 1408   BUN 22.6 03/22/2016 1408   CREATININE 1.0 03/22/2016 1408   GLU 78 11/13/2015 1134      Component Value Date/Time   CALCIUM 10.3 03/22/2016 1408   ALKPHOS 100 03/22/2016 1408   AST 21 03/22/2016 1408   ALT 19 03/22/2016  1408   BILITOT 0.59 03/22/2016 1408       RADIOGRAPHIC STUDIES:I reviewed PET scan and MRi with the patient and wife I have personally reviewed the radiological images as listed and agreed with the findings in the report. Mr Liver W Wo Contrast  Result Date: 03/18/2016 CLINICAL DATA:  Base of tongue carcinoma. Hypermetabolic liver lesion seen on recent PET scan. EXAM: MRI ABDOMEN WITHOUT AND WITH CONTRAST TECHNIQUE: Multiplanar multisequence MR imaging of the abdomen was performed both before and after the administration of intravenous contrast. CONTRAST:  79mL MULTIHANCE GADOBENATE DIMEGLUMINE 529 MG/ML IV SOLN COMPARISON:  PET-CT on 03/11/2016 FINDINGS: Lower chest: No acute findings. Hepatobiliary: A heterogeneously enhancing hypovascular mass is seen in the caudate lobe of liver which measures 3.4 x 2.9 cm on image 21/3. This is consistent with liver metastasis. No other liver masses are identified. Tiny gallstones are noted, however there is no evidence of cholecystitis or biliary ductal dilatation. Pancreas:  No mass or inflammatory changes. Spleen:  Within normal limits in size and appearance. Adrenals/Urinary Tract: Several renal cysts are seen bilaterally. No masses identified. No evidence of hydronephrosis. Stomach/Bowel: Colonic diverticulosis is seen, however there is no evidence of diverticulitis involving the visualized portion of the colon. Percutaneous gastrostomy tube seen in appropriate position. Vascular/Lymphatic: No pathologically enlarged lymph nodes identified. No abdominal aortic aneurysm. Other:  None. Musculoskeletal:  No suspicious bone lesions identified. IMPRESSION: 3.4 cm heterogeneously enhancing mass in caudate lobe of liver, consistent liver metastasis. No other sites of metastatic disease identified within the liver or abdomen. Cholelithiasis.  No radiographic evidence of cholecystitis. Electronically Signed   By: Earle Gell M.D.   On: 03/18/2016 08:21   Nm Pet Image  Restag (ps) Skull Base To Thigh  Result Date: 03/11/2016 CLINICAL DATA:  Subsequent treatment strategy for base of tongue carcinoma. Status post radiation therapy. EXAM: NUCLEAR MEDICINE PET SKULL BASE TO THIGH TECHNIQUE: 8.5 mCi F-18 FDG was injected intravenously. Full-ring PET imaging was performed from the skull base to thigh after the radiotracer. CT data was obtained and used for attenuation correction and anatomic localization. FASTING BLOOD GLUCOSE:  Value: 70 mg/dl COMPARISON:  08/29/2015 FINDINGS: NECK No hypermetabolic lymph nodes in the neck. Previously seen right level 5A hypermetabolic lymphadenopathy has resolved since previous study. CHEST No hypermetabolic  mediastinal or hilar nodes. No suspicious pulmonary nodules on the CT scan. The new patchy airspace disease is seen in both lung apices which shows hypermetabolic activity, and is likely due to radiation pneumonitis, with other infectious or inflammatory etiologies considered less likely. Aortic and coronary artery atherosclerosis again noted. ABDOMEN/PELVIS A new hypermetabolic lesion is seen in the caudate lobe of the liver which measures approximately 2.8 x 1.8 cm on image 96/4. Although no other liver lesions are identified, this is suspicious for liver metastasis. No other hypermetabolic masses or lymphadenopathy identified. Percutaneous gastrostomy tube in appropriate position. Tiny calcified gallstones again noted, without evidence of cholecystitis. Fluid attenuation right renal cyst shows no FDG uptake. Diffuse colonic diverticulosis again demonstrated, without evidence of diverticulitis. 3 cm lipoma again seen within the cecum without FDG uptake. Aortic atherosclerosis. Stable mildly enlarged prostate. SKELETON No focal hypermetabolic activity to suggest skeletal metastasis. IMPRESSION: Resolution of hypermetabolic right cervical lymphadenopathy since prior study . No other hypermetabolic lymphadenopathy identified. New hypermetabolic  lesion and caudate lobe of liver, highly suspicious for liver metastasis. Recommend liver protocol abdomen MRI without and with contrast for further evaluation. New patchy airspace disease in both lung apices with FDG uptake. 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 Recommend continued attention on follow-up imaging. Electronically Signed   By: Earle Gell M.D.   On: 03/11/2016 09:58    ASSESSMENT & PLAN:  Tongue cancer ([place]) We reviewed the imaging studies extensively. Unfortunately, he developed evidence of metastatic cancer to his liver. We discussed the risk of biopsy. With isolated involvement of the right lobe of the liver, we could consider aggressive approach with chemotherapy followed by possible surgical resection if he has good response to treatment. I recommend chemotherapy education class and port placement before start of treatment. The decision was made based on publication at the [facility]. It is a category 1 recommendation from NCCN. We discussed the role of treatment is of palliative intent [place] Med 2008; [phone]  The chemotherapy consists of   1. Carboplatin (at an area under the curve of 5 mg per milliliter per minute, as a 1-hour intravenous infusion on day 1) plus  2. Fluorouracil (at a dose of 1000 mg per square meter per day for 4 days) every 3 weeks for a maximum of 6 cycles  3. Cetuximab (at a dose of 400 mg per square meter initially, as a 2-hour intravenous infusion, then 250 mg per square meter, as a 1-hour intravenous infusion per week) for a maximum of 6 cycles.   Patients with stable disease who received chemotherapy plus cetuximab continued to receive cetuximab until disease progression or unacceptable toxic effects, whichever occurred first.  Adding cetuximab to platinum-based chemotherapy with fluorouracil (platinum-fluorouracil) significantly prolonged  the median overall survival from 7.4 months in the chemotherapy-alone group to 10.1 months in the group that received chemotherapy plus cetuximab (hazard ratio for death, 0.80; 95% confidence interval, 0.64 to 0.99; P=0.04).   The addition of cetuximab prolonged the median progression-free survival time from 3.3 to 5.6 months (hazard ratio for progression, 0.54; P<0.001) and increased the response rate from 20% to 36% (P<0.001).   There were no cetuximab-related deaths.  We discussed some of the risks, benefits, side-effects of carboplatin, 5FU and Cetuximab  Some of the short term side-effects included, though not limited to, including weight loss, life threatening infections, risk of allergic reactions, need for transfusions of blood products, nausea,  vomiting, mouth sores, change in bowel habits, loss of hair, admission to hospital for various reasons, and risks of death.   Long term side-effects are also discussed including risks of infertility, permanent damage to nerve function, hearing loss, chronic fatigue, kidney damage with possibility needing hemodialysis, and rare secondary malignancy including bone marrow disorders.  The patient is aware that the response rates discussed earlier is not guaranteed.  After a long discussion, patient made an informed decision to proceed with the prescribed plan of care.   Patient education material was dispensed. We also discussed possibility of referring him to tertiary center for second opinion and but he declines We discussed importance of port placement; he has to hold Plavix for 5 days prior to port placement He will receive Neulasta injection on March 5th; we plan to get treatment started on 2/26.  He will attend chemo education class I will see him prior to day 8 of therapy  Diabetes mellitus with diabetic neuropathy, with long-term current use of insulin ([place]) The patient is using at least 4 cans of nutritional supplement per day. His blood  sugar is consistently over 200. 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 Potentially, it could be resectable in the future if he has excellent response to chemotherapy. His liver function tests is preserved  Goals of care, counseling/discussion The patient is aware he has stage IV disease and treatment is strictly palliative. We discussed importance of Advanced Directives and Living will. I will get assistance from our social worker to help him fill out some paperwork. We discussed CODE STATUS; the patient desires to remain in full code. We discussed response rates expected and estimated survival with or without chemotherapy.  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 IR FLUORO GUIDE PORT INSERTION RIGHT    Standing Status:   Future    Standing Expiration Date:   05/21/2017    Order Specific Question:   Reason for Exam (SYMPTOM  OR DIAGNOSIS REQUIRED)    Answer:   need port for chemo; please remind him when to stop Plavix    Order Specific Question:   Preferred Imaging Location?    Answer:   [facility]  . Magnesium    Standing Status:   Standing    Number of Occurrences:   20    Standing Expiration Date:   03/23/2017  . Comprehensive metabolic panel    Standing Status:   Standing    Number of Occurrences:   22    Standing Expiration Date:   03/23/2017  . CBC with Differential    Standing Status:   Standing    Number of Occurrences:   20    Standing Expiration Date:   03/24/2017   All questions were answered. The patient knows to call the clinic with any problems, questions or concerns. No barriers to learning was detected. I spent 60 minutes counseling the patient face to face. The total time spent in the appointment was 80 minutes and more than 50% was on counseling and  review of test results     Heath Lark, MD 03/24/2016 4:30 AM

## 2016-03-24 NOTE — Assessment & Plan Note (Signed)
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

## 2016-03-24 NOTE — Telephone Encounter (Signed)
Returned call to patient spoke to patient's wife Dr.Jordan's recommendations given.

## 2016-03-25 ENCOUNTER — Encounter: Payer: Self-pay | Admitting: Hematology and Oncology

## 2016-03-25 ENCOUNTER — Other Ambulatory Visit: Payer: Self-pay | Admitting: General Surgery

## 2016-03-25 ENCOUNTER — Telehealth: Payer: Self-pay | Admitting: *Deleted

## 2016-03-25 ENCOUNTER — Other Ambulatory Visit: Payer: Self-pay | Admitting: Radiology

## 2016-03-25 ENCOUNTER — Other Ambulatory Visit: Payer: Self-pay | Admitting: Student

## 2016-03-25 NOTE — Progress Notes (Signed)
Obtained clinical answers and signature from physician for Lidocaine PA. Faxed to [phone]. Fax received ok per confirmation sheet.

## 2016-03-25 NOTE — Telephone Encounter (Signed)
Per 2/12 LOS and staff messae I have scheduled appts. I have notified the scheduler

## 2016-03-25 NOTE — Progress Notes (Signed)
Received approval from [place] for Lidocaine/Prilocaine Cream.  Approval 12/26/15-06/23/16 unless otherwise notified as long as necessary conditions apply.  Called [facility])@[phone] to advise of approval. He states they received it.

## 2016-03-25 NOTE — Progress Notes (Signed)
Received PA request from [employer] for Lidocaine-Prilocaine cream.  Called [facility](Zelida)@[phone] to obtain PA. She states 2 forms had to be complete with clinical questions and exceeding quantity limit information. Requested forms be faxed to have RN to complete and physician to sign and will fax back for determination.

## 2016-03-26 ENCOUNTER — Ambulatory Visit (HOSPITAL_COMMUNITY)
Admission: RE | Admit: 2016-03-26 | Discharge: 2016-03-26 | Disposition: A | Discharge status: Home / Self Care | Payer: Medicare Other | Source: Ambulatory Visit | Attending: Hematology and Oncology | Admitting: Hematology and Oncology

## 2016-03-26 ENCOUNTER — Encounter (HOSPITAL_COMMUNITY): Payer: Self-pay

## 2016-03-26 ENCOUNTER — Other Ambulatory Visit: Payer: Self-pay | Admitting: Hematology and Oncology

## 2016-03-26 DIAGNOSIS — Z794 Long term (current) use of insulin: Secondary | ICD-10-CM | POA: Diagnosis not present

## 2016-03-26 DIAGNOSIS — E785 Hyperlipidemia, unspecified: Secondary | ICD-10-CM | POA: Diagnosis not present

## 2016-03-26 DIAGNOSIS — I4510 Unspecified right bundle-branch block: Secondary | ICD-10-CM | POA: Diagnosis not present

## 2016-03-26 DIAGNOSIS — D630 Anemia in neoplastic disease: Secondary | ICD-10-CM | POA: Insufficient documentation

## 2016-03-26 DIAGNOSIS — C029 Malignant neoplasm of tongue, unspecified: Secondary | ICD-10-CM

## 2016-03-26 DIAGNOSIS — Z7951 Long term (current) use of inhaled steroids: Secondary | ICD-10-CM | POA: Insufficient documentation

## 2016-03-26 DIAGNOSIS — Z931 Gastrostomy status: Secondary | ICD-10-CM | POA: Insufficient documentation

## 2016-03-26 DIAGNOSIS — E669 Obesity, unspecified: Secondary | ICD-10-CM | POA: Insufficient documentation

## 2016-03-26 DIAGNOSIS — Z6827 Body mass index (BMI) 27.0-27.9, adult: Secondary | ICD-10-CM | POA: Diagnosis not present

## 2016-03-26 DIAGNOSIS — Z7902 Long term (current) use of antithrombotics/antiplatelets: Secondary | ICD-10-CM | POA: Diagnosis not present

## 2016-03-26 DIAGNOSIS — I2510 Atherosclerotic heart disease of native coronary artery without angina pectoris: Secondary | ICD-10-CM | POA: Diagnosis not present

## 2016-03-26 DIAGNOSIS — E1142 Type 2 diabetes mellitus with diabetic polyneuropathy: Secondary | ICD-10-CM | POA: Insufficient documentation

## 2016-03-26 DIAGNOSIS — I10 Essential (primary) hypertension: Secondary | ICD-10-CM | POA: Diagnosis not present

## 2016-03-26 DIAGNOSIS — E113299 Type 2 diabetes mellitus with mild nonproliferative diabetic retinopathy without macular edema, unspecified eye: Secondary | ICD-10-CM | POA: Insufficient documentation

## 2016-03-26 DIAGNOSIS — Z955 Presence of coronary angioplasty implant and graft: Secondary | ICD-10-CM | POA: Insufficient documentation

## 2016-03-26 DIAGNOSIS — Z9049 Acquired absence of other specified parts of digestive tract: Secondary | ICD-10-CM | POA: Diagnosis not present

## 2016-03-26 DIAGNOSIS — Z7982 Long term (current) use of aspirin: Secondary | ICD-10-CM | POA: Insufficient documentation

## 2016-03-26 HISTORY — PX: IR GENERIC HISTORICAL: IMG1180011

## 2016-03-26 LAB — CBC
HCT: 45.9 % (ref 39.0–52.0)
Hemoglobin: 15.8 g/dL (ref 13.0–17.0)
MCH: 28.8 pg (ref 26.0–34.0)
MCHC: 34.4 g/dL (ref 30.0–36.0)
MCV: 83.8 fL (ref 78.0–100.0)
Platelets: 201 10*3/uL (ref 150–400)
RBC: 5.48 MIL/uL (ref 4.22–5.81)
RDW: 13.1 % (ref 11.5–15.5)
WBC: 8.8 10*3/uL (ref 4.0–10.5)

## 2016-03-26 LAB — PROTIME-INR
INR: 0.98
PROTHROMBIN TIME: 12.9 s (ref 11.4–15.2)

## 2016-03-26 LAB — GLUCOSE, CAPILLARY
GLUCOSE-CAPILLARY: 134 mg/dL — AB (ref 65–99)
Glucose-Capillary: 71 mg/dL (ref 65–99)

## 2016-03-26 LAB — APTT: aPTT: 27 seconds (ref 24–36)

## 2016-03-26 MED ORDER — FENTANYL CITRATE (PF) 100 MCG/2ML IJ SOLN
INTRAMUSCULAR | Status: AC
  Filled 2016-03-26: qty 4

## 2016-03-26 MED ORDER — HEPARIN SOD (PORK) LOCK FLUSH 100 UNIT/ML IV SOLN
INTRAVENOUS | Status: AC
  Filled 2016-03-26: qty 5

## 2016-03-26 MED ORDER — FENTANYL CITRATE (PF) 100 MCG/2ML IJ SOLN
INTRAMUSCULAR | Status: AC | PRN
  Administered 2016-03-26: 50 ug via INTRAVENOUS

## 2016-03-26 MED ORDER — SODIUM CHLORIDE 0.9 % IV SOLN
INTRAVENOUS | Status: DC
Start: 2016-03-26 — End: 2016-03-27
  Administered 2016-03-26: 13:00:00 via INTRAVENOUS

## 2016-03-26 MED ORDER — CEFAZOLIN SODIUM-DEXTROSE 2-4 GM/100ML-% IV SOLN
2.0000 g | INTRAVENOUS | Status: AC
  Administered 2016-03-26: 2 g via INTRAVENOUS
  Filled 2016-03-26: qty 100

## 2016-03-26 MED ORDER — MIDAZOLAM HCL 2 MG/2ML IJ SOLN
INTRAMUSCULAR | Status: AC | PRN
  Administered 2016-03-26 (×3): 1 mg via INTRAVENOUS

## 2016-03-26 MED ORDER — MIDAZOLAM HCL 2 MG/2ML IJ SOLN
INTRAMUSCULAR | Status: AC
  Filled 2016-03-26: qty 6

## 2016-03-26 MED ORDER — LIDOCAINE-EPINEPHRINE (PF) 2 %-1:200000 IJ SOLN
INTRAMUSCULAR | Status: AC
  Filled 2016-03-26: qty 20

## 2016-03-26 MED ORDER — HEPARIN SOD (PORK) LOCK FLUSH 100 UNIT/ML IV SOLN
INTRAVENOUS | Status: AC | PRN
  Administered 2016-03-26: 500 [IU]

## 2016-03-26 MED ORDER — LIDOCAINE HCL 1 % IJ SOLN
INTRAMUSCULAR | Status: AC
  Filled 2016-03-26: qty 20

## 2016-03-26 NOTE — Discharge Instructions (Signed)
Implanted Port Home Guide °An implanted port is a type of central line that is placed under the skin. Central lines are used to provide IV access when treatment or nutrition needs to be given through a person's veins. Implanted ports are used for long-term IV access. An implanted port may be placed because:  °· You need IV medicine that would be irritating to the small veins in your hands or arms.   °· You need long-term IV medicines, such as antibiotics.   °· You need IV nutrition for a long period.   °· You need frequent blood draws for lab tests.   °· You need dialysis.   °Implanted ports are usually placed in the chest area, but they can also be placed in the upper arm, the abdomen, or the leg. An implanted port has two main parts:  °· Reservoir. The reservoir is round and will appear as a small, raised area under your skin. The reservoir is the part where a needle is inserted to give medicines or draw blood.   °· Catheter. The catheter is a thin, flexible tube that extends from the reservoir. The catheter is placed into a large vein. Medicine that is inserted into the reservoir goes into the catheter and then into the vein.   °HOW WILL I CARE FOR MY INCISION SITE? °Do not get the incision site wet. Bathe or shower as directed by your health care provider.  °HOW IS MY PORT ACCESSED? °Special steps must be taken to access the port:  °· Before the port is accessed, a numbing cream can be placed on the skin. This helps numb the skin over the port site.   °· Your health care provider uses a sterile technique to access the port. °· Your health care provider must put on a mask and sterile gloves. °· The skin over your port is cleaned carefully with an antiseptic and allowed to dry. °· The port is gently pinched between sterile gloves, and a needle is inserted into the port. °· Only "non-coring" port needles should be used to access the port. Once the port is accessed, a blood return should be checked. This helps  ensure that the port is in the vein and is not clogged.   °· If your port needs to remain accessed for a constant infusion, a clear (transparent) bandage will be placed over the needle site. The bandage and needle will need to be changed every week, or as directed by your health care provider.   °· Keep the bandage covering the needle clean and dry. Do not get it wet. Follow your health care provider's instructions on how to take a shower or bath while the port is accessed.   °· If your port does not need to stay accessed, no bandage is needed over the port.   °WHAT IS FLUSHING? °Flushing helps keep the port from getting clogged. Follow your health care provider's instructions on how and when to flush the port. Ports are usually flushed with saline solution or a medicine called heparin. The need for flushing will depend on how the port is used.  °· If the port is used for intermittent medicines or blood draws, the port will need to be flushed:   °· After medicines have been given.   °· After blood has been drawn.   °· As part of routine maintenance.   °· If a constant infusion is running, the port may not need to be flushed.   °HOW LONG WILL MY PORT STAY IMPLANTED? °The port can stay in for as long as your health care   provider thinks it is needed. When it is time for the port to come out, surgery will be done to remove it. The procedure is similar to the one performed when the port was put in.  WHEN SHOULD I SEEK IMMEDIATE MEDICAL CARE? When you have an implanted port, you should seek immediate medical care if:   You notice a bad smell coming from the incision site.   You have swelling, redness, or drainage at the incision site.   You have more swelling or pain at the port site or the surrounding area.   You have a fever that is not controlled with medicine. This information is not intended to replace advice given to you by your health care provider. Make sure you discuss any questions you have with  your health care provider. Document Released: 01/25/2005 Document Revised: 11/15/2012 Document Reviewed: 10/02/2012 Elsevier Interactive Patient Education  2017 [place] Insertion, Care After Refer to this sheet in the next few weeks. These instructions provide you with information on caring for yourself after your procedure. Your health care provider may also give you more specific instructions. Your treatment has been planned according to current medical practices, but problems sometimes occur. Call your health care provider if you have any problems or questions after your procedure. WHAT TO EXPECT AFTER THE PROCEDURE After your procedure, it is typical to have the following:   Discomfort at the port insertion site. Ice packs to the area will help.  Bruising on the skin over the port. This will subside in 3-4 days. HOME CARE INSTRUCTIONS  After your port is placed, you will get a manufacturer's information card. The card has information about your port. Keep this card with you at all times.   Know what kind of port you have. There are many types of ports available.   Wear a medical alert bracelet in case of an emergency. This can help alert health care workers that you have a port.   The port can stay in for as long as your health care provider believes it is necessary.   A home health care nurse may give medicines and take care of the port.   You or a family member can get special training and directions for giving medicine and taking care of the port at home.  SEEK MEDICAL CARE IF:   Your port does not flush or you are unable to get a blood return.   You have a fever or chills. SEEK IMMEDIATE MEDICAL CARE IF:  You have new fluid or pus coming from your incision.   You notice a bad smell coming from your incision site.   You have swelling, pain, or more redness at the incision or port site.   You have chest pain or shortness of breath. This  information is not intended to replace advice given to you by your health care provider. Make sure you discuss any questions you have with your health care provider. Document Released: 11/15/2012 Document Revised: 01/30/2013 Document Reviewed: 11/15/2012 Elsevier Interactive Patient Education  2017 [place] dressing and shower in 24 to 48 hours.  Keep incision site clean and dry.  Moderate Conscious Sedation, Adult, Care After These instructions provide you with information about caring for yourself after your procedure. Your health care provider may also give you more specific instructions. Your treatment has been planned according to current medical practices, but problems sometimes occur. Call your health care provider if you have any problems or  questions after your procedure. What can I expect after the procedure? After your procedure, it is common:  To feel sleepy for several hours.  To feel clumsy and have poor balance for several hours.  To have poor judgment for several hours.  To vomit if you eat too soon. Follow these instructions at home: For at least 24 hours after the procedure:   Do not:  Participate in activities where you could fall or become injured.  Drive.  Use heavy machinery.  Drink alcohol.  Take sleeping pills or medicines that cause drowsiness.  Make important decisions or sign legal documents.  Take care of children on your own.  Rest. Eating and drinking  Follow the diet recommended by your health care provider.  If you vomit:  Drink water, juice, or soup when you can drink without vomiting.  Make sure you have little or no nausea before eating solid foods. General instructions  Have a responsible adult stay with you until you are awake and alert.  Take over-the-counter and prescription medicines only as told by your health care provider.  If you smoke, do not smoke without supervision.  Keep all follow-up visits as told  by your health care provider. This is important. Contact a health care provider if:  You keep feeling nauseous or you keep vomiting.  You feel light-headed.  You develop a rash.  You have a fever. Get help right away if:  You have trouble breathing. This information is not intended to replace advice given to you by your health care provider. Make sure you discuss any questions you have with your health care provider. Document Released: 11/15/2012 Document Revised: 06/30/2015 Document Reviewed: 05/17/2015 Elsevier Interactive Patient Education  2017 [employer].

## 2016-03-26 NOTE — Procedures (Signed)
Tongue ca  S/p RT IJ POWER PORT  Tip svcra No comp Stable Ready for use Full report in PACS

## 2016-03-26 NOTE — H&P (Signed)
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 He is familiar to the IR serice for G tube placement (11/13/15). He has undergone radiation therapy. He presents today for a placement of a right Port a Cath. PET scan on 03/11/16 demonstrated: New hypermetabolic lesion and caudate lobe of liver, highly suspicious for liver metastasis. He denies fever, chills, weight loss, h/a SOB, chest pain, N/V.   Past Medical History:  Diagnosis Date  . Anemia   . CAD (coronary artery disease)    a. 2008 PCI: Taxus DES  to mRCA, prox & mid LCFX;  b. 10/2014 MV: extensive ischemia in the RCA territory and small area of anterior ischemia;  c. 11/2014 PCI: LM nl, LAD 30ost/prox, 80d, LCX 90ost (3.0x12 Promus Prem DES), OM2 30, RCA 7m (3.0x12 Promus Prem DES), 99m, RPDA 90 (PTCA), EF 55-65%.  . Cancer ([place])    Head & neck ca  . Diverticulosis of colon   . Heart murmur   . History of colon polyps    2014--  hyperplastic, adenoma, and benign polyps  . History of radiation therapy 10/01/15- 11/20/15   Oropharynx/base of neck 70 Gy in 35 fractions  . Hyperlipidemia   . Hypertension   . Myocardial infarction   . 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 Obesity   . Peripheral neuropathy ([place])   . Pneumonia    hx  . Prostate cancer ([place])   . RBBB (right bundle branch block)   . Skin cancer   . Type 2 diabetes mellitus ([place])    Past Surgical History:  Procedure Laterality Date  . APPENDECTOMY    . CARDIAC CATHETERIZATION N/A 11/13/2014   Procedure: Left Heart Cath and Coronary Angiography;  Surgeon: [name] [name], MD;  Location: [place] [facility] [facility];  Service: Cardiovascular;  Laterality: N/A;  . CARDIAC CATHETERIZATION  11/13/2014   Procedure: Coronary Stent Intervention;  Surgeon: Peter  M [name], MD;  Location: [place] [facility];  Service: Cardiovascular;;  . CARDIOVASCULAR STRESS TEST  01-17-2012  dr [name]   Normal perfusion study/ no ischemia or infarction/  normal LVF and wall motion, ef 54%  . CORONARY ANGIOPLASTY WITH STENT PLACEMENT  05/04/2006   dr [name]   Severe 2-vessel obstructive CAD/  normal LVF/  PCI with DES to proxima and mid CFX and mRCA (total 3 DES)  . CRYOABLATION N/A 04/01/2014   Procedure: CRYO ABLATION PROSTATE;  Surgeon: [name], MD;  Location: [facility];  Service: Urology;  Laterality: N/A;  . DIRECT LARYNGOSCOPY N/A 09/19/2015   Procedure: DIRECT LARYNGOSCOPY;  Surgeon: Izora Gala, MD;  Location: [place];  Service: ENT;  Laterality: N/A;  . ESOPHAGOSCOPY N/A 09/19/2015   Procedure: ESOPHAGOSCOPY;  Surgeon: Izora Gala, MD;  Location: [place];  Service: ENT;  Laterality: N/A;  . FLOOR OF MOUTH BIOPSY N/A 09/19/2015   Procedure: FLOOR OF MOUTH BIOPSY;  Surgeon: Izora Gala, MD;  Location: [facility] OR;  Service: ENT;  Laterality: N/A;  . INSERTION OF SUPRAPUBIC CATHETER N/A 04/01/2014   Procedure: INSERTION OF SUPRAPUBIC CATHETER;  Surgeon: [name], MD;  Location: [facility];  Service: Urology;  Laterality: N/A;  suprapubic   . IR GENERIC HISTORICAL  11/13/2015   IR GASTROSTOMY TUBE MOD SED  11/13/2015 [name], DO [facility]-INTERV RAD  . MULTIPLE TOOTH EXTRACTIONS  09/17/2015  . PARTIAL COLECTOMY  80's     Allergies: No known allergies  Medications: Prior to Admission medications   Medication Sig Start Date End Date Taking? Authorizing Provider  ondansetron (ZOFRAN) 8 MG tablet Take 1 tablet (8 mg total) by mouth every 8 (eight) hours as needed for nausea. 01/09/16  Yes Holley Bouche, NP  pantoprazole (PROTONIX) 40 MG tablet Take 1 tablet (40 mg total) by mouth daily. 01/09/16  Yes Holley Bouche, NP  prochlorperazine (COMPAZINE) 10 MG tablet Take 1 tablet (10 mg total) by mouth every 6 (six) hours  as needed. 01/09/16  Yes Holley Bouche, NP  silver sulfADIAZINE (SILVADENE) 1 % cream Apply 1 application topically daily. For sacral ulceration area   Yes Historical Provider, MD  simvastatin (ZOCOR) 40 MG tablet  01/24/16  Yes Historical Provider, MD  sodium fluoride (FLUORISHIELD) 1.1 % GEL dental gel Instill one drop of gel per tooth space of fluoride tray. Place over teeth for 5 minutes. Remove. Spit out excess. Repeat nightly. 09/22/15  Yes Lenn Cal, DDS  aspirin EC 81 MG tablet Take 81 mg by mouth daily.    Historical Provider, MD  clopidogrel (PLAVIX) 75 MG tablet TAKE 1 TABLET EVERY DAY 11/27/15   [name] [name], MD  fluticasone [facility]) 50 MCG/ACT nasal spray Place 2 sprays into both nostrils daily. 08/20/15   Historical Provider, MD  hydrochlorothiazide (HYDRODIURIL) 25 MG tablet Take 1 tablet (25 mg total) by mouth daily. 12/29/15   [name] [name], MD  HYDROcodone-acetaminophen (HYCET) 7.5-325 mg/15 ml solution Take 10-48mL every 4 hours as needed for pain. 12/10/15   Eppie Gibson, MD  insulin aspart protamine- aspart (NOVOLOG MIX 70/30) (70-30) 100 UNIT/ML injection Inject 10-20 Units into the skin daily with supper. 1 unit before large meal. Per sliding scale    Historical Provider, MD  insulin glargine (LANTUS) 100 UNIT/ML injection Inject 100 Units into the skin daily.     Historical Provider, MD  lidocaine (XYLOCAINE) 2 % solution Patient: Mix 1part 2% viscous lidocaine, 1part H20. Swish and swallow 64mL of this mixture, 49min before meals and at bedtime, up to QID 10/06/15   Eppie Gibson, MD  lidocaine-prilocaine (EMLA) cream Apply to affected area once 03/22/16   Heath Lark, MD  mirtazapine (REMERON) 15 MG tablet TAKE 1/2 TABLET AT BEDTIME FOR 7 DAYS THEN TAKE 1 TABLET AT BEDTIME 02/19/16   Holley Bouche, NP  Nutritional Supplements (FEEDING SUPPLEMENT, OSMOLITE 1.5 CAL,) LIQD Give 1 1/2 cans osmolite 1.5 via PEG QID with 60 cc free water before and after feedings.  In  addition, flush tube with 240 cc free water TID between meals. 11/19/15   Eppie Gibson, MD  polyethylene glycol [facility] / Floria Raveling) packet Take 17 g by mouth daily as needed.    Historical Provider, MD  scopolamine (TRANSDERM-SCOP) 1 MG/3DAYS Place 1 patch (1.5 mg total) onto the skin every 3 (three) days. 11/11/15   Heath Lark, MD  senna (SENOKOT) 8.6 MG tablet Take 1 tablet by mouth daily as needed for constipation.    Historical Provider, MD  sucralfate (CARAFATE) 1 g tablet Dissolve 1 tablet in 10 mL H20 and swallow up to QID for sore throat. 10/06/15   Eppie Gibson, MD     Vital Signs: Blood pressure 135/71, pulse 86, temperature 98.9 F (37.2 C), temperature source Oral, resp. rate 18, height 5\' 8"  (1.727 m), weight  181 lb 6.4 oz (82.3 kg), SpO2 97 %.    Physical Exam  Constitutional: He appears well-developed and well-nourished. No distress.  HENT:  Head: Normocephalic and atraumatic.  Oropharynx was dry with white patches on anterior surface of tongue.  Eyes: EOM are normal.  Cardiovascular: Normal rate, regular rhythm and normal heart sounds.  Exam reveals no gallop and no friction rub.   No murmur heard. Pulmonary/Chest: Effort normal and breath sounds normal. No respiratory distress. He has no wheezes. He has no rales.  Abdominal: Soft. Bowel sounds are normal.  Musculoskeletal: He exhibits no edema.  Neurological: He is alert.  Psychiatric: He has a normal mood and affect.    Imaging: No results found.  Labs:  CBC:  Recent Labs  09/15/15 1345 11/13/15 0922 03/26/16 1238  WBC 11.1* 9.3 8.8  HGB 15.8 14.6 15.8  HCT 46.5 44.5 45.9  PLT 162 155 201    COAGS:  Recent Labs  11/13/15 1230 03/26/16 1238  INR 1.14 0.98  APTT 30 27    BMP:  Recent Labs  09/15/15 1345  11/17/15 1154 11/20/15 1113 01/12/16 1151 03/16/16 1212 03/17/16 1737 03/22/16 1408  NA 137  < > 140 139 137  --   --  140  K 3.7  < > 3.5 3.4* 3.8  --   --  4.3  CL 102  --   --    --   --   --   --   --   CO2 25  < > 29 29 29   --   --  30*  GLUCOSE 230*  < > 231* 250* 203*  --   --  208*  BUN 14  < > 12.1 10.6 22.3 19.9  --  22.6  CALCIUM 10.1  < > 9.6 9.8 10.7*  --   --  10.3  CREATININE 0.88  < > 1.0 1.0 1.2 1.0 1.00 1.0  GFRNONAA >60  --   --   --   --   --   --   --   GFRAA >60  --   --   --   --   --   --   --   < > = values in this interval not displayed.  LIVER FUNCTION TESTS:  Recent Labs  11/17/15 1154 11/20/15 1113 01/12/16 1151 03/22/16 1408  BILITOT 0.67 0.65 0.72 0.59  AST 13 13 20 21   ALT 9 13 22 19   ALKPHOS 82 84 116 100  PROT 6.7 7.1 8.5* 7.3  ALBUMIN 3.0* 3.3* 3.7 3.6    Assessment and Plan:  71 y/o male with relevant past medical history of head and neck/tongue cancer. He is familiar to the IR serice for G tube placement (11/13/15). He presents today for a placement of a right Port a Cath. PET scan on 03/11/16 demonstrated: New hypermetabolic lesion and caudate lobe of liver, highly suspicious for liver metastasis. He denies fever, chills, weight loss, h/a SOB, chest pain, N/V. Dr. Annamaria Boots has reviewed the case and has agreed to place a right Port a Cath this afternoon. Risks and benefits discussed with the patient/wife including, but not limited to infection, bleeding, significant bleeding causing loss or decrease in renal function or damage to adjacent structures.  All of the patient's questions were answered, patient is agreeable to proceed. Consent signed and in chart.      Electronically Signed: D. Alden Server, PA-S 03/26/2016, 1:34 PM   I spent a  total of 20 at the the patient's bedside AND on the patient's hospital floor or unit, greater than 50% of which was counseling/coordinating care for placement of right Port a Cath.

## 2016-03-29 ENCOUNTER — Telehealth: Payer: Self-pay | Admitting: Cardiology

## 2016-03-29 ENCOUNTER — Ambulatory Visit: Payer: Medicare Other

## 2016-03-29 ENCOUNTER — Ambulatory Visit: Payer: Medicare Other | Admitting: Nutrition

## 2016-03-29 DIAGNOSIS — I890 Lymphedema, not elsewhere classified: Secondary | ICD-10-CM | POA: Diagnosis not present

## 2016-03-29 DIAGNOSIS — R1310 Dysphagia, unspecified: Secondary | ICD-10-CM

## 2016-03-29 NOTE — Therapy (Signed)
[facility] [address] [place], [place], [address] Phone: [phone]   Fax:  [phone]  Speech Language Pathology Treatment  Patient Details  Name: Benjamin Barr MRN: [id number] Date of Birth: 03-19-1945 Referring Provider: Eppie Gibson MD  Encounter Date: 03/29/2016      End of Session - 03/29/16 1348    Visit Number 6   Number of Visits 8   Date for SLP Re-Evaluation 04/27/16   SLP Start Time 1320   SLP Stop Time  1351   SLP Time Calculation (min) 31 min   Activity Tolerance Patient tolerated treatment well      Past Medical History:  Diagnosis Date  . Anemia   . CAD (coronary artery disease)    a. 2008 PCI: Taxus DES  to mRCA, prox & mid LCFX;  b. 10/2014 MV: extensive ischemia in the RCA territory and small area of anterior ischemia;  c. 11/2014 PCI: LM nl, LAD 30ost/prox, 80d, LCX 90ost (3.0x12 Promus Prem DES), OM2 30, RCA 46m(3.0x12 Promus Prem DES), 119mRPDA 90 (PTCA), EF 55-65%.  . Cancer ([facility]   Head & neck ca  . Diverticulosis of colon   . Heart murmur   . History of colon polyps    2014--  hyperplastic, adenoma, and benign polyps  . History of radiation therapy 10/01/15- 11/20/15   Oropharynx/base of neck 70 Gy in 35 fractions  . Hyperlipidemia   . Hypertension   . Myocardial infarction   . Nonproliferative diabetic retinopathy ([facility]  . Obesity   . Peripheral neuropathy ([facility]  . Pneumonia    hx  . Prostate cancer ([facility]  . RBBB (right bundle branch block)   . Skin cancer   . Type 2 diabetes mellitus ([facility]    Past Surgical History:  Procedure Laterality Date  . APPENDECTOMY    . CARDIAC CATHETERIZATION N/A 11/13/2014   Procedure: Left Heart Cath and Coronary Angiography;  Surgeon: [name] JoMartiniqueMD;  [facility];  Service: Cardiovascular;  Laterality: N/A;  . CARDIAC CATHETERIZATION  11/13/2014   Procedure: Coronary Stent Intervention;  Surgeon: [name] JoMartinique MD;  [facility];  Service: Cardiovascular;;  . CARDIOVASCULAR STRESS TEST  01-17-2012  dr joMartinique Normal perfusion study/ no ischemia or infarction/  normal LVF and wall motion, ef 54%  . CORONARY ANGIOPLASTY WITH STENT PLACEMENT  05/04/2006   dr joMartinique Severe 2-vessel obstructive CAD/  normal LVF/  PCI with DES to proxima and mid CFX and mRCA (total 3 DES)  . CRYOABLATION N/A 04/01/2014   Procedure: CRYO ABLATION PROSTATE;  Surgeon: SiAilene RudMD;  [facility] Service: [facility]rology;  Laterality: N/A;  . DIRECT LARYNGOSCOPY N/A 09/19/2015   Procedure: DIRECT LARYNGOSCOPY;  Surgeon: JeIzora GalaMD;  [facility] Service: ENT;  Laterality: N/A;  . ESOPHAGOSCOPY N/A 09/19/2015   Procedure: ESOPHAGOSCOPY;  Surgeon: JeIzora GalaMD;  [facility] Service: ENT;  Laterality: N/A;  . FLOOR OF MOUTH BIOPSY N/A 09/19/2015   Procedure: FLOOR OF MOUTH BIOPSY;  Surgeon: JeIzora GalaMD;  [facility];  Service: ENT;  Laterality: N/A;  . INSERTION OF SUPRAPUBIC CATHETER N/A 04/01/2014   Procedure: INSERTION OF SUPRAPUBIC CATHETER;  Surgeon: SiAilene RudMD;  [facility];  Laterality: N/A;  suprapubic   . IR GENERIC HISTORICAL  11/13/2015  IR GASTROSTOMY TUBE MOD SED 11/13/2015 [name], DO [facility]  . IR GENERIC HISTORICAL  03/26/2016   IR US GUIDE VASC ACCESS RIGHT 03/26/2016 [name], MD [facility]  . IR GENERIC HISTORICAL  03/26/2016   IR FLUORO GUIDE PORT INSERTION RIGHT 03/26/2016 [name], MD [facility]  . MULTIPLE TOOTH EXTRACTIONS  09/17/2015  . PARTIAL COLECTOMY  80's    There were no vitals filed for this visit.      Subjective Assessment - 03/29/16 1325    Subjective Starts chemo Friday, found cancer deep in liver.               ADULT SLP TREATMENT - 03/29/16 1324      General Information   Behavior/Cognition Alert;Cooperative     Treatment  Provided   Treatment provided Dysphagia     Dysphagia Treatment   Temperature Spikes Noted No   Respiratory Status Room air   Oral Cavity - Dentition Missing dentition   Treatment Methods Therapeutic exercise;Patient/caregiver education;Skilled observation   Patient observed directly with PO's Yes   Type of PO's observed Thin liquids   Liquids provided via Cup   Oral Phase Signs & Symptoms --  none noted   Pharyngeal Phase Signs & Symptoms --  none noted   Other treatment/comments Pt found out he has liver cancer - naturally more subdued today than previous sessions. Has not tried much PO since last session, nor has tried HEP much since last session.  Pt politely declinced applesauce, but took some water PO, wihtout overt s/s aspiration. Pt reports less xerostomia last 3 nights. SLP reviewed exercises with pt, with rare min A (chin pushback/modified Shaker).      Assessment / Recommendations / Plan   Plan Discharge SLP treatment due to (comment)  pt request     Progression Toward Goals   Progression toward goals --  d/c today - see goal update          SLP Education - 03/29/16 1348    Education provided Yes   Education Details s/s aspiration PNA   Person(s) Educated Patient;Spouse   Methods Explanation;Demonstration;Handout;Verbal cues   Comprehension Verbalized understanding;Returned demonstration          SLP Short Term Goals - 02/23/16 1642      SLP SHORT TERM GOAL #1   Title pt will perform HEP with rare min A    Period --  visits   Status Not Met     SLP SHORT TERM GOAL #2   Title pt will tell SLP why he is completing HEP over two sessions   Baseline 11-24-15   Period --  visits   Status Partially Met          SLP Long Term Goals - 03/29/16 1403      SLP LONG TERM GOAL #1   Title pt will complete HEP with rare min A   Status Achieved  not met- renewed 02-23-16     SLP LONG TERM GOAL #2   Title pt will tell SLP 3 s/s aspiration PNA with modified  independence   Status Achieved     SLP LONG TERM GOAL #3   Title pt will tell benefits of a food journal in returning to full PO diet   Status Not Met  not met - renewed 02-23-16     SLP LONG TERM GOAL #4   Title pt will tell SLP why he is completing HEP   Status Not Met  Plan - Apr 05, 2016 1349    Clinical Impression Statement Pt with continued noncompliance with HEP as prescribed/recommended with diagnosis of inoperable liver cancer. SLP lightly stressed the need for following HEP to continue to see swallowing skills WNL/WFL. Pt without overt s/s aspiration today with H2O, pt politely declined water. Pt cont to receive primary nutrtition/hydration via PEG and has not attempted foods in last 4 weeks. Pt with proper completion of HEP except for modified Shaker, which SLP provided min cues. Pt would like to be d/c'd at this time as he "will be busy with chemo". If re-referral is needed please send additional script. Thank you.   Treatment/Interventions Aspiration precaution training;Pharyngeal strengthening exercises;Diet toleration management by SLP;Compensatory techniques;Trials of upgraded texture/liquids;Cueing hierarchy;Patient/family education;Oral motor exercises;SLP instruction and feedback   Potential to Achieve Goals Good   Potential Considerations Cooperation/participation level   Consulted and Agree with Plan of Care Patient      Patient will benefit from skilled therapeutic intervention in order to improve the following deficits and impairments:   Dysphagia, unspecified type      G-Codes - 04/05/16 1405    Functional Assessment Tool Used noms - 7   Functional Limitations Swallowing   Swallow Goal Status (B2841) At least 1 percent but less than 20 percent impaired, limited or restricted   Swallow Discharge Status [phone]) At least 40 percent but less than 60 percent impaired, limited or restricted      [place]  Visits from Start of Care:  6  Current functional level related to goals / functional outcomes: Pt states swallowing function appears better than a month ago. Pt recently found out he has stage IV liver cancer and will undergo palliative chemo beginning this week. He does not desire to cont skilled ST at this time and told SLP he will perform HEP more routinely than he has in the past.   Remaining deficits: Swallowing deficits - caused in part due to xerostomia and dysgeusia.   Education / Equipment: Late effects head/neck radiation on swallowing function, HEP, s/s aspiration PNA, lymphedema tx.  Plan: Patient agrees to discharge.  Patient goals were partially met. Patient is being discharged due to the patient's request.  ?????        Problem List Patient Active Problem List   Diagnosis Date Noted  . Dysphagia 03/24/2016  . Goals of care, counseling/discussion 03/23/2016  . 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([place]) 03/22/2016  . Acquired lymphedema 02/23/2016  . S/P gastrostomy ([place]) 11/17/2015  . Protein-calorie malnutrition, moderate ([place]) 11/13/2015  . Nausea without vomiting 10/28/2015  . Constipation, acute 10/28/2015  . 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 Weight loss 10/14/2015  . 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 Cancer of base of tongue ([place]) 09/22/2015  . History of prostate cancer 09/03/2015  . Tongue cancer ([place]) 09/03/2015  . Unstable angina ([place]) 11/14/2014  . Abnormal nuclear stress test 11/13/2014  . Angina pectoris ([place]) 11/13/2014  . PMR (polymyalgia rheumatica) ([facility]) 11/02/2014  . Chest pain with moderate risk for cardiac etiology 10/31/2014  . Diabetes mellitus with diabetic neuropathy, with long-term current use of insulin ([place]) 12/28/2010  . CAD S/P percutaneous coronary angioplasty   . Hypertension   . Hyperlipidemia   . Obesity     Benjamin Barr ,[place], CCC-SLP  Apr 05, 2016, 2:06 PM  [facility] [address] [place], [place], [address] Phone: [phone]   Fax:  [phone]   Name: Benjamin  JAQUAVIOUS Barr MRN: [id number] Date of Birth: 1945/04/14

## 2016-03-29 NOTE — Telephone Encounter (Signed)
°*  STAT* If patient is at the pharmacy, call can be transferred to refill team.   1. Which medications need to be refilled? (please list name of each medication and dose if known) Simvastatin, 40 mg   2. Which pharmacy/location (including street and city if local pharmacy) is medication to be sent to? [address], [place], [place] [address]  [phone]   3. Do they need a 30 day or 90 day supply? 90 day

## 2016-03-29 NOTE — Patient Instructions (Signed)
Signs of Aspiration Pneumonia   . Chest pain/tightness . Fever (can be low grade) . Cough  o With foul-smelling phlegm (sputum) o With sputum containing pus or blood o With greenish sputum . Fatigue  . Shortness of breath  . Wheezing   **IF YOU HAVE THESE SIGNS, CONTACT YOUR DOCTOR OR GO TO THE EMERGENCY DEPARTMENT OR URGENT CARE AS SOON AS POSSIBLE**      

## 2016-03-29 NOTE — Progress Notes (Signed)
Nutrition follow-up completed with patient and his wife will receive palliative chemotherapy for stage IV cancer secondary to evidence of liver metastases. Weight improved documented as 181.4 pounds on February 16 increased from 175 pounds on December 15. Patient is tolerating 6 cans of Osmolite 1.5 daily. He is drinking water by mouth but is not eating He has no nutrition impact symptoms.  Nutrition diagnosis: Inadequate oral intake continues.  Intervention: Educated patient to continue strategies for adequate tube feeding to meet greater than 90% of estimated nutrition needs. Goal is for weight maintenance.  Monitoring, evaluation, goals:  Patient will tolerate chemotherapy with minimal side effects and weight maintenance.  Next visit: To be scheduled with chemotherapy.  **Disclaimer: This note was dictated with voice recognition software. Similar sounding words can inadvertently be transcribed and this note may contain transcription errors which may not have been corrected upon publication of note.**

## 2016-03-30 ENCOUNTER — Telehealth: Payer: Self-pay | Admitting: *Deleted

## 2016-03-30 ENCOUNTER — Ambulatory Visit: Payer: Medicare Other | Admitting: Physical Therapy

## 2016-03-30 ENCOUNTER — Other Ambulatory Visit: Payer: Self-pay | Admitting: Hematology and Oncology

## 2016-03-30 DIAGNOSIS — C01 Malignant neoplasm of base of tongue: Secondary | ICD-10-CM

## 2016-03-30 DIAGNOSIS — F329 Major depressive disorder, single episode, unspecified: Secondary | ICD-10-CM

## 2016-03-30 DIAGNOSIS — F32A Depression, unspecified: Secondary | ICD-10-CM

## 2016-03-30 MED ORDER — MIRTAZAPINE 15 MG PO TABS: 15.0000 mg | ORAL_TABLET | Freq: Every day | ORAL | 11 refills | Status: DC

## 2016-03-30 NOTE — Telephone Encounter (Signed)
Per chart review-primary MD is managing cholesterol and medications.    Returned call and left message to make aware (ok per DPR).  Advised to call with further questions or concerns.

## 2016-03-30 NOTE — Telephone Encounter (Signed)
Wife returned call and states that at last OV Dr. [name] stated he would take over management of his cholesterol and medication for this.    New rx sent to pharmacy.  Wife verbalized understanding.

## 2016-04-05 ENCOUNTER — Other Ambulatory Visit: Payer: Self-pay

## 2016-04-05 ENCOUNTER — Ambulatory Visit: Payer: Medicare Other | Admitting: Nutrition

## 2016-04-05 ENCOUNTER — Ambulatory Visit (HOSPITAL_BASED_OUTPATIENT_CLINIC_OR_DEPARTMENT_OTHER): Payer: Medicare Other

## 2016-04-05 ENCOUNTER — Other Ambulatory Visit (HOSPITAL_BASED_OUTPATIENT_CLINIC_OR_DEPARTMENT_OTHER): Payer: Medicare Other

## 2016-04-05 ENCOUNTER — Encounter: Payer: Self-pay | Admitting: General Practice

## 2016-04-05 ENCOUNTER — Observation Stay (HOSPITAL_COMMUNITY)
Admission: EM | Admit: 2016-04-05 | Discharge: 2016-04-06 | Disposition: A | Discharge status: Home / Self Care | Payer: Medicare Other | Attending: Family Medicine | Admitting: Family Medicine

## 2016-04-05 ENCOUNTER — Other Ambulatory Visit (HOSPITAL_COMMUNITY)
Admission: AD | Admit: 2016-04-05 | Discharge: 2016-04-05 | Disposition: A | Discharge status: Home / Self Care | Payer: Medicare Other | Source: Ambulatory Visit | Attending: Hematology and Oncology | Admitting: Hematology and Oncology

## 2016-04-05 ENCOUNTER — Encounter (HOSPITAL_COMMUNITY): Payer: Self-pay

## 2016-04-05 ENCOUNTER — Other Ambulatory Visit: Payer: Self-pay | Admitting: Hematology and Oncology

## 2016-04-05 VITALS — BP 122/71 | HR 91 | Temp 98.9°F | Resp 16

## 2016-04-05 DIAGNOSIS — C770 Secondary and unspecified malignant neoplasm of lymph nodes of head, face and neck: Secondary | ICD-10-CM

## 2016-04-05 DIAGNOSIS — I4891 Unspecified atrial fibrillation: Secondary | ICD-10-CM | POA: Diagnosis not present

## 2016-04-05 DIAGNOSIS — Z931 Gastrostomy status: Secondary | ICD-10-CM | POA: Insufficient documentation

## 2016-04-05 DIAGNOSIS — I469 Cardiac arrest, cause unspecified: Secondary | ICD-10-CM | POA: Diagnosis not present

## 2016-04-05 DIAGNOSIS — C787 Secondary malignant neoplasm of liver and intrahepatic bile duct: Secondary | ICD-10-CM

## 2016-04-05 DIAGNOSIS — I252 Old myocardial infarction: Secondary | ICD-10-CM | POA: Insufficient documentation

## 2016-04-05 DIAGNOSIS — E1140 Type 2 diabetes mellitus with diabetic neuropathy, unspecified: Secondary | ICD-10-CM | POA: Diagnosis not present

## 2016-04-05 DIAGNOSIS — E1142 Type 2 diabetes mellitus with diabetic polyneuropathy: Secondary | ICD-10-CM | POA: Diagnosis not present

## 2016-04-05 DIAGNOSIS — Z955 Presence of coronary angioplasty implant and graft: Secondary | ICD-10-CM | POA: Diagnosis not present

## 2016-04-05 DIAGNOSIS — Z6827 Body mass index (BMI) 27.0-27.9, adult: Secondary | ICD-10-CM | POA: Diagnosis not present

## 2016-04-05 DIAGNOSIS — Z8546 Personal history of malignant neoplasm of prostate: Secondary | ICD-10-CM | POA: Insufficient documentation

## 2016-04-05 DIAGNOSIS — C01 Malignant neoplasm of base of tongue: Secondary | ICD-10-CM

## 2016-04-05 DIAGNOSIS — E440 Moderate protein-calorie malnutrition: Secondary | ICD-10-CM | POA: Diagnosis not present

## 2016-04-05 DIAGNOSIS — Z7902 Long term (current) use of antithrombotics/antiplatelets: Secondary | ICD-10-CM | POA: Diagnosis not present

## 2016-04-05 DIAGNOSIS — T886XXA Anaphylactic reaction due to adverse effect of correct drug or medicament properly administered, initial encounter: Secondary | ICD-10-CM | POA: Diagnosis not present

## 2016-04-05 DIAGNOSIS — M353 Polymyalgia rheumatica: Secondary | ICD-10-CM | POA: Diagnosis not present

## 2016-04-05 DIAGNOSIS — T782XXA Anaphylactic shock, unspecified, initial encounter: Secondary | ICD-10-CM | POA: Diagnosis present

## 2016-04-05 DIAGNOSIS — Z794 Long term (current) use of insulin: Secondary | ICD-10-CM | POA: Insufficient documentation

## 2016-04-05 DIAGNOSIS — R1310 Dysphagia, unspecified: Secondary | ICD-10-CM | POA: Diagnosis not present

## 2016-04-05 DIAGNOSIS — C760 Malignant neoplasm of head, face and neck: Secondary | ICD-10-CM | POA: Diagnosis not present

## 2016-04-05 DIAGNOSIS — I10 Essential (primary) hypertension: Secondary | ICD-10-CM | POA: Diagnosis present

## 2016-04-05 DIAGNOSIS — Z7982 Long term (current) use of aspirin: Secondary | ICD-10-CM | POA: Diagnosis not present

## 2016-04-05 DIAGNOSIS — Z923 Personal history of irradiation: Secondary | ICD-10-CM | POA: Insufficient documentation

## 2016-04-05 DIAGNOSIS — E113299 Type 2 diabetes mellitus with mild nonproliferative diabetic retinopathy without macular edema, unspecified eye: Secondary | ICD-10-CM | POA: Diagnosis not present

## 2016-04-05 DIAGNOSIS — T782XXD Anaphylactic shock, unspecified, subsequent encounter: Secondary | ICD-10-CM | POA: Diagnosis not present

## 2016-04-05 DIAGNOSIS — C029 Malignant neoplasm of tongue, unspecified: Secondary | ICD-10-CM | POA: Diagnosis present

## 2016-04-05 DIAGNOSIS — Z79899 Other long term (current) drug therapy: Secondary | ICD-10-CM | POA: Diagnosis not present

## 2016-04-05 DIAGNOSIS — Z5112 Encounter for antineoplastic immunotherapy: Secondary | ICD-10-CM | POA: Diagnosis present

## 2016-04-05 DIAGNOSIS — E669 Obesity, unspecified: Secondary | ICD-10-CM | POA: Diagnosis not present

## 2016-04-05 DIAGNOSIS — T451X5A Adverse effect of antineoplastic and immunosuppressive drugs, initial encounter: Secondary | ICD-10-CM | POA: Diagnosis not present

## 2016-04-05 DIAGNOSIS — R55 Syncope and collapse: Secondary | ICD-10-CM | POA: Diagnosis present

## 2016-04-05 DIAGNOSIS — Z9049 Acquired absence of other specified parts of digestive tract: Secondary | ICD-10-CM | POA: Insufficient documentation

## 2016-04-05 DIAGNOSIS — I2510 Atherosclerotic heart disease of native coronary artery without angina pectoris: Secondary | ICD-10-CM | POA: Insufficient documentation

## 2016-04-05 LAB — CBC
HEMATOCRIT: 44.6 % (ref 39.0–52.0)
HEMOGLOBIN: 15.1 g/dL (ref 13.0–17.0)
MCH: 28.3 pg (ref 26.0–34.0)
MCHC: 33.9 g/dL (ref 30.0–36.0)
MCV: 83.5 fL (ref 78.0–100.0)
Platelets: 180 10*3/uL (ref 150–400)
RBC: 5.34 MIL/uL (ref 4.22–5.81)
RDW: 13.2 % (ref 11.5–15.5)
WBC: 13.2 10*3/uL — ABNORMAL HIGH (ref 4.0–10.5)

## 2016-04-05 LAB — COMPREHENSIVE METABOLIC PANEL
ALBUMIN: 3.5 g/dL (ref 3.5–5.0)
ALK PHOS: 87 U/L (ref 40–150)
ALT: 14 U/L (ref 0–55)
AST: 16 U/L (ref 5–34)
Anion Gap: 10 mEq/L (ref 3–11)
BILIRUBIN TOTAL: 0.58 mg/dL (ref 0.20–1.20)
BUN: 17.9 mg/dL (ref 7.0–26.0)
CO2: 27 meq/L (ref 22–29)
Calcium: 10.1 mg/dL (ref 8.4–10.4)
Chloride: 103 mEq/L (ref 98–109)
Creatinine: 0.9 mg/dL (ref 0.7–1.3)
EGFR: 86 mL/min/{1.73_m2} — AB (ref >90)
GLUCOSE: 144 mg/dL — AB (ref 70–140)
Potassium: 3.8 mEq/L (ref 3.5–5.1)
SODIUM: 140 meq/L (ref 136–145)
TOTAL PROTEIN: 6.9 g/dL (ref 6.4–8.3)

## 2016-04-05 LAB — MRSA PCR SCREENING: MRSA by PCR: NEGATIVE

## 2016-04-05 LAB — CBC WITH DIFFERENTIAL/PLATELET
BASO%: 0.2 % (ref 0.0–2.0)
Basophils Absolute: 0 10*3/uL (ref 0.0–0.1)
EOS ABS: 0.1 10*3/uL (ref 0.0–0.5)
EOS%: 1.3 % (ref 0.0–7.0)
HCT: 45.4 % (ref 38.4–49.9)
HEMOGLOBIN: 15.4 g/dL (ref 13.0–17.1)
LYMPH%: 5.2 % — AB (ref 14.0–49.0)
MCH: 29.3 pg (ref 27.2–33.4)
MCHC: 33.9 g/dL (ref 32.0–36.0)
MCV: 86.3 fL (ref 79.3–98.0)
MONO#: 0.6 10*3/uL (ref 0.1–0.9)
MONO%: 7 % (ref 0.0–14.0)
NEUT%: 86.3 % — ABNORMAL HIGH (ref 39.0–75.0)
NEUTROS ABS: 7.4 10*3/uL — AB (ref 1.5–6.5)
Platelets: 180 10*3/uL (ref 140–400)
RBC: 5.26 10*6/uL (ref 4.20–5.82)
RDW: 13.2 % (ref 11.0–14.6)
WBC: 8.6 10*3/uL (ref 4.0–10.3)
lymph#: 0.5 10*3/uL — ABNORMAL LOW (ref 0.9–3.3)

## 2016-04-05 LAB — CREATININE, SERUM
Creatinine, Ser: 1.03 mg/dL (ref 0.61–1.24)
GFR calc Af Amer: >60 mL/min (ref >60)

## 2016-04-05 LAB — CBG MONITORING, ED
GLUCOSE-CAPILLARY: 64 mg/dL — AB (ref 65–99)
Glucose-Capillary: 171 mg/dL — ABNORMAL HIGH (ref 65–99)

## 2016-04-05 LAB — PHOSPHORUS: Phosphorus: 3.8 mg/dL (ref 2.5–4.6)

## 2016-04-05 LAB — TROPONIN I: Troponin I: <0.03 ng/mL (ref <0.03)

## 2016-04-05 LAB — MAGNESIUM: Magnesium: 1.8 mg/dl (ref 1.5–2.5)

## 2016-04-05 MED ORDER — POLYETHYLENE GLYCOL 3350 17 G PO PACK: 17.0000 g | PACK | Freq: Every day | ORAL | Status: DC | PRN

## 2016-04-05 MED ORDER — INSULIN GLARGINE 100 UNIT/ML ~~LOC~~ SOLN
100.0000 [IU] | Freq: Every day | SUBCUTANEOUS | Status: DC
  Administered 2016-04-05: 100 [IU] via SUBCUTANEOUS
  Filled 2016-04-05: qty 1

## 2016-04-05 MED ORDER — ACETAMINOPHEN 325 MG PO TABS: 650.0000 mg | ORAL_TABLET | Freq: Four times a day (QID) | ORAL | Status: DC | PRN

## 2016-04-05 MED ORDER — ENOXAPARIN SODIUM 40 MG/0.4ML ~~LOC~~ SOLN
40.0000 mg | SUBCUTANEOUS | Status: DC
  Administered 2016-04-05: 40 mg via SUBCUTANEOUS
  Filled 2016-04-05: qty 0.4

## 2016-04-05 MED ORDER — SODIUM CHLORIDE 0.9 % IV SOLN
513.5000 mg | Freq: Once | INTRAVENOUS | Status: DC
  Filled 2016-04-05: qty 51

## 2016-04-05 MED ORDER — CETUXIMAB CHEMO IV INJECTION 200 MG/100ML
400.0000 mg/m2 | Freq: Once | INTRAVENOUS | Status: DC
  Administered 2016-04-05: 800 mg via INTRAVENOUS
  Filled 2016-04-05: qty 400

## 2016-04-05 MED ORDER — ASPIRIN EC 81 MG PO TBEC
81.0000 mg | DELAYED_RELEASE_TABLET | Freq: Every day | ORAL | Status: DC
  Administered 2016-04-05: 81 mg via ORAL
  Filled 2016-04-05: qty 1

## 2016-04-05 MED ORDER — EPINEPHRINE 0.3 MG/0.3ML IJ SOAJ
0.3000 mg | Freq: Once | INTRAMUSCULAR | Status: AC
  Administered 2016-04-05: 0.3 mg via INTRAMUSCULAR
  Filled 2016-04-05: qty 0.3

## 2016-04-05 MED ORDER — INSULIN ASPART 100 UNIT/ML ~~LOC~~ SOLN
0.0000 [IU] | Freq: Three times a day (TID) | SUBCUTANEOUS | Status: DC
  Administered 2016-04-06: 3 [IU] via SUBCUTANEOUS

## 2016-04-05 MED ORDER — DIPHENHYDRAMINE HCL 50 MG/ML IJ SOLN: 12.5000 mg | Freq: Four times a day (QID) | INTRAMUSCULAR | Status: DC | PRN

## 2016-04-05 MED ORDER — DEXAMETHASONE SODIUM PHOSPHATE 10 MG/ML IJ SOLN
10.0000 mg | Freq: Once | INTRAMUSCULAR | Status: AC
  Administered 2016-04-05: 10 mg via INTRAVENOUS

## 2016-04-05 MED ORDER — MIRTAZAPINE 15 MG PO TABS
15.0000 mg | ORAL_TABLET | Freq: Every day | ORAL | Status: DC
  Administered 2016-04-05: 15 mg via ORAL
  Filled 2016-04-05: qty 1

## 2016-04-05 MED ORDER — METHYLPREDNISOLONE SODIUM SUCC 125 MG IJ SOLR
125.0000 mg | Freq: Once | INTRAMUSCULAR | Status: AC
  Administered 2016-04-05: 125 mg via INTRAVENOUS

## 2016-04-05 MED ORDER — PREDNISONE 20 MG PO TABS
50.0000 mg | ORAL_TABLET | Freq: Two times a day (BID) | ORAL | Status: DC
  Administered 2016-04-05: 20:00:00 50 mg via ORAL
  Filled 2016-04-05 (×2): qty 2

## 2016-04-05 MED ORDER — DILTIAZEM HCL-DEXTROSE 100-5 MG/100ML-% IV SOLN (PREMIX)
5.0000 mg/h | INTRAVENOUS | Status: DC
  Administered 2016-04-05: 5 mg/h via INTRAVENOUS
  Filled 2016-04-05: qty 100

## 2016-04-05 MED ORDER — CLOPIDOGREL BISULFATE 75 MG PO TABS
75.0000 mg | ORAL_TABLET | Freq: Every day | ORAL | Status: DC
  Administered 2016-04-05: 75 mg via ORAL
  Filled 2016-04-05: qty 1

## 2016-04-05 MED ORDER — PALONOSETRON HCL INJECTION 0.25 MG/5ML
0.2500 mg | Freq: Once | INTRAVENOUS | Status: AC
  Administered 2016-04-05: 0.25 mg via INTRAVENOUS

## 2016-04-05 MED ORDER — ONDANSETRON HCL 4 MG PO TABS: 8.0000 mg | ORAL_TABLET | Freq: Three times a day (TID) | ORAL | Status: DC | PRN

## 2016-04-05 MED ORDER — SODIUM CHLORIDE 0.9% FLUSH
3.0000 mL | Freq: Two times a day (BID) | INTRAVENOUS | Status: DC
  Administered 2016-04-05: 3 mL via INTRAVENOUS

## 2016-04-05 MED ORDER — OSMOLITE 1.5 CAL PO LIQD
237.0000 mL | Freq: Four times a day (QID) | ORAL | Status: DC
  Administered 2016-04-05 – 2016-04-06 (×3): 237 mL
  Filled 2016-04-05 (×4): qty 237

## 2016-04-05 MED ORDER — SODIUM CHLORIDE 0.9 % IV SOLN
Freq: Once | INTRAVENOUS | Status: AC
  Administered 2016-04-05: 09:00:00 via INTRAVENOUS

## 2016-04-05 MED ORDER — ACETAMINOPHEN 650 MG RE SUPP: 650.0000 mg | Freq: Four times a day (QID) | RECTAL | Status: DC | PRN

## 2016-04-05 MED ORDER — DIPHENHYDRAMINE HCL 50 MG/ML IJ SOLN
50.0000 mg | Freq: Once | INTRAMUSCULAR | Status: AC
  Administered 2016-04-05: 50 mg via INTRAVENOUS

## 2016-04-05 MED ORDER — FLUTICASONE PROPIONATE 50 MCG/ACT NA SUSP
2.0000 | Freq: Every evening | NASAL | Status: DC | PRN
  Filled 2016-04-05: qty 16

## 2016-04-05 MED ORDER — INSULIN ASPART 100 UNIT/ML ~~LOC~~ SOLN
0.0000 [IU] | Freq: Every day | SUBCUTANEOUS | Status: DC
  Administered 2016-04-05: 5 [IU] via SUBCUTANEOUS

## 2016-04-05 MED ORDER — DEXTROSE 5 % AND 0.9 % NACL IV BOLUS
500.0000 mL | Freq: Once | INTRAVENOUS | Status: AC
  Administered 2016-04-05: 500 mL via INTRAVENOUS

## 2016-04-05 MED ORDER — DILTIAZEM LOAD VIA INFUSION
15.0000 mg | Freq: Once | INTRAVENOUS | Status: AC
  Administered 2016-04-05: 15 mg via INTRAVENOUS
  Filled 2016-04-05: qty 15

## 2016-04-05 MED ORDER — SODIUM CHLORIDE 0.9 % IV SOLN
1005.0000 mg/m2/d | INTRAVENOUS | Status: DC
  Filled 2016-04-05: qty 160

## 2016-04-05 MED ORDER — DILTIAZEM HCL ER 60 MG PO CP12
60.0000 mg | ORAL_CAPSULE | Freq: Two times a day (BID) | ORAL | Status: DC
  Administered 2016-04-05: 60 mg via ORAL
  Filled 2016-04-05 (×3): qty 1

## 2016-04-05 NOTE — ED Triage Notes (Addendum)
Pt presents to ED after LOC and allergic reaction after beginning a new chemotherapy his morning.  Denies pain.  Pt, also, reports that he took a new insulin(Novolog) this morning.  Hx of head and neck CA.       Oncologist reports that LOC is typical reaction to the chemo Pt was being given.  Unsure if patient actually coded.  [facility] RN reported LOC and "seizure-like" activity.  Epi-pen and Solumedrol 125mg  given at [facility].

## 2016-04-05 NOTE — Progress Notes (Signed)
I was unable to follow up with patient in chemo today. Patient was transferred to ED. I will reschedule as needed.

## 2016-04-05 NOTE — H&P (Addendum)
History and Physical  [name] [id number] DOB: 1946/01/03 DOA: 04/05/2016  Referring physician: Thalia Party, ER physician  PCP: [name], MD  Outpatient Specialists: [facility],  Patient coming from: home  & is able to ambulate without assistance   Chief Complaint:  passed out   HPI: Benjamin Barr is a 71 y.o. 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 Reports are conflicting the patient either passed out as a straightforward reaction to the chemotherapy agent versus anaphylactic shock versus cardiac arrest. A code was done with reports of CPR being given. Patient was also given Solu-Medrol and epinephrine for presumed anaphylaxis. Patient was able to be resuscitated and woke up soon after. He was evaluated and found to be in new onset rapid atrial fibrillation. He was started on a Cardizem drip, but otherwise was more awake and alert. Hospitalists were called for further evaluation. Following patient awakening, he stated that broke out in a temporary rash on his hands and abdomen, but this has since gone away.  After arrival to stepdown unit, patient converted back to sinus rhythm   Review of Systems: Patient seen After arrival to stepdown  . Pt complains of Some soreness across his chest, especially where they did chest compressions .  he has chronic dysphagia although is able to take sips with his medications orally. He has a PEG tube receiving tube feeds.he has chronic loose stools from his tube feeds    Pt denies any Headache, vision changes, shortness of breath, wheeze, cough, abdominal pain, hematuria, dysuria, constipation, focal extremity numbness weakness or pain. Review of systems are otherwise negative   Past Medical History:    Diagnosis Date  . Anemia   . CAD (coronary artery disease)    a. 2008 PCI: Taxus DES  to mRCA, prox & mid LCFX;  b. 10/2014 MV: extensive ischemia in the RCA territory and small area of anterior ischemia;  c. 11/2014 PCI: LM nl, LAD 30ost/prox, 80d, LCX 90ost (3.0x12 Promus Prem DES), OM2 30, RCA 22m (3.0x12 Promus Prem DES), 4m, RPDA 90 (PTCA), EF 55-65%.  . Cancer ([place])    Head & neck ca  . Diverticulosis of colon   . Heart murmur   . History of colon polyps    2014--  hyperplastic, adenoma, and benign polyps  . History of radiation therapy 10/01/15- 11/20/15   Oropharynx/base of neck 70 Gy in 35 fractions  . Hyperlipidemia   . Hypertension   . Myocardial infarction   . Nonproliferative diabetic retinopathy ([place])   . Obesity   . Peripheral neuropathy ([place])   . Pneumonia    hx  . Prostate cancer ([place])   . RBBB (right bundle branch block)   . Skin cancer   . Type 2 diabetes mellitus ([place])    Past Surgical History:  Procedure Laterality Date  . APPENDECTOMY    . CARDIAC CATHETERIZATION N/A 11/13/2014   Procedure: Left Heart Cath and Coronary Angiography;  Surgeon: [name] [name], MD;  Location: [facility];  Service: Cardiovascular;  Laterality: N/A;  . CARDIAC CATHETERIZATION  11/13/2014   Procedure: Coronary Stent Intervention;  Surgeon: [name] [name], MD;  Location: [facility];  Service: Cardiovascular;;  . CARDIOVASCULAR STRESS TEST  01-17-2012  dr [name]   Normal  perfusion study/ no ischemia or infarction/  normal LVF and wall motion, ef 54%  . CORONARY ANGIOPLASTY WITH STENT PLACEMENT  05/04/2006   dr [name]   Severe 2-vessel obstructive CAD/  normal LVF/  PCI with DES to proxima and mid CFX and mRCA (total 3 DES)  . CRYOABLATION N/A 04/01/2014   Procedure: CRYO ABLATION PROSTATE;  Surgeon: [name], MD;  Location: [facility];  Service: Urology;  Laterality: N/A;  . DIRECT LARYNGOSCOPY N/A 09/19/2015   Procedure: DIRECT LARYNGOSCOPY;   Surgeon: Izora Gala, MD;  Location: [facility] [place];  Service: ENT;  Laterality: N/A;  . ESOPHAGOSCOPY N/A 09/19/2015   Procedure: ESOPHAGOSCOPY;  Surgeon: Izora Gala, MD;  Location: [place];  Service: ENT;  Laterality: N/A;  . FLOOR OF MOUTH BIOPSY N/A 09/19/2015   Procedure: FLOOR OF MOUTH BIOPSY;  Surgeon: Izora Gala, MD;  Location: [facility];  Service: ENT;  Laterality: N/A;  . INSERTION OF SUPRAPUBIC CATHETER N/A 04/01/2014   Procedure: INSERTION OF SUPRAPUBIC CATHETER;  Surgeon: [name], MD;  Location: [facility];  Service: Urology;  Laterality: N/A;  suprapubic   . IR GENERIC HISTORICAL  11/13/2015   IR GASTROSTOMY TUBE MOD SED 11/13/2015 [name], DO [facility]  . IR GENERIC HISTORICAL  03/26/2016   IR US GUIDE VASC ACCESS RIGHT 03/26/2016 [name], MD [facility]  . IR GENERIC HISTORICAL  03/26/2016   IR FLUORO GUIDE PORT INSERTION RIGHT 03/26/2016 [name], MD [facility]  . MULTIPLE TOOTH EXTRACTIONS  09/17/2015  . PARTIAL COLECTOMY  80's    Social History:  reports that he has never smoked. He has never used smokeless tobacco. He reports that he drinks alcohol. He reports that he does not use drugs. Patient lives at home with his wife. He is normally able to ambulate without assistance    No Known Allergies  Family History  Problem Relation Age of Onset  . Diverticulitis Mother   . Heart failure Father   . Cancer Father     prostate ca      Prior to Admission medications   Medication Sig Start Date End Date Taking? Authorizing Provider  aspirin EC 81 MG tablet Take 81 mg by mouth at bedtime.    Yes Historical Provider, MD  clopidogrel (PLAVIX) 75 MG tablet TAKE 1 TABLET EVERY DAY Patient taking differently: TAKE 1 TABLET AT BEDTIME 11/27/15  Yes [name] [name], MD  fluticasone [facility]) 50 MCG/ACT nasal spray Place 2 sprays into both nostrils at bedtime as needed for rhinitis.    Yes Historical Provider, MD  insulin aspart protamine-  aspart (NOVOLOG MIX 70/30) (70-30) 100 UNIT/ML injection Inject 10-20 Units into the skin daily before supper. Pt uses per sliding scale before the largest meal of the day.   Yes Historical Provider, MD  insulin glargine (LANTUS) 100 UNIT/ML injection Inject 100 Units into the skin at bedtime.    Yes Historical Provider, MD  ketoconazole (NIZORAL) 2 % shampoo Apply 1 application topically every 3 (three) days.   Yes Historical Provider, MD  lidocaine-prilocaine (EMLA) cream Apply 1 application topically once as needed (prior to accessing port).   Yes Historical Provider, MD  mirtazapine (REMERON) 15 MG tablet Take 1 tablet (15 mg total) by mouth at bedtime. 03/30/16  Yes Heath Lark, MD  Nutritional Supplements (FEEDING SUPPLEMENT, OSMOLITE 1.5 CAL,) LIQD Give 1 1/2 cans osmolite 1.5 via PEG QID with 60 cc free water before and after feedings.  In  addition, flush tube with 240 cc free water TID between meals. 11/19/15  Yes Eppie Gibson, MD  ondansetron (ZOFRAN) 8 MG tablet Take 1 tablet (8 mg total) by mouth every 8 (eight) hours as needed for nausea. 01/09/16  Yes Holley Bouche, NP  prochlorperazine (COMPAZINE) 10 MG tablet Take 1 tablet (10 mg total) by mouth every 6 (six) hours as needed. Patient taking differently: Take 10 mg by mouth every 6 (six) hours as needed for nausea or vomiting.  01/09/16  Yes Holley Bouche, NP  silver sulfADIAZINE (SILVADENE) 1 % cream Apply 1 application topically at bedtime. Pt applies to sacral ulceration area.   Yes Historical Provider, MD  sodium fluoride (FLUORISHIELD) 1.1 % GEL dental gel Instill one drop of gel per tooth space of fluoride tray. Place over teeth for 5 minutes. Remove. Spit out excess. Repeat nightly. 09/22/15  Yes Lenn Cal, DDS    Physical Exam: BP 92/78   Pulse 98   Temp 98.6 F (37 C) (Oral)   Resp 19   Ht 5\' 8"  (1.727 m)   Wt 81.2 kg (179 lb 0.2 oz)   SpO2 95%   BMI 27.22 kg/m   General:  Alert and oriented 3, no acute  distress  Eyes: sclera nonicteric, checking movements are intact  ENT: , normocephalic, atraumatic, mucous membranes are dry  Neck: supple, no JVD   Cardiovascular: regular rate and rhythm, S1-S2   Respiratory: clear to auscultation bilaterally   Abdomen: soft, nontender, nondistended, positive bowel sounds, PEG tube in place  Skin: no skin breaks, tears or lesions. I'm not able to appreciate the rash that was on him earlier   Musculoskeletal: clubbing or cyanosis, trace pitting edema   Psychiatric: patient is appropriate, no evidence of psychoses   Neurologic: No focal deficits            Labs on Admission:  Basic Metabolic Panel:  Recent Labs Lab 04/05/16 0801 04/05/16 0850  NA 140  --   K 3.8  --   CO2 27  --   GLUCOSE 144*  --   BUN 17.9  --   CREATININE 0.9  --   CALCIUM 10.1  --   MG 1.8  --   PHOS  --  3.8   Liver Function Tests:  Recent Labs Lab 04/05/16 0801  AST 16  ALT 14  ALKPHOS 87  BILITOT 0.58  PROT 6.9  ALBUMIN 3.5   No results for input(s): LIPASE, AMYLASE in the last 168 hours. No results for input(s): AMMONIA in the last 168 hours. CBC:  Recent Labs Lab 04/05/16 0801  WBC 8.6  NEUTROABS 7.4*  HGB 15.4  HCT 45.4  MCV 86.3  PLT 180   Cardiac Enzymes:  Recent Labs Lab 04/05/16 1139  TROPONINI <0.03    BNP (last 3 results) No results for input(s): BNP in the last 8760 hours.  ProBNP (last 3 results) No results for input(s): PROBNP in the last 8760 hours.  CBG:  Recent Labs Lab 04/05/16 1103 04/05/16 1331  GLUCAP 64* 171*    Radiological Exams on Admission: No results found.  EKG: Independently reviewed. Initially A. fib with RVR, second EKG notes sinus rhythm borderline tachycardia  Assessment/Plan Present on Admission: . New onset a-fib [facility]): Likely cause was patient receiving high-dose epinephrine for presumed code. Once his heart rate came down with Cardizem drip, he converted. Have been slowly weaning down  Cardizem drip and putting him on oral Cardizem. With any  luck, epinephrine should be out of his system and we might be able to discontinue Cardizem altogether. [name] PMR (polymyalgia rheumatica) ([place]): Continue steroids . Hypertension: Continue home medications Tongue cancer with metastases to liver  Being followed by oncology. Given occurrences, he may end up having to be on a different chemotherapy agent   . Protein-calorie malnutrition, moderate ([place]): We'll have nutrition evaluate. Continue tube feeds  . Anaphylactic reaction: Unclear if patient truly coated versus anaphylactic reaction versus straightforward passing out reaction to chemotherapy. He describes a rash which certainly does sound like anaphylaxis. I do not think that his heart actually stopped. Nevertheless, a cousin with concerns for possible anaphylaxis, will continue prednisone plus when necessary Benadryl tonight  Diabetes mellitus: We'll continue patient's high-dose Lantus plus sliding scale. Return back on his tube feeds.  Active Problems:   Hypertension   Obesity   Diabetes mellitus with diabetic neuropathy, with long-term current use of insulin ([facility])   PMR (polymyalgia rheumatica) ([facility])   Tongue cancer ([facility])   Protein-calorie malnutrition, moderate (H[facility])   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(HCC)   Dysphagia   New onset a-fib ([facility])   Anaphylactic reaction   DVT prophylaxis: Lovenox   Code Status: Full code as confirmed by patient    Family Communication:  Left message with wife  Disposition Plan: anticipate discharge tomorrow if he remains stable   Consults called: none   Admission status: given plan for discharge tomorrow, we'll place in observation status     Annita Brod MD [facility] Pager [phone]  If 7PM-7AM, please contact night-coverage [email or web address] Password [facility]  04/05/2016, 7:05 PM

## 2016-04-05 NOTE — ED Provider Notes (Signed)
[facility] DEPT Provider Note   CSN: [id number] Arrival date & time: 04/05/16  1055     History   Chief Complaint Chief Complaint  Patient presents with  . CA Pt  . Allergic Reaction  . Loss of Consciousness    HPI Benjamin Barr is a 71 y.o. male.  HPI Patient was undergoing Erbitux infusion therapy at the cancer center. Per infusion nurse, the patient went unresponsive with his eyes rolled back and did not have a pulse or respirations. They did initiate a code and chest compressions with ventilation assisted by bag-valve-mask. No medications were administered. At that time. Just prior to my arrival at the bedside for the [place], patient had regained spontaneous pulses with a narrow complex rhythm and was awake and verbally responding with intact airway. Patient was not aware of symptoms preceding the event. Upon my arrival he denied he was experiencing chest pain or feeling significantly bad. I gave orders for the patient to get an epinephrine pen injection and slight Medrol 125 mg IV. He had gotten Benadryl 50 mg IV 30 minutes before the infusion. He was denying difficulty breathing. The patient was not initially on the monitor during the onset of this event, thus there is no rhythm from the time at which he went into his pulseless event. Past Medical History:  Diagnosis Date  . Anemia   . CAD (coronary artery disease)    a. 2008 PCI: Taxus DES  to mRCA, prox & mid LCFX;  b. 10/2014 MV: extensive ischemia in the RCA territory and small area of anterior ischemia;  c. 11/2014 PCI: LM nl, LAD 30ost/prox, 80d, LCX 90ost (3.0x12 Promus Prem DES), OM2 30, RCA 64m (3.0x12 Promus Prem DES), 1m, RPDA 90 (PTCA), EF 55-65%.  . Cancer ([place])    Head & neck ca  . Diverticulosis of colon   . Heart murmur   . History of colon polyps    2014--  hyperplastic, adenoma, and benign polyps  . History of radiation therapy 10/01/15- 11/20/15   Oropharynx/base of neck 70 Gy in 35 fractions  .  Hyperlipidemia   . Hypertension   . Myocardial infarction   . Nonproliferative diabetic retinopathy ([place])   . Obesity   . Peripheral neuropathy ([place])   . Pneumonia    hx  . Prostate cancer ([place])   . RBBB (right bundle branch block)   . Skin cancer   . Type 2 diabetes mellitus [facility])     Patient Active Problem List   Diagnosis Date Noted  . New onset a-fib ([place]) 04/05/2016  . Dysphagia 03/24/2016  . Goals of care, counseling/discussion 03/23/2016  . 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([place]) 03/22/2016  . Acquired lymphedema 02/23/2016  . S/P gastrostomy ([place]) 11/17/2015  . Protein-calorie malnutrition, moderate ([place]) 11/13/2015  . Nausea without vomiting 10/28/2015  . Constipation, acute 10/28/2015  . 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 Weight loss 10/14/2015  . 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 Cancer of base of tongue ([place]) 09/22/2015  . History of prostate cancer 09/03/2015  . Tongue cancer ([place]) 09/03/2015  . Unstable angina ([place]) 11/14/2014  . Abnormal nuclear stress test 11/13/2014  . Angina pectoris ([place]) 11/13/2014  . PMR (polymyalgia rheumatica) ([facility]) 11/02/2014  . Chest pain with moderate risk for cardiac etiology 10/31/2014  . Diabetes mellitus with diabetic neuropathy, with long-term current use of insulin ([place]) 12/28/2010  . CAD S/P percutaneous coronary angioplasty   . Hypertension   .  Hyperlipidemia   . Obesity     Past Surgical History:  Procedure Laterality Date  . APPENDECTOMY    . CARDIAC CATHETERIZATION N/A 11/13/2014   Procedure: Left Heart Cath and Coronary Angiography;  Surgeon: [name] [name], MD;  Location: [facility];  Service: Cardiovascular;  Laterality: N/A;  . CARDIAC CATHETERIZATION  11/13/2014   Procedure: Coronary Stent Intervention;  Surgeon: [name] [name], MD;  Location: [facility];  Service: Cardiovascular;;  . CARDIOVASCULAR STRESS TEST  01-17-2012  dr [name]   Normal perfusion study/ no  ischemia or infarction/  normal LVF and wall motion, ef 54%  . CORONARY ANGIOPLASTY WITH STENT PLACEMENT  05/04/2006   dr [name]   Severe 2-vessel obstructive CAD/  normal LVF/  PCI with DES to proxima and mid CFX and mRCA (total 3 DES)  . CRYOABLATION N/A 04/01/2014   Procedure: CRYO ABLATION PROSTATE;  Surgeon: [name], MD;  Location: [facility];  Service: Urology;  Laterality: N/A;  . DIRECT LARYNGOSCOPY N/A 09/19/2015   Procedure: DIRECT LARYNGOSCOPY;  Surgeon: Izora Gala, MD;  Location: [facility];  Service: ENT;  Laterality: N/A;  . ESOPHAGOSCOPY N/A 09/19/2015   Procedure: ESOPHAGOSCOPY;  Surgeon: Izora Gala, MD;  Location: [facility];  Service: ENT;  Laterality: N/A;  . FLOOR OF MOUTH BIOPSY N/A 09/19/2015   Procedure: FLOOR OF MOUTH BIOPSY;  Surgeon: Izora Gala, MD;  Location: [facility] OR;  Service: ENT;  Laterality: N/A;  . INSERTION OF SUPRAPUBIC CATHETER N/A 04/01/2014   Procedure: INSERTION OF SUPRAPUBIC CATHETER;  Surgeon: [name], MD;  Location: [facility];  Service: Urology;  Laterality: N/A;  suprapubic   . IR GENERIC HISTORICAL  11/13/2015   IR GASTROSTOMY TUBE MOD SED 11/13/2015 [name], DO [facility]  . IR GENERIC HISTORICAL  03/26/2016   IR US GUIDE VASC ACCESS RIGHT 03/26/2016 [name], MD [facility]  . IR GENERIC HISTORICAL  03/26/2016   IR FLUORO GUIDE PORT INSERTION RIGHT 03/26/2016 [name], MD [facility]  . MULTIPLE TOOTH EXTRACTIONS  09/17/2015  . PARTIAL COLECTOMY  80's       Home Medications    Prior to Admission medications   Medication Sig Start Date End Date Taking? Authorizing Provider  aspirin EC 81 MG tablet Take 81 mg by mouth daily.    Historical Provider, MD  clopidogrel (PLAVIX) 75 MG tablet TAKE 1 TABLET EVERY DAY 11/27/15   [name] [name], MD  fluticasone [facility]) 50 MCG/ACT nasal spray Place 2 sprays into both nostrils daily. 08/20/15   Historical Provider, MD  hydrochlorothiazide  (HYDRODIURIL) 25 MG tablet Take 1 tablet (25 mg total) by mouth daily. 12/29/15   [name] [name], MD  HYDROcodone-acetaminophen (HYCET) 7.5-325 mg/15 ml solution Take 10-80mL every 4 hours as needed for pain. 12/10/15   Eppie Gibson, MD  insulin aspart protamine- aspart (NOVOLOG MIX 70/30) (70-30) 100 UNIT/ML injection Inject 10-20 Units into the skin daily with supper. 1 unit before large meal. Per sliding scale    Historical Provider, MD  insulin glargine (LANTUS) 100 UNIT/ML injection Inject 100 Units into the skin daily.     Historical Provider, MD  lidocaine (XYLOCAINE) 2 % solution Patient: Mix 1part 2% viscous lidocaine, 1part H20. Swish and swallow 78mL of this mixture, 3min before meals and at bedtime, up to QID 10/06/15   Eppie Gibson, MD  mirtazapine (REMERON) 15 MG tablet Take 1 tablet (15 mg total) by mouth at bedtime.  03/30/16   Heath Lark, MD  Nutritional Supplements (FEEDING SUPPLEMENT, OSMOLITE 1.5 CAL,) LIQD Give 1 1/2 cans osmolite 1.5 via PEG QID with 60 cc free water before and after feedings.  In addition, flush tube with 240 cc free water TID between meals. 11/19/15   Eppie Gibson, MD  ondansetron (ZOFRAN) 8 MG tablet Take 1 tablet (8 mg total) by mouth every 8 (eight) hours as needed for nausea. 01/09/16   Holley Bouche, NP  pantoprazole (PROTONIX) 40 MG tablet Take 1 tablet (40 mg total) by mouth daily. 01/09/16   Holley Bouche, NP  polyethylene glycol (MIRALAX / Floria Raveling) packet Take 17 g by mouth daily as needed.    Historical Provider, MD  prochlorperazine (COMPAZINE) 10 MG tablet Take 1 tablet (10 mg total) by mouth every 6 (six) hours as needed. 01/09/16   Holley Bouche, NP  scopolamine (TRANSDERM-SCOP) 1 MG/3DAYS Place 1 patch (1.5 mg total) onto the skin every 3 (three) days. 11/11/15   Heath Lark, MD  senna (SENOKOT) 8.6 MG tablet Take 1 tablet by mouth daily as needed for constipation.    Historical Provider, MD  silver sulfADIAZINE (SILVADENE) 1 % cream Apply 1  application topically daily. For sacral ulceration area    Historical Provider, MD  simvastatin (ZOCOR) 40 MG tablet  01/24/16   Historical Provider, MD  sodium fluoride (FLUORISHIELD) 1.1 % GEL dental gel Instill one drop of gel per tooth space of fluoride tray. Place over teeth for 5 minutes. Remove. Spit out excess. Repeat nightly. 09/22/15   Lenn Cal, DDS  sucralfate (CARAFATE) 1 g tablet Dissolve 1 tablet in 10 mL H20 and swallow up to QID for sore throat. 10/06/15   Eppie Gibson, MD    Family History Family History  Problem Relation Age of Onset  . Diverticulitis Mother   . Heart failure Father   . Cancer Father     prostate ca    Social History Social History  Substance Use Topics  . Smoking status: Never Smoker  . Smokeless tobacco: Never Used  . Alcohol use Yes     Comment: occ beer     Allergies   Patient has no known allergies.   Review of Systems Review of Systems 10 Systems reviewed and are negative for acute change except as noted in the HPI.   Physical Exam Updated Vital Signs BP 122/76   Pulse 98   Temp 97.9 F (36.6 C) (Oral)   Resp 17   SpO2 96%   Physical Exam  Constitutional: He is oriented to person, place, and time. He appears well-developed and well-nourished.  At my first assessment the patient, he is breathing through a nonrebreather mask but alert and with a stable airway. Answering questions.  HENT:  Head: Normocephalic and atraumatic.  Mouth/Throat: Oropharynx is clear and moist.  Eyes: Conjunctivae and EOM are normal.  Cardiovascular:  No murmur heard. Tachycardia irregularly irregular. Radial pulses are intact.  Pulmonary/Chest: Effort normal and breath sounds normal. No respiratory distress.  Abdominal: Soft. There is no tenderness.  Genitourinary: No penile tenderness.  Musculoskeletal: He exhibits no edema.  Neurological: He is alert and oriented to person, place, and time. He exhibits normal muscle tone. Coordination  normal.  Skin: Skin is warm and dry.  Psychiatric: He has a normal mood and affect.  Nursing note and vitals reviewed.    ED Treatments / Results  Labs (all labs ordered are listed, but only abnormal results are displayed) Labs  Reviewed  CBG MONITORING, ED - Abnormal; Notable for the following:       Result Value   Glucose-Capillary 64 (*)    All other components within normal limits  CBG MONITORING, ED - Abnormal; Notable for the following:    Glucose-Capillary 171 (*)    All other components within normal limits  TROPONIN I  CBG MONITORING, ED    EKG  EKG Interpretation  Date/Time:  Monday April 05 2016 11:05:35 EST Ventricular Rate:  164 PR Interval:    QRS Duration: 149 QT Interval:  323 QTC Calculation: 534 R Axis:   156 Text Interpretation:  Extreme tachycardia with wide complex, no further rhythm analysis attempted QRS morphology consistent with old, tachycardia. Confirmed by Johnney Killian, MD, Jeannie Done [phone]) on 04/05/2016 11:25:31 AM       Radiology No results found.  Procedures Procedures (including critical care time) CRITICAL CARE Performed by: Charlesetta Shanks   Total critical care time: 30 minutes  Critical care time was exclusive of separately billable procedures and treating other patients.  Critical care was necessary to treat or prevent imminent or life-threatening deterioration.  Critical care was time spent personally by me on the following activities: development of treatment plan with patient and/or surrogate as well as nursing, discussions with consultants, evaluation of patient's response to treatment, examination of patient, obtaining history from patient or surrogate, ordering and performing treatments and interventions, ordering and review of laboratory studies, ordering and review of radiographic studies, pulse oximetry and re-evaluation of patient's condition. Medications Ordered in ED Medications  diltiazem (CARDIZEM) 1 mg/mL load via  infusion 15 mg (15 mg Intravenous Bolus from Bag 04/05/16 1345)    And  diltiazem (CARDIZEM) 100 mg in dextrose 5% 111mL (1 mg/mL) infusion (5 mg/hr Intravenous New Bag/Given 04/05/16 1346)  dextrose 5 % and 0.9% NaCl 5-0.9 % bolus 500 mL (500 mLs Intravenous New Bag/Given 04/05/16 1135)     Initial Impression / Assessment and Plan / ED Course  I have reviewed the triage vital signs and the nursing notes.  Pertinent labs & imaging results that were available during my care of the patient were reviewed by me and considered in my medical decision making (see chart for details).     Consult: Patient's case reviewed with Dr.Sendil for admission.  Final Clinical Impressions(s) / ED Diagnoses   Final diagnoses:  Anaphylaxis, initial encounter  Atrial fibrillation with RVR ([place])  Tongue cancer [facility])   Patient with an episode of arrest in the [facility] infusion room as outlined above. Dr. Alvy Bimler reports that this is an idiosyncratic but known anaphylactic type of reaction to the infusion. She recommends for hospital observation. He was administered IM EpiPen and Solu-Medrol at the infusion facility by my orders during resuscitation. Heart rate remained elevated from 140s to 160s. EKG and rhythm on the monitor indicated H of fibrillation. Patient was given a bolus of fluids and observed. Heart rate did not decrease spontaneously with these interventions. At that time, I felt that this was tachycardia beyond will be anticipated from the patient's event and the administration of medications. Cardizem bolus given with good effect. Heart rate in the 80s and blood pressure stable with the patient asymptomatic.   New Prescriptions New Prescriptions   No medications on file     Charlesetta Shanks, MD 04/05/16 1424

## 2016-04-05 NOTE — Progress Notes (Signed)
[facility] Spiritual Care Note  Responded to code in [facility] infusion room.  Assumed spiritual and emotional support of Benjamin Barr wife Benjamin Barr, who was present at bedside during precipitating event, from our artist-in-residence Benjamin Barr, who is acquainted with Benjamin Barr from the [facility] [place].  Benjamin Barr provided Benjamin Barr immediate support, calming presence, and redirection to a quiet place for further care.  Benjamin Barr was much relieved when Benjamin Barr was able to speak and joke again.  RNs, [facility] Benjamin Barr/RN, and others provided info, reassurance, and updates.  Provided emotional support and normalizing of feelings during crisis/traumatic events, following up with couple in [facility] ED for further care.  Brought their personal belongings from [facility].  Normalized that a such a sudden stressful event can cause further emotional/physical reactions  (exhaustion, tearfulness, worry/anxiety) later on, even with a good outcome.  Referring to [facility] chaplain for inpt f/u.  With Benjamin Barr's permission, plan to call her later in the week to check in.   [place], [place], [facility] M-F daytime pager [phone] [facility] 24/7 pager [phone] Voicemail [phone]

## 2016-04-05 NOTE — ED Notes (Signed)
RN at bedside

## 2016-04-05 NOTE — Progress Notes (Signed)
During Erbitux infusion, patient became unresponsive and pulseless with agonal respirations. Erbitux infusion stopped, compressions initiated and code called. IV N/S initiated at a bolus rate via PAC. CPR board placed behind patient, defib pads applied, and assisted ventilations with 100% oxygen via BVM. Compressions continued. Code team arrived. Patient experienced SROC (palpable pulse) with narrow complex tachycardia noted on the monitor. Compressions stopped. Continue to support ventilations with BVM. Patient continues to be non-verbal. Medicated per [facility]. Patient's LOC improved - awake and alert with confusion related to event. States he does not remember the episode. Oxygen changed to 15 Lpm via NRB with adequate spontaneous respirations. Patient transferred to stretcher and taken to ED for further evaluation and treatment. Patient left Infusion Room awake and alert. Wife remained with patient during event.

## 2016-04-05 NOTE — ED Notes (Signed)
Bed: WA09 Expected date:  Expected time:  Means of arrival:  Comments: cx center pt

## 2016-04-05 NOTE — ED Provider Notes (Signed)
Procedures   I responded to CODE BLUE in the cancer treatment center.   Patient have been getting a Erbitux infusion and had sudden loss of consciousness and pulses. Dr. Alvy Bimler advised this is consistent with anaphylactic reaction from the infusion. Chest compressions and airway support by bag-valve-mask initiated prior to my arrival.  On arrival, the patient had regained pulses which were narrow complex on the monitor with an irregular rhythm. I did assess the patient and radial pulses palpable. His mental status had returned to awake and oriented. He was appropriately responsive giving his full name and answering questions. Airway was intact.   I ordered administration of one dose of epinephrine pen and Solu-Medrol 125 mg IV. His were administered at the infusion center. I remained at the bedside and observed patient's heart rhythm which remained narrow complex with intermittent PVCs rates from 80s to low 100s. Patient's airway remained stable with spontaneous speech and breathing.  At that time he was transferred to a rolling stretcher and I walked with the patient on a monitor to the emergency department to continue care.   Charlesetta Shanks, MD 04/05/16 [phone]

## 2016-04-05 NOTE — Patient Instructions (Addendum)
[facility] Discharge Instructions for Patients Receiving Chemotherapy  Today you received the following chemotherapy agents:  Erbitux, Carboplatin, and 5FU.  To help prevent nausea and vomiting after your treatment, we encourage you to take your nausea medication as directed.   If you develop nausea and vomiting that is not controlled by your nausea medication, call the clinic.   BELOW ARE SYMPTOMS THAT SHOULD BE REPORTED IMMEDIATELY:  *FEVER GREATER THAN 100.5 F  *CHILLS WITH OR WITHOUT FEVER  NAUSEA AND VOMITING THAT IS NOT CONTROLLED WITH YOUR NAUSEA MEDICATION  *UNUSUAL SHORTNESS OF BREATH  *UNUSUAL BRUISING OR BLEEDING  TENDERNESS IN MOUTH AND THROAT WITH OR WITHOUT PRESENCE OF ULCERS  *URINARY PROBLEMS  *BOWEL PROBLEMS  UNUSUAL RASH Items with * indicate a potential emergency and should be followed up as soon as possible.  Feel free to call the clinic you have any questions or concerns. The clinic phone number is [phone].  Please show the CHEMO ALERT CARD at check-in to the Emergency Department and triage nurse.   

## 2016-04-06 ENCOUNTER — Encounter: Payer: Self-pay | Admitting: Physical Therapy

## 2016-04-06 DIAGNOSIS — E1140 Type 2 diabetes mellitus with diabetic neuropathy, unspecified: Secondary | ICD-10-CM

## 2016-04-06 DIAGNOSIS — T782XXD Anaphylactic shock, unspecified, subsequent encounter: Secondary | ICD-10-CM | POA: Diagnosis not present

## 2016-04-06 DIAGNOSIS — C01 Malignant neoplasm of base of tongue: Secondary | ICD-10-CM | POA: Diagnosis not present

## 2016-04-06 DIAGNOSIS — T886XXA Anaphylactic reaction due to adverse effect of correct drug or medicament properly administered, initial encounter: Secondary | ICD-10-CM | POA: Diagnosis not present

## 2016-04-06 DIAGNOSIS — C787 Secondary malignant neoplasm of liver and intrahepatic bile duct: Secondary | ICD-10-CM

## 2016-04-06 DIAGNOSIS — Z794 Long term (current) use of insulin: Secondary | ICD-10-CM

## 2016-04-06 DIAGNOSIS — I4891 Unspecified atrial fibrillation: Secondary | ICD-10-CM

## 2016-04-06 DIAGNOSIS — T451X5A Adverse effect of antineoplastic and immunosuppressive drugs, initial encounter: Secondary | ICD-10-CM | POA: Diagnosis not present

## 2016-04-06 LAB — GLUCOSE, CAPILLARY
GLUCOSE-CAPILLARY: 362 mg/dL — AB (ref 65–99)
GLUCOSE-CAPILLARY: 210 mg/dL — AB (ref 65–99)
Glucose-Capillary: 442 mg/dL — ABNORMAL HIGH (ref 65–99)
Glucose-Capillary: 230 mg/dL — ABNORMAL HIGH (ref 65–99)

## 2016-04-06 MED ORDER — METOPROLOL SUCCINATE ER 25 MG PO TB24: 12.5000 mg | ORAL_TABLET | Freq: Every day | ORAL | 0 refills | Status: DC

## 2016-04-06 MED ORDER — DILTIAZEM HCL ER COATED BEADS 120 MG PO CP24: 120.0000 mg | ORAL_CAPSULE | Freq: Every day | ORAL | Status: DC

## 2016-04-06 MED ORDER — INSULIN ASPART 100 UNIT/ML ~~LOC~~ SOLN
10.0000 [IU] | Freq: Once | SUBCUTANEOUS | Status: AC
  Administered 2016-04-06: 10 [IU] via SUBCUTANEOUS

## 2016-04-06 MED ORDER — HEPARIN SOD (PORK) LOCK FLUSH 100 UNIT/ML IV SOLN
500.0000 [IU] | INTRAVENOUS | Status: AC | PRN
  Administered 2016-04-06: 500 [IU]

## 2016-04-06 MED ORDER — METOPROLOL SUCCINATE ER 25 MG PO TB24
12.5000 mg | ORAL_TABLET | Freq: Every day | ORAL | Status: DC
  Administered 2016-04-06: 12.5 mg via ORAL
  Filled 2016-04-06: qty 1

## 2016-04-06 NOTE — Discharge Summary (Signed)
Physician Discharge Summary  Benjamin Barr [id number] DOB: [birth year] DOA: 04/05/2016  PCP: [name], MD  Admit date: 04/05/2016 Discharge date: 04/06/2016  Admitted From: Home by way of cancer center Disposition: Home   Recommendations for Outpatient Follow-up:  1. Follow up with PCP in 1-2 weeks 2. Please obtain BMP/CBC in one week 3. Please follow up on the following pending results:  Home Health: None Equipment/Devices: None Discharge Condition: Stable CODE STATUS: Full Diet recommendation: Heart healthy, carb modified.   Brief/Interim Summary: Benjamin Barr is a 71 y.o. 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 He developed a rash and subsequently passed out and went into cardiorespiratory arrest. A code blue was performed with CPR and epinephrine administration with ROSC. Patient was also given Solu-Medrol for presumed anaphylaxis. Shortly after the code, new onset rapid atrial fibrillation was noted. He was started on a Cardizem drip and rapidly converted to NSR. He was admitted to the SDU for observation and had no overnight events. He has maintained NSR and has no respiratory symptoms. Rash has resolved. Cetuximab has been added to allergies list. He will be discharged on low dose metoprolol and will be contacted by his cardiologist's office to schedule follow up.   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  Rapidly resolved on cardizem gtt, transitioned to low dose po cardizem and has maintained sinus rhythm since 13:42 yesterday.  - Suspect this will not continue to be an issue outside the scope of these events, though his BP seems it would tolerate beta blocker and with a history of CAD, will start low dose metoprolol.  - [facility] cardiology who will schedule an appointment with the patient soon.   Anaphylactic reaction with cardiorespiratory arrest: Due to cetuximab which is on allergies list now. Resolved.   PMR (polymyalgia rheumatica): Continue home steroids  Hypertension: Chronic, stable. Continue home medications in addition to metoprolol.  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 Alvy Bimler this morning who will follow up in next week.   Protein-calorie malnutrition, moderate ([place]): We'll have nutrition evaluate. Continue tube feeds.  Diabetes mellitus: Continue home insulin  Discharge Instructions Discharge Instructions    Discharge instructions    Complete by:  As directed    You were admitted after an anaphylactic reaction to chemotherapy. This drug has been added to your allergy list. You required CPR and medications to restart your heart which caused your heart to beat rapidly and irregularly. This has resolved with medication and you are stable for discharge this morning with the following recommendations:  - To control your heart rate, take metoprolol once daily until you follow up with your cardiologist.  - Follow up with oncology as scheduled - If you noticed heart palpitations, shortness of breath or chest pain you must seek medical attention right away.     Allergies as of 04/06/2016      Reactions   Cetuximab Anaphylaxis  Medication List    TAKE these medications   aspirin EC 81 MG tablet Take 81 mg by mouth at bedtime.   clopidogrel 75 MG tablet Commonly known as:  PLAVIX TAKE 1 TABLET EVERY DAY What changed:  See the new  instructions.   feeding supplement (OSMOLITE 1.5 CAL) Liqd Give 1 1/2 cans osmolite 1.5 via PEG QID with 60 cc free water before and after feedings.  In addition, flush tube with 240 cc free water TID between meals.   fluticasone 50 MCG/ACT nasal spray Commonly known as:  FLONASE Place 2 sprays into both nostrils at bedtime as needed for rhinitis.   insulin aspart protamine- aspart (70-30) 100 UNIT/ML injection Commonly known as:  NOVOLOG MIX 70/30 Inject 10-20 Units into the skin daily before supper. Pt uses per sliding scale before the largest meal of the day.   insulin glargine 100 UNIT/ML injection Commonly known as:  LANTUS Inject 100 Units into the skin at bedtime.   ketoconazole 2 % shampoo Commonly known as:  NIZORAL Apply 1 application topically every 3 (three) days.   lidocaine-prilocaine cream Commonly known as:  EMLA Apply 1 application topically once as needed (prior to accessing port).   metoprolol succinate 25 MG 24 hr tablet Commonly known as:  TOPROL-XL Take 0.5 tablets (12.5 mg total) by mouth daily.   mirtazapine 15 MG tablet Commonly known as:  REMERON Take 1 tablet (15 mg total) by mouth at bedtime.   ondansetron 8 MG tablet Commonly known as:  ZOFRAN Take 1 tablet (8 mg total) by mouth every 8 (eight) hours as needed for nausea.   prochlorperazine 10 MG tablet Commonly known as:  COMPAZINE Take 1 tablet (10 mg total) by mouth every 6 (six) hours as needed. What changed:  reasons to take this   silver sulfADIAZINE 1 % cream Commonly known as:  SILVADENE Apply 1 application topically at bedtime. Pt applies to sacral ulceration area.   sodium fluoride 1.1 % Gel dental gel Commonly known as:  FLUORISHIELD Instill one drop of gel per tooth space of fluoride tray. Place over teeth for 5 minutes. Remove. Spit out excess. Repeat nightly.      Follow-up Information    [name], MD. Call.   Specialty:  Family Medicine Contact  information: [address] [address] [phone]        Heath Lark, MD Follow up.   Specialty:  Hematology and Oncology Why:  As scheduled Contact information: [place] [address] [phone]        [name], MD. Schedule an appointment as soon as possible for a visit in 1 week(s).   Specialty:  Cardiology Contact information: [address] [place] [address] [phone]          Allergies  Allergen Reactions  . Cetuximab Anaphylaxis    Consultations:  Dr. Alvy Bimler, oncology, saw the patient.  Procedures/Studies: Mr Liver W Wo Contrast  Result Date: 03/18/2016 CLINICAL DATA:  Base of tongue carcinoma. Hypermetabolic liver lesion seen on recent PET scan. EXAM: MRI ABDOMEN WITHOUT AND WITH CONTRAST TECHNIQUE: Multiplanar multisequence MR imaging of the abdomen was performed both before and after the administration of intravenous contrast. CONTRAST:  9mL MULTIHANCE GADOBENATE DIMEGLUMINE 529 MG/ML IV SOLN COMPARISON:  PET-CT on 03/11/2016 FINDINGS: Lower chest: No acute findings. Hepatobiliary: A heterogeneously enhancing hypovascular mass is seen in the caudate lobe of liver which measures 3.4 x 2.9 cm on image 21/3. This  is consistent with liver metastasis. No other liver masses are identified. Tiny gallstones are noted, however there is no evidence of cholecystitis or biliary ductal dilatation. Pancreas:  No mass or inflammatory changes. Spleen:  Within normal limits in size and appearance. Adrenals/Urinary Tract: Several renal cysts are seen bilaterally. No masses identified. No evidence of hydronephrosis. Stomach/Bowel: Colonic diverticulosis is seen, however there is no evidence of diverticulitis involving the visualized portion of the colon. Percutaneous gastrostomy tube seen in appropriate position. Vascular/Lymphatic: No pathologically enlarged lymph nodes identified. No abdominal aortic  aneurysm. Other:  None. Musculoskeletal:  No suspicious bone lesions identified. IMPRESSION: 3.4 cm heterogeneously enhancing mass in caudate lobe of liver, consistent liver metastasis. No other sites of metastatic disease identified within the liver or abdomen. Cholelithiasis.  No radiographic evidence of cholecystitis. Electronically Signed   By: Earle Gell M.D.   On: 03/18/2016 08:21   Nm Pet Image Restag (ps) Skull Base To Thigh  Result Date: 03/11/2016 CLINICAL DATA:  Subsequent treatment strategy for base of tongue carcinoma. Status post radiation therapy. EXAM: NUCLEAR MEDICINE PET SKULL BASE TO THIGH TECHNIQUE: 8.5 mCi F-18 FDG was injected intravenously. Full-ring PET imaging was performed from the skull base to thigh after the radiotracer. CT data was obtained and used for attenuation correction and anatomic localization. FASTING BLOOD GLUCOSE:  Value: 70 mg/dl COMPARISON:  08/29/2015 FINDINGS: NECK No hypermetabolic lymph nodes in the neck. Previously seen right level 5A hypermetabolic lymphadenopathy has resolved since previous study. CHEST No hypermetabolic mediastinal or hilar nodes. No suspicious pulmonary nodules on the CT scan. The new patchy airspace disease is seen in both lung apices which shows hypermetabolic activity, and is likely due to radiation pneumonitis, with other infectious or inflammatory etiologies considered less likely. Aortic and coronary artery atherosclerosis again noted. ABDOMEN/PELVIS A new hypermetabolic lesion is seen in the caudate lobe of the liver which measures approximately 2.8 x 1.8 cm on image 96/4. Although no other liver lesions are identified, this is suspicious for liver metastasis. No other hypermetabolic masses or lymphadenopathy identified. Percutaneous gastrostomy tube in appropriate position. Tiny calcified gallstones again noted, without evidence of cholecystitis. Fluid attenuation right renal cyst shows no FDG uptake. Diffuse colonic diverticulosis  again demonstrated, without evidence of diverticulitis. 3 cm lipoma again seen within the cecum without FDG uptake. Aortic atherosclerosis. Stable mildly enlarged prostate. SKELETON No focal hypermetabolic activity to suggest skeletal metastasis. IMPRESSION: Resolution of hypermetabolic right cervical lymphadenopathy since prior study . No other hypermetabolic lymphadenopathy identified. New hypermetabolic lesion and caudate lobe of liver, highly suspicious for liver metastasis. Recommend liver protocol abdomen MRI without and with contrast for further evaluation. New patchy airspace disease in both lung apices with FDG uptake. 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 Recommend continued attention on follow-up imaging. Electronically Signed   By: Earle Gell M.D.   On: 03/11/2016 09:58   Ir US Guide Vasc Access Right  Result Date: 03/26/2016 CLINICAL DATA:  METASTATIC TONGUE CANCER, ACCESS CHEMOTHERAPY EXAM: RIGHT INTERNAL JUGULAR SINGLE LUMEN POWER PORT CATHETER INSERTION Date:  2/16/20182/16/2018 3:31 pm Radiologist:  M. Daryll Brod, MD Guidance:  Ultrasound and fluoroscopic MEDICATIONS: 2 g Ancef; The antibiotic was administered within an appropriate time interval prior to skin puncture. ANESTHESIA/SEDATION: Versed 3.0 mg IV; Fentanyl 50 mcg IV; Moderate Sedation Time:  35 The patient was continuously monitored during the procedure by the interventional radiology nurse under my direct supervision. FLUOROSCOPY TIME:  48 seconds (13  mGy) COMPLICATIONS: None immediate. CONTRAST:  None. PROCEDURE: Informed consent was obtained from the patient following explanation of the procedure, risks, benefits and alternatives. The patient understands, agrees and consents for the procedure. All questions were addressed. A time out was performed. Maximal barrier sterile technique utilized including caps, mask, sterile gowns, sterile gloves,  large sterile drape, hand hygiene, and 2% chlorhexidine scrub. Under sterile conditions and local anesthesia, right internal jugular micropuncture venous access was performed. Access was performed with ultrasound. Images were obtained for documentation. A guide wire was inserted followed by a transitional dilator. This allowed insertion of a guide wire and catheter into the IVC. Measurements were obtained from the SVC / RA junction back to the right IJ venotomy site. In the right infraclavicular chest, a subcutaneous pocket was created over the second anterior rib. This was done under sterile conditions and local anesthesia. 1% lidocaine with epinephrine was utilized for this. A 2.5 cm incision was made in the skin. Blunt dissection was performed to create a subcutaneous pocket over the right pectoralis major muscle. The pocket was flushed with saline vigorously. There was adequate hemostasis. The port catheter was assembled and checked for leakage. The port catheter was secured in the pocket with two retention sutures. The tubing was tunneled subcutaneously to the right venotomy site and inserted into the SVC/RA junction through a valved peel-away sheath. Position was confirmed with fluoroscopy. Images were obtained for documentation. The patient tolerated the procedure well. No immediate complications. Incisions were closed in a two layer fashion with 4 - 0 Vicryl suture. Dermabond was applied to the skin. The port catheter was accessed, blood was aspirated followed by saline and heparin flushes. Needle was removed. A dry sterile dressing was applied. IMPRESSION: Ultrasound and fluoroscopically guided right internal jugular single lumen power port catheter insertion. Tip in the SVC/RA junction. Catheter ready for use. Electronically Signed   By: Jerilynn Mages.  Shick M.D.   On: 03/26/2016 15:39   Ir Fluoro Guide Port Insertion Right  Result Date: 03/26/2016 CLINICAL DATA:  METASTATIC TONGUE CANCER, ACCESS CHEMOTHERAPY  EXAM: RIGHT INTERNAL JUGULAR SINGLE LUMEN POWER PORT CATHETER INSERTION Date:  2/16/20182/16/2018 3:31 pm Radiologist:  M. Daryll Brod, MD Guidance:  Ultrasound and fluoroscopic MEDICATIONS: 2 g Ancef; The antibiotic was administered within an appropriate time interval prior to skin puncture. ANESTHESIA/SEDATION: Versed 3.0 mg IV; Fentanyl 50 mcg IV; Moderate Sedation Time:  35 The patient was continuously monitored during the procedure by the interventional radiology nurse under my direct supervision. FLUOROSCOPY TIME:  48 seconds (13 mGy) COMPLICATIONS: None immediate. CONTRAST:  None. PROCEDURE: Informed consent was obtained from the patient following explanation of the procedure, risks, benefits and alternatives. The patient understands, agrees and consents for the procedure. All questions were addressed. A time out was performed. Maximal barrier sterile technique utilized including caps, mask, sterile gowns, sterile gloves, large sterile drape, hand hygiene, and 2% chlorhexidine scrub. Under sterile conditions and local anesthesia, right internal jugular micropuncture venous access was performed. Access was performed with ultrasound. Images were obtained for documentation. A guide wire was inserted followed by a transitional dilator. This allowed insertion of a guide wire and catheter into the IVC. Measurements were obtained from the SVC / RA junction back to the right IJ venotomy site. In the right infraclavicular chest, a subcutaneous pocket was created over the second anterior rib. This was done under sterile conditions and local anesthesia. 1% lidocaine with epinephrine was utilized for this. A 2.5 cm incision was made in the  skin. Blunt dissection was performed to create a subcutaneous pocket over the right pectoralis major muscle. The pocket was flushed with saline vigorously. There was adequate hemostasis. The port catheter was assembled and checked for leakage. The port catheter was secured in the  pocket with two retention sutures. The tubing was tunneled subcutaneously to the right venotomy site and inserted into the SVC/RA junction through a valved peel-away sheath. Position was confirmed with fluoroscopy. Images were obtained for documentation. The patient tolerated the procedure well. No immediate complications. Incisions were closed in a two layer fashion with 4 - 0 Vicryl suture. Dermabond was applied to the skin. The port catheter was accessed, blood was aspirated followed by saline and heparin flushes. Needle was removed. A dry sterile dressing was applied. IMPRESSION: Ultrasound and fluoroscopically guided right internal jugular single lumen power port catheter insertion. Tip in the SVC/RA junction. Catheter ready for use. Electronically Signed   By: Jerilynn Mages.  Shick M.D.   On: 03/26/2016 15:39   Subjective: Pt feels well, back to baseline. Has mid chest soreness after compressions that is improving, not exertional pain. No dyspnea or palpitations. Doesn't want any more steroids because of hyperglycemia.   Discharge Exam: BP 114/76 (BP Location: Left Arm)   Pulse 98   Temp 97.7 F (36.5 C) (Oral)   Resp (!) 23   Ht 5\' 8"  (1.727 m)   Wt 81.2 kg (179 lb 0.2 oz)   SpO2 98%   BMI 27.22 kg/m   General: Pt is alert, awake, not in acute distress Cardiovascular: RRR, S1/S2 +, no rubs, no gallops Respiratory: CTA bilaterally, no wheezing, no rhonchi Abdominal: Soft, NT, ND, bowel sounds + Extremities: No edema, no cyanosis  The results of significant diagnostics from this hospitalization (including imaging, microbiology, ancillary and laboratory) are listed below for reference.    Labs: Basic Metabolic Panel:  Recent Labs Lab 04/05/16 0801 04/05/16 0850 04/05/16 2014  NA 140  --   --   K 3.8  --   --   CO2 27  --   --   GLUCOSE 144*  --   --   BUN 17.9  --   --   CREATININE 0.9  --  1.03  CALCIUM 10.1  --   --   MG 1.8  --   --   PHOS  --  3.8  --    Liver Function  Tests:  Recent Labs Lab 04/05/16 0801  AST 16  ALT 14  ALKPHOS 87  BILITOT 0.58  PROT 6.9  ALBUMIN 3.5   CBC:  Recent Labs Lab 04/05/16 0801 04/05/16 2014  WBC 8.6 13.2*  NEUTROABS 7.4*  --   HGB 15.4 15.1  HCT 45.4 44.6  MCV 86.3 83.5  PLT 180 180   Cardiac Enzymes:  Recent Labs Lab 04/05/16 1139  TROPONINI <0.03   CBG:  Recent Labs Lab 04/05/16 1331 04/05/16 2323 04/06/16 0225 04/06/16 0702 04/06/16 0826  GLUCAP 171* 362* 442* 230* 210*   Urinalysis    Component Value Date/Time   COLORURINE YELLOW 11/14/2014 0920   APPEARANCEUR CLEAR 11/14/2014 0920   LABSPEC 1.024 11/14/2014 0920   PHURINE 5.5 11/14/2014 0920   GLUCOSEU >1000 (A) 11/14/2014 0920   HGBUR SMALL (A) 11/14/2014 0920   BILIRUBINUR NEGATIVE 11/14/2014 0920   KETONESUR 15 (A) 11/14/2014 0920   PROTEINUR 100 (A) 11/14/2014 0920   UROBILINOGEN 1.0 11/14/2014 0920   NITRITE NEGATIVE 11/14/2014 0920   LEUKOCYTESUR NEGATIVE 11/14/2014 0920  Microbiology Recent Results (from the past 240 hour(s))  MRSA PCR Screening     Status: None   Collection Time: 04/05/16  2:57 PM  Result Value Ref Range Status   MRSA by PCR NEGATIVE NEGATIVE Final    Comment:        The GeneXpert MRSA Assay (FDA approved for NASAL specimens only), is one component of a comprehensive MRSA colonization surveillance program. It is not intended to diagnose MRSA infection nor to guide or monitor treatment for MRSA infections.     Time coordinating discharge: Approximately 40 minutes  Vance Gather, MD  [facility] 04/06/2016, 9:13 AM Pager [phone]  If 7PM-7AM, please contact night-coverage [email or web address] Password [email or web address]

## 2016-04-06 NOTE — Progress Notes (Signed)
Order written for patient to be discharge home. Discharge follow up appointment, instructions, and new medication (metoprolol) reviewed with patient and spouse. Both patient and spouse state understanding of discharge AVS (after visit summary) without any further questions.

## 2016-04-06 NOTE — Progress Notes (Signed)
Benjamin Barr   DOB:1945-07-23   [id number]    Subjective: The patient is well known to me.  He developed anaphylactic reaction during chemotherapy and was transferred to the emergency room, subsequently admitted for management of atrial fibrillation and recent resuscitation. He feels well this morning.  Denies significant chest pain or discomfort.  The skin rash on his torso had resolved  Objective:  Vitals:   04/06/16 0600 04/06/16 0700  BP:    Pulse:    Resp: 17 (!) 25  Temp:       Intake/Output Summary (Last 24 hours) at 04/06/16 0756 Last data filed at 04/05/16 2000  Gross per 24 hour  Intake           761.92 ml  Output                0 ml  Net           761.92 ml    GENERAL:alert, no distress and comfortable SKIN: skin color, texture, turgor are normal, no rashes or significant lesions EYES: normal, Conjunctiva are pink and non-injected, sclera clear Musculoskeletal:no cyanosis of digits and no clubbing  NEURO: alert & oriented x 3 with fluent speech, no focal motor/sensory deficits   Labs:  Lab Results  Component Value Date   WBC 13.2 (H) 04/05/2016   HGB 15.1 04/05/2016   HCT 44.6 04/05/2016   MCV 83.5 04/05/2016   PLT 180 04/05/2016   NEUTROABS 7.4 (H) 04/05/2016    Lab Results  Component Value Date   NA 140 04/05/2016   K 3.8 04/05/2016   CL 102 09/15/2015   CO2 27 04/05/2016    Assessment & Plan:   Anaphylactic reaction to cetuximab Cetuximab is entered in his allergy profile. 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  Atrial fibrillation with rapid ventricular response Likely triggered by recent resuscitation effort.  Continue medical management  Discharge planning Will defer to primary service.  I have rescheduled his appointment to resume treatment next week on 04/12/2016.  I will sign off.  Please call if questions arise   Heath Lark, MD 04/06/2016  7:56  AM

## 2016-04-08 ENCOUNTER — Encounter: Payer: Self-pay | Admitting: General Practice

## 2016-04-08 NOTE — Progress Notes (Signed)
[facility] Note  Reached pt's wife Benjamin Barr by phone to f/u after pt's code and admission.  (Per spouse, Mr Mactavish asked her to grab the phone because he is still too sore from chest compressions to move so much and so fast.)  Benjamin Barr spoke frankly about her emotional processing and knows how to reach the Support Team for further care.  In the meantime, we plan for me to f/u in infusion on Monday 3/5.  Please also page if immediate needs arise.  Thank you.   [place], [place], [facility] Pager [phone] Voicemail [phone]

## 2016-04-12 ENCOUNTER — Emergency Department (HOSPITAL_COMMUNITY): Payer: Medicare Other

## 2016-04-12 ENCOUNTER — Other Ambulatory Visit: Payer: Self-pay

## 2016-04-12 ENCOUNTER — Telehealth: Payer: Self-pay | Admitting: *Deleted

## 2016-04-12 ENCOUNTER — Emergency Department (HOSPITAL_COMMUNITY)
Admission: EM | Admit: 2016-04-12 | Discharge: 2016-04-12 | Disposition: A | Discharge status: Home / Self Care | Payer: Medicare Other | Attending: Emergency Medicine | Admitting: Emergency Medicine

## 2016-04-12 ENCOUNTER — Encounter: Payer: Self-pay | Admitting: Nutrition

## 2016-04-12 ENCOUNTER — Ambulatory Visit: Payer: Self-pay

## 2016-04-12 ENCOUNTER — Ambulatory Visit (HOSPITAL_COMMUNITY)
Admission: RE | Admit: 2016-04-12 | Discharge: 2016-04-12 | Disposition: A | Discharge status: Home / Self Care | Payer: Medicare Other | Source: Ambulatory Visit | Attending: Hematology and Oncology | Admitting: Hematology and Oncology

## 2016-04-12 ENCOUNTER — Other Ambulatory Visit: Payer: Self-pay | Admitting: Hematology and Oncology

## 2016-04-12 ENCOUNTER — Ambulatory Visit: Payer: Self-pay | Admitting: Hematology and Oncology

## 2016-04-12 ENCOUNTER — Encounter (HOSPITAL_COMMUNITY): Payer: Self-pay | Admitting: Emergency Medicine

## 2016-04-12 DIAGNOSIS — I252 Old myocardial infarction: Secondary | ICD-10-CM | POA: Insufficient documentation

## 2016-04-12 DIAGNOSIS — S2241XA Multiple fractures of ribs, right side, initial encounter for closed fracture: Secondary | ICD-10-CM | POA: Diagnosis not present

## 2016-04-12 DIAGNOSIS — Y9389 Activity, other specified: Secondary | ICD-10-CM | POA: Insufficient documentation

## 2016-04-12 DIAGNOSIS — C01 Malignant neoplasm of base of tongue: Secondary | ICD-10-CM

## 2016-04-12 DIAGNOSIS — Z8546 Personal history of malignant neoplasm of prostate: Secondary | ICD-10-CM | POA: Diagnosis not present

## 2016-04-12 DIAGNOSIS — Z85828 Personal history of other malignant neoplasm of skin: Secondary | ICD-10-CM | POA: Diagnosis not present

## 2016-04-12 DIAGNOSIS — I2510 Atherosclerotic heart disease of native coronary artery without angina pectoris: Secondary | ICD-10-CM | POA: Insufficient documentation

## 2016-04-12 DIAGNOSIS — Z955 Presence of coronary angioplasty implant and graft: Secondary | ICD-10-CM | POA: Insufficient documentation

## 2016-04-12 DIAGNOSIS — Z794 Long term (current) use of insulin: Secondary | ICD-10-CM | POA: Insufficient documentation

## 2016-04-12 DIAGNOSIS — Z7982 Long term (current) use of aspirin: Secondary | ICD-10-CM | POA: Insufficient documentation

## 2016-04-12 DIAGNOSIS — Z8589 Personal history of malignant neoplasm of other organs and systems: Secondary | ICD-10-CM | POA: Insufficient documentation

## 2016-04-12 DIAGNOSIS — E1140 Type 2 diabetes mellitus with diabetic neuropathy, unspecified: Secondary | ICD-10-CM | POA: Insufficient documentation

## 2016-04-12 DIAGNOSIS — S299XXA Unspecified injury of thorax, initial encounter: Secondary | ICD-10-CM | POA: Diagnosis present

## 2016-04-12 DIAGNOSIS — I700 Atherosclerosis of aorta: Secondary | ICD-10-CM

## 2016-04-12 DIAGNOSIS — Z85810 Personal history of malignant neoplasm of tongue: Secondary | ICD-10-CM | POA: Diagnosis not present

## 2016-04-12 DIAGNOSIS — Y999 Unspecified external cause status: Secondary | ICD-10-CM | POA: Insufficient documentation

## 2016-04-12 DIAGNOSIS — X58XXXA Exposure to other specified factors, initial encounter: Secondary | ICD-10-CM | POA: Insufficient documentation

## 2016-04-12 DIAGNOSIS — I10 Essential (primary) hypertension: Secondary | ICD-10-CM | POA: Insufficient documentation

## 2016-04-12 DIAGNOSIS — J90 Pleural effusion, not elsewhere classified: Secondary | ICD-10-CM | POA: Insufficient documentation

## 2016-04-12 DIAGNOSIS — Y929 Unspecified place or not applicable: Secondary | ICD-10-CM | POA: Diagnosis not present

## 2016-04-12 LAB — I-STAT CHEM 8, ED
BUN: 20 mg/dL (ref 6–20)
CALCIUM ION: 1.2 mmol/L (ref 1.15–1.40)
CREATININE: 0.8 mg/dL (ref 0.61–1.24)
Chloride: 99 mmol/L — ABNORMAL LOW (ref 101–111)
GLUCOSE: 172 mg/dL — AB (ref 65–99)
HCT: 47 % (ref 39.0–52.0)
Hemoglobin: 16 g/dL (ref 13.0–17.0)
Potassium: 4.2 mmol/L (ref 3.5–5.1)
Sodium: 138 mmol/L (ref 135–145)
TCO2: 28 mmol/L (ref 0–100)

## 2016-04-12 MED ORDER — IOPAMIDOL (ISOVUE-300) INJECTION 61%
INTRAVENOUS | Status: AC
  Filled 2016-04-12: qty 75

## 2016-04-12 MED ORDER — OXYCODONE-ACETAMINOPHEN 5-325 MG PO TABS: 1.0000 | ORAL_TABLET | ORAL | 0 refills | Status: DC | PRN

## 2016-04-12 MED ORDER — OXYCODONE-ACETAMINOPHEN 5-325 MG PO TABS
2.0000 | ORAL_TABLET | Freq: Once | ORAL | Status: AC
Start: 2016-04-12 — End: 2016-04-12
  Administered 2016-04-12: 2 via ORAL
  Filled 2016-04-12: qty 2

## 2016-04-12 MED ORDER — IOPAMIDOL (ISOVUE-300) INJECTION 61%
75.0000 mL | Freq: Once | INTRAVENOUS | Status: AC | PRN
  Administered 2016-04-12: 75 mL via INTRAVENOUS

## 2016-04-12 MED ORDER — AZITHROMYCIN 250 MG PO TABS: 250.0000 mg | ORAL_TABLET | Freq: Every day | ORAL | 0 refills | Status: DC

## 2016-04-12 MED ORDER — HYDROMORPHONE HCL 1 MG/ML IJ SOLN
1.0000 mg | Freq: Once | INTRAMUSCULAR | Status: AC
Start: 2016-04-12 — End: 2016-04-12
  Administered 2016-04-12: 1 mg via INTRAMUSCULAR
  Filled 2016-04-12: qty 1

## 2016-04-12 NOTE — Telephone Encounter (Signed)
Patient wife called and stated while leaving radiology today he began to cough, feeling shaky, and worsening chest pain. Instructed patient to go to the emergency room. Tammi RN informed

## 2016-04-12 NOTE — Telephone Encounter (Signed)
Patient informed. 

## 2016-04-12 NOTE — ED Provider Notes (Signed)
[facility] DEPT Provider Note   CSN: [id number] Arrival date & time: 04/12/16  1005     History   Chief Complaint Chief Complaint  Patient presents with  . chest pain post chest compressions    HPI Benjamin Barr is a 71 y.o. male.  HPI Patient presents the emergency department ongoing chest discomfort after receiving chest compressions during a cardiac arrest last week.  This was 7 days ago.  This occurred secondary to anaphylaxis while receiving chemotherapy.  He was observed in the hospital overnight.  He's had ongoing discomfort since the event and new productive cough. No fevers. Worse with breathing and palpation of the right anterior chest. Pain is moderate to severe in severity   Past Medical History:  Diagnosis Date  . Anemia   . CAD (coronary artery disease)    a. 2008 PCI: Taxus DES  to mRCA, prox & mid LCFX;  b. 10/2014 MV: extensive ischemia in the RCA territory and small area of anterior ischemia;  c. 11/2014 PCI: LM nl, LAD 30ost/prox, 80d, LCX 90ost (3.0x12 Promus Prem DES), OM2 30, RCA 33m (3.0x12 Promus Prem DES), 97m, RPDA 90 (PTCA), EF 55-65%.  . Cancer ([place])    Head & neck ca  . Diverticulosis of colon   . Heart murmur   . History of colon polyps    2014--  hyperplastic, adenoma, and benign polyps  . History of radiation therapy 10/01/15- 11/20/15   Oropharynx/base of neck 70 Gy in 35 fractions  . Hyperlipidemia   . Hypertension   . Myocardial infarction   . Nonproliferative diabetic retinopathy ([place])   . Obesity   . Peripheral neuropathy ([place])   . Pneumonia    hx  . Prostate cancer ([place])   . RBBB (right bundle branch block)   . Skin cancer   . Type 2 diabetes mellitus [facility])     Patient Active Problem List   Diagnosis Date Noted  . Atrial fibrillation with RVR ([place])   . New onset a-fib ([place]) 04/05/2016  . Anaphylactic reaction 04/05/2016  . Dysphagia 03/24/2016  . Goals of care, counseling/discussion 03/23/2016  . 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([place])  03/22/2016  . Acquired lymphedema 02/23/2016  . S/P gastrostomy ([place]) 11/17/2015  . Protein-calorie malnutrition, moderate ([place]) 11/13/2015  . Nausea without vomiting 10/28/2015  . Constipation, acute 10/28/2015  . 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 Weight loss 10/14/2015  . 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 Cancer of base of tongue ([place]) 09/22/2015  . History of prostate cancer 09/03/2015  . Tongue cancer ([place]) 09/03/2015  . Unstable angina ([place]) 11/14/2014  . Abnormal nuclear stress test 11/13/2014  . Angina pectoris ([place]) 11/13/2014  . PMR (polymyalgia rheumatica) ([facility]) 11/02/2014  . Chest pain with moderate risk for cardiac etiology 10/31/2014  . Diabetes mellitus with diabetic neuropathy, with long-term current use of insulin ([place]) 12/28/2010  . CAD S/P percutaneous coronary angioplasty   . Hypertension   . Hyperlipidemia   . Obesity     Past Surgical History:  Procedure Laterality Date  . APPENDECTOMY    . CARDIAC CATHETERIZATION N/A 11/13/2014   Procedure: Left Heart Cath and Coronary Angiography;  Surgeon: [name] [name], MD;  Location: [facility] [facility] LAB;  Service: Cardiovascular;  Laterality: N/A;  . CARDIAC CATHETERIZATION  11/13/2014   Procedure: Coronary Stent Intervention;  Surgeon: [name] [name], MD;  Location: [facility] LAB;  Service: Cardiovascular;;  . CARDIOVASCULAR STRESS TEST  01-17-2012  dr [name]   Normal perfusion study/ no ischemia or infarction/  normal LVF and wall motion, ef 54%  . CORONARY ANGIOPLASTY WITH STENT PLACEMENT  05/04/2006   dr [name]   Severe 2-vessel obstructive CAD/  normal LVF/  PCI with DES to proxima and mid CFX and mRCA (total 3 DES)  . CRYOABLATION N/A 04/01/2014   Procedure: CRYO ABLATION PROSTATE;  Surgeon: [name], MD;  Location: [facility];  Service: Urology;  Laterality: N/A;  . DIRECT LARYNGOSCOPY N/A 09/19/2015   Procedure: DIRECT LARYNGOSCOPY;  Surgeon:  Izora Gala, MD;  Location: [facility];  Service: ENT;  Laterality: N/A;  . ESOPHAGOSCOPY N/A 09/19/2015   Procedure: ESOPHAGOSCOPY;  Surgeon: Izora Gala, MD;  Location: [facility];  Service: ENT;  Laterality: N/A;  . FLOOR OF MOUTH BIOPSY N/A 09/19/2015   Procedure: FLOOR OF MOUTH BIOPSY;  Surgeon: Izora Gala, MD;  Location: [facility] OR;  Service: ENT;  Laterality: N/A;  . INSERTION OF SUPRAPUBIC CATHETER N/A 04/01/2014   Procedure: INSERTION OF SUPRAPUBIC CATHETER;  Surgeon: [name], MD;  Location: [facility];  Service: Urology;  Laterality: N/A;  suprapubic   . IR GENERIC HISTORICAL  11/13/2015   IR GASTROSTOMY TUBE MOD SED 11/13/2015 [name], DO [facility]  . IR GENERIC HISTORICAL  03/26/2016   IR US GUIDE VASC ACCESS RIGHT 03/26/2016 [name], MD [facility]  . IR GENERIC HISTORICAL  03/26/2016   IR FLUORO GUIDE PORT INSERTION RIGHT 03/26/2016 [name], MD [facility]  . MULTIPLE TOOTH EXTRACTIONS  09/17/2015  . PARTIAL COLECTOMY  80's       Home Medications    Prior to Admission medications   Medication Sig Start Date End Date Taking? Authorizing Provider  aspirin EC 81 MG tablet Take 81 mg by mouth at bedtime.    Yes Historical Provider, MD  clopidogrel (PLAVIX) 75 MG tablet TAKE 1 TABLET EVERY DAY Patient taking differently: TAKE 1 TABLET AT BEDTIME 11/27/15  Yes [name] [name], MD  fluticasone [facility]) 50 MCG/ACT nasal spray Place 2 sprays into both nostrils at bedtime as needed for rhinitis.    Yes Historical Provider, MD  insulin glargine (LANTUS) 100 UNIT/ML injection Inject 100 Units into the skin at bedtime.    Yes Historical Provider, MD  ketoconazole (NIZORAL) 2 % shampoo Apply 1 application topically every 3 (three) days.   Yes Historical Provider, MD  lidocaine-prilocaine (EMLA) cream Apply 1 application topically once as needed (prior to accessing port).   Yes Historical Provider, MD  metoprolol succinate (TOPROL-XL) 25 MG 24 hr tablet  Take 0.5 tablets (12.5 mg total) by mouth daily. 04/06/16  Yes Patrecia Pour, MD  mirtazapine (REMERON) 15 MG tablet Take 1 tablet (15 mg total) by mouth at bedtime. 03/30/16  Yes Heath Lark, MD  Nutritional Supplements (FEEDING SUPPLEMENT, OSMOLITE 1.5 CAL,) LIQD Give 1 1/2 cans osmolite 1.5 via PEG QID with 60 cc free water before and after feedings.  In addition, flush tube with 240 cc free water TID between meals. 11/19/15  Yes Eppie Gibson, MD  ondansetron (ZOFRAN) 8 MG tablet Take 1 tablet (8 mg total) by mouth every 8 (eight) hours as needed for nausea. 01/09/16  Yes Holley Bouche, NP  prochlorperazine (COMPAZINE) 10 MG tablet Take 1 tablet (10 mg total) by mouth every 6 (six) hours as needed. Patient taking differently: Take 10 mg by mouth every 6 (six) hours as needed for nausea or vomiting.  01/09/16  Yes Holley Bouche, NP  silver sulfADIAZINE (SILVADENE) 1 % cream Apply 1 application topically at bedtime. Pt applies to sacral ulceration area.   Yes Historical Provider, MD  sodium fluoride (FLUORISHIELD) 1.1 % GEL dental gel Instill one drop of gel per tooth space of fluoride tray. Place over teeth for 5 minutes. Remove. Spit out excess. Repeat nightly. 09/22/15  Yes Lenn Cal, DDS  oxyCODONE-acetaminophen (PERCOCET/ROXICET) 5-325 MG tablet Take 1 tablet by mouth every 4 (four) hours as needed for severe pain. 04/12/16   Jola Schmidt, MD    Family History Family History  Problem Relation Age of Onset  . Diverticulitis Mother   . Heart failure Father   . Cancer Father     prostate ca    Social History Social History  Substance Use Topics  . Smoking status: Never Smoker  . Smokeless tobacco: Never Used  . Alcohol use Yes     Comment: occ beer     Allergies   Cetuximab   Review of Systems Review of Systems  All other systems reviewed and are negative.    Physical Exam Updated Vital Signs BP 144/79 (BP Location: Left Arm)   Pulse 87   Temp 97.8 F (36.6 C)  (Oral)   Resp 16   Wt 179 lb (81.2 kg)   SpO2 97%   BMI 27.22 kg/m   Physical Exam  Constitutional: He is oriented to person, place, and time. He appears well-developed and well-nourished.  HENT:  Head: Normocephalic and atraumatic.  Eyes: EOM are normal.  Neck: Normal range of motion.  Cardiovascular: Normal rate, regular rhythm and normal heart sounds.   Pulmonary/Chest: Effort normal and breath sounds normal. No respiratory distress.  Anterior right chest tenderness without crepitus.  No bruising.  Abdominal: Soft. He exhibits no distension. There is no tenderness.  Musculoskeletal: Normal range of motion.  Neurological: He is alert and oriented to person, place, and time.  Skin: Skin is warm and dry.  Psychiatric: He has a normal mood and affect. Judgment normal.  Nursing note and vitals reviewed.    ED Treatments / Results  Labs (all labs ordered are listed, but only abnormal results are displayed) Labs Reviewed  I-STAT CHEM 8, ED - Abnormal; Notable for the following:       Result Value   Chloride 99 (*)    Glucose, Bld 172 (*)    All other components within normal limits    EKG  EKG Interpretation None       Radiology Dg Chest 2 View  Result Date: 04/12/2016 CLINICAL DATA:  Cough, chest congestion, atypical chest pain, the patient underwent recent CPR. Suspect rib fractures. History of head and neck malignancy and hepatic malignancy, diabetes, hypertension, coronary artery disease with stent placement. EXAM: CHEST  2 VIEW COMPARISON:  PA and lateral chest x-ray of May 22, 2015 and PET-CT study of March 11, 2016. FINDINGS: The lungs are well-expanded. There is a small right pleural effusion. There is no alveolar infiltrate or pleural effusion. No pulmonary parenchymal nodules or masses are observed. The heart and pulmonary vascularity are normal. The power port catheter tip projects over the midportion of the SVC. There is calcification in the wall of the aortic  arch. The trachea is midline. The bony thorax exhibits no acute abnormality. IMPRESSION: New small right pleural effusion. No pulmonary parenchymal masses or nodules. No pneumonia nor CHF. Thoracic aortic atherosclerosis. Electronically Signed   By: David  [name] M.D.  On: 04/12/2016 10:07   Ct Chest W Contrast  Result Date: 04/12/2016 CLINICAL DATA:  Chest pain and cough following spur recent CPR EXAM: CT CHEST WITH CONTRAST TECHNIQUE: Multidetector CT imaging of the chest was performed during intravenous contrast administration. CONTRAST:  16mL ISOVUE-300 IOPAMIDOL (ISOVUE-300) INJECTION 61% COMPARISON:  Chest x-ray from earlier in the same day, PET-CT from 03/11/2016 FINDINGS: Cardiovascular: Thoracic aorta demonstrates atherosclerotic calcifications without aneurysmal dilatation or dissection. Heavy coronary calcifications are noted throughout the coronary system. No significant cardiac enlargement is seen. No definitive pulmonary embolism is seen. Right chest wall port is noted. Mediastinum/Nodes: The thoracic inlet is within normal limits. No significant mediastinal or hilar adenopathy is seen. Lungs/Pleura: The lungs are well aerated bilaterally. A new right-sided pleural effusion is seen. There is a nodular density in the left posterior costophrenic angle which is new from recent exams. This measures 6-7 mm and is best seen on image number 93 of series 5. Patchy changes remain in the upper lobes bilaterally again likely related to radiation pneumonitis. Mild left basilar atelectasis is seen related to the pleural effusion. Upper Abdomen: Right renal cyst is noted. There is an irregularly enhancing lesion within the caudate lobe of the liver again suspicious for metastatic disease. Musculoskeletal: Degenerative change of the thoracic spine is noted. Fractures of the right second third and fourth ribs are noted. This is likely related to the recent CPR and consistent with the patient's given clinical  history of right chest pain. No pneumothorax is noted IMPRESSION: Right rib fractures with associated right pleural effusion this is likely the etiology of the patient's discomfort. No pneumothorax is noted. Caudate lobe lesion consistent with metastatic disease. Vague 6-7 mm nodule in the posterior left costophrenic angle. Metastatic disease would be difficult to exclude although this was not well seen on a prior exam from 1 month ago. Short-term follow-up in 3 months is recommended. Electronically Signed   By: Inez Catalina M.D.   On: 04/12/2016 14:04    Procedures Procedures (including critical care time)  Medications Ordered in ED Medications  iopamidol (ISOVUE-300) 61 % injection (not administered)  oxyCODONE-acetaminophen (PERCOCET/ROXICET) 5-325 MG per tablet 2 tablet (2 tablets Oral Given 04/12/16 1200)  iopamidol (ISOVUE-300) 61 % injection 75 mL (75 mLs Intravenous Contrast Given 04/12/16 1331)  HYDROmorphone (DILAUDID) injection 1 mg (1 mg Intramuscular Given 04/12/16 1424)     Initial Impression / Assessment and Plan / ED Course  I have reviewed the triage vital signs and the nursing notes.  Pertinent labs & imaging results that were available during my care of the patient were reviewed by me and considered in my medical decision making (see chart for details).     3 right-sided rib fractures.  Discharge home in good condition.  Pain improved.  Home with incentive spirometry.  He has had increasing productive cough with a cover with azithromycin.  Primary care follow-up.  He understands to return to the ER for new or worsening symptoms  Final Clinical Impressions(s) / ED Diagnoses   Final diagnoses:  Closed fracture of multiple ribs of right side, initial encounter    New Prescriptions New Prescriptions   OXYCODONE-ACETAMINOPHEN (PERCOCET/ROXICET) 5-325 MG TABLET    Take 1 tablet by mouth every 4 (four) hours as needed for severe pain.     Jola Schmidt, MD 04/12/16 [phone]

## 2016-04-12 NOTE — Telephone Encounter (Signed)
1) I have cancelled all his appt today. We can plan to start chemo next Monday as scheduled 2) His symptoms are likely due to traumatic CPR. CXR is not needed but if he still has concerns, he can go to [facility] CXR department. I have order on standing; he does not need appt for that

## 2016-04-12 NOTE — ED Notes (Signed)
Bed: [id number] Expected date: 04/12/16 Expected time:  Means of arrival:  Comments:

## 2016-04-12 NOTE — ED Notes (Signed)
Bed: WA08 Expected date:  Expected time:  Means of arrival:  Comments: 

## 2016-04-12 NOTE — ED Triage Notes (Signed)
Patient states that week ago he was having chemo when had allergic reaACTION AND died but was revived with CPR. Patient c/o chest pain and cough since having CPR performed on him.  Patient was at radiology for chest xray and was shaky so nurse line at cancer center told him to come to ED.

## 2016-04-12 NOTE — Telephone Encounter (Addendum)
Oncology Nurse Navigator Documentation  Received call from Benjamin Barr.    Wife's mother died Jun 06, 2022 night, he will not be able to keep today's chemo appt.  He expressed concern that he continues to experience painful, deep coughing since receiving CPR.  He asks if he should have a chest Xray. Dr. Alvy Bimler informed.  [name], RN, BSN, [place] Oncology Nurse [place] [facility] [place] [phone]

## 2016-04-13 ENCOUNTER — Encounter: Payer: Self-pay | Admitting: Physical Therapy

## 2016-04-14 ENCOUNTER — Telehealth: Payer: Self-pay | Admitting: *Deleted

## 2016-04-14 NOTE — Telephone Encounter (Signed)
States medicine he got in ED is not working. Still a lot of pain. Chest pain with cough. Has 3 cracked ribs.  (Z pack and oxycodone)  Has fentanyl 12 and 25 mg patches left from before. Also has hydrocodone 7.5/325.  Very painful, cannot sleep. Has concerns about coming for treatment while feels so bad and coughing.

## 2016-04-14 NOTE — Telephone Encounter (Signed)
He can start fentanyl 25 mcg back & leftover hydrocodone The fentanyl should kick in within 6 hours Please call back tomorrow for report

## 2016-04-14 NOTE — Telephone Encounter (Signed)
Notified of message below. Verbalized understanding 

## 2016-04-15 ENCOUNTER — Other Ambulatory Visit: Payer: Self-pay | Admitting: Hematology and Oncology

## 2016-04-15 ENCOUNTER — Telehealth: Payer: Self-pay | Admitting: *Deleted

## 2016-04-15 ENCOUNTER — Encounter: Payer: Self-pay | Admitting: Physical Therapy

## 2016-04-15 DIAGNOSIS — C01 Malignant neoplasm of base of tongue: Secondary | ICD-10-CM

## 2016-04-15 MED ORDER — MORPHINE SULFATE 15 MG PO TABS: 15.0000 mg | ORAL_TABLET | ORAL | 0 refills | Status: DC | PRN

## 2016-04-15 NOTE — Telephone Encounter (Signed)
Fentanyl 25 mg placed yesterday. Was using hydrocodone 7.5 for pain and has run out. Has 9 tablets left of percocet 5-325,  Would like refill of either pain pill. Has 3 fentanyl patches left.  Sees Dr Alvy Bimler on Monday, 3/12

## 2016-04-19 ENCOUNTER — Ambulatory Visit (HOSPITAL_BASED_OUTPATIENT_CLINIC_OR_DEPARTMENT_OTHER): Payer: Medicare Other

## 2016-04-19 ENCOUNTER — Other Ambulatory Visit (HOSPITAL_COMMUNITY)
Admission: RE | Admit: 2016-04-19 | Discharge: 2016-04-19 | Disposition: A | Discharge status: Home / Self Care | Payer: Medicare Other | Source: Ambulatory Visit | Attending: Hematology and Oncology | Admitting: Hematology and Oncology

## 2016-04-19 ENCOUNTER — Other Ambulatory Visit (HOSPITAL_BASED_OUTPATIENT_CLINIC_OR_DEPARTMENT_OTHER): Payer: Medicare Other

## 2016-04-19 ENCOUNTER — Encounter: Payer: Self-pay | Admitting: General Practice

## 2016-04-19 ENCOUNTER — Encounter: Payer: Self-pay | Admitting: Hematology and Oncology

## 2016-04-19 ENCOUNTER — Telehealth: Payer: Self-pay | Admitting: Hematology and Oncology

## 2016-04-19 ENCOUNTER — Ambulatory Visit: Payer: Medicare Other | Admitting: Nutrition

## 2016-04-19 ENCOUNTER — Ambulatory Visit (HOSPITAL_BASED_OUTPATIENT_CLINIC_OR_DEPARTMENT_OTHER): Payer: Medicare Other | Admitting: Hematology and Oncology

## 2016-04-19 DIAGNOSIS — E440 Moderate protein-calorie malnutrition: Secondary | ICD-10-CM

## 2016-04-19 DIAGNOSIS — C029 Malignant neoplasm of tongue, unspecified: Secondary | ICD-10-CM

## 2016-04-19 DIAGNOSIS — C01 Malignant neoplasm of base of tongue: Secondary | ICD-10-CM

## 2016-04-19 DIAGNOSIS — Z452 Encounter for adjustment and management of vascular access device: Secondary | ICD-10-CM

## 2016-04-19 DIAGNOSIS — R0789 Other chest pain: Secondary | ICD-10-CM | POA: Diagnosis not present

## 2016-04-19 DIAGNOSIS — Z5111 Encounter for antineoplastic chemotherapy: Secondary | ICD-10-CM

## 2016-04-19 DIAGNOSIS — C787 Secondary malignant neoplasm of liver and intrahepatic bile duct: Secondary | ICD-10-CM

## 2016-04-19 DIAGNOSIS — C770 Secondary and unspecified malignant neoplasm of lymph nodes of head, face and neck: Secondary | ICD-10-CM

## 2016-04-19 DIAGNOSIS — R110 Nausea: Secondary | ICD-10-CM

## 2016-04-19 LAB — CBC WITH DIFFERENTIAL/PLATELET
BASO%: 0.3 % (ref 0.0–2.0)
Basophils Absolute: 0.1 10*3/uL (ref 0.0–0.1)
EOS%: 1.7 % (ref 0.0–7.0)
Eosinophils Absolute: 0.3 10*3/uL (ref 0.0–0.5)
HEMATOCRIT: 37.9 % — AB (ref 38.4–49.9)
HEMOGLOBIN: 12.5 g/dL — AB (ref 13.0–17.1)
LYMPH#: 0.3 10*3/uL — AB (ref 0.9–3.3)
LYMPH%: 2.1 % — ABNORMAL LOW (ref 14.0–49.0)
MCH: 28.3 pg (ref 27.2–33.4)
MCHC: 33.1 g/dL (ref 32.0–36.0)
MCV: 85.5 fL (ref 79.3–98.0)
MONO#: 1 10*3/uL — ABNORMAL HIGH (ref 0.1–0.9)
MONO%: 5.8 % (ref 0.0–14.0)
NEUT#: 14.8 10*3/uL — ABNORMAL HIGH (ref 1.5–6.5)
NEUT%: 90.1 % — ABNORMAL HIGH (ref 39.0–75.0)
Platelets: 240 10*3/uL (ref 140–400)
RBC: 4.43 10*6/uL (ref 4.20–5.82)
RDW: 13.6 % (ref 11.0–14.6)
WBC: 16.5 10*3/uL — ABNORMAL HIGH (ref 4.0–10.3)

## 2016-04-19 LAB — MAGNESIUM: MAGNESIUM: 1.8 mg/dL (ref 1.5–2.5)

## 2016-04-19 LAB — COMPREHENSIVE METABOLIC PANEL
ALT: 13 U/L (ref 0–55)
ANION GAP: 8 meq/L (ref 3–11)
AST: 20 U/L (ref 5–34)
Albumin: 2.7 g/dL — ABNORMAL LOW (ref 3.5–5.0)
Alkaline Phosphatase: 138 U/L (ref 40–150)
BILIRUBIN TOTAL: 0.57 mg/dL (ref 0.20–1.20)
BUN: 20 mg/dL (ref 7.0–26.0)
CHLORIDE: 102 meq/L (ref 98–109)
CO2: 28 mEq/L (ref 22–29)
Calcium: 9.8 mg/dL (ref 8.4–10.4)
Creatinine: 1 mg/dL (ref 0.7–1.3)
EGFR: 78 mL/min/{1.73_m2} — AB (ref >90)
Glucose: 249 mg/dl — ABNORMAL HIGH (ref 70–140)
POTASSIUM: 4.4 meq/L (ref 3.5–5.1)
Sodium: 138 mEq/L (ref 136–145)
TOTAL PROTEIN: 6.6 g/dL (ref 6.4–8.3)

## 2016-04-19 LAB — PHOSPHORUS: PHOSPHORUS: 3.1 mg/dL (ref 2.5–4.6)

## 2016-04-19 MED ORDER — FENTANYL 25 MCG/HR TD PT72: 25.0000 ug | MEDICATED_PATCH | TRANSDERMAL | 0 refills | Status: DC

## 2016-04-19 MED ORDER — SODIUM CHLORIDE 0.9 % IV SOLN
1005.0000 mg/m2/d | INTRAVENOUS | Status: DC
  Administered 2016-04-19: 8000 mg via INTRAVENOUS
  Filled 2016-04-19: qty 160

## 2016-04-19 MED ORDER — PALONOSETRON HCL INJECTION 0.25 MG/5ML
INTRAVENOUS | Status: AC
Start: 2016-04-19 — End: 2016-04-19
  Filled 2016-04-19: qty 5

## 2016-04-19 MED ORDER — PALONOSETRON HCL INJECTION 0.25 MG/5ML
0.2500 mg | Freq: Once | INTRAVENOUS | Status: AC
  Administered 2016-04-19: 0.25 mg via INTRAVENOUS

## 2016-04-19 MED ORDER — SODIUM CHLORIDE 0.9 % IV SOLN
Freq: Once | INTRAVENOUS | Status: AC
  Administered 2016-04-19: 13:00:00 via INTRAVENOUS

## 2016-04-19 MED ORDER — DEXAMETHASONE SODIUM PHOSPHATE 10 MG/ML IJ SOLN
INTRAMUSCULAR | Status: AC
  Filled 2016-04-19: qty 1

## 2016-04-19 MED ORDER — SODIUM CHLORIDE 0.9% FLUSH
10.0000 mL | Freq: Once | INTRAVENOUS | Status: AC
  Administered 2016-04-19: 10 mL
  Filled 2016-04-19: qty 10

## 2016-04-19 MED ORDER — CARBOPLATIN CHEMO INJECTION 600 MG/60ML
513.5000 mg | Freq: Once | INTRAVENOUS | Status: AC
  Administered 2016-04-19: 510 mg via INTRAVENOUS
  Filled 2016-04-19: qty 51

## 2016-04-19 MED ORDER — DEXAMETHASONE SODIUM PHOSPHATE 10 MG/ML IJ SOLN
10.0000 mg | Freq: Once | INTRAMUSCULAR | Status: AC
  Administered 2016-04-19: 10 mg via INTRAVENOUS

## 2016-04-19 MED ORDER — PEGFILGRASTIM INJECTION 6 MG/0.6ML ~~LOC~~: 6.0000 mg | PREFILLED_SYRINGE | Freq: Once | SUBCUTANEOUS | Status: DC

## 2016-04-19 NOTE — Telephone Encounter (Signed)
Gave patient AVS and calender per 04/19/2016 los

## 2016-04-19 NOTE — Progress Notes (Signed)
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 Weight improved documented as 184.6 pounds March 12 increased from 179 pounds March 5. Noted labs include glucose 249, albumin 2.7. Patient reports tolerating 6 cans of Osmolite 1.5 without difficulty. He has not been eating by mouth. He denies nutrition impact symptoms.  Nutrition diagnosis: Inadequate oral intake continues.  Intervention: Educated patient to continue adequate tube feeding to meet greater than 90% of estimated nutrition needs. Recommended patient begin oral intake as tolerated. Questions were answered.  Teach back method used.  Monitoring, evaluation, goals: Patient will tolerate chemotherapy with minimal side effects and weight maintenance.  Next visit: Monday, April 2, during infusion.  **Disclaimer: This note was dictated with voice recognition software. Similar sounding words can inadvertently be transcribed and this note may contain transcription errors which may not have been corrected upon publication of note.**

## 2016-04-19 NOTE — Assessment & Plan Note (Signed)
He is able to tolerate nutritional supplement better

## 2016-04-19 NOTE — Assessment & Plan Note (Signed)
He has isolated liver metastasis. Potentially, it could be resectable in the future if he has excellent response to chemotherapy. His liver function tests is preserved

## 2016-04-19 NOTE — Patient Instructions (Signed)
[facility] Discharge Instructions for Patients Receiving Chemotherapy  Today you received the following chemotherapy agents:  Carboplatin (paraplatin),  Fluorouracil (adrucil)  To help prevent nausea and vomiting after your treatment, we encourage you to take your nausea medication as prescribed.   If you develop nausea and vomiting that is not controlled by your nausea medication, call the clinic.   BELOW ARE SYMPTOMS THAT SHOULD BE REPORTED IMMEDIATELY:  *FEVER GREATER THAN 100.5 F  *CHILLS WITH OR WITHOUT FEVER  NAUSEA AND VOMITING THAT IS NOT CONTROLLED WITH YOUR NAUSEA MEDICATION  *UNUSUAL SHORTNESS OF BREATH  *UNUSUAL BRUISING OR BLEEDING  TENDERNESS IN MOUTH AND THROAT WITH OR WITHOUT PRESENCE OF ULCERS  *URINARY PROBLEMS  *BOWEL PROBLEMS  UNUSUAL RASH Items with * indicate a potential emergency and should be followed up as soon as possible.  Feel free to call the clinic you have any questions or concerns. The clinic phone number is [phone].  Please show the [facility] at check-in to the Emergency Department and triage nurse.     Fluorouracil, 5-FU injection What is this medicine? FLUOROURACIL, 5-FU (flure oh YOOR a sil) is a chemotherapy drug. It slows the growth of cancer cells. This medicine is used to treat many types of cancer like breast cancer, colon or rectal cancer, pancreatic cancer, and stomach cancer. This medicine may be used for other purposes; ask your health care provider or pharmacist if you have questions. COMMON BRAND NAME(S): Adrucil What should I tell my health care provider before I take this medicine? They need to know if you have any of these conditions: -blood disorders -dihydropyrimidine dehydrogenase (DPD) deficiency -infection (especially a virus infection such as chickenpox, cold sores, or herpes) -kidney disease -liver disease -malnourished, poor nutrition -recent or ongoing radiation therapy -an  unusual or allergic reaction to fluorouracil, other chemotherapy, other medicines, foods, dyes, or preservatives -pregnant or trying to get pregnant -breast-feeding How should I use this medicine? This drug is given as an infusion or injection into a vein. It is administered in a hospital or clinic by a specially trained health care professional. Talk to your pediatrician regarding the use of this medicine in children. Special care may be needed. Overdosage: If you think you have taken too much of this medicine contact a poison control center or emergency room at once. NOTE: This medicine is only for you. Do not share this medicine with others. What if I miss a dose? It is important not to miss your dose. Call your doctor or health care professional if you are unable to keep an appointment. What may interact with this medicine? -allopurinol -cimetidine -dapsone -digoxin -hydroxyurea -leucovorin -levamisole -medicines for seizures like ethotoin, fosphenytoin, phenytoin -medicines to increase blood counts like filgrastim, pegfilgrastim, sargramostim -medicines that treat or prevent blood clots like warfarin, enoxaparin, and dalteparin -methotrexate -metronidazole -pyrimethamine -some other chemotherapy drugs like busulfan, cisplatin, estramustine, vinblastine -trimethoprim -trimetrexate -vaccines Talk to your doctor or health care professional before taking any of these medicines: -acetaminophen -aspirin -ibuprofen -ketoprofen -naproxen This list may not describe all possible interactions. Give your health care provider a list of all the medicines, herbs, non-prescription drugs, or dietary supplements you use. Also tell them if you smoke, drink alcohol, or use illegal drugs. Some items may interact with your medicine. What should I watch for while using this medicine? Visit your doctor for checks on your progress. This drug may make you feel generally unwell. This is not uncommon,  as chemotherapy can  affect healthy cells as well as cancer cells. Report any side effects. Continue your course of treatment even though you feel ill unless your doctor tells you to stop. In some cases, you may be given additional medicines to help with side effects. Follow all directions for their use. Call your doctor or health care professional for advice if you get a fever, chills or sore throat, or other symptoms of a cold or flu. Do not treat yourself. This drug decreases your body's ability to fight infections. Try to avoid being around people who are sick. This medicine may increase your risk to bruise or bleed. Call your doctor or health care professional if you notice any unusual bleeding. Be careful brushing and flossing your teeth or using a toothpick because you may get an infection or bleed more easily. If you have any dental work done, tell your dentist you are receiving this medicine. Avoid taking products that contain aspirin, acetaminophen, ibuprofen, naproxen, or ketoprofen unless instructed by your doctor. These medicines may hide a fever. Do not become pregnant while taking this medicine. Women should inform their doctor if they wish to become pregnant or think they might be pregnant. There is a potential for serious side effects to an unborn child. Talk to your health care professional or pharmacist for more information. Do not breast-feed an infant while taking this medicine. Men should inform their doctor if they wish to father a child. This medicine may lower sperm counts. Do not treat diarrhea with over the counter products. Contact your doctor if you have diarrhea that lasts more than 2 days or if it is severe and watery. This medicine can make you more sensitive to the sun. Keep out of the sun. If you cannot avoid being in the sun, wear protective clothing and use sunscreen. Do not use sun lamps or tanning beds/booths. What side effects may I notice from receiving this  medicine? Side effects that you should report to your doctor or health care professional as soon as possible: -allergic reactions like skin rash, itching or hives, swelling of the face, lips, or tongue -low blood counts - this medicine may decrease the number of white blood cells, red blood cells and platelets. You may be at increased risk for infections and bleeding. -signs of infection - fever or chills, cough, sore throat, pain or difficulty passing urine -signs of decreased platelets or bleeding - bruising, pinpoint red spots on the skin, black, tarry stools, blood in the urine -signs of decreased red blood cells - unusually weak or tired, fainting spells, lightheadedness -breathing problems -changes in vision -chest pain -mouth sores -nausea and vomiting -pain, swelling, redness at site where injected -pain, tingling, numbness in the hands or feet -redness, swelling, or sores on hands or feet -stomach pain -unusual bleeding Side effects that usually do not require medical attention (report to your doctor or health care professional if they continue or are bothersome): -changes in finger or toe nails -diarrhea -dry or itchy skin -hair loss -headache -loss of appetite -sensitivity of eyes to the light -stomach upset -unusually teary eyes This list may not describe all possible side effects. Call your doctor for medical advice about side effects. You may report side effects to FDA at 1-800-FDA-1088. Where should I keep my medicine? This drug is given in a hospital or clinic and will not be stored at home. NOTE: This sheet is a summary. It may not cover all possible information. If you have questions about this medicine,  talk to your doctor, pharmacist, or health care provider.  2018 Elsevier/Gold Standard (2007-05-31 13:53:16)    Carboplatin injection What is this medicine? CARBOPLATIN (KAR boe pla tin) is a chemotherapy drug. It targets fast dividing cells, like cancer cells,  and causes these cells to die. This medicine is used to treat ovarian cancer and many other cancers. This medicine may be used for other purposes; ask your health care provider or pharmacist if you have questions. COMMON BRAND NAME(S): Paraplatin What should I tell my health care provider before I take this medicine? They need to know if you have any of these conditions: -blood disorders -hearing problems -kidney disease -recent or ongoing radiation therapy -an unusual or allergic reaction to carboplatin, cisplatin, other chemotherapy, other medicines, foods, dyes, or preservatives -pregnant or trying to get pregnant -breast-feeding How should I use this medicine? This drug is usually given as an infusion into a vein. It is administered in a hospital or clinic by a specially trained health care professional. Talk to your pediatrician regarding the use of this medicine in children. Special care may be needed. Overdosage: If you think you have taken too much of this medicine contact a poison control center or emergency room at once. NOTE: This medicine is only for you. Do not share this medicine with others. What if I miss a dose? It is important not to miss a dose. Call your doctor or health care professional if you are unable to keep an appointment. What may interact with this medicine? -medicines for seizures -medicines to increase blood counts like filgrastim, pegfilgrastim, sargramostim -some antibiotics like amikacin, gentamicin, neomycin, streptomycin, tobramycin -vaccines Talk to your doctor or health care professional before taking any of these medicines: -acetaminophen -aspirin -ibuprofen -ketoprofen -naproxen This list may not describe all possible interactions. Give your health care provider a list of all the medicines, herbs, non-prescription drugs, or dietary supplements you use. Also tell them if you smoke, drink alcohol, or use illegal drugs. Some items may interact with  your medicine. What should I watch for while using this medicine? Your condition will be monitored carefully while you are receiving this medicine. You will need important blood work done while you are taking this medicine. This drug may make you feel generally unwell. This is not uncommon, as chemotherapy can affect healthy cells as well as cancer cells. Report any side effects. Continue your course of treatment even though you feel ill unless your doctor tells you to stop. In some cases, you may be given additional medicines to help with side effects. Follow all directions for their use. Call your doctor or health care professional for advice if you get a fever, chills or sore throat, or other symptoms of a cold or flu. Do not treat yourself. This drug decreases your body's ability to fight infections. Try to avoid being around people who are sick. This medicine may increase your risk to bruise or bleed. Call your doctor or health care professional if you notice any unusual bleeding. Be careful brushing and flossing your teeth or using a toothpick because you may get an infection or bleed more easily. If you have any dental work done, tell your dentist you are receiving this medicine. Avoid taking products that contain aspirin, acetaminophen, ibuprofen, naproxen, or ketoprofen unless instructed by your doctor. These medicines may hide a fever. Do not become pregnant while taking this medicine. Women should inform their doctor if they wish to become pregnant or think they might be pregnant.  There is a potential for serious side effects to an unborn child. Talk to your health care professional or pharmacist for more information. Do not breast-feed an infant while taking this medicine. What side effects may I notice from receiving this medicine? Side effects that you should report to your doctor or health care professional as soon as possible: -allergic reactions like skin rash, itching or hives, swelling  of the face, lips, or tongue -signs of infection - fever or chills, cough, sore throat, pain or difficulty passing urine -signs of decreased platelets or bleeding - bruising, pinpoint red spots on the skin, black, tarry stools, nosebleeds -signs of decreased red blood cells - unusually weak or tired, fainting spells, lightheadedness -breathing problems -changes in hearing -changes in vision -chest pain -high blood pressure -low blood counts - This drug may decrease the number of white blood cells, red blood cells and platelets. You may be at increased risk for infections and bleeding. -nausea and vomiting -pain, swelling, redness or irritation at the injection site -pain, tingling, numbness in the hands or feet -problems with balance, talking, walking -trouble passing urine or change in the amount of urine Side effects that usually do not require medical attention (report to your doctor or health care professional if they continue or are bothersome): -hair loss -loss of appetite -metallic taste in the mouth or changes in taste This list may not describe all possible side effects. Call your doctor for medical advice about side effects. You may report side effects to FDA at 1-800-FDA-1088. Where should I keep my medicine? This drug is given in a hospital or clinic and will not be stored at home. NOTE: This sheet is a summary. It may not cover all possible information. If you have questions about this medicine, talk to your doctor, pharmacist, or health care provider.  2018 Elsevier/Gold Standard (2007-05-02 14:38:05)

## 2016-04-19 NOTE — Assessment & Plan Note (Signed)
He has atypical chest pain likely due to CPR effort. Recent imaging confirmed rib fractures. I refill his prescription fentanyl patch and recommend he takes morphine sulfate for breakthrough pain. We discussed taper effort once his rib pain resolves

## 2016-04-19 NOTE — Progress Notes (Signed)
[facility] Spiritual Care Note  Followed up with Benjamin Barr's wife Benjamin Barr in infusion while he was sleeping.  She used the opportunity well to share and process her caregiver stress and grief due to her mother's death last 05/30/2022.  Will continue to support her emotionally when couple is on campus. Additionally, Benjamin Barr has my card and brochure and knows to reach out anytime.   [place], [place], [facility] Pager [phone] Voicemail [phone]

## 2016-04-19 NOTE — Patient Instructions (Signed)
Implanted Port Home Guide An implanted port is a type of central line that is placed under the skin. Central lines are used to provide IV access when treatment or nutrition needs to be given through a person's veins. Implanted ports are used for long-term IV access. An implanted port may be placed because:  You need IV medicine that would be irritating to the small veins in your hands or arms.  You need long-term IV medicines, such as antibiotics.  You need IV nutrition for a long period.  You need frequent blood draws for lab tests.  You need dialysis.  Implanted ports are usually placed in the chest area, but they can also be placed in the upper arm, the abdomen, or the leg. An implanted port has two main parts:  Reservoir. The reservoir is round and will appear as a small, raised area under your skin. The reservoir is the part where a needle is inserted to give medicines or draw blood.  Catheter. The catheter is a thin, flexible tube that extends from the reservoir. The catheter is placed into a large vein. Medicine that is inserted into the reservoir goes into the catheter and then into the vein.  How will I care for my incision site? Do not get the incision site wet. Bathe or shower as directed by your health care provider. How is my port accessed? Special steps must be taken to access the port:  Before the port is accessed, a numbing cream can be placed on the skin. This helps numb the skin over the port site.  Your health care provider uses a sterile technique to access the port. ? Your health care provider must put on a mask and sterile gloves. ? The skin over your port is cleaned carefully with an antiseptic and allowed to dry. ? The port is gently pinched between sterile gloves, and a needle is inserted into the port.  Only "non-coring" port needles should be used to access the port. Once the port is accessed, a blood return should be checked. This helps ensure that the port  is in the vein and is not clogged.  If your port needs to remain accessed for a constant infusion, a clear (transparent) bandage will be placed over the needle site. The bandage and needle will need to be changed every week, or as directed by your health care provider.  Keep the bandage covering the needle clean and dry. Do not get it wet. Follow your health care provider's instructions on how to take a shower or bath while the port is accessed.  If your port does not need to stay accessed, no bandage is needed over the port.  What is flushing? Flushing helps keep the port from getting clogged. Follow your health care provider's instructions on how and when to flush the port. Ports are usually flushed with saline solution or a medicine called heparin. The need for flushing will depend on how the port is used.  If the port is used for intermittent medicines or blood draws, the port will need to be flushed: ? After medicines have been given. ? After blood has been drawn. ? As part of routine maintenance.  If a constant infusion is running, the port may not need to be flushed.  How long will my port stay implanted? The port can stay in for as long as your health care provider thinks it is needed. When it is time for the port to come out, surgery will be   done to remove it. The procedure is similar to the one performed when the port was put in. When should I seek immediate medical care? When you have an implanted port, you should seek immediate medical care if:  You notice a bad smell coming from the incision site.  You have swelling, redness, or drainage at the incision site.  You have more swelling or pain at the port site or the surrounding area.  You have a fever that is not controlled with medicine.  This information is not intended to replace advice given to you by your health care provider. Make sure you discuss any questions you have with your health care provider. Document  Released: 01/25/2005 Document Revised: 07/03/2015 Document Reviewed: 10/02/2012 Elsevier Interactive Patient Education  2017 [employer]  

## 2016-04-19 NOTE — Progress Notes (Signed)
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 Biopsy recommended. No definite pharyngeal mass lesion identified by CT.       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 No other primary or metastatic cancer. 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      03/11/2016 PET scan    Resolution of hypermetabolic right cervical lymphadenopathy since prior study . No other hypermetabolic lymphadenopathy identified. New hypermetabolic lesion and caudate lobe of liver, highly suspicious for liver metastasis. Recommend liver protocol abdomen MRI without and with contrast for further evaluation. New patchy airspace disease in both lung apices with FDG uptake. 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 Recommend continued attention on follow-up imaging.       03/19/2016 Imaging    MRI showed 3.4 cm heterogeneously enhancing mass in caudate lobe of liver, consistent liver metastasis. No other sites of metastatic disease identified within the liver or abdomen      03/26/2016 Procedure    Ultrasound and fluoroscopically guided right internal jugular single lumen power port catheter insertion. Tip in the SVC/RA junction. Catheter ready for use      04/05/2016 Adverse Reaction    The patient developed cardiac arrest due to severe anaphylactic reaction to cetuximab      04/05/2016 - 04/06/2016 Hospital Admission    The patient was admitted briefly for observation due to CPR given after anaphylactic reaction to cetuximab      04/19/2016 -  Chemotherapy    The patient received cycle 1 of carboplatin 5-FU       Cancer of base of tongue ([place])    INTERVAL HISTORY: Please see below for problem oriented charting. He is seen today with his wife. His symptoms of atypical chest pain is improving on fentanyl patch and morphine sulfate. His appetite is poor but he is eating better.  REVIEW OF SYSTEMS:   Constitutional: Denies fevers, chills or abnormal weight loss Eyes: Denies blurriness of vision Ears, nose, mouth, throat, and face: Denies mucositis or sore throat Respiratory: Denies cough, dyspnea or wheezes Cardiovascular: Denies palpitation, chest discomfort or lower extremity swelling Gastrointestinal:  Denies  nausea, heartburn or change in bowel habits Skin: Denies abnormal skin rashes Lymphatics: Denies new lymphadenopathy or easy bruising Neurological:Denies numbness, tingling or new weaknesses Behavioral/Psych: Mood is stable, no new changes  All other systems were reviewed with the patient and are negative.  I have reviewed the past medical history, past surgical  history, social history and family history with the patient and they are unchanged from previous note.  ALLERGIES:  is allergic to cetuximab.  MEDICATIONS:  Current Outpatient Prescriptions  Medication Sig Dispense Refill  . aspirin EC 81 MG tablet Take 81 mg by mouth at bedtime.     . fentaNYL (DURAGESIC - DOSED MCG/HR) 25 MCG/HR patch Place 1 patch (25 mcg total) onto the skin every 3 (three) days. 5 patch 0  . fluticasone (FLONASE) 50 MCG/ACT nasal spray Place 2 sprays into both nostrils at bedtime as needed for rhinitis.     [name] insulin glargine (LANTUS) 100 UNIT/ML injection Inject 100 Units into the skin at bedtime.     [name] ketoconazole (NIZORAL) 2 % shampoo Apply 1 application topically every 3 (three) days.    [name] lidocaine-prilocaine (EMLA) cream Apply 1 application topically once as needed (prior to accessing port).    . metoprolol succinate (TOPROL-XL) 25 MG 24 hr tablet Take 0.5 tablets (12.5 mg total) by mouth daily. 30 tablet 0  . mirtazapine (REMERON) 15 MG tablet Take 1 tablet (15 mg total) by mouth at bedtime. 30 tablet 11  . morphine (MSIR) 15 MG tablet Take 1 tablet (15 mg total) by mouth every 4 (four) hours as needed for severe pain. 60 tablet 0  . Nutritional Supplements (FEEDING SUPPLEMENT, OSMOLITE 1.5 CAL,) LIQD Give 1 1/2 cans osmolite 1.5 via PEG QID with 60 cc free water before and after feedings.  In addition, flush tube with 240 cc free water TID between meals. 6 Bottle 0  . silver sulfADIAZINE (SILVADENE) 1 % cream Apply 1 application topically at bedtime. Pt applies to sacral ulceration area.    . sodium fluoride (FLUORISHIELD) 1.1 % GEL dental gel Instill one drop of gel per tooth space of fluoride tray. Place over teeth for 5 minutes. Remove. Spit out excess. Repeat nightly. 120 mL 11  . clopidogrel (PLAVIX) 75 MG tablet TAKE 1 TABLET EVERY DAY (Patient not taking: Reported on 04/19/2016) 90 tablet 1  . ondansetron (ZOFRAN) 8 MG tablet Take 1 tablet (8 mg total) by  mouth every 8 (eight) hours as needed for nausea. (Patient not taking: Reported on 04/19/2016) 60 tablet 3  . oxyCODONE-acetaminophen (PERCOCET/ROXICET) 5-325 MG tablet Take 1 tablet by mouth every 4 (four) hours as needed for severe pain. (Patient not taking: Reported on 04/19/2016) 18 tablet 0  . prochlorperazine (COMPAZINE) 10 MG tablet Take 1 tablet (10 mg total) by mouth every 6 (six) hours as needed. (Patient not taking: Reported on 04/19/2016) 60 tablet 3   No current facility-administered medications for this visit.    Facility-Administered Medications Ordered in Other Visits  Medication Dose Route Frequency Provider Last Rate Last Dose  . CARBOplatin (PARAPLATIN) 510 mg in sodium chloride 0.9 % 250 mL chemo infusion  510 mg Intravenous Once Heath Lark, MD 602 mL/hr at 04/19/16 1306 510 mg at 04/19/16 1306  . fluorouracil (ADRUCIL) 8,000 mg in sodium chloride 0.9 % 90 mL chemo infusion  1,005 mg/m2/day (Treatment Plan Recorded) Intravenous 4 days Heath Lark, MD        PHYSICAL EXAMINATION: ECOG PERFORMANCE STATUS: 1 - Symptomatic but completely ambulatory  Vitals:   04/19/16 1042  BP: (!) 126/59  Pulse: 89  Resp: 18  Temp: 98.1 F (36.7 C)   Filed Weights   04/19/16 1042  Weight: 184 lb 9.6 oz (83.7 kg)    GENERAL:alert, no distress and comfortable SKIN: skin color, texture, turgor are normal, no rashes or significant lesions EYES: normal, Conjunctiva are pink and non-injected, sclera clear OROPHARYNX:no exudate, no erythema and lips, buccal mucosa, and tongue normal  NECK: supple, thyroid normal size, non-tender, without nodularity LYMPH:  no palpable lymphadenopathy in the cervical, axillary or inguinal LUNGS: clear to auscultation and percussion with normal breathing effort HEART: regular rate & rhythm and no murmurs and no lower extremity edema ABDOMEN:abdomen soft, non-tender and normal bowel sounds Musculoskeletal:no cyanosis of digits and no clubbing  NEURO: alert &  oriented x 3 with fluent speech, no focal motor/sensory deficits  LABORATORY DATA:  I have reviewed the data as listed    Component Value Date/Time   NA 138 04/19/2016 1006   K 4.4 04/19/2016 1006   CL 99 (L) 04/12/2016 1211   CO2 28 04/19/2016 1006   GLUCOSE 249 (H) 04/19/2016 1006   BUN 20.0 04/19/2016 1006   CREATININE 1.0 04/19/2016 1006   CALCIUM 9.8 04/19/2016 1006   PROT 6.6 04/19/2016 1006   ALBUMIN 2.7 (L) 04/19/2016 1006   AST 20 04/19/2016 1006   ALT 13 04/19/2016 1006   ALKPHOS 138 04/19/2016 1006   BILITOT 0.57 04/19/2016 1006   GFRNONAA >60 04/05/2016 2014   GFRAA >60 04/05/2016 2014    No results found for: SPEP, UPEP  Lab Results  Component Value Date   WBC 16.5 (H) 04/19/2016   NEUTROABS 14.8 (H) 04/19/2016   HGB 12.5 (L) 04/19/2016   HCT 37.9 (L) 04/19/2016   MCV 85.5 04/19/2016   PLT 240 04/19/2016      Chemistry      Component Value Date/Time   NA 138 04/19/2016 1006   K 4.4 04/19/2016 1006   CL 99 (L) 04/12/2016 1211   CO2 28 04/19/2016 1006   BUN 20.0 04/19/2016 1006   CREATININE 1.0 04/19/2016 1006   GLU 78 11/13/2015 1134      Component Value Date/Time   CALCIUM 9.8 04/19/2016 1006   ALKPHOS 138 04/19/2016 1006   AST 20 04/19/2016 1006   ALT 13 04/19/2016 1006   BILITOT 0.57 04/19/2016 1006       RADIOGRAPHIC STUDIES: I have personally reviewed the radiological images as listed and agreed with the findings in the report. Dg Chest 2 View  Result Date: 04/12/2016 CLINICAL DATA:  Cough, chest congestion, atypical chest pain, the patient underwent recent CPR. Suspect rib fractures. History of head and neck malignancy and hepatic malignancy, diabetes, hypertension, coronary artery disease with stent placement. EXAM: CHEST  2 VIEW COMPARISON:  PA and lateral chest x-ray of May 22, 2015 and PET-CT study of March 11, 2016. FINDINGS: The lungs are well-expanded. There is a small right pleural effusion. There is no alveolar infiltrate or  pleural effusion. No pulmonary parenchymal nodules or masses are observed. The heart and pulmonary vascularity are normal. The power port catheter tip projects over the midportion of the SVC. There is calcification in the wall of the aortic arch. The trachea is midline. The bony thorax exhibits no acute abnormality. IMPRESSION: New small right pleural effusion. No pulmonary parenchymal masses or nodules. No pneumonia nor CHF. Thoracic aortic atherosclerosis. Electronically Signed   By: David  [name] M.D.   On:  04/12/2016 10:07   Ct Chest W Contrast  Result Date: 04/12/2016 CLINICAL DATA:  Chest pain and cough following spur recent CPR EXAM: CT CHEST WITH CONTRAST TECHNIQUE: Multidetector CT imaging of the chest was performed during intravenous contrast administration. CONTRAST:  66mL ISOVUE-300 IOPAMIDOL (ISOVUE-300) INJECTION 61% COMPARISON:  Chest x-ray from earlier in the same day, PET-CT from 03/11/2016 FINDINGS: Cardiovascular: Thoracic aorta demonstrates atherosclerotic calcifications without aneurysmal dilatation or dissection. Heavy coronary calcifications are noted throughout the coronary system. No significant cardiac enlargement is seen. No definitive pulmonary embolism is seen. Right chest wall port is noted. Mediastinum/Nodes: The thoracic inlet is within normal limits. No significant mediastinal or hilar adenopathy is seen. Lungs/Pleura: The lungs are well aerated bilaterally. A new right-sided pleural effusion is seen. There is a nodular density in the left posterior costophrenic angle which is new from recent exams. This measures 6-7 mm and is best seen on image number 93 of series 5. Patchy changes remain in the upper lobes bilaterally again likely related to radiation pneumonitis. Mild left basilar atelectasis is seen related to the pleural effusion. Upper Abdomen: Right renal cyst is noted. There is an irregularly enhancing lesion within the caudate lobe of the liver again suspicious for  metastatic disease. Musculoskeletal: Degenerative change of the thoracic spine is noted. Fractures of the right second third and fourth ribs are noted. This is likely related to the recent CPR and consistent with the patient's given clinical history of right chest pain. No pneumothorax is noted IMPRESSION: Right rib fractures with associated right pleural effusion this is likely the etiology of the patient's discomfort. No pneumothorax is noted. Caudate lobe lesion consistent with metastatic disease. Vague 6-7 mm nodule in the posterior left costophrenic angle. Metastatic disease would be difficult to exclude although this was not well seen on a prior exam from 1 month ago. Short-term follow-up in 3 months is recommended. Electronically Signed   By: Inez Catalina M.D.   On: 04/12/2016 14:04   Ir US Guide Vasc Access Right  Result Date: 03/26/2016 CLINICAL DATA:  METASTATIC TONGUE CANCER, ACCESS CHEMOTHERAPY EXAM: RIGHT INTERNAL JUGULAR SINGLE LUMEN POWER PORT CATHETER INSERTION Date:  2/16/20182/16/2018 3:31 pm Radiologist:  M. Daryll Brod, MD Guidance:  Ultrasound and fluoroscopic MEDICATIONS: 2 g Ancef; The antibiotic was administered within an appropriate time interval prior to skin puncture. ANESTHESIA/SEDATION: Versed 3.0 mg IV; Fentanyl 50 mcg IV; Moderate Sedation Time:  35 The patient was continuously monitored during the procedure by the interventional radiology nurse under my direct supervision. FLUOROSCOPY TIME:  48 seconds (13 mGy) COMPLICATIONS: None immediate. CONTRAST:  None. PROCEDURE: Informed consent was obtained from the patient following explanation of the procedure, risks, benefits and alternatives. The patient understands, agrees and consents for the procedure. All questions were addressed. A time out was performed. Maximal barrier sterile technique utilized including caps, mask, sterile gowns, sterile gloves, large sterile drape, hand hygiene, and 2% chlorhexidine scrub. Under sterile  conditions and local anesthesia, right internal jugular micropuncture venous access was performed. Access was performed with ultrasound. Images were obtained for documentation. A guide wire was inserted followed by a transitional dilator. This allowed insertion of a guide wire and catheter into the IVC. Measurements were obtained from the SVC / RA junction back to the right IJ venotomy site. In the right infraclavicular chest, a subcutaneous pocket was created over the second anterior rib. This was done under sterile conditions and local anesthesia. 1% lidocaine with epinephrine was utilized for this. A 2.5 cm  incision was made in the skin. Blunt dissection was performed to create a subcutaneous pocket over the right pectoralis major muscle. The pocket was flushed with saline vigorously. There was adequate hemostasis. The port catheter was assembled and checked for leakage. The port catheter was secured in the pocket with two retention sutures. The tubing was tunneled subcutaneously to the right venotomy site and inserted into the SVC/RA junction through a valved peel-away sheath. Position was confirmed with fluoroscopy. Images were obtained for documentation. The patient tolerated the procedure well. No immediate complications. Incisions were closed in a two layer fashion with 4 - 0 Vicryl suture. Dermabond was applied to the skin. The port catheter was accessed, blood was aspirated followed by saline and heparin flushes. Needle was removed. A dry sterile dressing was applied. IMPRESSION: Ultrasound and fluoroscopically guided right internal jugular single lumen power port catheter insertion. Tip in the SVC/RA junction. Catheter ready for use. Electronically Signed   By: Jerilynn Mages.  Shick M.D.   On: 03/26/2016 15:39   Ir Fluoro Guide Port Insertion Right  Result Date: 03/26/2016 CLINICAL DATA:  METASTATIC TONGUE CANCER, ACCESS CHEMOTHERAPY EXAM: RIGHT INTERNAL JUGULAR SINGLE LUMEN POWER PORT CATHETER INSERTION Date:   2/16/20182/16/2018 3:31 pm Radiologist:  M. Daryll Brod, MD Guidance:  Ultrasound and fluoroscopic MEDICATIONS: 2 g Ancef; The antibiotic was administered within an appropriate time interval prior to skin puncture. ANESTHESIA/SEDATION: Versed 3.0 mg IV; Fentanyl 50 mcg IV; Moderate Sedation Time:  35 The patient was continuously monitored during the procedure by the interventional radiology nurse under my direct supervision. FLUOROSCOPY TIME:  48 seconds (13 mGy) COMPLICATIONS: None immediate. CONTRAST:  None. PROCEDURE: Informed consent was obtained from the patient following explanation of the procedure, risks, benefits and alternatives. The patient understands, agrees and consents for the procedure. All questions were addressed. A time out was performed. Maximal barrier sterile technique utilized including caps, mask, sterile gowns, sterile gloves, large sterile drape, hand hygiene, and 2% chlorhexidine scrub. Under sterile conditions and local anesthesia, right internal jugular micropuncture venous access was performed. Access was performed with ultrasound. Images were obtained for documentation. A guide wire was inserted followed by a transitional dilator. This allowed insertion of a guide wire and catheter into the IVC. Measurements were obtained from the SVC / RA junction back to the right IJ venotomy site. In the right infraclavicular chest, a subcutaneous pocket was created over the second anterior rib. This was done under sterile conditions and local anesthesia. 1% lidocaine with epinephrine was utilized for this. A 2.5 cm incision was made in the skin. Blunt dissection was performed to create a subcutaneous pocket over the right pectoralis major muscle. The pocket was flushed with saline vigorously. There was adequate hemostasis. The port catheter was assembled and checked for leakage. The port catheter was secured in the pocket with two retention sutures. The tubing was tunneled subcutaneously to the  right venotomy site and inserted into the SVC/RA junction through a valved peel-away sheath. Position was confirmed with fluoroscopy. Images were obtained for documentation. The patient tolerated the procedure well. No immediate complications. Incisions were closed in a two layer fashion with 4 - 0 Vicryl suture. Dermabond was applied to the skin. The port catheter was accessed, blood was aspirated followed by saline and heparin flushes. Needle was removed. A dry sterile dressing was applied. IMPRESSION: Ultrasound and fluoroscopically guided right internal jugular single lumen power port catheter insertion. Tip in the SVC/RA junction. Catheter ready for use. Electronically Signed   By: Jerilynn Mages.  Shick M.D.   On: 03/26/2016 15:39    ASSESSMENT & PLAN:  Tongue cancer ([place]) Due to allergic reaction, cetuximab is discontinued permanently. He will proceed with cycle 1 of carboplatin 5-FU today.  Goal is to give him 3 cycles of treatment before repeat staging scan.  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 Potentially, it could be resectable in the future if he has excellent response to chemotherapy. His liver function tests is preserved  Protein-calorie malnutrition, moderate (HCC) He is able to tolerate nutritional supplement better   Atypical chest pain He has atypical chest pain likely due to CPR effort. Recent imaging confirmed rib fractures. I refill his prescription fentanyl patch and recommend he takes morphine sulfate for breakthrough pain. We discussed taper effort once his rib pain resolves   No orders of the defined types were placed in this encounter.  All questions were answered. The patient knows to call the clinic with any problems, questions or concerns. No barriers to learning was detected. I spent 15 minutes counseling the patient face to face. The total time spent in the appointment was 20 minutes and more than 50% was on counseling and review of test results      Heath Lark, MD 04/19/2016 1:28 PM

## 2016-04-19 NOTE — Assessment & Plan Note (Signed)
Due to allergic reaction, cetuximab is discontinued permanently. He will proceed with cycle 1 of carboplatin 5-FU today.  Goal is to give him 3 cycles of treatment before repeat staging scan.

## 2016-04-20 ENCOUNTER — Telehealth: Payer: Self-pay

## 2016-04-20 NOTE — Telephone Encounter (Signed)
-----   Message from Paulla Dolly, RN sent at 04/19/2016  3:27 PM EDT ----- Regarding: first chemo Contact: [phone] First treatment with Norma Fredrickson 5FU.  Previously had erbitux Cell   [place]   [phone]

## 2016-04-20 NOTE — Progress Notes (Signed)
Cardiology Office Note   Date:  04/22/2016   ID:  Benjamin, Barr [birth year], MRN [id number]  PCP:  Benjamin Copa, MD  Cardiologist:  Dr. Peter Barr     Chief Complaint  Patient presents with  . Follow-up    6 months  . Coronary Artery Disease      History of Present Illness: Benjamin Barr is a 71 y.o. male who presents for follow up CAD.  He has w/ PMH of CAD w/ Taxus DES to LCx & RCA in 2008, rheumatoid arthritis (& new Dx of PMR), type II DM, prostate CA.He presented in October 2016 with progressive angina.  He had stenting of the ostial LCx, with DES, stenting of mRCA with DES and POBA of the PDA. The prior stents were still patent.   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 He received RT and a gastroscopy feeding tube. In February scans revealed new hepatic metastases. A PortAcath was placed.  He was started on chemotherapy with cetuximab but suffered a cardiac arrest related to an anaphylactic reaction to chemo. During arrest he suffered multiple rib fractures due to CPR. He had transient Afib (after receiving IV epinephrine) that converted to NSR. He was started on low dose metoprolol. Discharged 04/06/26.   On follow up today he is seen with his wife. He had fairly severe pain from rib fractures suffered during CPR. This is finally getting better. He denies any anginal pain. He continues on tube feeds. States his swallowing is much better but when he tries to eat this throws his sugars off. Now getting chemo with carboplatin and 5-FU.   Past Medical History:  Diagnosis Date  . Anemia   . CAD (coronary artery disease)    a. 2008 PCI: Taxus DES  to mRCA, prox & mid LCFX;  b. 10/2014 MV: extensive ischemia in the RCA territory and small area of anterior ischemia;  c. 11/2014 PCI: LM nl, LAD 30ost/prox, 80d, LCX 90ost (3.0x12 Promus Prem DES), OM2 30, RCA 85m (3.0x12  Promus Prem DES), 68m, RPDA 90 (PTCA), EF 55-65%.  . Cancer ([place])    Head & neck ca  . Diverticulosis of colon   . Heart murmur   . History of colon polyps    2014--  hyperplastic, adenoma, and benign polyps  . History of radiation therapy 10/01/15- 11/20/15   Oropharynx/base of neck 70 Gy in 35 fractions  . Hyperlipidemia   . Hypertension   . Myocardial infarction   . Nonproliferative diabetic retinopathy ([place])   . Obesity   . Peripheral neuropathy ([place])   . Pneumonia    hx  . Prostate cancer ([place])   . RBBB (right bundle branch block)   . Skin cancer   . Type 2 diabetes mellitus ([name])     Past Surgical History:  Procedure Laterality Date  . APPENDECTOMY    . CARDIAC CATHETERIZATION N/A 11/13/2014   Procedure: Left Heart Cath and Coronary Angiography;  Surgeon: Benjamin M [name], MD;  Location: [place] [facility];  Service: Cardiovascular;  Laterality: N/A;  . CARDIAC CATHETERIZATION  11/13/2014   Procedure: Coronary Stent Intervention;  Surgeon: Benjamin M [name], MD;  Location: [place] [facility];  Service: Cardiovascular;;  . CARDIOVASCULAR STRESS TEST  01-17-2012  dr Barr   Normal perfusion study/ no ischemia or infarction/  normal LVF and wall motion, ef 54%  .  CORONARY ANGIOPLASTY WITH STENT PLACEMENT  05/04/2006   dr Barr   Severe 2-vessel obstructive CAD/  normal LVF/  PCI with DES to proxima and mid CFX and mRCA (total 3 DES)  . CRYOABLATION N/A 04/01/2014   Procedure: CRYO ABLATION PROSTATE;  Surgeon: Benjamin Rud, MD;  Location: [facility];  Service: Urology;  Laterality: N/A;  . DIRECT LARYNGOSCOPY N/A 09/19/2015   Procedure: DIRECT LARYNGOSCOPY;  Surgeon: Benjamin Gala, MD;  Location: [place];  Service: ENT;  Laterality: N/A;  . ESOPHAGOSCOPY N/A 09/19/2015   Procedure: ESOPHAGOSCOPY;  Surgeon: Benjamin Gala, MD;  Location: [place];  Service: ENT;  Laterality: N/A;  . FLOOR OF MOUTH BIOPSY N/A 09/19/2015   Procedure: FLOOR OF MOUTH BIOPSY;  Surgeon: Benjamin Gala, MD;  Location: [facility];  Service: ENT;  Laterality: N/A;  . INSERTION OF SUPRAPUBIC CATHETER N/A 04/01/2014   Procedure: INSERTION OF SUPRAPUBIC CATHETER;  Surgeon: Benjamin Rud, MD;  Location: [facility];  Service: Urology;  Laterality: N/A;  suprapubic   . IR GENERIC HISTORICAL  11/13/2015   IR GASTROSTOMY TUBE MOD SED 11/13/2015 Benjamin Mckusick, DO [facility]  . IR GENERIC HISTORICAL  03/26/2016   IR US GUIDE VASC ACCESS RIGHT 03/26/2016 Benjamin Keen, MD [facility]  . IR GENERIC HISTORICAL  03/26/2016   IR FLUORO GUIDE PORT INSERTION RIGHT 03/26/2016 Benjamin Keen, MD [facility]  . MULTIPLE TOOTH EXTRACTIONS  09/17/2015  . PARTIAL COLECTOMY  80's     Current Outpatient Prescriptions  Medication Sig Dispense Refill  . aspirin EC 81 MG tablet Take 81 mg by mouth at bedtime.     . clopidogrel (PLAVIX) 75 MG tablet TAKE 1 TABLET EVERY DAY 90 tablet 1  . fentaNYL (DURAGESIC - DOSED MCG/HR) 25 MCG/HR patch Place 1 patch (25 mcg total) onto the skin every 3 (three) days. 5 patch 0  . fluticasone (FLONASE) 50 MCG/ACT nasal spray Place 2 sprays into both nostrils at bedtime as needed for rhinitis.     Benjamin Barr insulin glargine (LANTUS) 100 UNIT/ML injection Inject 100 Units into the skin at bedtime.     Benjamin Barr ketoconazole (NIZORAL) 2 % shampoo Apply 1 application topically every 3 (three) days.    Benjamin Barr lidocaine-prilocaine (EMLA) cream Apply 1 application topically once as needed (prior to accessing port).    . mirtazapine (REMERON) 15 MG tablet Take 1 tablet (15 mg total) by mouth at bedtime. 30 tablet 11  . morphine (MSIR) 15 MG tablet Take 1 tablet (15 mg total) by mouth every 4 (four) hours as needed for severe pain. 60 tablet 0  . Nutritional Supplements (FEEDING SUPPLEMENT, OSMOLITE 1.5 CAL,) LIQD Give 1 1/2 cans osmolite 1.5 via PEG QID with 60 cc free water before and after feedings.  In addition, flush tube with 240 cc free water TID between meals. 6 Bottle 0  .  oxyCODONE-acetaminophen (PERCOCET/ROXICET) 5-325 MG tablet Take 1 tablet by mouth every 4 (four) hours as needed for severe pain. 18 tablet 0  . silver sulfADIAZINE (SILVADENE) 1 % cream Apply 1 application topically at bedtime. Pt applies to sacral ulceration area.    . sodium fluoride (FLUORISHIELD) 1.1 % GEL dental gel Instill one drop of gel per tooth space of fluoride tray. Place over teeth for 5 minutes. Remove. Spit out excess. Repeat nightly. 120 mL 11  . simvastatin (ZOCOR) 20 MG tablet Take 1 tablet (20 mg total) by mouth at bedtime. 90 tablet 3   No current  facility-administered medications for this visit.     Allergies:   Cetuximab    Social History:  The patient  reports that he has never smoked. He has never used smokeless tobacco. He reports that he drinks alcohol. He reports that he does not use drugs.   Family History:  The patient's family history includes Cancer in his father; Diverticulitis in his mother; Heart failure in his father.    ROS:  As noted in HPI. All other systems are reviewed and are negative.  Wt Readings from Last 3 Encounters:  04/22/16 182 lb (82.6 kg)  04/19/16 184 lb 9.6 oz (83.7 kg)  04/12/16 179 lb (81.2 kg)     PHYSICAL EXAM: VS:  BP 126/74 (BP Location: Right Arm, Patient Position: Sitting, Cuff Size: Normal)   Pulse 94   Ht 5\' 8"  (1.727 m)   Wt 182 lb (82.6 kg)   BMI 27.67 kg/m  , BMI Body mass index is 27.67 kg/m. General:Pleasant affect, NAD Skin:Warm and dry, brisk capillary refill HEENT:normocephalic, sclera clear, mucus membranes moist Neck:supple, no JVD, no bruits  Heart:S1S2 RRR without murmur, gallup, rub or click Lungs:clear without rales, rhonchi, or wheezes QAS:TMHD, non tender, + BS, do not palpate liver spleen or masses Ext:no lower ext edema, 2+ pedal pulses, 2+ radial pulses Neuro:alert and oriented, MAE, follows commands, + facial symmetry   Recent Labs: 04/19/2016: ALT 13; BUN 20.0; Creatinine 1.0; HGB 12.5;  Magnesium 1.8; Platelets 240; Potassium 4.4; Sodium 138  Dated 09/15/15: A1c 9.7%   Lipid Panel No results found for: CHOL, TRIG, HDL, CHOLHDL, VLDL, LDLCALC, LDLDIRECT   Last lipid panel 07/26/14: cholesterol 146, triglycerides 398, LDL 34, HDL 32.   Other studies Reviewed: Additional studies/ records that were reviewed today include:   Cardiac cath October 2016: Procedures    Coronary Stent Intervention   Left Heart Cath and Coronary Angiography    Conclusion     Ost LAD to Prox LAD lesion, 30% stenosed.  Dist LAD lesion, 80% stenosed.  2nd Mrg lesion, 30% stenosed.  Ost Cx lesion, 90% stenosed.  The left ventricular systolic function is normal.  Ost Cx to Prox Cx lesion, 90% stenosed. This is proximal the the prior stent. There is a 0% residual stenosis post intervention.  RPDA lesion, 90% stenosed. There is a 10% residual stenosis post intervention.  Mid RCA-1 lesion, 90% stenosed. There is a 0% residual stenosis post intervention. A drug-eluting stent was placed. This is proximal to the prior stent.  Mid RCA-1 lesion, 90% stenosed.  A drug-eluting stent was placed.  A drug-eluting stent was placed.  1. Severe 2 vessel obstructive CAD  90% ostial LCx. Prior stent in the proximal to mid LCx is patent.  90% mid RCA proximal to prior stent. The old stent is patent  90% PDA 2. Normal LV function 3. Successful stenting of the ostial LCx with a DES 4. Successful stenting of the mid RCA with a DES  5. Successful POBA of the PDA.  Plan: continue DAPT indefinitely. Anticipate DC in am.     Echo: 05/22/14:Study Conclusions  - Left ventricle: The cavity size was normal. There was mild concentric hypertrophy. Systolic function was normal. The estimated ejection fraction was in the range of 60% to 65%. Wall motion was normal; there were no regional wall motion abnormalities. There was an increased relative contribution of atrial contraction to  ventricular filling. Doppler parameters are consistent with abnormal left ventricular relaxation (grade 1 diastolic dysfunction). - Aortic valve:  Moderate diffuse thickening and calcification, consistent with sclerosis. - Mitral valve: Calcified annulus. Moderate focal calcification of the anterior leaflet. There was mild regurgitation. - Pericardium, extracardiac: A trivial pericardial effusion was identified posterior to the heart.  ASSESSMENT AND PLAN:  1.  CAD  s/p DES stents to ostial LCX, and mRCA and angioplasty to PDA in October 2016.  Continue DAPT. He continues on ASA and Plavix.  Continue  risk factor modification.  2.   Hyperlipidemia he reports he has been off Zocor for some time. Will renew at 20 mg daily. Check LFTs and lipid panel in 3 months.   3.  Essential HTN  BP is well controlled.   4. DM followed by Dr. Buddy Duty  5. PMR followed by PCP- not currently on steroids.   6. SSCA of the tongue- metastatic. S/p  RT. Anaphylactic reaction to cetuximab with cardiac arrest. Now on carboplatin and 5-FU.   7. Transient Afib in setting of cardiac arrest resuscitation. No recurrence. OK to stop Toprol XL at this time as long as BP under control.    Current medicines are reviewed with the patient today.  The patient Has no concerns regarding medicines.  The following changes have been made:  See above Labs/ tests ordered today include:see above  Disposition:   FU: 6 months.  Signed, Benjamin [name], MD  04/22/2016 12:56 PM    [facility] [place], [address] [place], [place] Phone: [phone]; Fax: [phone]

## 2016-04-20 NOTE — Telephone Encounter (Signed)
Pt states he is doing well. He is able to take fluids in tube. He is not nauseated. Bowels moved this am. He has not questions at this time. He did not mention any pain.

## 2016-04-22 ENCOUNTER — Telehealth: Payer: Self-pay | Admitting: *Deleted

## 2016-04-22 ENCOUNTER — Encounter: Payer: Self-pay | Admitting: Cardiology

## 2016-04-22 ENCOUNTER — Ambulatory Visit (INDEPENDENT_AMBULATORY_CARE_PROVIDER_SITE_OTHER): Payer: Medicare Other | Admitting: Cardiology

## 2016-04-22 ENCOUNTER — Telehealth: Payer: Self-pay | Admitting: Hematology and Oncology

## 2016-04-22 VITALS — BP 126/74 | HR 94 | Ht 68.0 in | Wt 182.0 lb

## 2016-04-22 DIAGNOSIS — Z794 Long term (current) use of insulin: Secondary | ICD-10-CM

## 2016-04-22 DIAGNOSIS — Z9861 Coronary angioplasty status: Secondary | ICD-10-CM

## 2016-04-22 DIAGNOSIS — I2510 Atherosclerotic heart disease of native coronary artery without angina pectoris: Secondary | ICD-10-CM | POA: Diagnosis not present

## 2016-04-22 DIAGNOSIS — E1165 Type 2 diabetes mellitus with hyperglycemia: Secondary | ICD-10-CM | POA: Diagnosis not present

## 2016-04-22 DIAGNOSIS — IMO0001 Reserved for inherently not codable concepts without codable children: Secondary | ICD-10-CM

## 2016-04-22 DIAGNOSIS — E7800 Pure hypercholesterolemia, unspecified: Secondary | ICD-10-CM

## 2016-04-22 DIAGNOSIS — I10 Essential (primary) hypertension: Secondary | ICD-10-CM | POA: Diagnosis not present

## 2016-04-22 MED ORDER — SIMVASTATIN 20 MG PO TABS: 20.0000 mg | ORAL_TABLET | Freq: Every day | ORAL | 3 refills | Status: DC

## 2016-04-22 NOTE — Telephone Encounter (Signed)
Oncology Nurse Navigator Documentation  Received call from patient wife re appt time tomorrow for pump disconnect indicating they were told 2:00 tomorrow but MyChart showing 10:15.  Obtained clarification from RN [facility], called wife to inform 2:00 tomorrow is correct.  [name], RN, BSN, [place] Nurse [facility] [phone]

## 2016-04-22 NOTE — Telephone Encounter (Signed)
Changed 3/16 pump/inj time to 2 pm per 3/15 schedule message. Spoke with patient he is aware.

## 2016-04-22 NOTE — Patient Instructions (Addendum)
Stop taking Toprol XL  Will resume simvastatin 20 mg daily for cholesterol   I will see you in 6 months.

## 2016-04-23 ENCOUNTER — Ambulatory Visit: Payer: Medicare Other

## 2016-04-23 ENCOUNTER — Ambulatory Visit (HOSPITAL_BASED_OUTPATIENT_CLINIC_OR_DEPARTMENT_OTHER): Payer: Medicare Other

## 2016-04-23 VITALS — BP 133/64 | HR 96 | Temp 99.1°F | Resp 18

## 2016-04-23 DIAGNOSIS — C787 Secondary malignant neoplasm of liver and intrahepatic bile duct: Secondary | ICD-10-CM

## 2016-04-23 DIAGNOSIS — C770 Secondary and unspecified malignant neoplasm of lymph nodes of head, face and neck: Secondary | ICD-10-CM

## 2016-04-23 DIAGNOSIS — C029 Malignant neoplasm of tongue, unspecified: Secondary | ICD-10-CM

## 2016-04-23 DIAGNOSIS — C01 Malignant neoplasm of base of tongue: Secondary | ICD-10-CM | POA: Diagnosis present

## 2016-04-23 MED ORDER — PEGFILGRASTIM INJECTION 6 MG/0.6ML ~~LOC~~
6.0000 mg | PREFILLED_SYRINGE | Freq: Once | SUBCUTANEOUS | Status: AC
  Administered 2016-04-23: 6 mg via SUBCUTANEOUS
  Filled 2016-04-23: qty 0.6

## 2016-04-23 MED ORDER — HEPARIN SOD (PORK) LOCK FLUSH 100 UNIT/ML IV SOLN
500.0000 [IU] | Freq: Once | INTRAVENOUS | Status: AC | PRN
  Administered 2016-04-23: 500 [IU]
  Filled 2016-04-23: qty 5

## 2016-04-23 MED ORDER — SODIUM CHLORIDE 0.9% FLUSH
10.0000 mL | INTRAVENOUS | Status: DC | PRN
  Administered 2016-04-23: 10 mL
  Filled 2016-04-23: qty 10

## 2016-04-23 NOTE — Progress Notes (Signed)
Talked to [employer], in infusion, stated pt pump was leaking. Not sure where leak was coming from. Pt arrived with pump off and line taped up, by pt, where leak was coming from. Called Jan RN for assistance. Pt had 39.3 mL left, 210.8 mL given. Alvy Bimler gave okay for pump to be taken off per Jan RN. Porsche Cates LPN

## 2016-05-10 ENCOUNTER — Ambulatory Visit: Payer: Medicare Other | Admitting: Nutrition

## 2016-05-10 ENCOUNTER — Other Ambulatory Visit (HOSPITAL_BASED_OUTPATIENT_CLINIC_OR_DEPARTMENT_OTHER): Payer: Medicare Other

## 2016-05-10 ENCOUNTER — Encounter: Payer: Self-pay | Admitting: General Practice

## 2016-05-10 ENCOUNTER — Encounter: Payer: Self-pay | Admitting: Hematology and Oncology

## 2016-05-10 ENCOUNTER — Ambulatory Visit: Payer: Medicare Other

## 2016-05-10 ENCOUNTER — Ambulatory Visit (HOSPITAL_BASED_OUTPATIENT_CLINIC_OR_DEPARTMENT_OTHER): Payer: Medicare Other

## 2016-05-10 ENCOUNTER — Ambulatory Visit (HOSPITAL_BASED_OUTPATIENT_CLINIC_OR_DEPARTMENT_OTHER): Payer: Medicare Other | Admitting: Hematology and Oncology

## 2016-05-10 VITALS — BP 131/74 | HR 93 | Temp 98.8°F | Resp 16

## 2016-05-10 DIAGNOSIS — Z5111 Encounter for antineoplastic chemotherapy: Secondary | ICD-10-CM

## 2016-05-10 DIAGNOSIS — K1231 Oral mucositis (ulcerative) due to antineoplastic therapy: Secondary | ICD-10-CM | POA: Diagnosis not present

## 2016-05-10 DIAGNOSIS — C787 Secondary malignant neoplasm of liver and intrahepatic bile duct: Secondary | ICD-10-CM | POA: Diagnosis not present

## 2016-05-10 DIAGNOSIS — R634 Abnormal weight loss: Secondary | ICD-10-CM

## 2016-05-10 DIAGNOSIS — C029 Malignant neoplasm of tongue, unspecified: Secondary | ICD-10-CM

## 2016-05-10 DIAGNOSIS — C770 Secondary and unspecified malignant neoplasm of lymph nodes of head, face and neck: Secondary | ICD-10-CM

## 2016-05-10 DIAGNOSIS — Z794 Long term (current) use of insulin: Secondary | ICD-10-CM | POA: Diagnosis not present

## 2016-05-10 DIAGNOSIS — C01 Malignant neoplasm of base of tongue: Secondary | ICD-10-CM

## 2016-05-10 DIAGNOSIS — E1140 Type 2 diabetes mellitus with diabetic neuropathy, unspecified: Secondary | ICD-10-CM

## 2016-05-10 LAB — CBC WITH DIFFERENTIAL/PLATELET
BASO%: 1 % (ref 0.0–2.0)
BASOS ABS: 0.2 10*3/uL — AB (ref 0.0–0.1)
EOS ABS: 0 10*3/uL (ref 0.0–0.5)
EOS%: 0 % (ref 0.0–7.0)
HEMATOCRIT: 40.7 % (ref 38.4–49.9)
HEMOGLOBIN: 13.6 g/dL (ref 13.0–17.1)
LYMPH#: 0.6 10*3/uL — AB (ref 0.9–3.3)
LYMPH%: 4.1 % — ABNORMAL LOW (ref 14.0–49.0)
MCH: 28.5 pg (ref 27.2–33.4)
MCHC: 33.4 g/dL (ref 32.0–36.0)
MCV: 85.3 fL (ref 79.3–98.0)
MONO#: 0.8 10*3/uL (ref 0.1–0.9)
MONO%: 5.4 % (ref 0.0–14.0)
NEUT#: 14 10*3/uL — ABNORMAL HIGH (ref 1.5–6.5)
NEUT%: 89.5 % — ABNORMAL HIGH (ref 39.0–75.0)
PLATELETS: 237 10*3/uL (ref 140–400)
RBC: 4.77 10*6/uL (ref 4.20–5.82)
RDW: 14.1 % (ref 11.0–14.6)
WBC: 15.7 10*3/uL — ABNORMAL HIGH (ref 4.0–10.3)

## 2016-05-10 LAB — COMPREHENSIVE METABOLIC PANEL
ALT: 22 U/L (ref 0–55)
ANION GAP: 10 meq/L (ref 3–11)
AST: 23 U/L (ref 5–34)
Albumin: 3.6 g/dL (ref 3.5–5.0)
Alkaline Phosphatase: 154 U/L — ABNORMAL HIGH (ref 40–150)
BUN: 17.3 mg/dL (ref 7.0–26.0)
CHLORIDE: 102 meq/L (ref 98–109)
CO2: 26 meq/L (ref 22–29)
Calcium: 10.1 mg/dL (ref 8.4–10.4)
Creatinine: 0.8 mg/dL (ref 0.7–1.3)
EGFR: 88 mL/min/{1.73_m2} — AB (ref >90)
GLUCOSE: 172 mg/dL — AB (ref 70–140)
Potassium: 4.1 mEq/L (ref 3.5–5.1)
Sodium: 138 mEq/L (ref 136–145)
Total Bilirubin: 0.38 mg/dL (ref 0.20–1.20)
Total Protein: 7.1 g/dL (ref 6.4–8.3)

## 2016-05-10 LAB — TSH: TSH: 2.685 m(IU)/L (ref 0.320–4.118)

## 2016-05-10 MED ORDER — DEXAMETHASONE SODIUM PHOSPHATE 10 MG/ML IJ SOLN
10.0000 mg | Freq: Once | INTRAMUSCULAR | Status: AC
Start: 2016-05-10 — End: 2016-05-10
  Administered 2016-05-10: 10 mg via INTRAVENOUS

## 2016-05-10 MED ORDER — SODIUM CHLORIDE 0.9 % IV SOLN
510.0000 mg | Freq: Once | INTRAVENOUS | Status: AC
  Administered 2016-05-10: 510 mg via INTRAVENOUS
  Filled 2016-05-10: qty 51

## 2016-05-10 MED ORDER — PALONOSETRON HCL INJECTION 0.25 MG/5ML
0.2500 mg | Freq: Once | INTRAVENOUS | Status: AC
  Administered 2016-05-10: 0.25 mg via INTRAVENOUS

## 2016-05-10 MED ORDER — SODIUM CHLORIDE 0.9 % IV SOLN
1005.0000 mg/m2/d | INTRAVENOUS | Status: DC
  Administered 2016-05-10: 8000 mg via INTRAVENOUS
  Filled 2016-05-10: qty 160

## 2016-05-10 MED ORDER — SODIUM CHLORIDE 0.9 % IV SOLN
Freq: Once | INTRAVENOUS | Status: AC
  Administered 2016-05-10: 12:00:00 via INTRAVENOUS

## 2016-05-10 MED ORDER — PALONOSETRON HCL INJECTION 0.25 MG/5ML
INTRAVENOUS | Status: AC
  Filled 2016-05-10: qty 5

## 2016-05-10 NOTE — Assessment & Plan Note (Signed)
He tolerated cycle 1 of treatment except for some mucositis which is self-limiting We will proceed with cycle 2 without dose adjustment My preference would be to give him 3 cycles of chemo before repeat imaging study

## 2016-05-10 NOTE — Progress Notes (Signed)
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 Biopsy recommended. No definite pharyngeal mass lesion identified by CT.       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 No other primary or metastatic cancer. 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      03/11/2016 PET scan    Resolution of hypermetabolic right cervical lymphadenopathy since prior study . No other hypermetabolic lymphadenopathy identified. New hypermetabolic lesion and caudate lobe of liver, highly suspicious for liver metastasis. Recommend liver protocol abdomen MRI without and with contrast for further evaluation. New patchy airspace disease in both lung apices with FDG uptake. 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 Recommend continued attention on follow-up imaging.       03/19/2016 Imaging    MRI showed 3.4 cm heterogeneously enhancing mass in caudate lobe of liver, consistent liver metastasis. No other sites of metastatic disease identified within the liver or abdomen      03/26/2016 Procedure    Ultrasound and fluoroscopically guided right internal jugular single lumen power port catheter insertion. Tip in the SVC/RA junction. Catheter ready for use      04/05/2016 Adverse Reaction    The patient developed cardiac arrest due to severe anaphylactic reaction to cetuximab      04/05/2016 - 04/06/2016 Hospital Admission    The patient was admitted briefly for observation due to CPR given after anaphylactic reaction to cetuximab      04/19/2016 -  Chemotherapy    The patient received cycle 1 of carboplatin 5-FU       Cancer of base of tongue ([place])    INTERVAL HISTORY: Please see below for problem oriented charting. He is seen in the infusion room prior to cycle 2 of treatment He had recent mild mucositis, resolved His chest wall pain from chest compression is resolving He denies recent nausea, vomiting or diarrhea He had recent episode of hypoglycemia, resolved with conservative management  REVIEW OF SYSTEMS:   Constitutional: Denies fevers, chills or abnormal weight loss Eyes: Denies blurriness of vision Respiratory: Denies cough, dyspnea or wheezes Cardiovascular: Denies palpitation, chest discomfort or lower extremity  swelling Gastrointestinal:  Denies nausea, heartburn or change in bowel habits Skin: Denies abnormal skin rashes Lymphatics: Denies new lymphadenopathy or easy bruising Neurological:Denies numbness, tingling or new weaknesses Behavioral/Psych: Mood is stable, no new changes  All other systems were reviewed with the patient and are negative.  I have  reviewed the past medical history, past surgical history, social history and family history with the patient and they are unchanged from previous note.  ALLERGIES:  is allergic to cetuximab.  MEDICATIONS:  Current Outpatient Prescriptions  Medication Sig Dispense Refill  . aspirin EC 81 MG tablet Take 81 mg by mouth at bedtime.     . clopidogrel (PLAVIX) 75 MG tablet TAKE 1 TABLET EVERY DAY 90 tablet 1  . fentaNYL (DURAGESIC - DOSED MCG/HR) 25 MCG/HR patch Place 1 patch (25 mcg total) onto the skin every 3 (three) days. 5 patch 0  . fluticasone (FLONASE) 50 MCG/ACT nasal spray Place 2 sprays into both nostrils at bedtime as needed for rhinitis.     [name] insulin glargine (LANTUS) 100 UNIT/ML injection Inject 100 Units into the skin at bedtime.     [name] ketoconazole (NIZORAL) 2 % shampoo Apply 1 application topically every 3 (three) days.    [name] lidocaine-prilocaine (EMLA) cream Apply 1 application topically once as needed (prior to accessing port).    . mirtazapine (REMERON) 15 MG tablet Take 1 tablet (15 mg total) by mouth at bedtime. 30 tablet 11  . morphine (MSIR) 15 MG tablet Take 1 tablet (15 mg total) by mouth every 4 (four) hours as needed for severe pain. 60 tablet 0  . Nutritional Supplements (FEEDING SUPPLEMENT, OSMOLITE 1.5 CAL,) LIQD Give 1 1/2 cans osmolite 1.5 via PEG QID with 60 cc free water before and after feedings.  In addition, flush tube with 240 cc free water TID between meals. 6 Bottle 0  . oxyCODONE-acetaminophen (PERCOCET/ROXICET) 5-325 MG tablet Take 1 tablet by mouth every 4 (four) hours as needed for severe pain. 18 tablet 0  . silver sulfADIAZINE (SILVADENE) 1 % cream Apply 1 application topically at bedtime. Pt applies to sacral ulceration area.    . simvastatin (ZOCOR) 20 MG tablet Take 1 tablet (20 mg total) by mouth at bedtime. 90 tablet 3  . sodium fluoride (FLUORISHIELD) 1.1 % GEL dental gel Instill one drop of gel per tooth space of fluoride tray. Place over teeth for 5  minutes. Remove. Spit out excess. Repeat nightly. 120 mL 11   No current facility-administered medications for this visit.    Facility-Administered Medications Ordered in Other Visits  Medication Dose Route Frequency Provider Last Rate Last Dose  . CARBOplatin (PARAPLATIN) 510 mg in sodium chloride 0.9 % 250 mL chemo infusion  510 mg Intravenous Once Heath Lark, MD      . fluorouracil (ADRUCIL) 8,000 mg in sodium chloride 0.9 % 90 mL chemo infusion  1,005 mg/m2/day (Treatment Plan Recorded) Intravenous 4 days Heath Lark, MD        PHYSICAL EXAMINATION: ECOG PERFORMANCE STATUS: 1 - Symptomatic but completely ambulatory GENERAL:alert, no distress and comfortable SKIN: skin color, texture, turgor are normal, no rashes or significant lesions EYES: normal, Conjunctiva are pink and non-injected, sclera clear OROPHARYNX:no exudate, no erythema and lips, buccal mucosa, and tongue normal  NECK: supple, thyroid normal size, non-tender, without nodularity LYMPH:  no palpable lymphadenopathy in the cervical, axillary or inguinal LUNGS: clear to auscultation and percussion with normal breathing effort HEART: regular rate & rhythm and no murmurs and no lower  extremity edema ABDOMEN:abdomen soft, non-tender and normal bowel sounds Musculoskeletal:no cyanosis of digits and no clubbing  NEURO: alert & oriented x 3 with fluent speech, no focal motor/sensory deficits  LABORATORY DATA:  I have reviewed the data as listed    Component Value Date/Time   NA 138 05/10/2016 1010   K 4.1 05/10/2016 1010   CL 99 (L) 04/12/2016 1211   CO2 26 05/10/2016 1010   GLUCOSE 172 (H) 05/10/2016 1010   BUN 17.3 05/10/2016 1010   CREATININE 0.8 05/10/2016 1010   CALCIUM 10.1 05/10/2016 1010   PROT 7.1 05/10/2016 1010   ALBUMIN 3.6 05/10/2016 1010   AST 23 05/10/2016 1010   ALT 22 05/10/2016 1010   ALKPHOS 154 (H) 05/10/2016 1010   BILITOT 0.38 05/10/2016 1010   GFRNONAA >60 04/05/2016 2014   GFRAA >60 04/05/2016  2014    No results found for: SPEP, UPEP  Lab Results  Component Value Date   WBC 15.7 (H) 05/10/2016   NEUTROABS 14.0 (H) 05/10/2016   HGB 13.6 05/10/2016   HCT 40.7 05/10/2016   MCV 85.3 05/10/2016   PLT 237 05/10/2016      Chemistry      Component Value Date/Time   NA 138 05/10/2016 1010   K 4.1 05/10/2016 1010   CL 99 (L) 04/12/2016 1211   CO2 26 05/10/2016 1010   BUN 17.3 05/10/2016 1010   CREATININE 0.8 05/10/2016 1010   GLU 78 11/13/2015 1134      Component Value Date/Time   CALCIUM 10.1 05/10/2016 1010   ALKPHOS 154 (H) 05/10/2016 1010   AST 23 05/10/2016 1010   ALT 22 05/10/2016 1010   BILITOT 0.38 05/10/2016 1010       RADIOGRAPHIC STUDIES: I have personally reviewed the radiological images as listed and agreed with the findings in the report. Dg Chest 2 View  Result Date: 04/12/2016 CLINICAL DATA:  Cough, chest congestion, atypical chest pain, the patient underwent recent CPR. Suspect rib fractures. History of head and neck malignancy and hepatic malignancy, diabetes, hypertension, coronary artery disease with stent placement. EXAM: CHEST  2 VIEW COMPARISON:  PA and lateral chest x-ray of May 22, 2015 and PET-CT study of March 11, 2016. FINDINGS: The lungs are well-expanded. There is a small right pleural effusion. There is no alveolar infiltrate or pleural effusion. No pulmonary parenchymal nodules or masses are observed. The heart and pulmonary vascularity are normal. The power port catheter tip projects over the midportion of the SVC. There is calcification in the wall of the aortic arch. The trachea is midline. The bony thorax exhibits no acute abnormality. IMPRESSION: New small right pleural effusion. No pulmonary parenchymal masses or nodules. No pneumonia nor CHF. Thoracic aortic atherosclerosis. Electronically Signed   By: David  [name] M.D.   On: 04/12/2016 10:07   Ct Chest W Contrast  Result Date: 04/12/2016 CLINICAL DATA:  Chest pain and cough  following spur recent CPR EXAM: CT CHEST WITH CONTRAST TECHNIQUE: Multidetector CT imaging of the chest was performed during intravenous contrast administration. CONTRAST:  37mL ISOVUE-300 IOPAMIDOL (ISOVUE-300) INJECTION 61% COMPARISON:  Chest x-ray from earlier in the same day, PET-CT from 03/11/2016 FINDINGS: Cardiovascular: Thoracic aorta demonstrates atherosclerotic calcifications without aneurysmal dilatation or dissection. Heavy coronary calcifications are noted throughout the coronary system. No significant cardiac enlargement is seen. No definitive pulmonary embolism is seen. Right chest wall port is noted. Mediastinum/Nodes: The thoracic inlet is within normal limits. No significant mediastinal or hilar adenopathy is seen. Lungs/Pleura: The  lungs are well aerated bilaterally. A new right-sided pleural effusion is seen. There is a nodular density in the left posterior costophrenic angle which is new from recent exams. This measures 6-7 mm and is best seen on image number 93 of series 5. Patchy changes remain in the upper lobes bilaterally again likely related to radiation pneumonitis. Mild left basilar atelectasis is seen related to the pleural effusion. Upper Abdomen: Right renal cyst is noted. There is an irregularly enhancing lesion within the caudate lobe of the liver again suspicious for metastatic disease. Musculoskeletal: Degenerative change of the thoracic spine is noted. Fractures of the right second third and fourth ribs are noted. This is likely related to the recent CPR and consistent with the patient's given clinical history of right chest pain. No pneumothorax is noted IMPRESSION: Right rib fractures with associated right pleural effusion this is likely the etiology of the patient's discomfort. No pneumothorax is noted. Caudate lobe lesion consistent with metastatic disease. Vague 6-7 mm nodule in the posterior left costophrenic angle. Metastatic disease would be difficult to exclude although  this was not well seen on a prior exam from 1 month ago. Short-term follow-up in 3 months is recommended. Electronically Signed   By: Inez Catalina M.D.   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 Potentially, it could be resectable in the future if he has excellent response to chemotherapy. His liver function tests is preserved  Mucositis due to chemotherapy He had recent mild mucositis, self-limiting Exam today show no evidence of persistent mucositis I would proceed with treatment without dose adjustment  Diabetes mellitus with diabetic neuropathy, with long-term current use of insulin ([place]) He has poorly controlled diabetes with recent hypoglycemia Most days, his blood sugar has been running high We discussed consistent oral intake to avoid hypoglycemia    No orders of the defined types were placed in this encounter.  All questions were answered. The patient knows to call the clinic with any problems, questions or concerns. No barriers to learning was detected. I spent 15 minutes counseling the patient face to face. The total time spent in the appointment was 20 minutes and more than 50% was on counseling and review of test results     Heath Lark, MD 05/10/2016 11:58 AM

## 2016-05-10 NOTE — Progress Notes (Signed)
[facility] Spiritual Care Note  Followed up with Acxel and wife Hilda Blades individually in infusion.  He was in good spirits, engaging in conversation and reflection, and noting that he feels much better than he has in a while ("almost back to normal," aside from lingering sx from xrt).  We even spoke in the abstract about funerals, ways of remembering people, and coping with loss.  Debra verbalized awareness that she may be deferring grief over her mother's death while she cares for Manjinder. She was also able to identify some ways that she is processing that big loss.   Will continue to follow for support, but please also page if immediate needs arise.  Thank you.  [place], [place], [facility] Pager [phone] Voicemail [phone]

## 2016-05-10 NOTE — Assessment & Plan Note (Signed)
He has poorly controlled diabetes with recent hypoglycemia Most days, his blood sugar has been running high We discussed consistent oral intake to avoid hypoglycemia

## 2016-05-10 NOTE — Assessment & Plan Note (Signed)
He had recent mild mucositis, self-limiting Exam today show no evidence of persistent mucositis I would proceed with treatment without dose adjustment

## 2016-05-10 NOTE — Progress Notes (Signed)
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 Weight decreased and documented as 178 pounds on April 2, down from 182 pounds March 15. Patient reports one incident blood glucose level being 38.  Wife called EMS and his blood sugar improved. Patient states he usually gives 6 cans Osmolite 1.5 via PEG without difficulty. He is not eating food by mouth but has started to begin sipping water. Verbalizes desire to discontinue feeding tube.  Nutrition diagnosis: Inadequate oral intake continues.  Intervention: Patient educated to continue 6 bottles Osmolite 1.5 daily via PEG. Educated patient to increase water intake by mouth and then begin adding in juices and other liquids as tolerated. Provided education on drinking one oral nutrition supplement before discontinuing one bottle of Osmolite 1.5 via tube. Questions were answered.  Teach back method used.  Monitoring, evaluation, goals: Patient will tolerate chemotherapy with minimal side effects and weight maintenance.  Next visit: April 23, during infusion.  **Disclaimer: This note was dictated with voice recognition software. Similar sounding words can inadvertently be transcribed and this note may contain transcription errors which may not have been corrected upon publication of note.**

## 2016-05-10 NOTE — Assessment & Plan Note (Signed)
He has isolated liver metastasis. Potentially, it could be resectable in the future if he has excellent response to chemotherapy. His liver function tests is preserved

## 2016-05-10 NOTE — Patient Instructions (Addendum)
[facility] Discharge Instructions for Patients Receiving Chemotherapy  Today you received the following chemotherapy agents:  Carboplatin and 5FU.  To help prevent nausea and vomiting after your treatment, we encourage you to take your nausea medication as directed.   If you develop nausea and vomiting that is not controlled by your nausea medication, call the clinic.   BELOW ARE SYMPTOMS THAT SHOULD BE REPORTED IMMEDIATELY:  *FEVER GREATER THAN 100.5 F  *CHILLS WITH OR WITHOUT FEVER  NAUSEA AND VOMITING THAT IS NOT CONTROLLED WITH YOUR NAUSEA MEDICATION  *UNUSUAL SHORTNESS OF BREATH  *UNUSUAL BRUISING OR BLEEDING  TENDERNESS IN MOUTH AND THROAT WITH OR WITHOUT PRESENCE OF ULCERS  *URINARY PROBLEMS  *BOWEL PROBLEMS  UNUSUAL RASH Items with * indicate a potential emergency and should be followed up as soon as possible.  Feel free to call the clinic you have any questions or concerns. The clinic phone number is [phone].  Please show the [facility] at check-in to the Emergency Department and triage nurse.  Return to the [facility] on Friday, 05/14/2016, @ 1:30 pm for pump disconnection.

## 2016-05-14 ENCOUNTER — Ambulatory Visit (HOSPITAL_BASED_OUTPATIENT_CLINIC_OR_DEPARTMENT_OTHER): Payer: Medicare Other

## 2016-05-14 VITALS — BP 120/68 | HR 93 | Temp 99.0°F | Resp 16

## 2016-05-14 DIAGNOSIS — C787 Secondary malignant neoplasm of liver and intrahepatic bile duct: Secondary | ICD-10-CM | POA: Diagnosis not present

## 2016-05-14 DIAGNOSIS — Z5189 Encounter for other specified aftercare: Secondary | ICD-10-CM

## 2016-05-14 DIAGNOSIS — R110 Nausea: Secondary | ICD-10-CM

## 2016-05-14 DIAGNOSIS — C01 Malignant neoplasm of base of tongue: Secondary | ICD-10-CM | POA: Diagnosis present

## 2016-05-14 MED ORDER — HEPARIN SOD (PORK) LOCK FLUSH 100 UNIT/ML IV SOLN
500.0000 [IU] | Freq: Once | INTRAVENOUS | Status: AC | PRN
  Administered 2016-05-14: 500 [IU]
  Filled 2016-05-14: qty 5

## 2016-05-14 MED ORDER — SODIUM CHLORIDE 0.9 % IJ SOLN
10.0000 mL | INTRAMUSCULAR | Status: DC | PRN
  Administered 2016-05-14: 10 mL
  Filled 2016-05-14: qty 10

## 2016-05-14 MED ORDER — PEGFILGRASTIM INJECTION 6 MG/0.6ML ~~LOC~~
6.0000 mg | PREFILLED_SYRINGE | Freq: Once | SUBCUTANEOUS | Status: AC
  Administered 2016-05-14: 6 mg via SUBCUTANEOUS
  Filled 2016-05-14: qty 0.6

## 2016-05-14 NOTE — Patient Instructions (Signed)
Implanted Port Insertion, Care After °This sheet gives you information about how to care for yourself after your procedure. Your health care provider may also give you more specific instructions. If you have problems or questions, contact your health care provider. °What can I expect after the procedure? °After your procedure, it is common to have: °· Discomfort at the port insertion site. °· Bruising on the skin over the port. This should improve over 3-4 days. ° °Follow these instructions at home: °Port care °· After your port is placed, you will get a manufacturer's information card. The card has information about your port. Keep this card with you at all times. °· Take care of the port as told by your health care provider. Ask your health care provider if you or a family member can get training for taking care of the port at home. A home health care nurse may also take care of the port. °· Make sure to remember what type of port you have. °Incision care °· Follow instructions from your health care provider about how to take care of your port insertion site. Make sure you: °? Wash your hands with soap and water before you change your bandage (dressing). If soap and water are not available, use hand sanitizer. °? Change your dressing as told by your health care provider. °? Leave stitches (sutures), skin glue, or adhesive strips in place. These skin closures may need to stay in place for 2 weeks or longer. If adhesive strip edges start to loosen and curl up, you may trim the loose edges. Do not remove adhesive strips completely unless your health care provider tells you to do that. °· Check your port insertion site every day for signs of infection. Check for: °? More redness, swelling, or pain. °? More fluid or blood. °? Warmth. °? Pus or a bad smell. °General instructions °· Do not take baths, swim, or use a hot tub until your health care provider approves. °· Do not lift anything that is heavier than 10 lb (4.5  kg) for a week, or as told by your health care provider. °· Ask your health care provider when it is okay to: °? Return to work or school. °? Resume usual physical activities or sports. °· Do not drive for 24 hours if you were given a medicine to help you relax (sedative). °· Take over-the-counter and prescription medicines only as told by your health care provider. °· Wear a medical alert bracelet in case of an emergency. This will tell any health care providers that you have a port. °· Keep all follow-up visits as told by your health care provider. This is important. °Contact a health care provider if: °· You cannot flush your port with saline as directed, or you cannot draw blood from the port. °· You have a fever or chills. °· You have more redness, swelling, or pain around your port insertion site. °· You have more fluid or blood coming from your port insertion site. °· Your port insertion site feels warm to the touch. °· You have pus or a bad smell coming from the port insertion site. °Get help right away if: °· You have chest pain or shortness of breath. °· You have bleeding from your port that you cannot control. °Summary °· Take care of the port as told by your health care provider. °· Change your dressing as told by your health care provider. °· Keep all follow-up visits as told by your health care provider. °  This information is not intended to replace advice given to you by your health care provider. Make sure you discuss any questions you have with your health care provider. °Document Released: 11/15/2012 Document Revised: 12/17/2015 Document Reviewed: 12/17/2015 °Elsevier Interactive Patient Education © 2017 [employer] ° °

## 2016-05-31 ENCOUNTER — Encounter: Payer: Self-pay | Admitting: General Practice

## 2016-05-31 ENCOUNTER — Ambulatory Visit (HOSPITAL_BASED_OUTPATIENT_CLINIC_OR_DEPARTMENT_OTHER): Payer: Medicare Other

## 2016-05-31 ENCOUNTER — Ambulatory Visit: Payer: Medicare Other

## 2016-05-31 ENCOUNTER — Ambulatory Visit: Payer: Medicare Other | Admitting: Nutrition

## 2016-05-31 ENCOUNTER — Encounter: Payer: Self-pay | Admitting: Nutrition

## 2016-05-31 ENCOUNTER — Telehealth: Payer: Self-pay | Admitting: Hematology and Oncology

## 2016-05-31 ENCOUNTER — Encounter: Payer: Self-pay | Admitting: Hematology and Oncology

## 2016-05-31 ENCOUNTER — Ambulatory Visit (HOSPITAL_BASED_OUTPATIENT_CLINIC_OR_DEPARTMENT_OTHER): Payer: Medicare Other | Admitting: Hematology and Oncology

## 2016-05-31 ENCOUNTER — Other Ambulatory Visit (HOSPITAL_BASED_OUTPATIENT_CLINIC_OR_DEPARTMENT_OTHER): Payer: Medicare Other

## 2016-05-31 VITALS — BP 124/67 | HR 98 | Temp 98.4°F | Resp 18 | Ht 68.0 in | Wt 181.1 lb

## 2016-05-31 DIAGNOSIS — Z5111 Encounter for antineoplastic chemotherapy: Secondary | ICD-10-CM

## 2016-05-31 DIAGNOSIS — C01 Malignant neoplasm of base of tongue: Secondary | ICD-10-CM

## 2016-05-31 DIAGNOSIS — E1140 Type 2 diabetes mellitus with diabetic neuropathy, unspecified: Secondary | ICD-10-CM

## 2016-05-31 DIAGNOSIS — C787 Secondary malignant neoplasm of liver and intrahepatic bile duct: Secondary | ICD-10-CM

## 2016-05-31 DIAGNOSIS — K521 Toxic gastroenteritis and colitis: Secondary | ICD-10-CM | POA: Insufficient documentation

## 2016-05-31 DIAGNOSIS — C029 Malignant neoplasm of tongue, unspecified: Secondary | ICD-10-CM

## 2016-05-31 DIAGNOSIS — C770 Secondary and unspecified malignant neoplasm of lymph nodes of head, face and neck: Secondary | ICD-10-CM

## 2016-05-31 DIAGNOSIS — K1231 Oral mucositis (ulcerative) due to antineoplastic therapy: Secondary | ICD-10-CM | POA: Diagnosis not present

## 2016-05-31 DIAGNOSIS — R110 Nausea: Secondary | ICD-10-CM

## 2016-05-31 DIAGNOSIS — Z794 Long term (current) use of insulin: Secondary | ICD-10-CM | POA: Diagnosis not present

## 2016-05-31 DIAGNOSIS — Z931 Gastrostomy status: Secondary | ICD-10-CM

## 2016-05-31 LAB — CBC WITH DIFFERENTIAL/PLATELET
BASO%: 0.3 % (ref 0.0–2.0)
Basophils Absolute: 0 10e3/uL (ref 0.0–0.1)
EOS%: 0.1 % (ref 0.0–7.0)
Eosinophils Absolute: 0 10e3/uL (ref 0.0–0.5)
HCT: 37.3 % — ABNORMAL LOW (ref 38.4–49.9)
HGB: 12.2 g/dL — ABNORMAL LOW (ref 13.0–17.1)
LYMPH%: 5.8 % — ABNORMAL LOW (ref 14.0–49.0)
MCH: 29.1 pg (ref 27.2–33.4)
MCHC: 32.7 g/dL (ref 32.0–36.0)
MCV: 89 fL (ref 79.3–98.0)
MONO#: 0.5 10e3/uL (ref 0.1–0.9)
MONO%: 5.1 % (ref 0.0–14.0)
NEUT#: 8.6 10e3/uL — ABNORMAL HIGH (ref 1.5–6.5)
NEUT%: 88.7 % — ABNORMAL HIGH (ref 39.0–75.0)
Platelets: 170 10e3/uL (ref 140–400)
RBC: 4.19 10e6/uL — ABNORMAL LOW (ref 4.20–5.82)
RDW: 19.5 % — ABNORMAL HIGH (ref 11.0–14.6)
WBC: 9.7 10e3/uL (ref 4.0–10.3)
lymph#: 0.6 10e3/uL — ABNORMAL LOW (ref 0.9–3.3)

## 2016-05-31 LAB — COMPREHENSIVE METABOLIC PANEL
ALBUMIN: 3.5 g/dL (ref 3.5–5.0)
ALT: 23 U/L (ref 0–55)
ANION GAP: 12 meq/L — AB (ref 3–11)
AST: 23 U/L (ref 5–34)
Alkaline Phosphatase: 124 U/L (ref 40–150)
BUN: 14.6 mg/dL (ref 7.0–26.0)
CALCIUM: 9.7 mg/dL (ref 8.4–10.4)
CHLORIDE: 101 meq/L (ref 98–109)
CO2: 27 mEq/L (ref 22–29)
CREATININE: 0.9 mg/dL (ref 0.7–1.3)
EGFR: 86 mL/min/{1.73_m2} — ABNORMAL LOW (ref >90)
Glucose: 262 mg/dl — ABNORMAL HIGH (ref 70–140)
POTASSIUM: 4 meq/L (ref 3.5–5.1)
Sodium: 139 mEq/L (ref 136–145)
Total Bilirubin: 0.4 mg/dL (ref 0.20–1.20)
Total Protein: 6.7 g/dL (ref 6.4–8.3)

## 2016-05-31 MED ORDER — SODIUM CHLORIDE 0.9% FLUSH
10.0000 mL | Freq: Once | INTRAVENOUS | Status: AC
  Administered 2016-05-31: 10 mL
  Filled 2016-05-31: qty 10

## 2016-05-31 MED ORDER — SODIUM CHLORIDE 0.9 % IV SOLN
1005.0000 mg/m2/d | INTRAVENOUS | Status: DC
  Administered 2016-05-31: 8000 mg via INTRAVENOUS
  Filled 2016-05-31: qty 160

## 2016-05-31 MED ORDER — DEXAMETHASONE SODIUM PHOSPHATE 10 MG/ML IJ SOLN
10.0000 mg | Freq: Once | INTRAMUSCULAR | Status: AC
  Administered 2016-05-31: 10 mg via INTRAVENOUS

## 2016-05-31 MED ORDER — PALONOSETRON HCL INJECTION 0.25 MG/5ML
0.2500 mg | Freq: Once | INTRAVENOUS | Status: AC
  Administered 2016-05-31: 0.25 mg via INTRAVENOUS

## 2016-05-31 MED ORDER — DEXAMETHASONE SODIUM PHOSPHATE 10 MG/ML IJ SOLN
INTRAMUSCULAR | Status: AC
  Filled 2016-05-31: qty 1

## 2016-05-31 MED ORDER — MAGIC MOUTHWASH W/LIDOCAINE: 5.0000 mL | Freq: Four times a day (QID) | ORAL | 9 refills | Status: DC

## 2016-05-31 MED ORDER — MORPHINE SULFATE 15 MG PO TABS: 15.0000 mg | ORAL_TABLET | ORAL | 0 refills | Status: DC | PRN

## 2016-05-31 MED ORDER — SODIUM CHLORIDE 0.9 % IV SOLN
510.0000 mg | Freq: Once | INTRAVENOUS | Status: AC
  Administered 2016-05-31: 510 mg via INTRAVENOUS
  Filled 2016-05-31: qty 51

## 2016-05-31 MED ORDER — PALONOSETRON HCL INJECTION 0.25 MG/5ML
INTRAVENOUS | Status: AC
  Filled 2016-05-31: qty 5

## 2016-05-31 MED ORDER — SODIUM CHLORIDE 0.9 % IV SOLN
Freq: Once | INTRAVENOUS | Status: AC
  Administered 2016-05-31: 11:00:00 via INTRAVENOUS

## 2016-05-31 MED FILL — MAGIC MW W/LIDO 1:1:1:3: 12 days supply | Qty: 240 | Fill #0

## 2016-05-31 MED FILL — MORPHINE SULFATE IR 15 MG T: 15 | 10 days supply | Qty: 60 | Fill #0

## 2016-05-31 NOTE — Assessment & Plan Note (Addendum)
He has poorly controlled diabetes with recent hypoglycemia Most days, his blood sugar has been running high Continue medical management

## 2016-05-31 NOTE — Patient Instructions (Signed)
[facility] Discharge Instructions for Patients Receiving Chemotherapy  Today you received the following chemotherapy agents Carboplatin and Adrucil   To help prevent nausea and vomiting after your treatment, we encourage you to take your nausea medication as directed. No Zofran for 3 days. Take Compazine instead.    If you develop nausea and vomiting that is not controlled by your nausea medication, call the clinic.   BELOW ARE SYMPTOMS THAT SHOULD BE REPORTED IMMEDIATELY:  *FEVER GREATER THAN 100.5 F  *CHILLS WITH OR WITHOUT FEVER  NAUSEA AND VOMITING THAT IS NOT CONTROLLED WITH YOUR NAUSEA MEDICATION  *UNUSUAL SHORTNESS OF BREATH  *UNUSUAL BRUISING OR BLEEDING  TENDERNESS IN MOUTH AND THROAT WITH OR WITHOUT PRESENCE OF ULCERS  *URINARY PROBLEMS  *BOWEL PROBLEMS  UNUSUAL RASH Items with * indicate a potential emergency and should be followed up as soon as possible.  Feel free to call the clinic you have any questions or concerns. The clinic phone number is [phone].  Please show the [facility] at check-in to the Emergency Department and triage nurse.

## 2016-05-31 NOTE — Assessment & Plan Note (Signed)
He has slight worsening mucositis with recent treatment Recommend IV fluid support for 2 days after chemotherapy is completed along with Magic mouthwash I refill his prescription pain medicine to take as needed

## 2016-05-31 NOTE — Assessment & Plan Note (Signed)
The drug-induced diarrhea was due to 5-FU I recommend Imodium as needed

## 2016-05-31 NOTE — Progress Notes (Signed)
[place] Spiritual Care Note  Followed up with Benjamin Barr and Benjamin Barr in infusion. They consistently verbalize appreciation for support and utilize opportunity well to process how they are coping.   Today Benjamin Barr addressed anger about grief, mets, and other layers; normalizing these feelings helped her make space to continue to process them in more detail. Benjamin Barr addressed grief/frustration/disappointment that liver met was not found until after completion of xrt, which has left significant side effects. We talked about grieving the old normal, and he addressed his own coping with mortality through changes in care plan and dx (reevaluating life expectancy, dreading major surgery, etc).  Even a brief encounter was helpful, affirming, and cathartic for them. Continuing to follow for support, but please also page if immediate needs arise. Thank you.   [place], [place], [facility] Pager [phone] Voicemail [phone]

## 2016-05-31 NOTE — Telephone Encounter (Signed)
Gave patient AVS and calender per 4/23 - unable to schedule appt due to availability - patient is aware an will call patient once appt is made

## 2016-05-31 NOTE — Progress Notes (Signed)
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 Biopsy recommended. No definite pharyngeal mass lesion identified by CT.       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 No other primary or metastatic cancer. 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      03/11/2016 PET scan    Resolution of hypermetabolic right cervical lymphadenopathy since prior study . No other hypermetabolic lymphadenopathy identified. New hypermetabolic lesion and caudate lobe of liver, highly suspicious for liver metastasis. Recommend liver protocol abdomen MRI without and with contrast for further evaluation. New patchy airspace disease in both lung apices with FDG uptake. 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 Recommend continued attention on follow-up imaging.       03/19/2016 Imaging    MRI showed 3.4 cm heterogeneously enhancing mass in caudate lobe of liver, consistent liver metastasis. No other sites of metastatic disease identified within the liver or abdomen      03/26/2016 Procedure    Ultrasound and fluoroscopically guided right internal jugular single lumen power port catheter insertion. Tip in the SVC/RA junction. Catheter ready for use      04/05/2016 Adverse Reaction    The patient developed cardiac arrest due to severe anaphylactic reaction to cetuximab      04/05/2016 - 04/06/2016 Hospital Admission    The patient was admitted briefly for observation due to CPR given after anaphylactic reaction to cetuximab      04/19/2016 -  Chemotherapy    The patient received cycle 1 of carboplatin 5-FU       Cancer of base of tongue ([place])    INTERVAL HISTORY: Please see below for problem oriented charting. He is seen prior to cycle 3 of chemotherapy He has significant mucositis lasting for almost a week after treatment with mild bleeding He did not call us The pain in mucositis subsequently resolve on his own He denies fever or chills He also has several days of diarrhea, resolved spontaneously Denies worsening peripheral neuropathy.  REVIEW OF SYSTEMS:   Constitutional: Denies fevers, chills or abnormal weight loss Eyes: Denies blurriness of vision Respiratory: Denies cough, dyspnea  or wheezes Cardiovascular: Denies palpitation, chest discomfort or lower extremity swelling Skin: Denies abnormal skin rashes Lymphatics: Denies new lymphadenopathy or easy bruising Neurological:Denies numbness, tingling or new weaknesses Behavioral/Psych: Mood is stable, no new changes  All other systems were reviewed with the patient and are negative.  I have reviewed the past  medical history, past surgical history, social history and family history with the patient and they are unchanged from previous note.  ALLERGIES:  is allergic to cetuximab.  MEDICATIONS:  Current Outpatient Prescriptions  Medication Sig Dispense Refill  . aspirin EC 81 MG tablet Take 81 mg by mouth at bedtime.     . clopidogrel (PLAVIX) 75 MG tablet TAKE 1 TABLET EVERY DAY 90 tablet 1  . fluticasone (FLONASE) 50 MCG/ACT nasal spray Place 2 sprays into both nostrils at bedtime as needed for rhinitis.     [name] insulin glargine (LANTUS) 100 UNIT/ML injection Inject 100 Units into the skin at bedtime.     [name] ketoconazole (NIZORAL) 2 % shampoo Apply 1 application topically every 3 (three) days.    [name] lidocaine-prilocaine (EMLA) cream Apply 1 application topically once as needed (prior to accessing port).    . mirtazapine (REMERON) 15 MG tablet Take 1 tablet (15 mg total) by mouth at bedtime. 30 tablet 11  . morphine (MSIR) 15 MG tablet Take 1 tablet (15 mg total) by mouth every 4 (four) hours as needed for severe pain. 60 tablet 0  . Nutritional Supplements (FEEDING SUPPLEMENT, OSMOLITE 1.5 CAL,) LIQD Give 1 1/2 cans osmolite 1.5 via PEG QID with 60 cc free water before and after feedings.  In addition, flush tube with 240 cc free water TID between meals. 6 Bottle 0  . silver sulfADIAZINE (SILVADENE) 1 % cream Apply 1 application topically at bedtime. Pt applies to sacral ulceration area.    . simvastatin (ZOCOR) 20 MG tablet Take 1 tablet (20 mg total) by mouth at bedtime. 90 tablet 3  . sodium fluoride (FLUORISHIELD) 1.1 % GEL dental gel Instill one drop of gel per tooth space of fluoride tray. Place over teeth for 5 minutes. Remove. Spit out excess. Repeat nightly. 120 mL 11  . fentaNYL (DURAGESIC - DOSED MCG/HR) 25 MCG/HR patch Place 1 patch (25 mcg total) onto the skin every 3 (three) days. (Patient not taking: Reported on 05/31/2016) 5 patch 0  . magic mouthwash w/lidocaine SOLN Take 5 mLs by mouth 4  (four) times daily. 240 mL 9  . oxyCODONE-acetaminophen (PERCOCET/ROXICET) 5-325 MG tablet Take 1 tablet by mouth every 4 (four) hours as needed for severe pain. (Patient not taking: Reported on 05/31/2016) 18 tablet 0   No current facility-administered medications for this visit.    Facility-Administered Medications Ordered in Other Visits  Medication Dose Route Frequency Provider Last Rate Last Dose  . CARBOplatin (PARAPLATIN) 510 mg in sodium chloride 0.9 % 250 mL chemo infusion  510 mg Intravenous Once Heath Lark, MD 301 mL/hr at 05/31/16 1221 510 mg at 05/31/16 1221  . fluorouracil (ADRUCIL) 8,000 mg in sodium chloride 0.9 % 90 mL chemo infusion  1,005 mg/m2/day (Treatment Plan Recorded) Intravenous 4 days Heath Lark, MD        PHYSICAL EXAMINATION: ECOG PERFORMANCE STATUS: 1 - Symptomatic but completely ambulatory  Vitals:   05/31/16 1027  BP: 124/67  Pulse: 98  Resp: 18  Temp: 98.4 F (36.9 C)   Filed Weights   05/31/16 1027  Weight: 181 lb 1.6 oz (82.1 kg)    GENERAL:alert, no  distress and comfortable SKIN: skin color, texture, turgor are normal, no rashes or significant lesions EYES: normal, Conjunctiva are pink and non-injected, sclera clear OROPHARYNX: Noted minimal mucositis.  No thrush NECK: supple, thyroid normal size, non-tender, without nodularity LYMPH:  no palpable lymphadenopathy in the cervical, axillary or inguinal LUNGS: clear to auscultation and percussion with normal breathing effort HEART: regular rate & rhythm and no murmurs and no lower extremity edema ABDOMEN:abdomen soft, non-tender and normal bowel sounds.  Feeding tube site looks okay Musculoskeletal:no cyanosis of digits and no clubbing  NEURO: alert & oriented x 3 with fluent speech, no focal motor/sensory deficits  LABORATORY DATA:  I have reviewed the data as listed    Component Value Date/Time   NA 139 05/31/2016 0911   K 4.0 05/31/2016 0911   CL 99 (L) 04/12/2016 1211   CO2 27  05/31/2016 0911   GLUCOSE 262 (H) 05/31/2016 0911   BUN 14.6 05/31/2016 0911   CREATININE 0.9 05/31/2016 0911   CALCIUM 9.7 05/31/2016 0911   PROT 6.7 05/31/2016 0911   ALBUMIN 3.5 05/31/2016 0911   AST 23 05/31/2016 0911   ALT 23 05/31/2016 0911   ALKPHOS 124 05/31/2016 0911   BILITOT 0.40 05/31/2016 0911   GFRNONAA >60 04/05/2016 2014   GFRAA >60 04/05/2016 2014    No results found for: SPEP, UPEP  Lab Results  Component Value Date   WBC 9.7 05/31/2016   NEUTROABS 8.6 (H) 05/31/2016   HGB 12.2 (L) 05/31/2016   HCT 37.3 (L) 05/31/2016   MCV 89.0 05/31/2016   PLT 170 05/31/2016      Chemistry      Component Value Date/Time   NA 139 05/31/2016 0911   K 4.0 05/31/2016 0911   CL 99 (L) 04/12/2016 1211   CO2 27 05/31/2016 0911   BUN 14.6 05/31/2016 0911   CREATININE 0.9 05/31/2016 0911   GLU 78 11/13/2015 1134      Component Value Date/Time   CALCIUM 9.7 05/31/2016 0911   ALKPHOS 124 05/31/2016 0911   AST 23 05/31/2016 0911   ALT 23 05/31/2016 0911   BILITOT 0.40 05/31/2016 0911      ASSESSMENT & PLAN:  Tongue cancer ([place]) The patient has significant mucositis with cycle 2 of chemotherapy We discussed the risk and benefit of dose adjustment versus proceed with cycle 3 with aggressive supportive care Ultimately, the patient elected to proceed with similar dosing with aggressive supportive care I plan to repeat imaging study before cycle 4 of treatment  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 Potentially, it could be resectable in the future if he has excellent response to chemotherapy. His liver function tests is preserved  S/P gastrostomy ([place]) The site is clean without signs of infection  Mucositis due to chemotherapy He has slight worsening mucositis with recent treatment Recommend IV fluid support for 2 days after chemotherapy is completed along with Magic mouthwash I refill his prescription pain medicine to take as needed  Diabetes  mellitus with diabetic neuropathy, with long-term current use of insulin ([place]) He has poorly controlled diabetes with recent hypoglycemia Most days, his blood sugar has been running high Continue medical management  Drug-induced diarrhea The drug-induced diarrhea was due to 5-FU I recommend Imodium as needed   Orders Placed This Encounter  Procedures  . NM PET Image Restag (PS) Skull Base To Thigh    Standing Status:   Future    Standing Expiration Date:   07/31/2017  Order Specific Question:   Reason for Exam (SYMPTOM  OR DIAGNOSIS REQUIRED)    Answer:   staging for metastatic tongue cancer to liver, assess response to chemo    Order Specific Question:   Preferred imaging location?    Answer:   [facility]   All questions were answered. The patient knows to call the clinic with any problems, questions or concerns. No barriers to learning was detected. I spent 30 minutes counseling the patient face to face. The total time spent in the appointment was 40 minutes and more than 50% was on counseling and review of test results     Heath Lark, MD 05/31/2016 12:58 PM

## 2016-05-31 NOTE — Assessment & Plan Note (Signed)
The site is clean without signs of infection

## 2016-05-31 NOTE — Progress Notes (Signed)
Brief nutrition follow-up completed with patient and wife, during chemotherapy for stage IV cancer. Weight improved slightly and documented as 181.1 pounds on April 23. Patient is unable to eat by mouth. He uses his feeding tube to give himself 6 cans Osmolite 1.5 daily. He continues to use water before and after each bolus feeding.  Nutrition diagnosis: Inadequate oral intake continues.  Intervention: Patient will continue Osmolite 1.5-6 bottles daily via PEG. Educated patient to try to increase oral intake as tolerated. Questions were answered.  Teach back method used.  Monitoring, evaluation, goals:  Patient will tolerate chemotherapy with minimal side effects and weight maintenance.  Next visit: To be scheduled as needed.  **Disclaimer: This note was dictated with voice recognition software. Similar sounding words can inadvertently be transcribed and this note may contain transcription errors which may not have been corrected upon publication of note.**

## 2016-05-31 NOTE — Assessment & Plan Note (Signed)
He has isolated liver metastasis. Potentially, it could be resectable in the future if he has excellent response to chemotherapy. His liver function tests is preserved

## 2016-05-31 NOTE — Assessment & Plan Note (Signed)
The patient has significant mucositis with cycle 2 of chemotherapy We discussed the risk and benefit of dose adjustment versus proceed with cycle 3 with aggressive supportive care Ultimately, the patient elected to proceed with similar dosing with aggressive supportive care I plan to repeat imaging study before cycle 4 of treatment

## 2016-06-01 ENCOUNTER — Telehealth: Payer: Self-pay | Admitting: Hematology and Oncology

## 2016-06-01 NOTE — Telephone Encounter (Signed)
Patient aware of appt time and date for 4/27. Called to confirm appt.

## 2016-06-02 ENCOUNTER — Other Ambulatory Visit: Payer: Self-pay | Admitting: Cardiology

## 2016-06-04 ENCOUNTER — Ambulatory Visit: Payer: Medicare Other

## 2016-06-04 ENCOUNTER — Ambulatory Visit (HOSPITAL_BASED_OUTPATIENT_CLINIC_OR_DEPARTMENT_OTHER): Payer: Medicare Other

## 2016-06-04 VITALS — BP 132/69 | HR 94 | Temp 99.1°F | Resp 17

## 2016-06-04 DIAGNOSIS — C770 Secondary and unspecified malignant neoplasm of lymph nodes of head, face and neck: Secondary | ICD-10-CM

## 2016-06-04 DIAGNOSIS — C787 Secondary malignant neoplasm of liver and intrahepatic bile duct: Secondary | ICD-10-CM | POA: Diagnosis not present

## 2016-06-04 DIAGNOSIS — C01 Malignant neoplasm of base of tongue: Secondary | ICD-10-CM | POA: Diagnosis not present

## 2016-06-04 DIAGNOSIS — C029 Malignant neoplasm of tongue, unspecified: Secondary | ICD-10-CM

## 2016-06-04 DIAGNOSIS — R110 Nausea: Secondary | ICD-10-CM

## 2016-06-04 MED ORDER — SODIUM CHLORIDE 0.9 % IJ SOLN
10.0000 mL | INTRAMUSCULAR | Status: DC | PRN
  Administered 2016-06-04: 10 mL
  Filled 2016-06-04: qty 10

## 2016-06-04 MED ORDER — PEGFILGRASTIM INJECTION 6 MG/0.6ML ~~LOC~~: 6.0000 mg | PREFILLED_SYRINGE | Freq: Once | SUBCUTANEOUS | Status: DC

## 2016-06-04 MED ORDER — HEPARIN SOD (PORK) LOCK FLUSH 100 UNIT/ML IV SOLN
500.0000 [IU] | Freq: Once | INTRAVENOUS | Status: AC | PRN
Start: 2016-06-04 — End: 2016-06-04
  Administered 2016-06-04: 500 [IU]
  Filled 2016-06-04: qty 5

## 2016-06-04 MED ORDER — SODIUM CHLORIDE 0.9 % IV SOLN
Freq: Once | INTRAVENOUS | Status: AC
  Administered 2016-06-04: 14:00:00 via INTRAVENOUS

## 2016-06-04 MED ORDER — PEGFILGRASTIM INJECTION 6 MG/0.6ML ~~LOC~~
6.0000 mg | PREFILLED_SYRINGE | Freq: Once | SUBCUTANEOUS | Status: AC
  Administered 2016-06-04: 6 mg via SUBCUTANEOUS
  Filled 2016-06-04: qty 0.6

## 2016-06-04 NOTE — Patient Instructions (Signed)
Dehydration, Adult Dehydration is a condition in which there is not enough fluid or water in the body. This happens when you lose more fluids than you take in. Important organs, such as the kidneys, brain, and heart, cannot function without a proper amount of fluids. Any loss of fluids from the body can lead to dehydration. Dehydration can range from mild to severe. This condition should be treated right away to prevent it from becoming severe. What are the causes? This condition may be caused by:  Vomiting.  Diarrhea.  Excessive sweating, such as from heat exposure or exercise.  Not drinking enough fluid, especially:  When ill.  While doing activity that requires a lot of energy.  Excessive urination.  Fever.  Infection.  Certain medicines, such as medicines that cause the body to lose excess fluid (diuretics).  Inability to access safe drinking water.  Reduced physical ability to get adequate water and food. What increases the risk? This condition is more likely to develop in people:  Who have a poorly controlled long-term (chronic) illness, such as diabetes, heart disease, or kidney disease.  Who are age 65 or older.  Who are disabled.  Who live in a place with high altitude.  Who play endurance sports. What are the signs or symptoms? Symptoms of mild dehydration may include:   Thirst.  Dry lips.  Slightly dry mouth.  Dry, warm skin.  Dizziness. Symptoms of moderate dehydration may include:   Very dry mouth.  Muscle cramps.  Dark urine. Urine may be the color of tea.  Decreased urine production.  Decreased tear production.  Heartbeat that is irregular or faster than normal (palpitations).  Headache.  Light-headedness, especially when you stand up from a sitting position.  Fainting (syncope). Symptoms of severe dehydration may include:   Changes in skin, such as:  Cold and clammy skin.  Blotchy (mottled) or pale skin.  Skin that does  not quickly return to normal after being lightly pinched and released (poor skin turgor).  Changes in body fluids, such as:  Extreme thirst.  No tear production.  Inability to sweat when body temperature is high, such as in hot weather.  Very little urine production.  Changes in vital signs, such as:  Weak pulse.  Pulse that is more than 100 beats a minute when sitting still.  Rapid breathing.  Low blood pressure.  Other changes, such as:  Sunken eyes.  Cold hands and feet.  Confusion.  Lack of energy (lethargy).  Difficulty waking up from sleep.  Short-term weight loss.  Unconsciousness. How is this diagnosed? This condition is diagnosed based on your symptoms and a physical exam. Blood and urine tests may be done to help confirm the diagnosis. How is this treated? Treatment for this condition depends on the severity. Mild or moderate dehydration can often be treated at home. Treatment should be started right away. Do not wait until dehydration becomes severe. Severe dehydration is an emergency and it needs to be treated in a hospital. Treatment for mild dehydration may include:   Drinking more fluids.  Replacing salts and minerals in your blood (electrolytes) that you may have lost. Treatment for moderate dehydration may include:   Drinking an oral rehydration solution (ORS). This is a drink that helps you replace fluids and electrolytes (rehydrate). It can be found at pharmacies and retail stores. Treatment for severe dehydration may include:   Receiving fluids through an IV tube.  Receiving an electrolyte solution through a feeding tube that is   passed through your nose and into your stomach (nasogastric tube, or NG tube).  Correcting any abnormalities in electrolytes.  Treating the underlying cause of dehydration. Follow these instructions at home:  If directed by your health care provider, drink an ORS:  Make an ORS by following instructions on the  package.  Start by drinking small amounts, about  cup (120 mL) every 5-10 minutes.  Slowly increase how much you drink until you have taken the amount recommended by your health care provider.  Drink enough clear fluid to keep your urine clear or pale yellow. If you were told to drink an ORS, finish the ORS first, then start slowly drinking other clear fluids. Drink fluids such as:  Water. Do not drink only water. Doing that can lead to having too little salt (sodium) in the body (hyponatremia).  Ice chips.  Fruit juice that you have added water to (diluted fruit juice).  Low-calorie sports drinks.  Avoid:  Alcohol.  Drinks that contain a lot of sugar. These include high-calorie sports drinks, fruit juice that is not diluted, and soda.  Caffeine.  Foods that are greasy or contain a lot of fat or sugar.  Take over-the-counter and prescription medicines only as told by your health care provider.  Do not take sodium tablets. This can lead to having too much sodium in the body (hypernatremia).  Eat foods that contain a healthy balance of electrolytes, such as bananas, oranges, potatoes, tomatoes, and spinach.  Keep all follow-up visits as told by your health care provider. This is important. Contact a health care provider if:  You have abdominal pain that:  Gets worse.  Stays in one area (localizes).  You have a rash.  You have a stiff neck.  You are more irritable than usual.  You are sleepier or more difficult to wake up than usual.  You feel weak or dizzy.  You feel very thirsty.  You have urinated only a small amount of very dark urine over 6-8 hours. Get help right away if:  You have symptoms of severe dehydration.  You cannot drink fluids without vomiting.  Your symptoms get worse with treatment.  You have a fever.  You have a severe headache.  You have vomiting or diarrhea that:  Gets worse.  Does not go away.  You have blood or green matter  (bile) in your vomit.  You have blood in your stool. This may cause stool to look black and tarry.  You have not urinated in 6-8 hours.  You faint.  Your heart rate while sitting still is over 100 beats a minute.  You have trouble breathing. This information is not intended to replace advice given to you by your health care provider. Make sure you discuss any questions you have with your health care provider. Document Released: 01/25/2005 Document Revised: 08/22/2015 Document Reviewed: 03/21/2015 Elsevier Interactive Patient Education  2017 [employer] Inc.  

## 2016-06-05 ENCOUNTER — Ambulatory Visit (HOSPITAL_BASED_OUTPATIENT_CLINIC_OR_DEPARTMENT_OTHER): Payer: Medicare Other

## 2016-06-05 VITALS — BP 127/64 | HR 96 | Temp 99.3°F | Resp 18

## 2016-06-05 DIAGNOSIS — R110 Nausea: Secondary | ICD-10-CM | POA: Diagnosis present

## 2016-06-05 DIAGNOSIS — C01 Malignant neoplasm of base of tongue: Secondary | ICD-10-CM | POA: Diagnosis not present

## 2016-06-05 MED ORDER — SODIUM CHLORIDE 0.9 % IJ SOLN
10.0000 mL | INTRAMUSCULAR | Status: DC | PRN
  Administered 2016-06-05: 10 mL
  Filled 2016-06-05: qty 10

## 2016-06-05 MED ORDER — HEPARIN SOD (PORK) LOCK FLUSH 100 UNIT/ML IV SOLN
500.0000 [IU] | Freq: Once | INTRAVENOUS | Status: AC | PRN
  Administered 2016-06-05: 500 [IU]
  Filled 2016-06-05: qty 5

## 2016-06-05 MED ORDER — SODIUM CHLORIDE 0.9 % IV SOLN
Freq: Once | INTRAVENOUS | Status: AC
  Administered 2016-06-05: 11:00:00 via INTRAVENOUS

## 2016-06-05 NOTE — Patient Instructions (Signed)
Dehydration, Adult Dehydration is when there is not enough fluid or water in your body. This happens when you lose more fluids than you take in. Dehydration can range from mild to very bad. It should be treated right away to keep it from getting very bad. Symptoms of mild dehydration may include:   Thirst.  Dry lips.  Slightly dry mouth.  Dry, warm skin.  Dizziness. Symptoms of moderate dehydration may include:   Very dry mouth.  Muscle cramps.  Dark pee (urine). Pee may be the color of tea.  Your body making less pee.  Your eyes making fewer tears.  Heartbeat that is uneven or faster than normal (palpitations).  Headache.  Light-headedness, especially when you stand up from sitting.  Fainting (syncope). Symptoms of very bad dehydration may include:   Changes in skin, such as:  Cold and clammy skin.  Blotchy (mottled) or pale skin.  Skin that does not quickly return to normal after being lightly pinched and let go (poor skin turgor).  Changes in body fluids, such as:  Feeling very thirsty.  Your eyes making fewer tears.  Not sweating when body temperature is high, such as in hot weather.  Your body making very little pee.  Changes in vital signs, such as:  Weak pulse.  Pulse that is more than 100 beats a minute when you are sitting still.  Fast breathing.  Low blood pressure.  Other changes, such as:  Sunken eyes.  Cold hands and feet.  Confusion.  Lack of energy (lethargy).  Trouble waking up from sleep.  Short-term weight loss.  Unconsciousness. Follow these instructions at home:  If told by your doctor, drink an ORS:  Make an ORS by using instructions on the package.  Start by drinking small amounts, about  cup (120 mL) every 5-10 minutes.  Slowly drink more until you have had the amount that your doctor said to have.  Drink enough clear fluid to keep your pee clear or pale yellow. If you were told to drink an ORS, finish the  ORS first, then start slowly drinking clear fluids. Drink fluids such as:  Water. Do not drink only water by itself. Doing that can make the salt (sodium) level in your body get too low (hyponatremia).  Ice chips.  Fruit juice that you have added water to (diluted).  Low-calorie sports drinks.  Avoid:  Alcohol.  Drinks that have a lot of sugar. These include high-calorie sports drinks, fruit juice that does not have water added, and soda.  Caffeine.  Foods that are greasy or have a lot of fat or sugar.  Take over-the-counter and prescription medicines only as told by your doctor.  Do not take salt tablets. Doing that can make the salt level in your body get too high (hypernatremia).  Eat foods that have minerals (electrolytes). Examples include bananas, oranges, potatoes, tomatoes, and spinach.  Keep all follow-up visits as told by your doctor. This is important. Contact a doctor if:  You have belly (abdominal) pain that:  Gets worse.  Stays in one area (localizes).  You have a rash.  You have a stiff neck.  You get angry or annoyed more easily than normal (irritability).  You are more sleepy than normal.  You have a harder time waking up than normal.  You feel:  Weak.  Dizzy.  Very thirsty.  You have peed (urinated) only a small amount of very dark pee during 6-8 hours. Get help right away if:  You   have symptoms of very bad dehydration.  You cannot drink fluids without throwing up (vomiting).  Your symptoms get worse with treatment.  You have a fever.  You have a very bad headache.  You are throwing up or having watery poop (diarrhea) and it:  Gets worse.  Does not go away.  You have blood or something green (bile) in your throw-up.  You have blood in your poop (stool). This may cause poop to look black and tarry.  You have not peed in 6-8 hours.  You pass out (faint).  Your heart rate when you are sitting still is more than 100 beats a  minute.  You have trouble breathing. This information is not intended to replace advice given to you by your health care provider. Make sure you discuss any questions you have with your health care provider. Document Released: 11/21/2008 Document Revised: 08/15/2015 Document Reviewed: 03/21/2015 Elsevier Interactive Patient Education  2017 [employer]vier Inc.  

## 2016-06-18 ENCOUNTER — Ambulatory Visit (HOSPITAL_COMMUNITY)
Admission: RE | Admit: 2016-06-18 | Discharge: 2016-06-18 | Disposition: A | Discharge status: Home / Self Care | Payer: Medicare Other | Source: Ambulatory Visit | Attending: Hematology and Oncology | Admitting: Hematology and Oncology

## 2016-06-18 DIAGNOSIS — Z7902 Long term (current) use of antithrombotics/antiplatelets: Secondary | ICD-10-CM | POA: Diagnosis not present

## 2016-06-18 DIAGNOSIS — Z79899 Other long term (current) drug therapy: Secondary | ICD-10-CM | POA: Insufficient documentation

## 2016-06-18 DIAGNOSIS — R600 Localized edema: Secondary | ICD-10-CM | POA: Insufficient documentation

## 2016-06-18 DIAGNOSIS — C787 Secondary malignant neoplasm of liver and intrahepatic bile duct: Secondary | ICD-10-CM | POA: Diagnosis not present

## 2016-06-18 DIAGNOSIS — Z7982 Long term (current) use of aspirin: Secondary | ICD-10-CM | POA: Insufficient documentation

## 2016-06-18 DIAGNOSIS — C01 Malignant neoplasm of base of tongue: Secondary | ICD-10-CM | POA: Insufficient documentation

## 2016-06-18 LAB — GLUCOSE, CAPILLARY: GLUCOSE-CAPILLARY: 72 mg/dL (ref 65–99)

## 2016-06-18 MED ORDER — FLUDEOXYGLUCOSE F - 18 (FDG) INJECTION: 8.7000 | Freq: Once | INTRAVENOUS | Status: DC | PRN

## 2016-06-21 ENCOUNTER — Ambulatory Visit: Payer: Medicare Other

## 2016-06-21 ENCOUNTER — Telehealth: Payer: Self-pay | Admitting: Hematology and Oncology

## 2016-06-21 ENCOUNTER — Encounter: Payer: Self-pay | Admitting: Hematology and Oncology

## 2016-06-21 ENCOUNTER — Ambulatory Visit (HOSPITAL_BASED_OUTPATIENT_CLINIC_OR_DEPARTMENT_OTHER): Payer: Medicare Other | Admitting: Hematology and Oncology

## 2016-06-21 ENCOUNTER — Other Ambulatory Visit (HOSPITAL_BASED_OUTPATIENT_CLINIC_OR_DEPARTMENT_OTHER): Payer: Medicare Other

## 2016-06-21 ENCOUNTER — Ambulatory Visit: Payer: Medicare Other | Admitting: Nutrition

## 2016-06-21 ENCOUNTER — Ambulatory Visit (HOSPITAL_BASED_OUTPATIENT_CLINIC_OR_DEPARTMENT_OTHER): Payer: Medicare Other

## 2016-06-21 DIAGNOSIS — C787 Secondary malignant neoplasm of liver and intrahepatic bile duct: Secondary | ICD-10-CM

## 2016-06-21 DIAGNOSIS — C770 Secondary and unspecified malignant neoplasm of lymph nodes of head, face and neck: Secondary | ICD-10-CM

## 2016-06-21 DIAGNOSIS — Z931 Gastrostomy status: Secondary | ICD-10-CM | POA: Diagnosis not present

## 2016-06-21 DIAGNOSIS — Z5111 Encounter for antineoplastic chemotherapy: Secondary | ICD-10-CM

## 2016-06-21 DIAGNOSIS — K1231 Oral mucositis (ulcerative) due to antineoplastic therapy: Secondary | ICD-10-CM | POA: Diagnosis not present

## 2016-06-21 DIAGNOSIS — L989 Disorder of the skin and subcutaneous tissue, unspecified: Secondary | ICD-10-CM | POA: Diagnosis not present

## 2016-06-21 DIAGNOSIS — Z95828 Presence of other vascular implants and grafts: Secondary | ICD-10-CM

## 2016-06-21 DIAGNOSIS — C029 Malignant neoplasm of tongue, unspecified: Secondary | ICD-10-CM

## 2016-06-21 LAB — COMPREHENSIVE METABOLIC PANEL
ALBUMIN: 3.6 g/dL (ref 3.5–5.0)
ALK PHOS: 121 U/L (ref 40–150)
ALT: 21 U/L (ref 0–55)
AST: 21 U/L (ref 5–34)
Anion Gap: 12 mEq/L — ABNORMAL HIGH (ref 3–11)
BILIRUBIN TOTAL: 0.48 mg/dL (ref 0.20–1.20)
BUN: 15.5 mg/dL (ref 7.0–26.0)
CO2: 27 mEq/L (ref 22–29)
Calcium: 9.6 mg/dL (ref 8.4–10.4)
Chloride: 101 mEq/L (ref 98–109)
Creatinine: 0.9 mg/dL (ref 0.7–1.3)
EGFR: 82 mL/min/{1.73_m2} — ABNORMAL LOW (ref >90)
GLUCOSE: 274 mg/dL — AB (ref 70–140)
Potassium: 3.9 mEq/L (ref 3.5–5.1)
SODIUM: 140 meq/L (ref 136–145)
TOTAL PROTEIN: 6.7 g/dL (ref 6.4–8.3)

## 2016-06-21 LAB — CBC WITH DIFFERENTIAL/PLATELET
BASO%: 0.2 % (ref 0.0–2.0)
Basophils Absolute: 0 10*3/uL (ref 0.0–0.1)
EOS%: 0.1 % (ref 0.0–7.0)
Eosinophils Absolute: 0 10*3/uL (ref 0.0–0.5)
HCT: 31.6 % — ABNORMAL LOW (ref 38.4–49.9)
HGB: 10.8 g/dL — ABNORMAL LOW (ref 13.0–17.1)
LYMPH%: 4 % — AB (ref 14.0–49.0)
MCH: 31.6 pg (ref 27.2–33.4)
MCHC: 34.2 g/dL (ref 32.0–36.0)
MCV: 92.4 fL (ref 79.3–98.0)
MONO#: 0.6 10*3/uL (ref 0.1–0.9)
MONO%: 5.9 % (ref 0.0–14.0)
NEUT#: 9 10*3/uL — ABNORMAL HIGH (ref 1.5–6.5)
NEUT%: 89.8 % — ABNORMAL HIGH (ref 39.0–75.0)
Platelets: 149 10*3/uL (ref 140–400)
RBC: 3.42 10*6/uL — AB (ref 4.20–5.82)
RDW: 24.7 % — ABNORMAL HIGH (ref 11.0–14.6)
WBC: 10 10*3/uL (ref 4.0–10.3)
lymph#: 0.4 10*3/uL — ABNORMAL LOW (ref 0.9–3.3)

## 2016-06-21 MED ORDER — PALONOSETRON HCL INJECTION 0.25 MG/5ML
0.2500 mg | Freq: Once | INTRAVENOUS | Status: AC
  Administered 2016-06-21: 0.25 mg via INTRAVENOUS

## 2016-06-21 MED ORDER — SODIUM CHLORIDE 0.9 % IV SOLN
510.0000 mg | Freq: Once | INTRAVENOUS | Status: AC
  Administered 2016-06-21: 510 mg via INTRAVENOUS
  Filled 2016-06-21: qty 51

## 2016-06-21 MED ORDER — DEXAMETHASONE SODIUM PHOSPHATE 10 MG/ML IJ SOLN
INTRAMUSCULAR | Status: AC
  Filled 2016-06-21: qty 1

## 2016-06-21 MED ORDER — SODIUM CHLORIDE 0.9% FLUSH
10.0000 mL | INTRAVENOUS | Status: DC | PRN
  Administered 2016-06-21: 10 mL via INTRAVENOUS
  Filled 2016-06-21: qty 10

## 2016-06-21 MED ORDER — SODIUM CHLORIDE 0.9 % IV SOLN
1005.0000 mg/m2/d | INTRAVENOUS | Status: DC
  Administered 2016-06-21: 8000 mg via INTRAVENOUS
  Filled 2016-06-21: qty 160

## 2016-06-21 MED ORDER — DEXAMETHASONE SODIUM PHOSPHATE 10 MG/ML IJ SOLN
10.0000 mg | Freq: Once | INTRAMUSCULAR | Status: AC
  Administered 2016-06-21: 10 mg via INTRAVENOUS

## 2016-06-21 MED ORDER — SODIUM CHLORIDE 0.9% FLUSH
10.0000 mL | INTRAVENOUS | Status: DC | PRN
  Filled 2016-06-21: qty 10

## 2016-06-21 MED ORDER — PALONOSETRON HCL INJECTION 0.25 MG/5ML
INTRAVENOUS | Status: AC
  Filled 2016-06-21: qty 5

## 2016-06-21 MED ORDER — SODIUM CHLORIDE 0.9 % IV SOLN
Freq: Once | INTRAVENOUS | Status: AC
  Administered 2016-06-21: 09:00:00 via INTRAVENOUS

## 2016-06-21 MED ORDER — HEPARIN SOD (PORK) LOCK FLUSH 100 UNIT/ML IV SOLN
500.0000 [IU] | Freq: Once | INTRAVENOUS | Status: DC | PRN
  Filled 2016-06-21: qty 5

## 2016-06-21 NOTE — Telephone Encounter (Signed)
Gave patient AVS and calender per 5/14 los  

## 2016-06-21 NOTE — Assessment & Plan Note (Signed)
He has complete response to treatment.  Monitor closely.

## 2016-06-21 NOTE — Assessment & Plan Note (Signed)
He had a hard superficial skin lesion near the groin. It is a subcutaneous lesion which I suspect is due to infection. He is not symptomatic. I recommend close observation. Hopefully it would resolve in time. Less likely, it could be due to malignancy but given his excellent response to treatment on PET CT scan, I favor infection for now.  He does not need antibiotic treatment

## 2016-06-21 NOTE — Progress Notes (Signed)
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 Biopsy recommended. No definite pharyngeal mass lesion identified by CT.       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 No other primary or metastatic cancer. 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      03/11/2016 PET scan    Resolution of hypermetabolic right cervical lymphadenopathy since prior study . No other hypermetabolic lymphadenopathy identified. New hypermetabolic lesion and caudate lobe of liver, highly suspicious for liver metastasis. Recommend liver protocol abdomen MRI without and with contrast for further evaluation. New patchy airspace disease in both lung apices with FDG uptake.  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 Recommend continued attention on follow-up imaging.       03/19/2016 Imaging    MRI showed 3.4 cm heterogeneously enhancing mass in caudate lobe of liver, consistent liver metastasis. No other sites of metastatic disease identified within the liver or abdomen      03/26/2016 Procedure    Ultrasound and fluoroscopically guided right internal jugular single lumen power port catheter insertion. Tip in the SVC/RA junction. 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 Metabolic response to therapy of caudate lobe liver metastasis. Low-level activity in this area is similar to the surrounding liver.  2. Hypermetabolism in the right inguinal crease without concurrent adenopathy. Especially given subtle surrounding edema, favor cellulitis. Consider physical exam correlation. 3. Lack of visualization of previously described 7 mm left lower lobe pulmonary nodule. 4. Diffuse marrow hypermetabolism, favoring stimulation by chemotherapy.       Cancer of base of tongue ([place])    INTERVAL HISTORY: Please see below for problem oriented charting. He returns  with his wife to review imaging study. He is due for cycle 4 of treatment. He had some mucositis with last treatment.  IV fluids has helped.  He declined using Magic mouthwash because of taste in the texture He tolerated treatment well. No neuropathy.  He has been using his feeding tube intermittently. He had no recent infection, fever or  chills.   REVIEW OF SYSTEMS:   Constitutional: Denies fevers, chills or abnormal weight loss Eyes: Denies blurriness of vision Respiratory: Denies cough, dyspnea or wheezes Cardiovascular: Denies palpitation, chest discomfort or lower extremity swelling Gastrointestinal:  Denies nausea, heartburn or change in bowel habits Skin: Denies abnormal skin rashes Lymphatics: Denies new lymphadenopathy or easy bruising Neurological:Denies numbness, tingling or new weaknesses Behavioral/Psych: Mood is stable, no new changes  All other systems were reviewed with the patient and are negative.  I have reviewed the past medical history, past surgical history, social history and family history with the patient and they are unchanged from previous note.  ALLERGIES:  is allergic to cetuximab.  MEDICATIONS:  Current Outpatient Prescriptions  Medication Sig Dispense Refill  . aspirin EC 81 MG tablet Take 81 mg by mouth at bedtime.     . clopidogrel (PLAVIX) 75 MG tablet TAKE 1 TABLET EVERY DAY 90 tablet 1  . fentaNYL (DURAGESIC - DOSED MCG/HR) 25 MCG/HR patch Place 1 patch (25 mcg total) onto the skin every 3 (three) days. (Patient not taking: Reported on 05/31/2016) 5 patch 0  . fluticasone (FLONASE) 50 MCG/ACT nasal spray Place 2 sprays into both nostrils at bedtime as needed for rhinitis.     [name] insulin glargine (LANTUS) 100 UNIT/ML injection Inject 100 Units into the skin at bedtime.     [name] ketoconazole (NIZORAL) 2 % shampoo Apply 1 application topically every 3 (three) days.    [name] lidocaine-prilocaine (EMLA) cream Apply 1 application topically once as needed (prior to accessing port).    . magic mouthwash w/lidocaine SOLN Take 5 mLs by mouth 4 (four) times daily. 240 mL 9  . metoprolol succinate (TOPROL-XL) 25 MG 24 hr tablet TAKE 1/2 TABLET EVERY DAY 30 tablet 5  . mirtazapine (REMERON) 15 MG tablet Take 1 tablet (15 mg total) by mouth at bedtime. 30 tablet 11  . morphine (MSIR) 15 MG tablet Take 1  tablet (15 mg total) by mouth every 4 (four) hours as needed for severe pain. 60 tablet 0  . Nutritional Supplements (FEEDING SUPPLEMENT, OSMOLITE 1.5 CAL,) LIQD Give 1 1/2 cans osmolite 1.5 via PEG QID with 60 cc free water before and after feedings.  In addition, flush tube with 240 cc free water TID between meals. 6 Bottle 0  . oxyCODONE-acetaminophen (PERCOCET/ROXICET) 5-325 MG tablet Take 1 tablet by mouth every 4 (four) hours as needed for severe pain. (Patient not taking: Reported on 05/31/2016) 18 tablet 0  . silver sulfADIAZINE (SILVADENE) 1 % cream Apply 1 application topically at bedtime. Pt applies to sacral ulceration area.    . simvastatin (ZOCOR) 20 MG tablet Take 1 tablet (20 mg total) by mouth at bedtime. 90 tablet 3  . sodium fluoride (FLUORISHIELD) 1.1 % GEL dental gel Instill one drop of gel per tooth space of fluoride tray. Place over teeth for 5 minutes. Remove. Spit out excess. Repeat nightly. 120 mL 11   No current facility-administered medications for this visit.    Facility-Administered Medications Ordered in Other Visits  Medication Dose Route Frequency Provider Last Rate Last Dose  . CARBOplatin (PARAPLATIN) 510 mg  in sodium chloride 0.9 % 250 mL chemo infusion  510 mg Intravenous Once Heath Lark, MD 301 mL/hr at 06/21/16 0954 510 mg at 06/21/16 0954  . fludeoxyglucose F - 18 (FDG) injection 8.7 millicurie  8.7 millicurie Intravenous Once PRN Rolm Baptise, MD      . fluorouracil (ADRUCIL) 8,000 mg in sodium chloride 0.9 % 90 mL chemo infusion  1,005 mg/m2/day (Treatment Plan Recorded) Intravenous 4 days Alvy Bimler, Saquoia Sianez, MD      . heparin lock flush 100 unit/mL  500 Units Intracatheter Once PRN Alvy Bimler, Arriyana Rodell, MD      . sodium chloride flush (NS) 0.9 % injection 10 mL  10 mL Intravenous PRN Alvy Bimler, Lamiya Naas, MD   10 mL at 06/21/16 0819  . sodium chloride flush (NS) 0.9 % injection 10 mL  10 mL Intracatheter PRN Alvy Bimler, Jamaar Howes, MD        PHYSICAL EXAMINATION: ECOG PERFORMANCE STATUS: 1  - Symptomatic but completely ambulatory  Vitals:   06/21/16 0839  BP: (!) 120/53  Pulse: 97  Resp: 18  Temp: 98.9 F (37.2 C)   Filed Weights   06/21/16 0839  Weight: 182 lb 3.2 oz (82.6 kg)    GENERAL:alert, no distress and comfortable SKIN: He had a small subcutaneous lesion in the right groin.  It does not bother him EYES: normal, Conjunctiva are pink and non-injected, sclera clear OROPHARYNX: Dry mucous membrane noted.  No thrush NECK: supple, thyroid normal size, non-tender, without nodularity LYMPH:  no palpable lymphadenopathy in the cervical, axillary or inguinal LUNGS: clear to auscultation and percussion with normal breathing effort HEART: regular rate & rhythm and no murmurs and no lower extremity edema ABDOMEN:abdomen soft, non-tender and normal bowel sounds Musculoskeletal:no cyanosis of digits and no clubbing  NEURO: alert & oriented x 3 with fluent speech, no focal motor/sensory deficits  LABORATORY DATA:  I have reviewed the data as listed    Component Value Date/Time   NA 140 06/21/2016 0753   K 3.9 06/21/2016 0753   CL 99 (L) 04/12/2016 1211   CO2 27 06/21/2016 0753   GLUCOSE 274 (H) 06/21/2016 0753   BUN 15.5 06/21/2016 0753   CREATININE 0.9 06/21/2016 0753   CALCIUM 9.6 06/21/2016 0753   PROT 6.7 06/21/2016 0753   ALBUMIN 3.6 06/21/2016 0753   AST 21 06/21/2016 0753   ALT 21 06/21/2016 0753   ALKPHOS 121 06/21/2016 0753   BILITOT 0.48 06/21/2016 0753   GFRNONAA >60 04/05/2016 2014   GFRAA >60 04/05/2016 2014    No results found for: SPEP, UPEP  Lab Results  Component Value Date   WBC 10.0 06/21/2016   NEUTROABS 9.0 (H) 06/21/2016   HGB 10.8 (L) 06/21/2016   HCT 31.6 (L) 06/21/2016   MCV 92.4 06/21/2016   PLT 149 06/21/2016      Chemistry      Component Value Date/Time   NA 140 06/21/2016 0753   K 3.9 06/21/2016 0753   CL 99 (L) 04/12/2016 1211   CO2 27 06/21/2016 0753   BUN 15.5 06/21/2016 0753   CREATININE 0.9 06/21/2016 0753    GLU 78 11/13/2015 1134      Component Value Date/Time   CALCIUM 9.6 06/21/2016 0753   ALKPHOS 121 06/21/2016 0753   AST 21 06/21/2016 0753   ALT 21 06/21/2016 0753   BILITOT 0.48 06/21/2016 0753       RADIOGRAPHIC STUDIES: I have personally reviewed the radiological images as listed and agreed with the findings in the report.  Nm Pet Image Restag (ps) Skull Base To Thigh  Result Date: 06/18/2016 CLINICAL DATA:  Subsequent treatment strategy for restaging of metastatic tongue cancer. Liver metastasis. EXAM: NUCLEAR MEDICINE PET SKULL BASE TO THIGH TECHNIQUE: 8.7 MCi F-18 FDG was injected intravenously. Full-ring PET imaging was performed from the skull base to thigh after the radiotracer. CT data was obtained and used for attenuation correction and anatomic localization. FASTING BLOOD GLUCOSE:  Value: 72 mg/dl COMPARISON:  PET of 03/11/2016. Chest CT of 04/12/2016. Abdominal MRI of 03/17/2016. FINDINGS: NECK No cervical nodal or mucosal hypermetabolism identified. No cervical adenopathy. Dense bilateral carotid atherosclerosis. CHEST No thoracic nodal or pulmonary parenchymal hypermetabolism identified. Right-sided Port-A-Cath which terminates at the high right atrium. Mild cardiomegaly with multivessel coronary artery atherosclerosis. The left lower lobe 7 mm nodule described on the prior diagnostic CT is not readily apparent. Resolved right-sided pleural effusion. ABDOMEN/PELVIS Caudate lobe lesion measures on the order of 2.9 x 2.1 cm and is not significantly hypermetabolic to the remainder of the liver (i.e. a S.U.V. max of 3.9). This is relatively similar in size and significantly hypermetabolic on the prior. No new hypermetabolic liver lesions. Hypermetabolism which is right likely reactive corresponding to gastrostomy tube. A focus of hypermetabolism is identified about the right inguinal crease. This measures a S.U.V. max of 7.6, including on approximately image 202/series 4. No node in this  area. Suspect mild subcutaneous edema immediately adjacent to this crease, new. Normal adrenal glands. Gallstones. Right renal cyst. Nonobstructive 3.2 cm lipoma in the ascending colon. Abdominal aortic atherosclerosis. Moderate prostatomegaly. SKELETON There is relatively diffuse marrow hypermetabolism. No focal abnormality identified. Right iliac sclerotic lesion is likely a bone island. Remote right rib trauma. IMPRESSION: 1. Metabolic response to therapy of caudate lobe liver metastasis. Low-level activity in this area is similar to the surrounding liver. 2. Hypermetabolism in the right inguinal crease without concurrent adenopathy. Especially given subtle surrounding edema, favor cellulitis. Consider physical exam correlation. 3. Lack of visualization of previously described 7 mm left lower lobe pulmonary nodule. 4. Diffuse marrow hypermetabolism, favoring stimulation by chemotherapy. Electronically Signed   By: Abigail Miyamoto M.D.   On: 06/18/2016 13:41    ASSESSMENT & PLAN:  Tongue cancer ([facility]) The patient has significant mucositis with chemotherapy The patient has excellent response to treatment. The mucositis is manageable. I recommend we proceed with cycle 4 without dose adjustment.  The plan would be to complete 6 cycles of treatment. 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  Monitor closely.  Mucositis due to chemotherapy He has recurrence mucositis.  He was prescribed Magic mouthwash but he declined using it.  Continue conservative management   S/P gastrostomy ([place]) The patient is not using his feeding tube much but does use it once in a while when he had significant mucositis. I recommend he keeps his feeding tube until his treatment is completed.  Skin lesion,  superficial He had a hard superficial skin lesion near the groin. It is a subcutaneous lesion which I suspect is due to infection. He is not symptomatic. I recommend close observation. Hopefully it would resolve in time. Less likely, it could be due to malignancy but given his excellent response to treatment on PET CT scan, I favor infection for now.  He does not need  antibiotic treatment   No orders of the defined types were placed in this encounter.  All questions were answered. The patient knows to call the clinic with any problems, questions or concerns. No barriers to learning was detected. I spent 25 minutes counseling the patient face to face. The total time spent in the appointment was 40 minutes and more than 50% was on counseling and review of test results     Heath Lark, MD 06/21/2016 10:17 AM

## 2016-06-21 NOTE — Assessment & Plan Note (Signed)
The patient is not using his feeding tube much but does use it once in a while when he had significant mucositis. I recommend he keeps his feeding tube until his treatment is completed.

## 2016-06-21 NOTE — Assessment & Plan Note (Signed)
He has recurrence mucositis.  He was prescribed Magic mouthwash but he declined using it.  Continue conservative management

## 2016-06-21 NOTE — Assessment & Plan Note (Signed)
The patient has significant mucositis with chemotherapy The patient has excellent response to treatment. The mucositis is manageable. I recommend we proceed with cycle 4 without dose adjustment.  The plan would be to complete 6 cycles of treatment. I will not recommend pursuing surgery because he had complete response to treatment and the recent resolution of lung nodule could indicate more diffuse spread of disease then we originally is aware

## 2016-06-21 NOTE — Patient Instructions (Signed)
[facility] Discharge Instructions for Patients Receiving Chemotherapy  Today you received the following chemotherapy agents Carboplatin and Adrucil   To help prevent nausea and vomiting after your treatment, we encourage you to take your nausea medication as directed. No Zofran for 3 days. Take Compazine instead.    If you develop nausea and vomiting that is not controlled by your nausea medication, call the clinic.   BELOW ARE SYMPTOMS THAT SHOULD BE REPORTED IMMEDIATELY:  *FEVER GREATER THAN 100.5 F  *CHILLS WITH OR WITHOUT FEVER  NAUSEA AND VOMITING THAT IS NOT CONTROLLED WITH YOUR NAUSEA MEDICATION  *UNUSUAL SHORTNESS OF BREATH  *UNUSUAL BRUISING OR BLEEDING  TENDERNESS IN MOUTH AND THROAT WITH OR WITHOUT PRESENCE OF ULCERS  *URINARY PROBLEMS  *BOWEL PROBLEMS  UNUSUAL RASH Items with * indicate a potential emergency and should be followed up as soon as possible.  Feel free to call the clinic you have any questions or concerns. The clinic phone number is [phone].  Please show the [facility] at check-in to the Emergency Department and triage nurse.

## 2016-06-21 NOTE — Progress Notes (Signed)
Brief nutrition follow-up completed with patient and wife, during chemotherapy for stage IV cancer. Weight is stable and documented as 182.2 pounds on May 14. Patient uses his feeding tube to give 6 cans Osmolite 1.5 daily. Patient denies nutrition impact symptoms.  Nutrition diagnosis: Inadequate oral intake continues.  Intervention: Patient will continue tube feeding to meet adequate calories and protein for minimal side effects and weight maintenance.  Next visit: June 25, during infusion.  **Disclaimer: This note was dictated with voice recognition software. Similar sounding words can inadvertently be transcribed and this note may contain transcription errors which may not have been corrected upon publication of note.**

## 2016-06-25 ENCOUNTER — Ambulatory Visit: Payer: Medicare Other

## 2016-06-25 ENCOUNTER — Ambulatory Visit (HOSPITAL_BASED_OUTPATIENT_CLINIC_OR_DEPARTMENT_OTHER): Payer: Medicare Other

## 2016-06-25 ENCOUNTER — Other Ambulatory Visit: Payer: Self-pay | Admitting: *Deleted

## 2016-06-25 VITALS — BP 120/67 | HR 97 | Temp 99.1°F | Resp 18

## 2016-06-25 DIAGNOSIS — R110 Nausea: Secondary | ICD-10-CM

## 2016-06-25 DIAGNOSIS — C01 Malignant neoplasm of base of tongue: Secondary | ICD-10-CM

## 2016-06-25 MED ORDER — SODIUM CHLORIDE 0.9 % IV SOLN
Freq: Once | INTRAVENOUS | Status: AC
  Administered 2016-06-25: 11:00:00 via INTRAVENOUS

## 2016-06-25 MED ORDER — PEGFILGRASTIM INJECTION 6 MG/0.6ML ~~LOC~~
6.0000 mg | PREFILLED_SYRINGE | Freq: Once | SUBCUTANEOUS | Status: AC
  Administered 2016-06-25: 6 mg via SUBCUTANEOUS
  Filled 2016-06-25: qty 0.6

## 2016-06-25 MED ORDER — HEPARIN SOD (PORK) LOCK FLUSH 100 UNIT/ML IV SOLN
500.0000 [IU] | Freq: Once | INTRAVENOUS | Status: AC | PRN
  Administered 2016-06-25: 500 [IU]
  Filled 2016-06-25: qty 5

## 2016-06-25 MED ORDER — SODIUM CHLORIDE 0.9 % IJ SOLN
10.0000 mL | INTRAMUSCULAR | Status: DC | PRN
  Administered 2016-06-25: 10 mL
  Filled 2016-06-25: qty 10

## 2016-06-25 NOTE — Patient Instructions (Signed)
Dehydration, Adult Dehydration is when there is not enough fluid or water in your body. This happens when you lose more fluids than you take in. Dehydration can range from mild to very bad. It should be treated right away to keep it from getting very bad. Symptoms of mild dehydration may include:   Thirst.  Dry lips.  Slightly dry mouth.  Dry, warm skin.  Dizziness. Symptoms of moderate dehydration may include:   Very dry mouth.  Muscle cramps.  Dark pee (urine). Pee may be the color of tea.  Your body making less pee.  Your eyes making fewer tears.  Heartbeat that is uneven or faster than normal (palpitations).  Headache.  Light-headedness, especially when you stand up from sitting.  Fainting (syncope). Symptoms of very bad dehydration may include:   Changes in skin, such as:  Cold and clammy skin.  Blotchy (mottled) or pale skin.  Skin that does not quickly return to normal after being lightly pinched and let go (poor skin turgor).  Changes in body fluids, such as:  Feeling very thirsty.  Your eyes making fewer tears.  Not sweating when body temperature is high, such as in hot weather.  Your body making very little pee.  Changes in vital signs, such as:  Weak pulse.  Pulse that is more than 100 beats a minute when you are sitting still.  Fast breathing.  Low blood pressure.  Other changes, such as:  Sunken eyes.  Cold hands and feet.  Confusion.  Lack of energy (lethargy).  Trouble waking up from sleep.  Short-term weight loss.  Unconsciousness. Follow these instructions at home:  If told by your doctor, drink an ORS:  Make an ORS by using instructions on the package.  Start by drinking small amounts, about  cup (120 mL) every 5-10 minutes.  Slowly drink more until you have had the amount that your doctor said to have.  Drink enough clear fluid to keep your pee clear or pale yellow. If you were told to drink an ORS, finish the  ORS first, then start slowly drinking clear fluids. Drink fluids such as:  Water. Do not drink only water by itself. Doing that can make the salt (sodium) level in your body get too low (hyponatremia).  Ice chips.  Fruit juice that you have added water to (diluted).  Low-calorie sports drinks.  Avoid:  Alcohol.  Drinks that have a lot of sugar. These include high-calorie sports drinks, fruit juice that does not have water added, and soda.  Caffeine.  Foods that are greasy or have a lot of fat or sugar.  Take over-the-counter and prescription medicines only as told by your doctor.  Do not take salt tablets. Doing that can make the salt level in your body get too high (hypernatremia).  Eat foods that have minerals (electrolytes). Examples include bananas, oranges, potatoes, tomatoes, and spinach.  Keep all follow-up visits as told by your doctor. This is important. Contact a doctor if:  You have belly (abdominal) pain that:  Gets worse.  Stays in one area (localizes).  You have a rash.  You have a stiff neck.  You get angry or annoyed more easily than normal (irritability).  You are more sleepy than normal.  You have a harder time waking up than normal.  You feel:  Weak.  Dizzy.  Very thirsty.  You have peed (urinated) only a small amount of very dark pee during 6-8 hours. Get help right away if:  You   have symptoms of very bad dehydration.  You cannot drink fluids without throwing up (vomiting).  Your symptoms get worse with treatment.  You have a fever.  You have a very bad headache.  You are throwing up or having watery poop (diarrhea) and it:  Gets worse.  Does not go away.  You have blood or something green (bile) in your throw-up.  You have blood in your poop (stool). This may cause poop to look black and tarry.  You have not peed in 6-8 hours.  You pass out (faint).  Your heart rate when you are sitting still is more than 100 beats a  minute.  You have trouble breathing. This information is not intended to replace advice given to you by your health care provider. Make sure you discuss any questions you have with your health care provider. Document Released: 11/21/2008 Document Revised: 08/15/2015 Document Reviewed: 03/21/2015 Elsevier Interactive Patient Education  2017 [employer]vier Inc.  

## 2016-06-26 ENCOUNTER — Ambulatory Visit (HOSPITAL_BASED_OUTPATIENT_CLINIC_OR_DEPARTMENT_OTHER): Payer: Medicare Other

## 2016-06-26 VITALS — BP 119/58 | HR 94 | Temp 99.0°F | Resp 18

## 2016-06-26 DIAGNOSIS — C029 Malignant neoplasm of tongue, unspecified: Secondary | ICD-10-CM | POA: Diagnosis present

## 2016-06-26 MED ORDER — SODIUM CHLORIDE 0.9 % IJ SOLN
10.0000 mL | Freq: Once | INTRAMUSCULAR | Status: AC
  Administered 2016-06-26: 10 mL via INTRAVENOUS
  Filled 2016-06-26: qty 10

## 2016-06-26 MED ORDER — SODIUM CHLORIDE 0.9 % IV SOLN
INTRAVENOUS | Status: AC
  Administered 2016-06-26: 09:00:00 via INTRAVENOUS

## 2016-06-26 MED ORDER — HEPARIN SOD (PORK) LOCK FLUSH 100 UNIT/ML IV SOLN
500.0000 [IU] | Freq: Once | INTRAVENOUS | Status: AC
  Administered 2016-06-26: 500 [IU] via INTRAVENOUS
  Filled 2016-06-26: qty 5

## 2016-06-26 NOTE — Patient Instructions (Signed)
Dehydration, Adult Dehydration is when there is not enough fluid or water in your body. This happens when you lose more fluids than you take in. Dehydration can range from mild to very bad. It should be treated right away to keep it from getting very bad. Symptoms of mild dehydration may include:   Thirst.  Dry lips.  Slightly dry mouth.  Dry, warm skin.  Dizziness. Symptoms of moderate dehydration may include:   Very dry mouth.  Muscle cramps.  Dark pee (urine). Pee may be the color of tea.  Your body making less pee.  Your eyes making fewer tears.  Heartbeat that is uneven or faster than normal (palpitations).  Headache.  Light-headedness, especially when you stand up from sitting.  Fainting (syncope). Symptoms of very bad dehydration may include:   Changes in skin, such as:  Cold and clammy skin.  Blotchy (mottled) or pale skin.  Skin that does not quickly return to normal after being lightly pinched and let go (poor skin turgor).  Changes in body fluids, such as:  Feeling very thirsty.  Your eyes making fewer tears.  Not sweating when body temperature is high, such as in hot weather.  Your body making very little pee.  Changes in vital signs, such as:  Weak pulse.  Pulse that is more than 100 beats a minute when you are sitting still.  Fast breathing.  Low blood pressure.  Other changes, such as:  Sunken eyes.  Cold hands and feet.  Confusion.  Lack of energy (lethargy).  Trouble waking up from sleep.  Short-term weight loss.  Unconsciousness. Follow these instructions at home:  If told by your doctor, drink an ORS:  Make an ORS by using instructions on the package.  Start by drinking small amounts, about  cup (120 mL) every 5-10 minutes.  Slowly drink more until you have had the amount that your doctor said to have.  Drink enough clear fluid to keep your pee clear or pale yellow. If you were told to drink an ORS, finish the  ORS first, then start slowly drinking clear fluids. Drink fluids such as:  Water. Do not drink only water by itself. Doing that can make the salt (sodium) level in your body get too low (hyponatremia).  Ice chips.  Fruit juice that you have added water to (diluted).  Low-calorie sports drinks.  Avoid:  Alcohol.  Drinks that have a lot of sugar. These include high-calorie sports drinks, fruit juice that does not have water added, and soda.  Caffeine.  Foods that are greasy or have a lot of fat or sugar.  Take over-the-counter and prescription medicines only as told by your doctor.  Do not take salt tablets. Doing that can make the salt level in your body get too high (hypernatremia).  Eat foods that have minerals (electrolytes). Examples include bananas, oranges, potatoes, tomatoes, and spinach.  Keep all follow-up visits as told by your doctor. This is important. Contact a doctor if:  You have belly (abdominal) pain that:  Gets worse.  Stays in one area (localizes).  You have a rash.  You have a stiff neck.  You get angry or annoyed more easily than normal (irritability).  You are more sleepy than normal.  You have a harder time waking up than normal.  You feel:  Weak.  Dizzy.  Very thirsty.  You have peed (urinated) only a small amount of very dark pee during 6-8 hours. Get help right away if:  You   have symptoms of very bad dehydration.  You cannot drink fluids without throwing up (vomiting).  Your symptoms get worse with treatment.  You have a fever.  You have a very bad headache.  You are throwing up or having watery poop (diarrhea) and it:  Gets worse.  Does not go away.  You have blood or something green (bile) in your throw-up.  You have blood in your poop (stool). This may cause poop to look black and tarry.  You have not peed in 6-8 hours.  You pass out (faint).  Your heart rate when you are sitting still is more than 100 beats a  minute.  You have trouble breathing. This information is not intended to replace advice given to you by your health care provider. Make sure you discuss any questions you have with your health care provider. Document Released: 11/21/2008 Document Revised: 08/15/2015 Document Reviewed: 03/21/2015 Elsevier Interactive Patient Education  2017 [employer]vier Inc.  

## 2016-07-09 ENCOUNTER — Telehealth: Payer: Self-pay | Admitting: *Deleted

## 2016-07-09 NOTE — Telephone Encounter (Signed)
Oncology Nurse Navigator Documentation  Received call from [facility] Barr. He indicated he has decided to discontinue chemo treatments beginning with infusion scheduled for Monday 6/6.  He cited favorable results of 5/11 PET, nausea and fatigue r/t current regime.  I acknowledged his intent, explained I would not cancel appt until he has further conversation with Benjamin Barr.  He voiced agreement. He agreed to arrive for 8:15 lab prior to 9:15 appt with Benjamin Barr.  I spoke with RN [name], Infusion, made her aware of potential cancellation.  [name], RN, BSN, [place] [facility] Nurse [place] [place] [phone]

## 2016-07-09 NOTE — Telephone Encounter (Signed)
I recommend he keeps his appt to see me next week to discuss I will cancel his labs and flush appt

## 2016-07-12 ENCOUNTER — Ambulatory Visit (HOSPITAL_BASED_OUTPATIENT_CLINIC_OR_DEPARTMENT_OTHER): Payer: Medicare Other | Admitting: Hematology and Oncology

## 2016-07-12 ENCOUNTER — Other Ambulatory Visit: Payer: Self-pay

## 2016-07-12 ENCOUNTER — Telehealth: Payer: Self-pay | Admitting: Hematology and Oncology

## 2016-07-12 ENCOUNTER — Ambulatory Visit: Payer: Medicare Other

## 2016-07-12 VITALS — BP 113/59 | HR 96 | Temp 98.8°F | Resp 18 | Ht 68.0 in | Wt 181.3 lb

## 2016-07-12 DIAGNOSIS — Z931 Gastrostomy status: Secondary | ICD-10-CM

## 2016-07-12 DIAGNOSIS — Z794 Long term (current) use of insulin: Secondary | ICD-10-CM

## 2016-07-12 DIAGNOSIS — C787 Secondary malignant neoplasm of liver and intrahepatic bile duct: Secondary | ICD-10-CM | POA: Diagnosis not present

## 2016-07-12 DIAGNOSIS — E1140 Type 2 diabetes mellitus with diabetic neuropathy, unspecified: Secondary | ICD-10-CM | POA: Diagnosis not present

## 2016-07-12 DIAGNOSIS — C029 Malignant neoplasm of tongue, unspecified: Secondary | ICD-10-CM

## 2016-07-12 DIAGNOSIS — C01 Malignant neoplasm of base of tongue: Secondary | ICD-10-CM

## 2016-07-12 NOTE — Telephone Encounter (Signed)
Scheduled appts per 6/4 los - Gave patient AVS and calender.

## 2016-07-14 ENCOUNTER — Encounter: Payer: Self-pay | Admitting: Hematology and Oncology

## 2016-07-14 NOTE — Assessment & Plan Note (Signed)
The patient desired to has his feeding tube removed. He denies significant dysphagia. The reason he is still dependent on feeding tube is due to altered taste sensation and appetite. I recommend he transition intake by mouth and he will call us once his feeding tube is ready to be removed

## 2016-07-14 NOTE — Assessment & Plan Note (Signed)
He has wide fluctuation of blood sugar due to inconsistent oral intake. I would defer to his primary care doctor for further management

## 2016-07-14 NOTE — Assessment & Plan Note (Signed)
He has experienced significant side effects, with associated mucositis, poor appetite and fatigue with chemotherapy. He does not want to pursue further chemotherapy and take a chemo holiday I did warn him about potential risk of relapse without further chemotherapy. He understood the risk. I will get his port flushed and maintained every 6-8 weeks with plan to repeat imaging study again in August to assess disease status.  He agree with the plan of care

## 2016-07-14 NOTE — Progress Notes (Signed)
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 Biopsy recommended. No definite pharyngeal mass lesion identified by CT.       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 No other primary or metastatic cancer. 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         11/13/2015 Procedure    Placement of 20 French pull-through percutaneous gastrostomy tube.      03/11/2016 PET scan    Resolution of hypermetabolic right cervical lymphadenopathy since prior study . No other hypermetabolic lymphadenopathy identified. New hypermetabolic lesion and caudate lobe of liver, highly suspicious for liver metastasis. Recommend liver protocol abdomen MRI without and with contrast for further evaluation. New patchy airspace disease in  both lung apices with FDG uptake. 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 Recommend continued attention on follow-up imaging.       03/19/2016 Imaging    MRI showed 3.4 cm heterogeneously enhancing mass in caudate lobe of liver, consistent liver metastasis. No other sites of metastatic disease identified within the liver or abdomen      03/26/2016 Procedure    Ultrasound and fluoroscopically guided right internal jugular single lumen power port catheter insertion. Tip in the SVC/RA junction. 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 Metabolic response to therapy of caudate lobe liver metastasis. Low-level activity in this area is similar to the surrounding liver.  2. Hypermetabolism in the right inguinal crease without concurrent adenopathy. Especially given subtle surrounding edema, favor cellulitis. Consider physical exam correlation. 3. Lack of visualization of previously described 7 mm left lower lobe pulmonary nodule. 4. Diffuse marrow hypermetabolism, favoring stimulation by chemotherapy.       Cancer of base of tongue ([place])  INTERVAL HISTORY: Please see below for problem oriented charting. He returns with his wife for further follow-up. He has expressed desire to stop chemotherapy and to take treatment holiday With last cycle of treatment, he developed significant mucositis, fatigue, altered taste sensation and nausea. He has not lost any weight. Majority of his symptoms of mucositis and nausea had resolved  REVIEW OF  SYSTEMS:   Constitutional: Denies fevers, chills or abnormal weight loss Eyes: Denies blurriness of vision Respiratory: Denies cough, dyspnea or wheezes Cardiovascular: Denies palpitation, chest discomfort or lower extremity swelling Skin: Denies abnormal skin rashes Lymphatics: Denies new lymphadenopathy or easy bruising Neurological:Denies numbness, tingling or new weaknesses Behavioral/Psych: Mood is stable, no new changes  All other systems were reviewed with the patient and are negative.  I have reviewed the past medical history, past surgical history, social history and family history with the patient and they are unchanged from previous note.  ALLERGIES:  is allergic to cetuximab.  MEDICATIONS:  Current Outpatient Prescriptions  Medication Sig Dispense Refill  . aspirin EC 81 MG tablet Take 81 mg by mouth at bedtime.     . clopidogrel (PLAVIX) 75 MG tablet TAKE 1 TABLET EVERY DAY 90 tablet 1  . fluticasone (FLONASE) 50 MCG/ACT nasal spray Place 2 sprays into both nostrils at bedtime as needed for rhinitis.     [name] insulin glargine (LANTUS) 100 UNIT/ML injection Inject 100 Units into the skin at bedtime.     [name] ketoconazole (NIZORAL) 2 % shampoo Apply 1 application topically every 3 (three) days.    [name] lidocaine-prilocaine (EMLA) cream Apply 1 application topically once as needed (prior to accessing port).    . mirtazapine (REMERON) 15 MG tablet Take 1 tablet (15 mg total) by mouth at bedtime. 30 tablet 11  . morphine (MSIR) 15 MG tablet Take 1 tablet (15 mg total) by mouth every 4 (four) hours as needed for severe pain. 60 tablet 0  . Nutritional Supplements (FEEDING SUPPLEMENT, OSMOLITE 1.5 CAL,) LIQD Give 1 1/2 cans osmolite 1.5 via PEG QID with 60 cc free water before and after feedings.  In addition, flush tube with 240 cc free water TID between meals. 6 Bottle 0  . silver sulfADIAZINE (SILVADENE) 1 % cream Apply 1 application topically at bedtime. Pt applies to sacral ulceration  area.    . simvastatin (ZOCOR) 20 MG tablet Take 1 tablet (20 mg total) by mouth at bedtime. 90 tablet 3  . sodium fluoride (FLUORISHIELD) 1.1 % GEL dental gel Instill one drop of gel per tooth space of fluoride tray. Place over teeth for 5 minutes. Remove. Spit out excess. Repeat nightly. 120 mL 11  . fentaNYL (DURAGESIC - DOSED MCG/HR) 25 MCG/HR patch Place 1 patch (25 mcg total) onto the skin every 3 (three) days. (Patient not taking: Reported on 05/31/2016) 5 patch 0  . oxyCODONE-acetaminophen (PERCOCET/ROXICET) 5-325 MG tablet Take 1 tablet by mouth every 4 (four) hours as needed for severe pain. (Patient not taking: Reported on 05/31/2016) 18 tablet 0   No current facility-administered medications for this visit.     PHYSICAL EXAMINATION: ECOG PERFORMANCE STATUS: 1 - Symptomatic but completely ambulatory  Vitals:   07/12/16 0839  BP: (!) 113/59  Pulse: 96  Resp: 18  Temp: 98.8 F (37.1 C)   Filed Weights   07/12/16 0839  Weight: 181 lb 4.8 oz (82.2 kg)    GENERAL:alert, no distress and comfortable SKIN: skin color, texture, turgor are normal, no rashes or significant lesions EYES:  normal, Conjunctiva are pink and non-injected, sclera clear Musculoskeletal:no cyanosis of digits and no clubbing  NEURO: alert & oriented x 3 with fluent speech, no focal motor/sensory deficits  LABORATORY DATA:  I have reviewed the data as listed    Component Value Date/Time   NA 140 06/21/2016 0753   K 3.9 06/21/2016 0753   CL 99 (L) 04/12/2016 1211   CO2 27 06/21/2016 0753   GLUCOSE 274 (H) 06/21/2016 0753   BUN 15.5 06/21/2016 0753   CREATININE 0.9 06/21/2016 0753   CALCIUM 9.6 06/21/2016 0753   PROT 6.7 06/21/2016 0753   ALBUMIN 3.6 06/21/2016 0753   AST 21 06/21/2016 0753   ALT 21 06/21/2016 0753   ALKPHOS 121 06/21/2016 0753   BILITOT 0.48 06/21/2016 0753   GFRNONAA >60 04/05/2016 2014   GFRAA >60 04/05/2016 2014    No results found for: SPEP, UPEP  Lab Results  Component  Value Date   WBC 10.0 06/21/2016   NEUTROABS 9.0 (H) 06/21/2016   HGB 10.8 (L) 06/21/2016   HCT 31.6 (L) 06/21/2016   MCV 92.4 06/21/2016   PLT 149 06/21/2016      Chemistry      Component Value Date/Time   NA 140 06/21/2016 0753   K 3.9 06/21/2016 0753   CL 99 (L) 04/12/2016 1211   CO2 27 06/21/2016 0753   BUN 15.5 06/21/2016 0753   CREATININE 0.9 06/21/2016 0753   GLU 78 11/13/2015 1134      Component Value Date/Time   CALCIUM 9.6 06/21/2016 0753   ALKPHOS 121 06/21/2016 0753   AST 21 06/21/2016 0753   ALT 21 06/21/2016 0753   BILITOT 0.48 06/21/2016 0753       RADIOGRAPHIC STUDIES: I have personally reviewed the radiological images as listed and agreed with the findings in the report. Nm Pet Image Restag (ps) Skull Base To Thigh  Result Date: 06/18/2016 CLINICAL DATA:  Subsequent treatment strategy for restaging of metastatic tongue cancer. Liver metastasis. EXAM: NUCLEAR MEDICINE PET SKULL BASE TO THIGH TECHNIQUE: 8.7 MCi F-18 FDG was injected intravenously. Full-ring PET imaging was performed from the skull base to thigh after the radiotracer. CT data was obtained and used for attenuation correction and anatomic localization. FASTING BLOOD GLUCOSE:  Value: 72 mg/dl COMPARISON:  PET of 03/11/2016. Chest CT of 04/12/2016. Abdominal MRI of 03/17/2016. FINDINGS: NECK No cervical nodal or mucosal hypermetabolism identified. No cervical adenopathy. Dense bilateral carotid atherosclerosis. CHEST No thoracic nodal or pulmonary parenchymal hypermetabolism identified. Right-sided Port-A-Cath which terminates at the high right atrium. Mild cardiomegaly with multivessel coronary artery atherosclerosis. The left lower lobe 7 mm nodule described on the prior diagnostic CT is not readily apparent. Resolved right-sided pleural effusion. ABDOMEN/PELVIS Caudate lobe lesion measures on the order of 2.9 x 2.1 cm and is not significantly hypermetabolic to the remainder of the liver (i.e. a S.U.V.  max of 3.9). This is relatively similar in size and significantly hypermetabolic on the prior. No new hypermetabolic liver lesions. Hypermetabolism which is right likely reactive corresponding to gastrostomy tube. A focus of hypermetabolism is identified about the right inguinal crease. This measures a S.U.V. max of 7.6, including on approximately image 202/series 4. No node in this area. Suspect mild subcutaneous edema immediately adjacent to this crease, new. Normal adrenal glands. Gallstones. Right renal cyst. Nonobstructive 3.2 cm lipoma in the ascending colon. Abdominal aortic atherosclerosis. Moderate prostatomegaly. SKELETON There is relatively diffuse marrow hypermetabolism. No focal abnormality identified. Right iliac sclerotic lesion is likely a  bone island. Remote right rib trauma. IMPRESSION: 1. Metabolic response to therapy of caudate lobe liver metastasis. Low-level activity in this area is similar to the surrounding liver. 2. Hypermetabolism in the right inguinal crease without concurrent adenopathy. Especially given subtle surrounding edema, favor cellulitis. Consider physical exam correlation. 3. Lack of visualization of previously described 7 mm left lower lobe pulmonary nodule. 4. Diffuse marrow hypermetabolism, favoring stimulation by chemotherapy. Electronically Signed   By: Abigail Miyamoto M.D.   On: 06/18/2016 13:41    ASSESSMENT & PLAN:  Tongue cancer ([place]) He has experienced significant side effects, with associated mucositis, poor appetite and fatigue with chemotherapy. He does not want to pursue further chemotherapy and take a chemo holiday I did warn him about potential risk of relapse without further chemotherapy. He understood the risk. I will get his port flushed and maintained every 6-8 weeks with plan to repeat imaging study again in August to assess disease status.  He agree with the plan of care  Diabetes mellitus with diabetic neuropathy, with long-term current use of  insulin ([place]) He has wide fluctuation of blood sugar due to inconsistent oral intake. I would defer to his primary care doctor for further management  S/P gastrostomy [facility]) The patient desired to has his feeding tube removed. He denies significant dysphagia. The reason he is still dependent on feeding tube is due to altered taste sensation and appetite. I recommend he transition intake by mouth and he will call us once his feeding tube is ready to be removed   Orders Placed This Encounter  Procedures  . NM PET Image Restag (PS) Skull Base To Thigh    Standing Status:   Future    Standing Expiration Date:   09/11/2017    Order Specific Question:   Reason for Exam (SYMPTOM  OR DIAGNOSIS REQUIRED)    Answer:   staging tongue ca, assess response to Rx    Order Specific Question:   Preferred imaging location?    Answer:   [facility]   All questions were answered. The patient knows to call the clinic with any problems, questions or concerns. No barriers to learning was detected. I spent 15 minutes counseling the patient face to face. The total time spent in the appointment was 20 minutes and more than 50% was on counseling and review of test results     Heath Lark, MD 07/14/2016 6:56 AM

## 2016-07-16 ENCOUNTER — Ambulatory Visit: Payer: Self-pay

## 2016-07-17 ENCOUNTER — Ambulatory Visit: Payer: Self-pay

## 2016-08-02 ENCOUNTER — Ambulatory Visit: Payer: Self-pay | Admitting: Hematology and Oncology

## 2016-08-02 ENCOUNTER — Other Ambulatory Visit: Payer: Self-pay

## 2016-08-02 ENCOUNTER — Ambulatory Visit: Payer: Self-pay

## 2016-08-02 ENCOUNTER — Encounter: Payer: Self-pay | Admitting: Nutrition

## 2016-08-06 ENCOUNTER — Ambulatory Visit: Payer: Self-pay

## 2016-08-06 ENCOUNTER — Ambulatory Visit (HOSPITAL_BASED_OUTPATIENT_CLINIC_OR_DEPARTMENT_OTHER): Payer: Medicare Other

## 2016-08-06 DIAGNOSIS — C01 Malignant neoplasm of base of tongue: Secondary | ICD-10-CM | POA: Diagnosis not present

## 2016-08-06 DIAGNOSIS — R110 Nausea: Secondary | ICD-10-CM

## 2016-08-06 DIAGNOSIS — Z452 Encounter for adjustment and management of vascular access device: Secondary | ICD-10-CM | POA: Diagnosis present

## 2016-08-06 MED ORDER — SODIUM CHLORIDE 0.9% FLUSH
10.0000 mL | Freq: Once | INTRAVENOUS | Status: AC
  Administered 2016-08-06: 10 mL
  Filled 2016-08-06: qty 10

## 2016-08-06 MED ORDER — HEPARIN SOD (PORK) LOCK FLUSH 100 UNIT/ML IV SOLN
500.0000 [IU] | Freq: Once | INTRAVENOUS | Status: AC
  Administered 2016-08-06: 500 [IU]
  Filled 2016-08-06: qty 5

## 2016-08-07 ENCOUNTER — Ambulatory Visit: Payer: Self-pay

## 2016-09-20 ENCOUNTER — Ambulatory Visit (HOSPITAL_COMMUNITY)
Admission: RE | Admit: 2016-09-20 | Discharge: 2016-09-20 | Disposition: A | Discharge status: Home / Self Care | Payer: Medicare Other | Source: Ambulatory Visit | Attending: Hematology and Oncology | Admitting: Hematology and Oncology

## 2016-09-20 ENCOUNTER — Ambulatory Visit (HOSPITAL_BASED_OUTPATIENT_CLINIC_OR_DEPARTMENT_OTHER): Payer: Medicare Other

## 2016-09-20 ENCOUNTER — Other Ambulatory Visit (HOSPITAL_BASED_OUTPATIENT_CLINIC_OR_DEPARTMENT_OTHER): Payer: Medicare Other

## 2016-09-20 DIAGNOSIS — S2243XD Multiple fractures of ribs, bilateral, subsequent encounter for fracture with routine healing: Secondary | ICD-10-CM | POA: Diagnosis not present

## 2016-09-20 DIAGNOSIS — I6529 Occlusion and stenosis of unspecified carotid artery: Secondary | ICD-10-CM | POA: Insufficient documentation

## 2016-09-20 DIAGNOSIS — C01 Malignant neoplasm of base of tongue: Secondary | ICD-10-CM | POA: Insufficient documentation

## 2016-09-20 DIAGNOSIS — I2510 Atherosclerotic heart disease of native coronary artery without angina pectoris: Secondary | ICD-10-CM | POA: Insufficient documentation

## 2016-09-20 DIAGNOSIS — N281 Cyst of kidney, acquired: Secondary | ICD-10-CM | POA: Diagnosis not present

## 2016-09-20 DIAGNOSIS — K769 Liver disease, unspecified: Secondary | ICD-10-CM | POA: Diagnosis not present

## 2016-09-20 DIAGNOSIS — R110 Nausea: Secondary | ICD-10-CM

## 2016-09-20 DIAGNOSIS — C770 Secondary and unspecified malignant neoplasm of lymph nodes of head, face and neck: Secondary | ICD-10-CM

## 2016-09-20 DIAGNOSIS — R937 Abnormal findings on diagnostic imaging of other parts of musculoskeletal system: Secondary | ICD-10-CM | POA: Insufficient documentation

## 2016-09-20 DIAGNOSIS — I700 Atherosclerosis of aorta: Secondary | ICD-10-CM | POA: Insufficient documentation

## 2016-09-20 DIAGNOSIS — X58XXXD Exposure to other specified factors, subsequent encounter: Secondary | ICD-10-CM | POA: Insufficient documentation

## 2016-09-20 DIAGNOSIS — C029 Malignant neoplasm of tongue, unspecified: Secondary | ICD-10-CM

## 2016-09-20 DIAGNOSIS — C787 Secondary malignant neoplasm of liver and intrahepatic bile duct: Secondary | ICD-10-CM

## 2016-09-20 LAB — COMPREHENSIVE METABOLIC PANEL
ALBUMIN: 3.7 g/dL (ref 3.5–5.0)
ALT: 18 U/L (ref 0–55)
AST: 20 U/L (ref 5–34)
Alkaline Phosphatase: 102 U/L (ref 40–150)
Anion Gap: 10 mEq/L (ref 3–11)
BUN: 16 mg/dL (ref 7.0–26.0)
CALCIUM: 10.3 mg/dL (ref 8.4–10.4)
CO2: 29 mEq/L (ref 22–29)
CREATININE: 0.8 mg/dL (ref 0.7–1.3)
Chloride: 104 mEq/L (ref 98–109)
EGFR: 87 mL/min/{1.73_m2} — AB (ref >90)
Glucose: 59 mg/dl — ABNORMAL LOW (ref 70–140)
Potassium: 3.7 mEq/L (ref 3.5–5.1)
Sodium: 142 mEq/L (ref 136–145)
TOTAL PROTEIN: 7.1 g/dL (ref 6.4–8.3)
Total Bilirubin: 0.51 mg/dL (ref 0.20–1.20)

## 2016-09-20 LAB — CBC WITH DIFFERENTIAL/PLATELET
BASO%: 0.2 % (ref 0.0–2.0)
Basophils Absolute: 0 10*3/uL (ref 0.0–0.1)
EOS ABS: 0.1 10*3/uL (ref 0.0–0.5)
EOS%: 1.2 % (ref 0.0–7.0)
HCT: 45.9 % (ref 38.4–49.9)
HEMOGLOBIN: 15.4 g/dL (ref 13.0–17.1)
LYMPH#: 0.6 10*3/uL — AB (ref 0.9–3.3)
LYMPH%: 9.5 % — ABNORMAL LOW (ref 14.0–49.0)
MCH: 30.5 pg (ref 27.2–33.4)
MCHC: 33.6 g/dL (ref 32.0–36.0)
MCV: 90.9 fL (ref 79.3–98.0)
MONO#: 0.5 10*3/uL (ref 0.1–0.9)
MONO%: 8.7 % (ref 0.0–14.0)
NEUT%: 80.4 % — ABNORMAL HIGH (ref 39.0–75.0)
NEUTROS ABS: 4.7 10*3/uL (ref 1.5–6.5)
Platelets: 146 10*3/uL (ref 140–400)
RBC: 5.05 10*6/uL (ref 4.20–5.82)
RDW: 12.9 % (ref 11.0–14.6)
WBC: 5.8 10*3/uL (ref 4.0–10.3)

## 2016-09-20 LAB — GLUCOSE, CAPILLARY: GLUCOSE-CAPILLARY: 60 mg/dL — AB (ref 65–99)

## 2016-09-20 MED ORDER — FLUDEOXYGLUCOSE F - 18 (FDG) INJECTION
9.0100 | Freq: Once | INTRAVENOUS | Status: AC | PRN
  Administered 2016-09-20: 9.01 via INTRAVENOUS

## 2016-09-20 MED ORDER — HEPARIN SOD (PORK) LOCK FLUSH 100 UNIT/ML IV SOLN
500.0000 [IU] | Freq: Once | INTRAVENOUS | Status: AC
Start: 2016-09-20 — End: 2016-09-20
  Administered 2016-09-20: 500 [IU]
  Filled 2016-09-20: qty 5

## 2016-09-20 MED ORDER — SODIUM CHLORIDE 0.9% FLUSH
10.0000 mL | Freq: Once | INTRAVENOUS | Status: AC
  Administered 2016-09-20: 10 mL
  Filled 2016-09-20: qty 10

## 2016-09-21 ENCOUNTER — Telehealth: Payer: Self-pay | Admitting: Hematology and Oncology

## 2016-09-21 ENCOUNTER — Ambulatory Visit (HOSPITAL_BASED_OUTPATIENT_CLINIC_OR_DEPARTMENT_OTHER): Payer: Medicare Other | Admitting: Hematology and Oncology

## 2016-09-21 DIAGNOSIS — B370 Candidal stomatitis: Secondary | ICD-10-CM

## 2016-09-21 DIAGNOSIS — C787 Secondary malignant neoplasm of liver and intrahepatic bile duct: Secondary | ICD-10-CM

## 2016-09-21 DIAGNOSIS — C029 Malignant neoplasm of tongue, unspecified: Secondary | ICD-10-CM

## 2016-09-21 DIAGNOSIS — I2510 Atherosclerotic heart disease of native coronary artery without angina pectoris: Secondary | ICD-10-CM

## 2016-09-21 DIAGNOSIS — I10 Essential (primary) hypertension: Secondary | ICD-10-CM

## 2016-09-21 DIAGNOSIS — Z95828 Presence of other vascular implants and grafts: Secondary | ICD-10-CM

## 2016-09-21 DIAGNOSIS — Z9861 Coronary angioplasty status: Secondary | ICD-10-CM | POA: Diagnosis not present

## 2016-09-21 DIAGNOSIS — R1310 Dysphagia, unspecified: Secondary | ICD-10-CM | POA: Diagnosis not present

## 2016-09-21 DIAGNOSIS — L929 Granulomatous disorder of the skin and subcutaneous tissue, unspecified: Secondary | ICD-10-CM | POA: Diagnosis not present

## 2016-09-21 DIAGNOSIS — C01 Malignant neoplasm of base of tongue: Secondary | ICD-10-CM

## 2016-09-21 DIAGNOSIS — I890 Lymphedema, not elsewhere classified: Secondary | ICD-10-CM

## 2016-09-21 MED ORDER — FLUCONAZOLE 100 MG PO TABS: 100.0000 mg | ORAL_TABLET | Freq: Every day | ORAL | 0 refills | Status: DC

## 2016-09-21 NOTE — Telephone Encounter (Signed)
Gave patient/wife avs report and appointments for September and November.

## 2016-09-23 ENCOUNTER — Encounter: Payer: Self-pay | Admitting: Hematology and Oncology

## 2016-09-23 DIAGNOSIS — L929 Granulomatous disorder of the skin and subcutaneous tissue, unspecified: Secondary | ICD-10-CM | POA: Insufficient documentation

## 2016-09-23 DIAGNOSIS — Z95828 Presence of other vascular implants and grafts: Secondary | ICD-10-CM | POA: Insufficient documentation

## 2016-09-23 DIAGNOSIS — B370 Candidal stomatitis: Secondary | ICD-10-CM | POA: Insufficient documentation

## 2016-09-23 NOTE — Assessment & Plan Note (Signed)
He has evidence of oral thrush I recommend fluconazole for treatment

## 2016-09-23 NOTE — Assessment & Plan Note (Signed)
he will continue current medical management. I recommend close follow-up with primary care doctor for medication adjustment.  

## 2016-09-23 NOTE — Assessment & Plan Note (Signed)
The patient has stopped a lot of his medications I recommend he resume his heart related medications for secondary prevention.  I also recommend he returns back to see his primary care doctor for further follow-up.

## 2016-09-23 NOTE — Assessment & Plan Note (Signed)
We reviewed the imaging study There is no growth in the liver metastasis and his liver function is preserved Recommend observation only

## 2016-09-23 NOTE — Assessment & Plan Note (Signed)
He complained of discomfort at the port site We discussed the risk and benefit of port removal Ultimately, the patient decided to keep his port for now

## 2016-09-23 NOTE — Assessment & Plan Note (Signed)
He is noted to have granulation tissue at the site of gastrostomy tube The patient is fed up and wants a feeding tube removed However, he is dependent on his feeding tube for over 80% of his caloric intake Again, I recommend he tries to swallow and return to speech and language therapist for treatment of dysphagia.

## 2016-09-23 NOTE — Progress Notes (Signed)
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 Biopsy recommended. No definite pharyngeal mass lesion identified by CT.       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 No other primary or metastatic cancer. 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         11/13/2015 Procedure    Placement of 20 French pull-through percutaneous gastrostomy tube.      03/11/2016 PET scan    Resolution of hypermetabolic right cervical lymphadenopathy since prior study . No other hypermetabolic lymphadenopathy identified. New hypermetabolic lesion and caudate lobe of liver, highly suspicious for liver metastasis. Recommend liver protocol abdomen MRI without and with contrast for further evaluation. New patchy airspace disease in  both lung apices with FDG uptake. 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 Recommend continued attention on follow-up imaging.       03/19/2016 Imaging    MRI showed 3.4 cm heterogeneously enhancing mass in caudate lobe of liver, consistent liver metastasis. No other sites of metastatic disease identified within the liver or abdomen      03/26/2016 Procedure    Ultrasound and fluoroscopically guided right internal jugular single lumen power port catheter insertion. Tip in the SVC/RA junction. 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 Metabolic response to therapy of caudate lobe liver metastasis. Low-level activity in this area is similar to the surrounding liver.  2. Hypermetabolism in the right inguinal crease without concurrent adenopathy. Especially given subtle surrounding edema, favor cellulitis. Consider physical exam correlation. 3. Lack of visualization of previously described 7 mm left lower lobe pulmonary nodule. 4. Diffuse marrow hypermetabolism, favoring stimulation by chemotherapy.      09/20/2016 PET scan  1. Reduced activity in the hypermetabolic caudate lobe lesion of the liver, likely a metastatic lesion. 2. Scattered likely inflammatory activity along the glenohumeral and acromioclavicular joints and in the hips. 3. Healing of bilateral upper rib fractures. 4. Other imaging findings of potential clinical significance: Aortic Atherosclerosis (ICD10-I70.0). Coronary and  carotid atherosclerosis. Photopenic right renal cysts.       Cancer of base of tongue ([place])    INTERVAL HISTORY: Please see below for problem oriented charting. He returns with his wife to review test results. He has self discontinued a lot of medications He has not swallow for a long time because he felt recent dysphagia He has not lost any weight He denies pain He has noted some lymphedema around his neck but has not done any exercises He has not seen a dentist recently He is concerned about granulation tissue around his feeding tube He wants the feeding tube to be removed soon He complained of discomfort in his neck from the port placement area REVIEW OF SYSTEMS:   Constitutional: Denies fevers, chills or abnormal weight loss Eyes: Denies blurriness of vision Ears, nose, mouth, throat, and face: Denies mucositis or sore throat Respiratory: Denies cough, dyspnea or wheezes Cardiovascular: Denies palpitation, chest discomfort or lower extremity swelling Gastrointestinal:  Denies nausea, heartburn or change in bowel habits Skin: Denies abnormal skin rashes Lymphatics: Denies new lymphadenopathy or easy bruising Neurological:Denies numbness, tingling or new weaknesses Behavioral/Psych: Mood is stable, no new changes  All other systems were reviewed with the patient and are negative.  I have reviewed the past medical history, past surgical history, social history and family history with the patient and they are unchanged from previous note.  ALLERGIES:  is allergic to cetuximab.  MEDICATIONS:  Current Outpatient Prescriptions  Medication Sig Dispense Refill  . aspirin EC 81 MG tablet Take 81 mg by mouth at bedtime.     . clopidogrel (PLAVIX) 75 MG tablet TAKE 1 TABLET EVERY DAY 90 tablet 1  . fluconazole (DIFLUCAN) 100 MG tablet Take 1 tablet (100 mg total) by mouth daily. 7 tablet 0  . fluticasone (FLONASE) 50 MCG/ACT nasal spray Place 2 sprays into both nostrils at bedtime  as needed for rhinitis.     [name] insulin glargine (LANTUS) 100 UNIT/ML injection Inject 100 Units into the skin at bedtime.     [name] ketoconazole (NIZORAL) 2 % shampoo Apply 1 application topically every 3 (three) days.    [name] lidocaine-prilocaine (EMLA) cream Apply 1 application topically once as needed (prior to accessing port).    . mirtazapine (REMERON) 15 MG tablet Take 1 tablet (15 mg total) by mouth at bedtime. 30 tablet 11  . Nutritional Supplements (FEEDING SUPPLEMENT, OSMOLITE 1.5 CAL,) LIQD Give 1 1/2 cans osmolite 1.5 via PEG QID with 60 cc free water before and after feedings.  In addition, flush tube with 240 cc free water TID between meals. 6 Bottle 0  . sodium fluoride (FLUORISHIELD) 1.1 % GEL dental gel Instill one drop of gel per tooth space of fluoride tray. Place over teeth for 5 minutes. Remove. Spit out excess. Repeat nightly. 120 mL 11   No current facility-administered medications for this visit.     PHYSICAL EXAMINATION: ECOG PERFORMANCE STATUS: 1 - Symptomatic but completely ambulatory  Vitals:   09/21/16 1323  BP: (!) 156/55  Pulse: 62  Resp: 18  Temp: 98.7 F (37.1 C)  SpO2: 98%   Filed Weights   09/21/16 1323  Weight: 185 lb 6.4 oz (  84.1 kg)    GENERAL:alert, no distress and comfortable SKIN: skin color, texture, turgor are normal, no rashes or significant lesions EYES: normal, Conjunctiva are pink and non-injected, sclera clear OROPHARYNX: Noted poor dentition.  Noted mild oral thrush. NECK: supple, thyroid normal size, non-tender, without nodularity LYMPH:  no palpable lymphadenopathy in the cervical, axillary or inguinal LUNGS: clear to auscultation and percussion with normal breathing effort HEART: regular rate & rhythm and no murmurs and no lower extremity edema ABDOMEN:abdomen soft, non-tender and normal bowel sounds.  There is healthy granulation tissue around the feeding tube site. Musculoskeletal:no cyanosis of digits and no clubbing.  Port site  looks okay NEURO: alert & oriented x 3 with fluent speech, no focal motor/sensory deficits  LABORATORY DATA:  I have reviewed the data as listed    Component Value Date/Time   NA 142 09/20/2016 0906   K 3.7 09/20/2016 0906   CL 99 (L) 04/12/2016 1211   CO2 29 09/20/2016 0906   GLUCOSE 59 (L) 09/20/2016 0906   BUN 16.0 09/20/2016 0906   CREATININE 0.8 09/20/2016 0906   CALCIUM 10.3 09/20/2016 0906   PROT 7.1 09/20/2016 0906   ALBUMIN 3.7 09/20/2016 0906   AST 20 09/20/2016 0906   ALT 18 09/20/2016 0906   ALKPHOS 102 09/20/2016 0906   BILITOT 0.51 09/20/2016 0906   GFRNONAA >60 04/05/2016 2014   GFRAA >60 04/05/2016 2014    No results found for: SPEP, UPEP  Lab Results  Component Value Date   WBC 5.8 09/20/2016   NEUTROABS 4.7 09/20/2016   HGB 15.4 09/20/2016   HCT 45.9 09/20/2016   MCV 90.9 09/20/2016   PLT 146 09/20/2016      Chemistry      Component Value Date/Time   NA 142 09/20/2016 0906   K 3.7 09/20/2016 0906   CL 99 (L) 04/12/2016 1211   CO2 29 09/20/2016 0906   BUN 16.0 09/20/2016 0906   CREATININE 0.8 09/20/2016 0906   GLU 78 11/13/2015 1134      Component Value Date/Time   CALCIUM 10.3 09/20/2016 0906   ALKPHOS 102 09/20/2016 0906   AST 20 09/20/2016 0906   ALT 18 09/20/2016 0906   BILITOT 0.51 09/20/2016 0906       RADIOGRAPHIC STUDIES: I have personally reviewed the radiological images as listed and agreed with the findings in the report. Nm Pet Image Restag (ps) Skull Base To Thigh  Result Date: 09/20/2016 CLINICAL DATA:  Subsequent treatment strategy for cancer of the base of the tongue. EXAM: NUCLEAR MEDICINE PET SKULL BASE TO THIGH TECHNIQUE: 9.0 mCi F-18 FDG was injected intravenously. Full-ring PET imaging was performed from the skull base to thigh after the radiotracer. CT data was obtained and used for attenuation correction and anatomic localization. FASTING BLOOD GLUCOSE:  Value: 60 mg/dl COMPARISON:  Multiple exams, including  06/18/2016 FINDINGS: NECK No hypermetabolic lymph nodes in the neck. Atherosclerotic calcification of the carotid arteries. CHEST No hypermetabolic mediastinal or hilar nodes. No suspicious pulmonary nodules on the CT data. Right Port-A-Cath tip: Right atrium. Coronary, aortic arch, and branch vessel atherosclerotic vascular disease. Stable scarring in the posterior basal segment left lower lobe. ABDOMEN/PELVIS Hypermetabolic lesion in the caudate lobe of liver is mildly hypodense and difficult to measure but approximately 3.0 by 2.1 cm, essentially stable compared prior exam. Maximum SUV 7.3, formerly 13.4. Percutaneous gastrostomy tube noted.  Photopenic right renal cysts. Aortoiliac atherosclerotic vascular disease. Postoperative findings in the junction of the descending and sigmoid colon.  SKELETON Scattered likely inflammatory activity along the shoulders (right greater than left) and hips. No findings characteristic for osseous metastatic disease. Deformities from bilateral upper rib fractures, without current hypermetabolic activity. No specific indicators of osseous metastatic disease. IMPRESSION: 1. Reduced activity in the hypermetabolic caudate lobe lesion of the liver, likely a metastatic lesion. 2. Scattered likely inflammatory activity along the glenohumeral and acromioclavicular joints and in the hips. 3. Healing of bilateral upper rib fractures. 4. Other imaging findings of potential clinical significance: Aortic Atherosclerosis (ICD10-I70.0). Coronary and carotid atherosclerosis. Photopenic right renal cysts. Electronically Signed   By: Van Clines M.D.   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[facility]) We reviewed the imaging study There is no growth in the liver metastasis and his liver function is preserved Recommend observation only  Dysphagia The patient is noncompliant He has persistent dysphagia and has not done any swallowing exercises directed He has declined to see speech and language therapist and yet with dysphagia, he refused to eat He wants his feeding tube out I spent a lot of time educating the patient importance of swallowing exercise I offered to refer him back to speech and language therapist but he declined  CAD S/P percutaneous coronary angioplasty The patient has stopped a lot of his medications I recommend he resume his heart related medications for secondary prevention.  I also recommend he returns back to see his primary care doctor for further follow-up.  Oral thrush He has evidence of oral thrush I recommend fluconazole for treatment  Granulation tissue of site of gastrostomy He is noted to have granulation tissue at the site of gastrostomy tube The patient is fed up and wants a feeding tube removed However, he is dependent on his feeding tube for over 80% of his caloric intake Again, I recommend he tries to swallow and return to speech and language therapist for treatment of dysphagia.  Port catheter in place He complained of discomfort at the port site We discussed the risk and benefit of port removal Ultimately, the patient decided to keep his port for now  Essential hypertension he will continue current medical management. I recommend close follow-up with primary care doctor for medication adjustment.   Acquired lymphedema He has acquired lymphedema at the base of his neck He is not doing exercise as directed by physical therapist I recommend return appointment to see them but the patient  declined.   Orders Placed This Encounter  Procedures  . NM PET Image Restag (PS) Skull Base To Thigh    Standing Status:   Future    Standing Expiration Date:   09/23/2017    Order Specific Question:   If indicated for the ordered procedure, I authorize the administration of a radiopharmaceutical per Radiology protocol    Answer:   Yes    Order Specific Question:   Preferred imaging location?    Answer:   [facility]    Order Specific Question:   Radiology Contrast Protocol - do NOT remove file path  Answer:   [email or web address]   All questions were answered. The patient knows to call the clinic with any problems, questions or concerns. No barriers to learning was detected. I spent 25 minutes counseling the patient face to face. The total time spent in the appointment was 40 minutes and more than 50% was on counseling and review of test results     Heath Lark, MD 09/23/2016 9:02 AM

## 2016-09-23 NOTE — Assessment & Plan Note (Signed)
The patient is noncompliant He has persistent dysphagia and has not done any swallowing exercises directed He has declined to see speech and language therapist and yet with dysphagia, he refused to eat He wants his feeding tube out I spent a lot of time educating the patient importance of swallowing exercise I offered to refer him back to speech and language therapist but he declined

## 2016-09-23 NOTE — Assessment & Plan Note (Signed)
He has acquired lymphedema at the base of his neck He is not doing exercise as directed by physical therapist I recommend return appointment to see them but the patient declined.

## 2016-09-23 NOTE — Assessment & Plan Note (Signed)
PET CT scan show persistent disease but no growth in the liver metastasis He is not symptomatic We discussed the risk and benefit of treatment and he elected for observation only at this point which I think is reasonable, given poor tolerance to treatment I plan to see him back again in 3 months with repeat history, blood work and imaging study I will get his port flushed in the meantime

## 2016-09-27 ENCOUNTER — Telehealth: Payer: Self-pay

## 2016-09-27 NOTE — Telephone Encounter (Signed)
Called and spoke with patient wife concerning time change on 11/6. Per [facility] schedule change

## 2016-09-29 ENCOUNTER — Other Ambulatory Visit: Payer: Self-pay | Admitting: Hematology and Oncology

## 2016-09-30 ENCOUNTER — Telehealth: Payer: Self-pay | Admitting: *Deleted

## 2016-09-30 MED ORDER — FLUCONAZOLE 100 MG PO TABS
100.0000 mg | ORAL_TABLET | Freq: Every day | ORAL | 0 refills | Status: DC
Start: 2016-09-30 — End: 2016-11-04

## 2016-09-30 NOTE — Telephone Encounter (Signed)
Pt states he still has the "white thick stuff on the inside of my mouth". Took fluconazole for 7 days. Wants to know if he can get a refill. Cannot do the mouthwash due to gag reflex.

## 2016-09-30 NOTE — Telephone Encounter (Signed)
The thick stuff is from chronic dry mouth OK to refill only 1 more time if persistent he needs to be referred to ID. I am not convinced he has recurrent yeast infection

## 2016-09-30 NOTE — Telephone Encounter (Signed)
Notified of message below

## 2016-10-19 IMAGING — NM NM MISC PROCEDURE
6 series · 36 of 36 positions shown · non-contrast
Comparison: none

[Series 1: wbr rest · 6.40mm/px · 6 of 64 frames shown]
[frame 6/64]
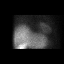
[frame 16/64]
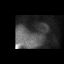
[frame 27/64]
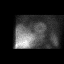
[frame 38/64]
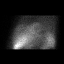
[frame 48/64]
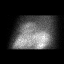
[frame 59/64]
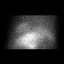

[Series 1: wbr_r-proj_st wbr rest · 6.40mm/px · 6 of 64 frames shown]
[frame 6/64]
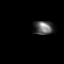
[frame 16/64]
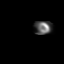
[frame 27/64]
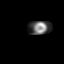
[frame 38/64]
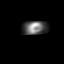
[frame 48/64]
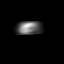
[frame 59/64]
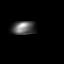

[Series 2: wbr_s-proj_st wbr stress-gsp · 6.40mm/px · 6 of 512 frames shown]
[frame 43/512]
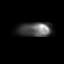
[frame 128/512]
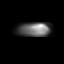
[frame 214/512]
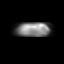
[frame 299/512]
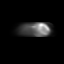
[frame 384/512]
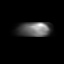
[frame 470/512]
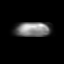

[Series 2: wbr stress-gsp · 6.40mm/px · 6 of 496 frames shown]
[frame 42/496  full-range]
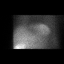
[frame 124/496  full-range]
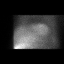
[frame 207/496  full-range]
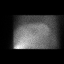
[frame 290/496  full-range]
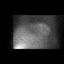
[frame 372/496  full-range]
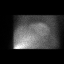
[frame 455/496  full-range]
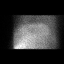

[Series 3: wbr stress-sum-em · 6.40mm/px · 6 of 64 frames shown]
[frame 6/64]
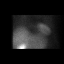
[frame 16/64]
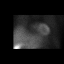
[frame 27/64]
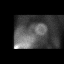
[frame 38/64]
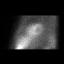
[frame 48/64]
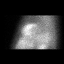
[frame 59/64]
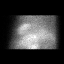

[Series 3: wbr_s-proj_st wbr stress-sum-em · 6.40mm/px · 6 of 64 frames shown]
[frame 6/64]
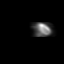
[frame 16/64]
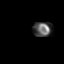
[frame 27/64]
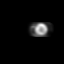
[frame 38/64]
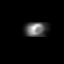
[frame 48/64]
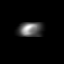
[frame 59/64]
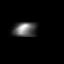

[36 of 36 positions shown; findings below may reference images not displayed]

Canned report from images found in remote index.

Refer to host system for actual result text.

## 2016-10-22 ENCOUNTER — Encounter: Payer: Self-pay | Admitting: Cardiology

## 2016-10-25 ENCOUNTER — Other Ambulatory Visit: Payer: Self-pay | Admitting: Cardiology

## 2016-10-29 NOTE — Progress Notes (Signed)
Cardiology Office Note   Date:  11/04/2016   ID:  Benjamin, Barr [birth year], MRN [id number]  PCP:  Aura Dials, MD  Cardiologist:  Dr. [name]     Chief Complaint  Patient presents with  . Follow-up  . Coronary Artery Disease      History of Present Illness: Benjamin Barr is a 71 y.o. male who presents for follow up CAD.  He has w/ PMH of CAD w/ Taxus DES to LCx & RCA in 2008, rheumatoid arthritis (& new Dx of PMR), type II DM, prostate CA.He presented in October 2016 with progressive angina.  He had stenting of the ostial LCx, with DES, stenting of mRCA with DES and POBA of the PDA. The prior stents were still patent.   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 He received RT and a gastroscopy feeding tube. In February scans revealed new hepatic metastases. A PortAcath was placed.  He was started on chemotherapy with cetuximab but suffered a cardiac arrest related to an anaphylactic reaction to chemo. During arrest he suffered multiple rib fractures due to CPR. He had transient Afib (after receiving IV epinephrine) that converted to NSR. He was started on low dose metoprolol. Discharged 04/06/26.   On his follow up with oncology in August it was noted that PET scan showed persistent metastatic disease in the caudate lobe of the liver but this had not grown. Noted patient had stopped a lot of his medications including statin and ASA and had acquired lymphedema in neck.  On follow up today he is feeling better. Taking most calories by mouth now. Only using tube feeds when tongue is really sore. No chest pain or dyspnea. He has reduced insulin by half and sugars still doing well.   Past Medical History:  Diagnosis Date  . Anemia   . CAD (coronary artery disease)    a. 2008 PCI: Taxus DES  to mRCA, prox & mid LCFX;  b. 10/2014 MV: extensive ischemia in the RCA territory and  small area of anterior ischemia;  c. 11/2014 PCI: LM nl, LAD 30ost/prox, 80d, LCX 90ost (3.0x12 Promus Prem DES), OM2 30, RCA 20m (3.0x12 Promus Prem DES), 48m, RPDA 90 (PTCA), EF 55-65%.  . Cancer ([place])    Head & neck ca  . Diverticulosis of colon   . Heart murmur   . History of colon polyps    2014--  hyperplastic, adenoma, and benign polyps  . History of radiation therapy 10/01/15- 11/20/15   Oropharynx/base of neck 70 Gy in 35 fractions  . Hyperlipidemia   . Hypertension   . Myocardial infarction ([place])   . Nonproliferative diabetic retinopathy ([place])   . Obesity   . Peripheral neuropathy   . Pneumonia    hx  . Prostate cancer ([place])   . RBBB (right bundle branch block)   . Skin cancer   . Type 2 diabetes mellitus ([place])     Past Surgical History:  Procedure Laterality Date  . APPENDECTOMY    . CARDIAC CATHETERIZATION N/A 11/13/2014   Procedure: Left Heart Cath and Coronary Angiography;  Surgeon: [name] [name], MD;  Location: [facility];  Service: Cardiovascular;  Laterality: N/A;  . CARDIAC CATHETERIZATION  11/13/2014   Procedure: Coronary Stent Intervention;  Surgeon: [name] [name], MD;  Location: [facility];  Service: Cardiovascular;;  . CARDIOVASCULAR STRESS TEST  01-17-2012  dr [name]   Normal perfusion study/ no ischemia or infarction/  normal LVF and wall motion, ef 54%  . CORONARY ANGIOPLASTY WITH STENT PLACEMENT  05/04/2006   dr [name]   Severe 2-vessel obstructive CAD/  normal LVF/  PCI with DES to proxima and mid CFX and mRCA (total 3 DES)  . CRYOABLATION N/A 04/01/2014   Procedure: CRYO ABLATION PROSTATE;  Surgeon: [name], MD;  Location: [facility];  Service: Urology;  Laterality: N/A;  . DIRECT LARYNGOSCOPY N/A 09/19/2015   Procedure: DIRECT LARYNGOSCOPY;  Surgeon: Izora Gala, MD;  Location: [facility];  Service: ENT;  Laterality: N/A;  . ESOPHAGOSCOPY N/A 09/19/2015   Procedure: ESOPHAGOSCOPY;  Surgeon: Izora Gala, MD;   Location: [facility];  Service: ENT;  Laterality: N/A;  . FLOOR OF MOUTH BIOPSY N/A 09/19/2015   Procedure: FLOOR OF MOUTH BIOPSY;  Surgeon: Izora Gala, MD;  Location: [facility];  Service: ENT;  Laterality: N/A;  . INSERTION OF SUPRAPUBIC CATHETER N/A 04/01/2014   Procedure: INSERTION OF SUPRAPUBIC CATHETER;  Surgeon: [name], MD;  Location: [facility];  Service: Urology;  Laterality: N/A;  suprapubic   . IR GENERIC HISTORICAL  11/13/2015   IR GASTROSTOMY TUBE MOD SED 11/13/2015 [name], DO [facility]  . IR GENERIC HISTORICAL  03/26/2016   IR US GUIDE VASC ACCESS RIGHT 03/26/2016 [name], MD [facility]  . IR GENERIC HISTORICAL  03/26/2016   IR FLUORO GUIDE PORT INSERTION RIGHT 03/26/2016 [name], MD [facility]  . MULTIPLE TOOTH EXTRACTIONS  09/17/2015  . PARTIAL COLECTOMY  80's     Current Outpatient Prescriptions  Medication Sig Dispense Refill  . aspirin EC 81 MG tablet Take 81 mg by mouth at bedtime.     . clopidogrel (PLAVIX) 75 MG tablet TAKE 1 TABLET EVERY DAY 90 tablet 0  . mirtazapine (REMERON) 15 MG tablet Take 1 tablet (15 mg total) by mouth at bedtime. 30 tablet 11  . insulin glargine (LANTUS) 100 UNIT/ML injection Inject 0.5 mLs (50 Units total) into the skin at bedtime. 10 mL    No current facility-administered medications for this visit.     Allergies:   Cetuximab    Social History:  The patient  reports that he has never smoked. He has never used smokeless tobacco. He reports that he drinks alcohol. He reports that he does not use drugs.   Family History:  The patient's family history includes Cancer in his father; Diverticulitis in his mother; Heart failure in his father.    ROS:  As noted in HPI. All other systems are reviewed and are negative.  Wt Readings from Last 3 Encounters:  11/04/16 177 lb (80.3 kg)  09/21/16 185 lb 6.4 oz (84.1 kg)  07/12/16 181 lb 4.8 oz (82.2 kg)     PHYSICAL EXAM: VS:  BP 118/70   Pulse  88   Ht 5\' 8"  (1.727 m)   Wt 177 lb (80.3 kg)   BMI 26.91 kg/m  , BMI Body mass index is 26.91 kg/m. GENERAL:  Well appearing WM in NAD HEENT:  PERRL, EOMI, sclera are clear. Oropharynx is clear. Tongue is somewhat raw. NECK:  No jugular venous distention, carotid upstroke brisk and symmetric, no bruits, no thyromegaly or adenopathy LUNGS:  Clear to auscultation bilaterally CHEST:  Unremarkable HEART:  RRR,  PMI not displaced or sustained,S1 and S2 within normal limits, no S3, no S4: no clicks, no rubs, no murmurs ABD:  Soft, nontender. BS +, no masses or bruits. No hepatomegaly, no splenomegaly EXT:  2 + pulses throughout, no edema, no cyanosis no clubbing SKIN:  Warm and dry.  No rashes NEURO:  Alert and oriented x 3. Cranial nerves II through XII intact. PSYCH:  Cognitively intact     Recent Labs: 04/19/2016: Magnesium 1.8 05/10/2016: TSH 2.685 09/20/2016: ALT 18; BUN 16.0; Creatinine 0.8; HGB 15.4; Platelets 146; Potassium 3.7; Sodium 142  Dated 09/15/15: A1c 9.7%   Lipid Panel No results found for: CHOL, TRIG, HDL, CHOLHDL, VLDL, LDLCALC, LDLDIRECT   Last lipid panel 07/26/14: cholesterol 146, triglycerides 398, LDL 34, HDL 32.   Other studies Reviewed: Additional studies/ records that were reviewed today include:   Cardiac cath October 2016: Procedures    Coronary Stent Intervention   Left Heart Cath and Coronary Angiography    Conclusion     Ost LAD to Prox LAD lesion, 30% stenosed.  Dist LAD lesion, 80% stenosed.  2nd Mrg lesion, 30% stenosed.  Ost Cx lesion, 90% stenosed.  The left ventricular systolic function is normal.  Ost Cx to Prox Cx lesion, 90% stenosed. This is proximal the the prior stent. There is a 0% residual stenosis post intervention.  RPDA lesion, 90% stenosed. There is a 10% residual stenosis post intervention.  Mid RCA-1 lesion, 90% stenosed. There is a 0% residual stenosis post intervention. A drug-eluting stent was placed. This is  proximal to the prior stent.  Mid RCA-1 lesion, 90% stenosed.  A drug-eluting stent was placed.  A drug-eluting stent was placed.  1. Severe 2 vessel obstructive CAD  90% ostial LCx. Prior stent in the proximal to mid LCx is patent.  90% mid RCA proximal to prior stent. The old stent is patent  90% PDA 2. Normal LV function 3. Successful stenting of the ostial LCx with a DES 4. Successful stenting of the mid RCA with a DES  5. Successful POBA of the PDA.  Plan: continue DAPT indefinitely. Anticipate DC in am.     Echo: 05/22/14:Study Conclusions  - Left ventricle: The cavity size was normal. There was mild concentric hypertrophy. Systolic function was normal. The estimated ejection fraction was in the range of 60% to 65%. Wall motion was normal; there were no regional wall motion abnormalities. There was an increased relative contribution of atrial contraction to ventricular filling. Doppler parameters are consistent with abnormal left ventricular relaxation (grade 1 diastolic dysfunction). - Aortic valve: Moderate diffuse thickening and calcification, consistent with sclerosis. - Mitral valve: Calcified annulus. Moderate focal calcification of the anterior leaflet. There was mild regurgitation. - Pericardium, extracardiac: A trivial pericardial effusion was identified posterior to the heart.  ASSESSMENT AND PLAN:  1.  CAD  s/p DES stents to ostial LCX, and mRCA and angioplasty to PDA in October 2016.  Recommend taking Plavix only for now.  He is off ASA now. He is asymptomatic.  2.   Hyperlipidemia- he is off statin now. Resistant to resuming at this time. If he continues to do well from a cancer standpoint we will discuss resuming on next visit.   3.  Essential HTN  BP is well controlled.   4. DM followed by Dr. Buddy Duty    FU: 6 months.  Signed, [name], MD  11/04/2016 2:52 PM    [facility] [place], [address] [place], [place] Phone: [phone]; Fax: [phone]

## 2016-11-02 ENCOUNTER — Ambulatory Visit (HOSPITAL_BASED_OUTPATIENT_CLINIC_OR_DEPARTMENT_OTHER): Payer: Medicare Other

## 2016-11-02 DIAGNOSIS — C787 Secondary malignant neoplasm of liver and intrahepatic bile duct: Secondary | ICD-10-CM | POA: Diagnosis not present

## 2016-11-02 DIAGNOSIS — Z452 Encounter for adjustment and management of vascular access device: Secondary | ICD-10-CM

## 2016-11-02 DIAGNOSIS — C01 Malignant neoplasm of base of tongue: Secondary | ICD-10-CM

## 2016-11-02 DIAGNOSIS — R110 Nausea: Secondary | ICD-10-CM

## 2016-11-02 MED ORDER — SODIUM CHLORIDE 0.9% FLUSH
10.0000 mL | Freq: Once | INTRAVENOUS | Status: AC
  Administered 2016-11-02: 10 mL
  Filled 2016-11-02: qty 10

## 2016-11-02 MED ORDER — HEPARIN SOD (PORK) LOCK FLUSH 100 UNIT/ML IV SOLN
500.0000 [IU] | Freq: Once | INTRAVENOUS | Status: AC
  Administered 2016-11-02: 500 [IU]
  Filled 2016-11-02: qty 5

## 2016-11-04 ENCOUNTER — Ambulatory Visit (INDEPENDENT_AMBULATORY_CARE_PROVIDER_SITE_OTHER): Payer: Medicare Other | Admitting: Cardiology

## 2016-11-04 ENCOUNTER — Encounter: Payer: Self-pay | Admitting: Cardiology

## 2016-11-04 VITALS — BP 118/70 | HR 88 | Ht 68.0 in | Wt 177.0 lb

## 2016-11-04 DIAGNOSIS — Z794 Long term (current) use of insulin: Secondary | ICD-10-CM | POA: Diagnosis not present

## 2016-11-04 DIAGNOSIS — E7800 Pure hypercholesterolemia, unspecified: Secondary | ICD-10-CM

## 2016-11-04 DIAGNOSIS — Z9861 Coronary angioplasty status: Secondary | ICD-10-CM | POA: Diagnosis not present

## 2016-11-04 DIAGNOSIS — E1165 Type 2 diabetes mellitus with hyperglycemia: Secondary | ICD-10-CM

## 2016-11-04 DIAGNOSIS — IMO0001 Reserved for inherently not codable concepts without codable children: Secondary | ICD-10-CM

## 2016-11-04 DIAGNOSIS — I2510 Atherosclerotic heart disease of native coronary artery without angina pectoris: Secondary | ICD-10-CM | POA: Diagnosis not present

## 2016-11-04 DIAGNOSIS — I10 Essential (primary) hypertension: Secondary | ICD-10-CM | POA: Diagnosis not present

## 2016-11-04 MED ORDER — INSULIN GLARGINE 100 UNIT/ML ~~LOC~~ SOLN: 50.0000 [IU] | Freq: Every day | SUBCUTANEOUS | Status: AC

## 2016-11-04 NOTE — Patient Instructions (Addendum)
Continue Plavix daily  I will see you in 6 months.

## 2016-11-05 ENCOUNTER — Telehealth: Payer: Self-pay | Admitting: *Deleted

## 2016-11-05 NOTE — Telephone Encounter (Signed)
FAXED NOTES TO [facility] 

## 2016-11-11 ENCOUNTER — Telehealth: Payer: Self-pay

## 2016-11-11 ENCOUNTER — Other Ambulatory Visit: Payer: Self-pay | Admitting: Hematology and Oncology

## 2016-11-11 DIAGNOSIS — C01 Malignant neoplasm of base of tongue: Secondary | ICD-10-CM

## 2016-11-11 NOTE — Telephone Encounter (Signed)
Patient called and left message to call him. Called him back, he would like to get his tube feeding out.  He has not used it in 2 weeks. Eating and drinking okay. Weight is 178 lbs. Tube feeding is starting to feel irritated, states that he still flushing and changing dressing.  He is drinking a lot of milk, and states he has a problem drinking water. States that he is going to try to drink water cold and try to drink more water. He has a dry mouth in the morning only. Tongue is red and has a irritated feeling. Spicy and salty food make his tongue hurt, he is trying to avoid those foods.

## 2016-11-11 NOTE — Telephone Encounter (Signed)
Called with below message. 

## 2016-11-11 NOTE — Telephone Encounter (Signed)
Order is placed.

## 2016-11-17 ENCOUNTER — Encounter (HOSPITAL_COMMUNITY): Payer: Self-pay | Admitting: Radiology

## 2016-11-17 ENCOUNTER — Ambulatory Visit (HOSPITAL_COMMUNITY)
Admission: RE | Admit: 2016-11-17 | Discharge: 2016-11-17 | Disposition: A | Discharge status: Home / Self Care | Payer: Medicare Other | Source: Ambulatory Visit | Attending: Hematology and Oncology | Admitting: Hematology and Oncology

## 2016-11-17 DIAGNOSIS — Z85810 Personal history of malignant neoplasm of tongue: Secondary | ICD-10-CM | POA: Insufficient documentation

## 2016-11-17 DIAGNOSIS — Z431 Encounter for attention to gastrostomy: Secondary | ICD-10-CM | POA: Insufficient documentation

## 2016-11-17 DIAGNOSIS — C01 Malignant neoplasm of base of tongue: Secondary | ICD-10-CM

## 2016-11-17 HISTORY — PX: IR GASTROSTOMY TUBE REMOVAL: IMG5492

## 2016-11-17 MED ORDER — LIDOCAINE VISCOUS 2 % MT SOLN
OROMUCOSAL | Status: DC | PRN
  Administered 2016-11-17: 15 mL via OROMUCOSAL

## 2016-11-17 MED ORDER — LIDOCAINE VISCOUS 2 % MT SOLN
OROMUCOSAL | Status: AC
Start: 2016-11-17 — End: 2016-11-17
  Filled 2016-11-17: qty 15

## 2016-11-17 NOTE — Procedures (Signed)
Patient's 70 French pull-through gastrostomy tube was removed in its entirety. No immediate complications. Gauze dressing applied to site. Medication used-viscous lidocaine into the gastrostomy tube insertion site tract.

## 2016-12-09 ENCOUNTER — Ambulatory Visit (HOSPITAL_COMMUNITY)
Admission: RE | Admit: 2016-12-09 | Discharge: 2016-12-09 | Disposition: A | Discharge status: Home / Self Care | Payer: Medicare Other | Source: Ambulatory Visit | Attending: Hematology and Oncology | Admitting: Hematology and Oncology

## 2016-12-09 DIAGNOSIS — I700 Atherosclerosis of aorta: Secondary | ICD-10-CM | POA: Diagnosis not present

## 2016-12-09 DIAGNOSIS — C787 Secondary malignant neoplasm of liver and intrahepatic bile duct: Secondary | ICD-10-CM | POA: Diagnosis present

## 2016-12-09 DIAGNOSIS — C029 Malignant neoplasm of tongue, unspecified: Secondary | ICD-10-CM | POA: Diagnosis not present

## 2016-12-09 DIAGNOSIS — I2510 Atherosclerotic heart disease of native coronary artery without angina pectoris: Secondary | ICD-10-CM | POA: Insufficient documentation

## 2016-12-09 LAB — GLUCOSE, CAPILLARY
GLUCOSE-CAPILLARY: 56 mg/dL — AB (ref 65–99)
Glucose-Capillary: 57 mg/dL — ABNORMAL LOW (ref 65–99)

## 2016-12-09 MED ORDER — FLUDEOXYGLUCOSE F - 18 (FDG) INJECTION
8.7900 | Freq: Once | INTRAVENOUS | Status: AC | PRN
  Administered 2016-12-09: 8.79 via INTRAVENOUS

## 2016-12-14 ENCOUNTER — Ambulatory Visit (HOSPITAL_BASED_OUTPATIENT_CLINIC_OR_DEPARTMENT_OTHER): Payer: Medicare Other

## 2016-12-14 ENCOUNTER — Ambulatory Visit (HOSPITAL_BASED_OUTPATIENT_CLINIC_OR_DEPARTMENT_OTHER): Payer: Medicare Other | Admitting: Hematology and Oncology

## 2016-12-14 ENCOUNTER — Other Ambulatory Visit (HOSPITAL_BASED_OUTPATIENT_CLINIC_OR_DEPARTMENT_OTHER): Payer: Medicare Other

## 2016-12-14 ENCOUNTER — Telehealth: Payer: Self-pay | Admitting: Hematology and Oncology

## 2016-12-14 ENCOUNTER — Encounter: Payer: Self-pay | Admitting: Hematology and Oncology

## 2016-12-14 DIAGNOSIS — Z7189 Other specified counseling: Secondary | ICD-10-CM

## 2016-12-14 DIAGNOSIS — C029 Malignant neoplasm of tongue, unspecified: Secondary | ICD-10-CM

## 2016-12-14 DIAGNOSIS — C787 Secondary malignant neoplasm of liver and intrahepatic bile duct: Secondary | ICD-10-CM

## 2016-12-14 DIAGNOSIS — C01 Malignant neoplasm of base of tongue: Secondary | ICD-10-CM

## 2016-12-14 DIAGNOSIS — C772 Secondary and unspecified malignant neoplasm of intra-abdominal lymph nodes: Secondary | ICD-10-CM

## 2016-12-14 DIAGNOSIS — C779 Secondary and unspecified malignant neoplasm of lymph node, unspecified: Secondary | ICD-10-CM | POA: Diagnosis not present

## 2016-12-14 DIAGNOSIS — C770 Secondary and unspecified malignant neoplasm of lymph nodes of head, face and neck: Secondary | ICD-10-CM

## 2016-12-14 DIAGNOSIS — R110 Nausea: Secondary | ICD-10-CM

## 2016-12-14 LAB — CBC WITH DIFFERENTIAL/PLATELET
BASO%: 0.4 % (ref 0.0–2.0)
BASOS ABS: 0 10*3/uL (ref 0.0–0.1)
EOS%: 0.7 % (ref 0.0–7.0)
Eosinophils Absolute: 0 10*3/uL (ref 0.0–0.5)
HCT: 45.7 % (ref 38.4–49.9)
HGB: 15 g/dL (ref 13.0–17.1)
LYMPH%: 8.5 % — AB (ref 14.0–49.0)
MCH: 28.7 pg (ref 27.2–33.4)
MCHC: 32.8 g/dL (ref 32.0–36.0)
MCV: 87.7 fL (ref 79.3–98.0)
MONO#: 0.4 10*3/uL (ref 0.1–0.9)
MONO%: 5.6 % (ref 0.0–14.0)
NEUT#: 5.7 10*3/uL (ref 1.5–6.5)
NEUT%: 84.8 % — ABNORMAL HIGH (ref 39.0–75.0)
PLATELETS: 170 10*3/uL (ref 140–400)
RBC: 5.21 10*6/uL (ref 4.20–5.82)
RDW: 14.8 % — ABNORMAL HIGH (ref 11.0–14.6)
WBC: 6.7 10*3/uL (ref 4.0–10.3)
lymph#: 0.6 10*3/uL — ABNORMAL LOW (ref 0.9–3.3)

## 2016-12-14 LAB — COMPREHENSIVE METABOLIC PANEL
ALT: 17 U/L (ref 0–55)
ANION GAP: 7 meq/L (ref 3–11)
AST: 16 U/L (ref 5–34)
Albumin: 3.6 g/dL (ref 3.5–5.0)
Alkaline Phosphatase: 92 U/L (ref 40–150)
BUN: 15.2 mg/dL (ref 7.0–26.0)
CALCIUM: 9.5 mg/dL (ref 8.4–10.4)
CHLORIDE: 106 meq/L (ref 98–109)
CO2: 27 meq/L (ref 22–29)
CREATININE: 0.9 mg/dL (ref 0.7–1.3)
EGFR: >60 mL/min/{1.73_m2} (ref >60)
Glucose: 114 mg/dl (ref 70–140)
POTASSIUM: 3.8 meq/L (ref 3.5–5.1)
Sodium: 141 mEq/L (ref 136–145)
Total Bilirubin: 0.55 mg/dL (ref 0.20–1.20)
Total Protein: 6.8 g/dL (ref 6.4–8.3)

## 2016-12-14 MED ORDER — HEPARIN SOD (PORK) LOCK FLUSH 100 UNIT/ML IV SOLN
500.0000 [IU] | Freq: Once | INTRAVENOUS | Status: AC
  Administered 2016-12-14: 500 [IU]
  Filled 2016-12-14: qty 5

## 2016-12-14 MED ORDER — SODIUM CHLORIDE 0.9% FLUSH
10.0000 mL | Freq: Once | INTRAVENOUS | Status: AC
  Administered 2016-12-14: 10 mL
  Filled 2016-12-14: qty 10

## 2016-12-14 NOTE — Telephone Encounter (Signed)
Scheduled appt pe r1/16 los - Gave patient AVS and calender per los.  

## 2016-12-14 NOTE — Progress Notes (Signed)
DISCONTINUE ON PATHWAY REGIMEN - Head and Neck   5-Fluorouracil + Carboplatin + Cetuximab cycle q21 days:   A cycle is every 21 days:     Carboplatin        Dose Mod: None     5-Fluorouracil        Dose Mod: None     Cetuximab        Dose Mod: None     Cetuximab        Dose Mod: None  **Always confirm dose/schedule in your pharmacy ordering system**    Cetuximab Maintenance (4 weeks per order sheet):   Administer weekly:     Cetuximab        Dose Mod: None  **Always confirm dose/schedule in your pharmacy ordering system**    REASON: Disease Progression PRIOR TREATMENT: [id number]: Carboplatin AUC=5 D1 + 5-Fluorouracil 1,000 mg/m2/d CIV D1-4 + Cetuximab 250 mg/m2 D1, 8, 15 q21 Days For Maximum of 6 Cycles Followed by Maintenance Cetuximab 250 mg/m2 Weekly TREATMENT RESPONSE: Progressive Disease (PD)  START OFF PATHWAY REGIMEN - Head and Neck   [id number]:Carboplatin + Paclitaxel (5/175) q21 Days:   A cycle is every 21 days:     Paclitaxel      Carboplatin   **Always confirm dose/schedule in your pharmacy ordering system**    Administration Notes: for weekly carboplatin & Taxol  Patient Characteristics: Oropharynx, HPV Positive, Metastatic (Squamous Cell), Second Line Disease Classification: Oropharynx HPV Status: Positive (+) AJCC N Category: cN2 AJCC 8 Stage Grouping: IV Current Disease Status: Metastatic Disease AJCC T Category: T1 AJCC M Category: M1 Line of therapy: Second Line Would you be surprised if this patient died  in the next year<= I would be surprised if this patient died in the next year Intent of Therapy: Non-Curative / Palliative Intent, Discussed with Patient

## 2016-12-15 DIAGNOSIS — C779 Secondary and unspecified malignant neoplasm of lymph node, unspecified: Secondary | ICD-10-CM | POA: Insufficient documentation

## 2016-12-15 NOTE — Assessment & Plan Note (Signed)
We reviewed the imaging study He is not symptomatic and his liver function is preserved

## 2016-12-15 NOTE — Assessment & Plan Note (Signed)
We reviewed imaging studies together. The patient is disappointed to see recurrence/progression of disease He is tired of dealing with "recurrence of disease". I reminded the patient, when we discontinued chemotherapy 3 months ago, he was never cancer free. 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 We discussed possibility of second opinion to consider surgical resection (which I do not recommend), or referral to his radiation oncologist to discuss possibility of localized radiation therapy, (again which I do not recommend) given his metastatic nature of disease. The patient declined second opinion. He ultimately accepted the fact he would need to be on systemic treatment on and off for the rest of his life. I reviewed with him current guidelines and discussed treatment options. We discussed possibility of combination chemotherapy, single agent chemotherapy versus immunotherapy. Some of the expected risks, benefits, side effects of chemotherapy were discussed with the patient.  Ultimately, he is willing to resume treatment soon We discussed the role of chemotherapy. The intent is of curative intent.  We discussed some of the risks, benefits, side-effects of carboplatin and Paclitaxel  Some of the short term side-effects included, though not limited to, including weight loss, life threatening infections, risk of allergic reactions, need for transfusions of blood products, nausea, vomiting, change in bowel habits, loss of hair, admission to hospital for various reasons, and risks of death.   Long term side-effects are also discussed including risks of infertility, permanent damage to nerve function, hearing loss, chronic fatigue, kidney damage with possibility needing hemodialysis, and rare secondary malignancy including bone marrow disorders.  The patient is aware that the response rates discussed earlier is not guaranteed.  After a long discussion, patient made an  informed decision to proceed with the prescribed plan of care.   The patient could not tolerate carboplatin and infusional 5-FU but with weekly carboplatin and Taxol, I think he would tolerate that better. I plan to schedule treatment on a weekly basis, with cycle of every 28 days and treatment on days 1, 8, 15 and rest day 22.

## 2016-12-15 NOTE — Assessment & Plan Note (Signed)
He is not symptomatic.  This is likely due to disease progression.

## 2016-12-15 NOTE — Assessment & Plan Note (Signed)
The patient is aware he has incurable disease and treatment is strictly palliative. We discussed importance of Advanced Directives and Living will. We discussed prognosis with or without chemotherapy.

## 2016-12-15 NOTE — Progress Notes (Signed)
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 Biopsy recommended. No definite pharyngeal mass lesion identified by CT.       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 No other primary or metastatic cancer. 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         11/13/2015 Procedure    Placement of 20 French pull-through percutaneous gastrostomy tube.      03/11/2016 PET scan    Resolution of hypermetabolic right cervical lymphadenopathy since prior study . No other hypermetabolic lymphadenopathy identified. New hypermetabolic lesion and caudate lobe of liver, highly suspicious for liver metastasis. Recommend liver protocol abdomen MRI without and with contrast for further evaluation. New patchy airspace disease in  both lung apices with FDG uptake. 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 Recommend continued attention on follow-up imaging.       03/19/2016 Imaging    MRI showed 3.4 cm heterogeneously enhancing mass in caudate lobe of liver, consistent liver metastasis. No other sites of metastatic disease identified within the liver or abdomen      03/26/2016 Procedure    Ultrasound and fluoroscopically guided right internal jugular single lumen power port catheter insertion. Tip in the SVC/RA junction. 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 Metabolic response to therapy of caudate lobe liver metastasis. Low-level activity in this area is similar to the surrounding liver.  2. Hypermetabolism in the right inguinal crease without concurrent adenopathy. Especially given subtle surrounding edema, favor cellulitis. Consider physical exam correlation. 3. Lack of visualization of previously described 7 mm left lower lobe pulmonary nodule. 4. Diffuse marrow hypermetabolism, favoring stimulation by chemotherapy.      09/20/2016 PET scan  1. Reduced activity in the hypermetabolic caudate lobe lesion of the liver, likely a metastatic lesion. 2. Scattered likely inflammatory activity along the glenohumeral and acromioclavicular joints and in the hips. 3. Healing of bilateral upper rib fractures. 4. Other imaging findings of potential clinical significance: Aortic Atherosclerosis (ICD10-I70.0). Coronary and  carotid atherosclerosis. Photopenic right renal cysts.      11/17/2016 Procedure    Successful removal of 20 French pull-through gastrostomy tube.      12/09/2016 PET scan    1. Slight increase in size and degree of uptake associated with caudate lobe lesion consistent with metastatic disease 2. Increase in size and FDG uptake associated with portacaval lymph node 3. Aortic Atherosclerosis (ICD10-I70.0). Coronary atherosclerotic changes noted.       Cancer of base of tongue ([place])    INTERVAL HISTORY: Please see below for problem oriented charting. He returns with his wife for further follow-up. He denies pain or abdominal discomfort. No nausea, vomiting or changes in bowel habits.  He continues to complain of fatigue.  He felt that he barely recover from recent chemotherapy several months ago  REVIEW OF SYSTEMS:   Constitutional: Denies fevers, chills or abnormal weight loss Eyes: Denies blurriness of vision Ears, nose, mouth, throat, and face: Denies mucositis or sore throat Respiratory: Denies cough, dyspnea or wheezes Cardiovascular: Denies palpitation, chest discomfort or lower extremity swelling Gastrointestinal:  Denies nausea, heartburn or change in bowel habits Skin: Denies abnormal skin rashes Lymphatics: Denies new lymphadenopathy or easy bruising Neurological:Denies numbness, tingling or new weaknesses Behavioral/Psych: Mood is stable, no new changes  All other systems were reviewed with the patient and are negative.  I have reviewed the past medical history, past surgical history, social history and family history with the patient and they are unchanged from previous note.  ALLERGIES:  is allergic to cetuximab.  MEDICATIONS:  Current Outpatient Medications  Medication Sig Dispense Refill  . aspirin EC 81 MG tablet Take 81 mg by mouth at bedtime.     . clopidogrel (PLAVIX) 75 MG tablet TAKE 1 TABLET EVERY DAY 90 tablet 0  . insulin glargine (LANTUS) 100 UNIT/ML  injection Inject 0.5 mLs (50 Units total) into the skin at bedtime. 10 mL   . mirtazapine (REMERON) 15 MG tablet Take 1 tablet (15 mg total) by mouth at bedtime. 30 tablet 11   No current facility-administered medications for this visit.     PHYSICAL EXAMINATION: ECOG PERFORMANCE STATUS: 1 - Symptomatic but completely ambulatory  Vitals:   12/14/16 1123  BP: (!) 150/54  Pulse: (!) 30  Resp: 16  Temp: 98.4 F (36.9 C)  SpO2: 98%   Filed Weights   12/14/16 1123  Weight: 176 lb 4.8 oz (80 kg)    GENERAL:alert, no distress and comfortable SKIN: skin color, texture, turgor are normal, no rashes or significant lesions EYES: normal, Conjunctiva are pink and non-injected, sclera clear Musculoskeletal:no cyanosis of digits and no clubbing  NEURO: alert & oriented x 3 with fluent speech, no focal motor/sensory deficits  LABORATORY DATA:  I have reviewed the data as listed    Component Value Date/Time   NA 141 12/14/2016 1104   K 3.8 12/14/2016 1104   CL 99 (L) 04/12/2016 1211   CO2 27 12/14/2016 1104   GLUCOSE 114 12/14/2016 1104   BUN 15.2 12/14/2016 1104   CREATININE 0.9 12/14/2016 1104   CALCIUM 9.5 12/14/2016 1104   PROT 6.8 12/14/2016 1104   ALBUMIN 3.6 12/14/2016 1104   AST 16  12/14/2016 1104   ALT 17 12/14/2016 1104   ALKPHOS 92 12/14/2016 1104   BILITOT 0.55 12/14/2016 1104   GFRNONAA >60 04/05/2016 2014   GFRAA >60 04/05/2016 2014    No results found for: SPEP, UPEP  Lab Results  Component Value Date   WBC 6.7 12/14/2016   NEUTROABS 5.7 12/14/2016   HGB 15.0 12/14/2016   HCT 45.7 12/14/2016   MCV 87.7 12/14/2016   PLT 170 12/14/2016      Chemistry      Component Value Date/Time   NA 141 12/14/2016 1104   K 3.8 12/14/2016 1104   CL 99 (L) 04/12/2016 1211   CO2 27 12/14/2016 1104   BUN 15.2 12/14/2016 1104   CREATININE 0.9 12/14/2016 1104   GLU 78 11/13/2015 1134      Component Value Date/Time   CALCIUM 9.5 12/14/2016 1104   ALKPHOS 92  12/14/2016 1104   AST 16 12/14/2016 1104   ALT 17 12/14/2016 1104   BILITOT 0.55 12/14/2016 1104       RADIOGRAPHIC STUDIES: I have reviewed imaging study with the patient and his wife I have personally reviewed the radiological images as listed and agreed with the findings in the report. Nm Pet Image Restag (ps) Skull Base To Thigh  Result Date: 12/09/2016 CLINICAL DATA:  Subsequent treatment strategy for tongue cancer. EXAM: NUCLEAR MEDICINE PET SKULL BASE TO THIGH TECHNIQUE: 8.75 mCi F-18 FDG was injected intravenously. Full-ring PET imaging was performed from the skull base to thigh after the radiotracer. CT data was obtained and used for attenuation correction and anatomic localization. FASTING BLOOD GLUCOSE:  Value: 57 mg/dl COMPARISON:  09/20/16 FINDINGS: NECK: No hypermetabolic lymph nodes in the neck. CHEST: Normal heart size. Aortic atherosclerosis noted. Calcifications in the RCA, LAD and left circumflex coronary arteries noted. No hypermetabolic mediastinal or hilar lymph nodes. No hypermetabolic supraclavicular or axillary lymph nodes. No hypermetabolic pulmonary nodule or mass. ABDOMEN/PELVIS: The low-attenuation lesion in the caudate lobe of liver measures 2.3 x 3.2 cm and has an SUV max equal to 11.8. On the previous exam this measured 2.2 x 3.0 cm within SUV max equal to 7.3. Small portacaval lymph node measures 1.1 cm and has an SUV max equal to 5.68. On the previous exam this measured 0.5 cm and had an SUV max equal to 2.8. Aortic atherosclerosis. No aneurysm. Right kidney cysts again noted. SKELETON: No focal hypermetabolic activity to suggest skeletal metastasis. IMPRESSION: 1. Slight increase in size and degree of uptake associated with caudate lobe lesion consistent with metastatic disease 2. Increase in size and FDG uptake associated with portacaval lymph node 3. Aortic Atherosclerosis (ICD10-I70.0). Coronary atherosclerotic changes noted. Electronically Signed   By: Kerby Moors  M.D.   On: 12/09/2016 14:38   Ir Gastrostomy Tube Removal  Result Date: 11/17/2016 INDICATION: Patient with history of base of tongue cancer, status post gastrostomy tube on 11/13/15. He is currently eating and request now received for gastrostomy tube removal. EXAM: GASTROSTOMY TUBE REMOVAL MEDICATIONS: Viscous lidocaine into gastrostomy tube insertion site tract ANESTHESIA/SEDATION: None CONTRAST:  None FLUOROSCOPY TIME:  None COMPLICATIONS: None immediate. PROCEDURE: Informed consent was obtained from the patient after a thorough discussion of the procedural risks, benefits and alternatives. All questions were addressed. A timeout was performed prior to the initiation of the procedure. The 52 French pull-through gastrostomy tube was removed in its entirety without immediate complications. Gauze dressing applied over site. IMPRESSION: Successful removal of 20 French pull-through gastrostomy tube. Read by: Lennette Bihari  Allred, PA-C Electronically Signed   By: Aletta Edouard M.D.   On: 11/17/2016 11:23    ASSESSMENT & PLAN:  Tongue cancer ([place]) We reviewed imaging studies together. The patient is disappointed to see recurrence/progression of disease He is tired of dealing with "recurrence of disease". I reminded the patient, when we discontinued chemotherapy 3 months ago, he was never cancer free. 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 We discussed possibility of second opinion to consider surgical resection (which I do not recommend), or referral to his radiation oncologist to discuss possibility of localized radiation therapy, (again which I do not recommend) given his metastatic nature of disease. The patient declined second opinion. He ultimately accepted the fact he would need to be on systemic treatment on and off for the rest of his life. I reviewed with him current guidelines and discussed treatment options. We discussed possibility of combination chemotherapy,  single agent chemotherapy versus immunotherapy. Some of the expected risks, benefits, side effects of chemotherapy were discussed with the patient.  Ultimately, he is willing to resume treatment soon We discussed the role of chemotherapy. The intent is of curative intent.  We discussed some of the risks, benefits, side-effects of carboplatin and Paclitaxel  Some of the short term side-effects included, though not limited to, including weight loss, life threatening infections, risk of allergic reactions, need for transfusions of blood products, nausea, vomiting, change in bowel habits, loss of hair, admission to hospital for various reasons, and risks of death.   Long term side-effects are also discussed including risks of infertility, permanent damage to nerve function, hearing loss, chronic fatigue, kidney damage with possibility needing hemodialysis, and rare secondary malignancy including bone marrow disorders.  The patient is aware that the response rates discussed earlier is not guaranteed.  After a long discussion, patient made an informed decision to proceed with the prescribed plan of care.   The patient could not tolerate carboplatin and infusional 5-FU but with weekly carboplatin and Taxol, I think he would tolerate that better. I plan to schedule treatment on a weekly basis, with cycle of every 28 days and treatment on days 1, 8, 15 and rest day 22.    Goals of care, counseling/discussion The patient is aware he has incurable disease and treatment is strictly palliative. We discussed importance of Advanced Directives and Living will. We discussed prognosis with or without chemotherapy.  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  This is likely due to disease progression.   No orders of the defined types were placed in this encounter.  All questions  were answered. The patient knows to call the clinic with any problems, questions or concerns. No barriers to learning was detected. I spent 60 minutes counseling the patient face to face. The total time spent in the appointment was 80 minutes and more than 50% was on counseling and review of test results     Heath Lark, MD 12/15/2016 8:51 AM

## 2016-12-27 ENCOUNTER — Ambulatory Visit (HOSPITAL_BASED_OUTPATIENT_CLINIC_OR_DEPARTMENT_OTHER): Payer: Medicare Other | Admitting: Medical

## 2016-12-27 ENCOUNTER — Other Ambulatory Visit (HOSPITAL_BASED_OUTPATIENT_CLINIC_OR_DEPARTMENT_OTHER): Payer: Medicare Other

## 2016-12-27 ENCOUNTER — Ambulatory Visit (HOSPITAL_BASED_OUTPATIENT_CLINIC_OR_DEPARTMENT_OTHER): Payer: Medicare Other

## 2016-12-27 VITALS — BP 140/68 | HR 60 | Temp 98.8°F | Resp 18

## 2016-12-27 DIAGNOSIS — C029 Malignant neoplasm of tongue, unspecified: Secondary | ICD-10-CM

## 2016-12-27 DIAGNOSIS — C787 Secondary malignant neoplasm of liver and intrahepatic bile duct: Secondary | ICD-10-CM

## 2016-12-27 DIAGNOSIS — R001 Bradycardia, unspecified: Secondary | ICD-10-CM

## 2016-12-27 DIAGNOSIS — C01 Malignant neoplasm of base of tongue: Secondary | ICD-10-CM

## 2016-12-27 DIAGNOSIS — Z5111 Encounter for antineoplastic chemotherapy: Secondary | ICD-10-CM

## 2016-12-27 DIAGNOSIS — C770 Secondary and unspecified malignant neoplasm of lymph nodes of head, face and neck: Secondary | ICD-10-CM

## 2016-12-27 LAB — CBC WITH DIFFERENTIAL/PLATELET
BASO%: 0.5 % (ref 0.0–2.0)
BASOS ABS: 0 10*3/uL (ref 0.0–0.1)
EOS ABS: 0.1 10*3/uL (ref 0.0–0.5)
EOS%: 1.4 % (ref 0.0–7.0)
HEMATOCRIT: 45.5 % (ref 38.4–49.9)
HGB: 15 g/dL (ref 13.0–17.1)
LYMPH#: 0.5 10*3/uL — AB (ref 0.9–3.3)
LYMPH%: 9.9 % — AB (ref 14.0–49.0)
MCH: 29.2 pg (ref 27.2–33.4)
MCHC: 33 g/dL (ref 32.0–36.0)
MCV: 88.4 fL (ref 79.3–98.0)
MONO#: 0.4 10*3/uL (ref 0.1–0.9)
MONO%: 8.3 % (ref 0.0–14.0)
NEUT#: 4.1 10*3/uL (ref 1.5–6.5)
NEUT%: 79.9 % — AB (ref 39.0–75.0)
PLATELETS: 160 10*3/uL (ref 140–400)
RBC: 5.15 10*6/uL (ref 4.20–5.82)
RDW: 14.7 % — ABNORMAL HIGH (ref 11.0–14.6)
WBC: 5.1 10*3/uL (ref 4.0–10.3)

## 2016-12-27 LAB — COMPREHENSIVE METABOLIC PANEL
ALT: 12 U/L (ref 0–55)
ANION GAP: 8 meq/L (ref 3–11)
AST: 15 U/L (ref 5–34)
Albumin: 3.6 g/dL (ref 3.5–5.0)
Alkaline Phosphatase: 93 U/L (ref 40–150)
BUN: 14.5 mg/dL (ref 7.0–26.0)
CALCIUM: 9.5 mg/dL (ref 8.4–10.4)
CHLORIDE: 105 meq/L (ref 98–109)
CO2: 27 mEq/L (ref 22–29)
CREATININE: 0.9 mg/dL (ref 0.7–1.3)
Glucose: 125 mg/dl (ref 70–140)
Potassium: 3.4 mEq/L — ABNORMAL LOW (ref 3.5–5.1)
Sodium: 141 mEq/L (ref 136–145)
Total Bilirubin: 0.58 mg/dL (ref 0.20–1.20)
Total Protein: 6.8 g/dL (ref 6.4–8.3)

## 2016-12-27 MED ORDER — SODIUM CHLORIDE 0.9% FLUSH
10.0000 mL | INTRAVENOUS | Status: DC | PRN
  Administered 2016-12-27: 10 mL
  Filled 2016-12-27: qty 10

## 2016-12-27 MED ORDER — DIPHENHYDRAMINE HCL 50 MG/ML IJ SOLN
50.0000 mg | Freq: Once | INTRAMUSCULAR | Status: AC
  Administered 2016-12-27: 50 mg via INTRAVENOUS

## 2016-12-27 MED ORDER — PALONOSETRON HCL INJECTION 0.25 MG/5ML
0.2500 mg | Freq: Once | INTRAVENOUS | Status: AC
  Administered 2016-12-27: 0.25 mg via INTRAVENOUS

## 2016-12-27 MED ORDER — SODIUM CHLORIDE 0.9 % IV SOLN
198.8000 mg | Freq: Once | INTRAVENOUS | Status: AC
  Administered 2016-12-27: 200 mg via INTRAVENOUS
  Filled 2016-12-27: qty 20

## 2016-12-27 MED ORDER — DIPHENHYDRAMINE HCL 50 MG/ML IJ SOLN
INTRAMUSCULAR | Status: AC
  Filled 2016-12-27: qty 1

## 2016-12-27 MED ORDER — SODIUM CHLORIDE 0.9 % IV SOLN
45.0000 mg/m2 | Freq: Once | INTRAVENOUS | Status: AC
  Administered 2016-12-27: 90 mg via INTRAVENOUS
  Filled 2016-12-27: qty 15

## 2016-12-27 MED ORDER — SODIUM CHLORIDE 0.9 % IV SOLN
Freq: Once | INTRAVENOUS | Status: AC
  Administered 2016-12-27: 09:00:00 via INTRAVENOUS

## 2016-12-27 MED ORDER — SODIUM CHLORIDE 0.9 % IV SOLN
20.0000 mg | Freq: Once | INTRAVENOUS | Status: AC
  Administered 2016-12-27: 20 mg via INTRAVENOUS
  Filled 2016-12-27: qty 2

## 2016-12-27 MED ORDER — HEPARIN SOD (PORK) LOCK FLUSH 100 UNIT/ML IV SOLN
500.0000 [IU] | Freq: Once | INTRAVENOUS | Status: AC | PRN
  Administered 2016-12-27: 500 [IU]
  Filled 2016-12-27: qty 5

## 2016-12-27 MED ORDER — PALONOSETRON HCL INJECTION 0.25 MG/5ML
INTRAVENOUS | Status: AC
  Filled 2016-12-27: qty 5

## 2016-12-27 MED ORDER — FAMOTIDINE IN NACL 20-0.9 MG/50ML-% IV SOLN
20.0000 mg | Freq: Once | INTRAVENOUS | Status: AC
  Administered 2016-12-27: 20 mg via INTRAVENOUS

## 2016-12-27 MED ORDER — FAMOTIDINE IN NACL 20-0.9 MG/50ML-% IV SOLN
INTRAVENOUS | Status: AC
  Filled 2016-12-27: qty 50

## 2016-12-27 NOTE — Progress Notes (Signed)
Pt heart rate dropped to mid 30's taken manually,Infusion was stopped, pt alert denies any pain, dizziness  painNS wide open Lucianne Lei PA was paged.

## 2016-12-27 NOTE — Progress Notes (Signed)
Pt was evaluated by symptom management , infusion restarted at 12:55 with the rate of 199 x26mls,Infusion was completed without any issues. Vital signs stable.  Discharge instructions reviewed with the wife and verbalized understanding.

## 2016-12-27 NOTE — Progress Notes (Signed)
[facility] Progress Note   Benjamin Barr [id number] 1945-07-03 71 y.o.  Benjamin Barr is managed by Dr. Heath Lark  Actively treated with chemotherapy: yes  Current Therapy: Carboplatin and paclitaxel  Last Treated: 12/27/2016  Assessment: Plan:    Bradycardia  Cancer of base of tongue ([facility])   Bradycardia: Although the patient's pulse oximeter reported that his pulse rate was 27, a manual check of his pulse returned with a pulse of 56-60.  Metastatic cancer of the tongue: Dr. Alvy Bimler instructed that the nursing staff could proceed with the patient's chemotherapy today.  Please see After Visit Summary for patient specific instructions.  Future Appointments  Date Time Provider [place]  01/03/2017 10:15 AM [facility] LAB 3 [facility] None  01/03/2017 10:30 AM [facility] INJ NURSE [facility] None  01/03/2017 11:00 AM Gorsuch, Ni, MD [facility] None  01/03/2017 11:45 AM [facility] E16 [facility] None  01/11/2017  8:15 AM [facility] LAB 1 [facility] None  01/11/2017  8:30 AM [facility] FLUSH NURSE 2 [facility] None  01/11/2017  9:30 AM [facility] G22 [facility] None    No orders of the defined types were placed in this encounter.      Subjective:   Patient ID:  Benjamin Barr is a 71 y.o. (DOB 1945/05/03) male.  Chief Complaint: No chief complaint on file.   HPI Benjamin Barr is a 71 year old male with a history of a cancer of the tongue with metastatic disease to lymph nodes in the right supraclavicular region and liver.  He presents to the office today for cycle 1 day 1 of carboplatin and paclitaxel.  He previously experienced a cardiac arrest due to a severe anaphylaxis reaction to cetuximab.  He also has a significant history of heart disease and is status post a percutaneous coronary angioplasty and has hypertension, unstable angina, A. fib with RVR, and diabetes.  This provider was asked to see the patient today as it was reported  that his pulse rate was 27 as reported by his pulse oximeter.  The patient denied any chest discomfort, weakness, or dizziness.  Medications: I have reviewed the patient's current medications.  Allergies:  Allergies  Allergen Reactions  . Cetuximab Anaphylaxis and Other (See Comments)    Cardiopulmonary arrest    Past Medical History:  Diagnosis Date  . Anemia   . CAD (coronary artery disease)    a. 2008 PCI: Taxus DES  to mRCA, prox & mid LCFX;  b. 10/2014 MV: extensive ischemia in the RCA territory and small area of anterior ischemia;  c. 11/2014 PCI: LM nl, LAD 30ost/prox, 80d, LCX 90ost (3.0x12 Promus Prem DES), OM2 30, RCA 65m (3.0x12 Promus Prem DES), 20m, RPDA 90 (PTCA), EF 55-65%.  . Cancer ([place])    Head & neck ca  . Diverticulosis of colon   . Heart murmur   . History of colon polyps    2014--  hyperplastic, adenoma, and benign polyps  . History of radiation therapy 10/01/15- 11/20/15   Oropharynx/base of neck 70 Gy in 35 fractions  . Hyperlipidemia   . Hypertension   . Myocardial infarction ([place])   . 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 Obesity   . Peripheral neuropathy   . Pneumonia    hx  . Prostate cancer ([place])   . RBBB (right bundle branch block)   . Skin cancer   . Type 2 diabetes mellitus ([place])     Past Surgical History:  Procedure Laterality Date  . APPENDECTOMY    .  CARDIOVASCULAR STRESS TEST  01-17-2012  dr [name]   Normal perfusion study/ no ischemia or infarction/  normal LVF and wall motion, ef 54%  . CORONARY ANGIOPLASTY WITH STENT PLACEMENT  05/04/2006   dr [name]   Severe 2-vessel obstructive CAD/  normal LVF/  PCI with DES to proxima and mid CFX and mRCA (total 3 DES)  . Coronary Stent Intervention  11/13/2014   Performed by [name], [name], MD at [facility]  . CRYO ABLATION PROSTATE N/A 04/01/2014   Performed by [name], MD at [facility]  . DIRECT LARYNGOSCOPY N/A 09/19/2015   Performed by Izora Gala, MD at [facility] N/A 09/19/2015   Performed by Izora Gala, MD at [facility] 09/19/2015   Performed by Izora Gala, MD at [place]  . INSERTION OF SUPRAPUBIC CATHETER N/A 04/01/2014   Performed by [name], MD at [facility]  . IR GASTROSTOMY TUBE REMOVAL  11/17/2016  . IR GENERIC HISTORICAL  11/13/2015   IR GASTROSTOMY TUBE MOD SED 11/13/2015 [name], DO [facility]  . IR GENERIC HISTORICAL  03/26/2016   IR US GUIDE VASC ACCESS RIGHT 03/26/2016 [name], MD [facility]  . IR GENERIC HISTORICAL  03/26/2016   IR FLUORO GUIDE PORT INSERTION RIGHT 03/26/2016 [name], MD [facility]  . Left Heart Cath and Coronary Angiography N/A 11/13/2014   Performed by [name], [name], MD at [facility]  . MULTIPLE TOOTH EXTRACTIONS  09/17/2015  . PARTIAL COLECTOMY  80's    Family History  Problem Relation Age of Onset  . Diverticulitis Mother   . Heart failure Father   . Cancer Father        prostate ca    Social History   Socioeconomic History  . Marital status: Married    Spouse name: Neoma Laming  . Number of children: 2  . Years of education: Not on file  . Highest education level: Not on file  Social Needs  . Financial resource strain: Not on file  . Food insecurity - worry: Not on file  . Food insecurity - inability: Not on file  . Transportation needs - medical: Not on file  . Transportation needs - non-medical: Not on file  Occupational History  . Occupation: Press photographer    Comment: retired  Tobacco Use  . Smoking status: Never Smoker  . Smokeless tobacco: Never Used  Substance and Sexual Activity  . Alcohol use: Yes    Comment: occ beer  . Drug use: No  . Sexual activity: Not on file  Other Topics Concern  . Not on file  Social History Narrative  . Not on file    Past Medical History, Surgical history, Social history, and Family history were reviewed and updated as appropriate.   Please  see review of systems for further details on the patient's review from today.   Review of Systems:  Review of Systems  Constitutional: Negative for diaphoresis and fatigue.  Respiratory: Negative for choking, chest tightness and shortness of breath.   Cardiovascular: Negative for chest pain.    Objective:   Physical Exam:  There were no vitals taken for this visit. ECOG: 0  Physical Exam  Constitutional: No distress.  HENT:  Head: Normocephalic and atraumatic.  Cardiovascular: S1 normal and S2 normal. A regularly irregular rhythm present. Bradycardia present.     Pulmonary/Chest: Effort normal and  breath sounds normal. No respiratory distress. He has no wheezes. He has no rales.  Abdominal: Soft. Bowel sounds are normal. He exhibits no distension. There is no tenderness. There is no rebound and no guarding.  Neurological: He is alert.  Skin: Skin is warm and dry. He is not diaphoretic.    Lab Review:     Component Value Date/Time   NA 141 12/27/2016 0743   K 3.4 (L) 12/27/2016 0743   CL 99 (L) 04/12/2016 1211   CO2 27 12/27/2016 0743   GLUCOSE 125 12/27/2016 0743   BUN 14.5 12/27/2016 0743   CREATININE 0.9 12/27/2016 0743   CALCIUM 9.5 12/27/2016 0743   PROT 6.8 12/27/2016 0743   ALBUMIN 3.6 12/27/2016 0743   AST 15 12/27/2016 0743   ALT 12 12/27/2016 0743   ALKPHOS 93 12/27/2016 0743   BILITOT 0.58 12/27/2016 0743   GFRNONAA >60 04/05/2016 2014   GFRAA >60 04/05/2016 2014       Component Value Date/Time   WBC 5.1 12/27/2016 0742   WBC 13.2 (H) 04/05/2016 2014   RBC 5.15 12/27/2016 0742   RBC 5.34 04/05/2016 2014   HGB 15.0 12/27/2016 0742   HCT 45.5 12/27/2016 0742   PLT 160 12/27/2016 0742   MCV 88.4 12/27/2016 0742   MCH 29.2 12/27/2016 0742   MCH 28.3 04/05/2016 2014   MCHC 33.0 12/27/2016 0742   MCHC 33.9 04/05/2016 2014   RDW 14.7 (H) 12/27/2016 0742   LYMPHSABS 0.5 (L) 12/27/2016 0742   MONOABS 0.4 12/27/2016 0742   EOSABS 0.1 12/27/2016 0742    BASOSABS 0.0 12/27/2016 0742   -------------------------------  Imaging from last 24 hours (if applicable):  Radiology interpretation: Nm Pet Image Restag (ps) Skull Base To Thigh  Result Date: 12/09/2016 CLINICAL DATA:  Subsequent treatment strategy for tongue cancer. EXAM: NUCLEAR MEDICINE PET SKULL BASE TO THIGH TECHNIQUE: 8.75 mCi F-18 FDG was injected intravenously. Full-ring PET imaging was performed from the skull base to thigh after the radiotracer. CT data was obtained and used for attenuation correction and anatomic localization. FASTING BLOOD GLUCOSE:  Value: 57 mg/dl COMPARISON:  09/20/16 FINDINGS: NECK: No hypermetabolic lymph nodes in the neck. CHEST: Normal heart size. Aortic atherosclerosis noted. Calcifications in the RCA, LAD and left circumflex coronary arteries noted. No hypermetabolic mediastinal or hilar lymph nodes. No hypermetabolic supraclavicular or axillary lymph nodes. No hypermetabolic pulmonary nodule or mass. ABDOMEN/PELVIS: The low-attenuation lesion in the caudate lobe of liver measures 2.3 x 3.2 cm and has an SUV max equal to 11.8. On the previous exam this measured 2.2 x 3.0 cm within SUV max equal to 7.3. Small portacaval lymph node measures 1.1 cm and has an SUV max equal to 5.68. On the previous exam this measured 0.5 cm and had an SUV max equal to 2.8. Aortic atherosclerosis. No aneurysm. Right kidney cysts again noted. SKELETON: No focal hypermetabolic activity to suggest skeletal metastasis. IMPRESSION: 1. Slight increase in size and degree of uptake associated with caudate lobe lesion consistent with metastatic disease 2. Increase in size and FDG uptake associated with portacaval lymph node 3. Aortic Atherosclerosis (ICD10-I70.0). Coronary atherosclerotic changes noted. Electronically Signed   By: Kerby Moors M.D.   On: 12/09/2016 14:38        This case was discussed with Dr. Alvy Bimler. She expresses agreement with my management of this patient.

## 2016-12-27 NOTE — Patient Instructions (Signed)
[facility] Discharge Instructions for Patients Receiving Chemotherapy  Today you received the following chemotherapy agents Taxol and Carboplatin.   To help prevent nausea and vomiting after your treatment, we encourage you to take your nausea medication as directed.   If you develop nausea and vomiting that is not controlled by your nausea medication, call the clinic.   BELOW ARE SYMPTOMS THAT SHOULD BE REPORTED IMMEDIATELY:  *FEVER GREATER THAN 100.5 F  *CHILLS WITH OR WITHOUT FEVER  NAUSEA AND VOMITING THAT IS NOT CONTROLLED WITH YOUR NAUSEA MEDICATION  *UNUSUAL SHORTNESS OF BREATH  *UNUSUAL BRUISING OR BLEEDING  TENDERNESS IN MOUTH AND THROAT WITH OR WITHOUT PRESENCE OF ULCERS  *URINARY PROBLEMS  *BOWEL PROBLEMS  UNUSUAL RASH Items with * indicate a potential emergency and should be followed up as soon as possible.  Feel free to call the clinic should you have any questions or concerns. The clinic phone number is [phone].  Please show the CHEMO ALERT CARD at check-in to the Emergency Department and triage nurse.   Paclitaxel injection What is this medicine? PACLITAXEL (PAK li TAX el) is a chemotherapy drug. It targets fast dividing cells, like cancer cells, and causes these cells to die. This medicine is used to treat ovarian cancer, breast cancer, and other cancers. This medicine may be used for other purposes; ask your health care provider or pharmacist if you have questions. COMMON BRAND NAME(S): Onxol, Taxol What should I tell my health care provider before I take this medicine? They need to know if you have any of these conditions: -blood disorders -irregular heartbeat -infection (especially a virus infection such as chickenpox, cold sores, or herpes) -liver disease -previous or ongoing radiation therapy -an unusual or allergic reaction to paclitaxel, alcohol, polyoxyethylated castor oil, other chemotherapy agents, other medicines, foods,  dyes, or preservatives -pregnant or trying to get pregnant -breast-feeding How should I use this medicine? This drug is given as an infusion into a vein. It is administered in a hospital or clinic by a specially trained health care professional. Talk to your pediatrician regarding the use of this medicine in children. Special care may be needed. Overdosage: If you think you have taken too much of this medicine contact a poison control center or emergency room at once. NOTE: This medicine is only for you. Do not share this medicine with others. What if I miss a dose? It is important not to miss your dose. Call your doctor or health care professional if you are unable to keep an appointment. What may interact with this medicine? Do not take this medicine with any of the following medications: -disulfiram -metronidazole This medicine may also interact with the following medications: -cyclosporine -diazepam -ketoconazole -medicines to increase blood counts like filgrastim, pegfilgrastim, sargramostim -other chemotherapy drugs like cisplatin, doxorubicin, epirubicin, etoposide, teniposide, vincristine -quinidine -testosterone -vaccines -verapamil Talk to your doctor or health care professional before taking any of these medicines: -acetaminophen -aspirin -ibuprofen -ketoprofen -naproxen This list may not describe all possible interactions. Give your health care provider a list of all the medicines, herbs, non-prescription drugs, or dietary supplements you use. Also tell them if you smoke, drink alcohol, or use illegal drugs. Some items may interact with your medicine. What should I watch for while using this medicine? Your condition will be monitored carefully while you are receiving this medicine. You will need important blood work done while you are taking this medicine. This medicine can cause serious allergic reactions. To reduce your risk you   will need to take other medicine(s)  before treatment with this medicine. If you experience allergic reactions like skin rash, itching or hives, swelling of the face, lips, or tongue, tell your doctor or health care professional right away. In some cases, you may be given additional medicines to help with side effects. Follow all directions for their use. This drug may make you feel generally unwell. This is not uncommon, as chemotherapy can affect healthy cells as well as cancer cells. Report any side effects. Continue your course of treatment even though you feel ill unless your doctor tells you to stop. Call your doctor or health care professional for advice if you get a fever, chills or sore throat, or other symptoms of a cold or flu. Do not treat yourself. This drug decreases your body's ability to fight infections. Try to avoid being around people who are sick. This medicine may increase your risk to bruise or bleed. Call your doctor or health care professional if you notice any unusual bleeding. Be careful brushing and flossing your teeth or using a toothpick because you may get an infection or bleed more easily. If you have any dental work done, tell your dentist you are receiving this medicine. Avoid taking products that contain aspirin, acetaminophen, ibuprofen, naproxen, or ketoprofen unless instructed by your doctor. These medicines may hide a fever. Do not become pregnant while taking this medicine. Women should inform their doctor if they wish to become pregnant or think they might be pregnant. There is a potential for serious side effects to an unborn child. Talk to your health care professional or pharmacist for more information. Do not breast-feed an infant while taking this medicine. Men are advised not to father a child while receiving this medicine. This product may contain alcohol. Ask your pharmacist or healthcare provider if this medicine contains alcohol. Be sure to tell all healthcare providers you are taking this  medicine. Certain medicines, like metronidazole and disulfiram, can cause an unpleasant reaction when taken with alcohol. The reaction includes flushing, headache, nausea, vomiting, sweating, and increased thirst. The reaction can last from 30 minutes to several hours. What side effects may I notice from receiving this medicine? Side effects that you should report to your doctor or health care professional as soon as possible: -allergic reactions like skin rash, itching or hives, swelling of the face, lips, or tongue -low blood counts - This drug may decrease the number of white blood cells, red blood cells and platelets. You may be at increased risk for infections and bleeding. -signs of infection - fever or chills, cough, sore throat, pain or difficulty passing urine -signs of decreased platelets or bleeding - bruising, pinpoint red spots on the skin, black, tarry stools, nosebleeds -signs of decreased red blood cells - unusually weak or tired, fainting spells, lightheadedness -breathing problems -chest pain -high or low blood pressure -mouth sores -nausea and vomiting -pain, swelling, redness or irritation at the injection site -pain, tingling, numbness in the hands or feet -slow or irregular heartbeat -swelling of the ankle, feet, hands Side effects that usually do not require medical attention (report to your doctor or health care professional if they continue or are bothersome): -bone pain -complete hair loss including hair on your head, underarms, pubic hair, eyebrows, and eyelashes -changes in the color of fingernails -diarrhea -loosening of the fingernails -loss of appetite -muscle or joint pain -red flush to skin -sweating This list may not describe all possible side effects. Call your doctor for   medical advice about side effects. You may report side effects to FDA at 1-800-FDA-1088. Where should I keep my medicine? This drug is given in a hospital or clinic and will not be  stored at home. NOTE: This sheet is a summary. It may not cover all possible information. If you have questions about this medicine, talk to your doctor, pharmacist, or health care provider.  2018 Elsevier/Gold Standard (2014-11-26 19:58:00)   Carboplatin injection What is this medicine? CARBOPLATIN (KAR boe pla tin) is a chemotherapy drug. It targets fast dividing cells, like cancer cells, and causes these cells to die. This medicine is used to treat ovarian cancer and many other cancers. This medicine may be used for other purposes; ask your health care provider or pharmacist if you have questions. COMMON BRAND NAME(S): Paraplatin What should I tell my health care provider before I take this medicine? They need to know if you have any of these conditions: -blood disorders -hearing problems -kidney disease -recent or ongoing radiation therapy -an unusual or allergic reaction to carboplatin, cisplatin, other chemotherapy, other medicines, foods, dyes, or preservatives -pregnant or trying to get pregnant -breast-feeding How should I use this medicine? This drug is usually given as an infusion into a vein. It is administered in a hospital or clinic by a specially trained health care professional. Talk to your pediatrician regarding the use of this medicine in children. Special care may be needed. Overdosage: If you think you have taken too much of this medicine contact a poison control center or emergency room at once. NOTE: This medicine is only for you. Do not share this medicine with others. What if I miss a dose? It is important not to miss a dose. Call your doctor or health care professional if you are unable to keep an appointment. What may interact with this medicine? -medicines for seizures -medicines to increase blood counts like filgrastim, pegfilgrastim, sargramostim -some antibiotics like amikacin, gentamicin, neomycin, streptomycin, tobramycin -vaccines Talk to your doctor  or health care professional before taking any of these medicines: -acetaminophen -aspirin -ibuprofen -ketoprofen -naproxen This list may not describe all possible interactions. Give your health care provider a list of all the medicines, herbs, non-prescription drugs, or dietary supplements you use. Also tell them if you smoke, drink alcohol, or use illegal drugs. Some items may interact with your medicine. What should I watch for while using this medicine? Your condition will be monitored carefully while you are receiving this medicine. You will need important blood work done while you are taking this medicine. This drug may make you feel generally unwell. This is not uncommon, as chemotherapy can affect healthy cells as well as cancer cells. Report any side effects. Continue your course of treatment even though you feel ill unless your doctor tells you to stop. In some cases, you may be given additional medicines to help with side effects. Follow all directions for their use. Call your doctor or health care professional for advice if you get a fever, chills or sore throat, or other symptoms of a cold or flu. Do not treat yourself. This drug decreases your body's ability to fight infections. Try to avoid being around people who are sick. This medicine may increase your risk to bruise or bleed. Call your doctor or health care professional if you notice any unusual bleeding. Be careful brushing and flossing your teeth or using a toothpick because you may get an infection or bleed more easily. If you have any dental work done,   tell your dentist you are receiving this medicine. Avoid taking products that contain aspirin, acetaminophen, ibuprofen, naproxen, or ketoprofen unless instructed by your doctor. These medicines may hide a fever. Do not become pregnant while taking this medicine. Women should inform their doctor if they wish to become pregnant or think they might be pregnant. There is a potential  for serious side effects to an unborn child. Talk to your health care professional or pharmacist for more information. Do not breast-feed an infant while taking this medicine. What side effects may I notice from receiving this medicine? Side effects that you should report to your doctor or health care professional as soon as possible: -allergic reactions like skin rash, itching or hives, swelling of the face, lips, or tongue -signs of infection - fever or chills, cough, sore throat, pain or difficulty passing urine -signs of decreased platelets or bleeding - bruising, pinpoint red spots on the skin, black, tarry stools, nosebleeds -signs of decreased red blood cells - unusually weak or tired, fainting spells, lightheadedness -breathing problems -changes in hearing -changes in vision -chest pain -high blood pressure -low blood counts - This drug may decrease the number of white blood cells, red blood cells and platelets. You may be at increased risk for infections and bleeding. -nausea and vomiting -pain, swelling, redness or irritation at the injection site -pain, tingling, numbness in the hands or feet -problems with balance, talking, walking -trouble passing urine or change in the amount of urine Side effects that usually do not require medical attention (report to your doctor or health care professional if they continue or are bothersome): -hair loss -loss of appetite -metallic taste in the mouth or changes in taste This list may not describe all possible side effects. Call your doctor for medical advice about side effects. You may report side effects to FDA at 1-800-FDA-1088. Where should I keep my medicine? This drug is given in a hospital or clinic and will not be stored at home. NOTE: This sheet is a summary. It may not cover all possible information. If you have questions about this medicine, talk to your doctor, pharmacist, or health care provider.  2018 Elsevier/Gold Standard  (2007-05-02 14:38:05)  

## 2016-12-28 ENCOUNTER — Telehealth: Payer: Self-pay | Admitting: *Deleted

## 2016-12-28 NOTE — Telephone Encounter (Signed)
Called for chemo follow up. States had terrific appetite yesterday. Worked in yard and at [employer] today. Starting to feel a little woozy- will get some rest this afternoon. Denies N/V

## 2016-12-28 NOTE — Telephone Encounter (Signed)
-----   Message from Coker, RN sent at 12/27/2016  4:15 PM EST ----- Regarding: Dr. Alvy Bimler chemo follow up Pt of Dr. Alvy Bimler first time Taxol infusion. Heart rate dropped to 30's during infusion. Infusion restarted after evaluated by symptom management, completed taxol without any other issues. Please follow up chemo.

## 2017-01-03 ENCOUNTER — Ambulatory Visit: Payer: Medicare Other

## 2017-01-03 ENCOUNTER — Telehealth: Payer: Self-pay | Admitting: Hematology and Oncology

## 2017-01-03 ENCOUNTER — Ambulatory Visit (HOSPITAL_BASED_OUTPATIENT_CLINIC_OR_DEPARTMENT_OTHER): Payer: Medicare Other | Admitting: Hematology and Oncology

## 2017-01-03 ENCOUNTER — Ambulatory Visit (HOSPITAL_BASED_OUTPATIENT_CLINIC_OR_DEPARTMENT_OTHER): Payer: Medicare Other

## 2017-01-03 ENCOUNTER — Other Ambulatory Visit (HOSPITAL_BASED_OUTPATIENT_CLINIC_OR_DEPARTMENT_OTHER): Payer: Medicare Other

## 2017-01-03 DIAGNOSIS — C770 Secondary and unspecified malignant neoplasm of lymph nodes of head, face and neck: Secondary | ICD-10-CM

## 2017-01-03 DIAGNOSIS — C01 Malignant neoplasm of base of tongue: Secondary | ICD-10-CM | POA: Diagnosis present

## 2017-01-03 DIAGNOSIS — Z5111 Encounter for antineoplastic chemotherapy: Secondary | ICD-10-CM

## 2017-01-03 DIAGNOSIS — C029 Malignant neoplasm of tongue, unspecified: Secondary | ICD-10-CM

## 2017-01-03 DIAGNOSIS — C787 Secondary malignant neoplasm of liver and intrahepatic bile duct: Secondary | ICD-10-CM

## 2017-01-03 DIAGNOSIS — R634 Abnormal weight loss: Secondary | ICD-10-CM

## 2017-01-03 DIAGNOSIS — F329 Major depressive disorder, single episode, unspecified: Secondary | ICD-10-CM | POA: Diagnosis not present

## 2017-01-03 DIAGNOSIS — F32A Depression, unspecified: Secondary | ICD-10-CM

## 2017-01-03 DIAGNOSIS — R110 Nausea: Secondary | ICD-10-CM

## 2017-01-03 DIAGNOSIS — D696 Thrombocytopenia, unspecified: Secondary | ICD-10-CM

## 2017-01-03 LAB — CBC WITH DIFFERENTIAL/PLATELET
BASO%: 0.5 % (ref 0.0–2.0)
Basophils Absolute: 0 10*3/uL (ref 0.0–0.1)
EOS%: 1.4 % (ref 0.0–7.0)
Eosinophils Absolute: 0.1 10*3/uL (ref 0.0–0.5)
HEMATOCRIT: 43.2 % (ref 38.4–49.9)
HEMOGLOBIN: 14.1 g/dL (ref 13.0–17.1)
LYMPH#: 0.5 10*3/uL — AB (ref 0.9–3.3)
LYMPH%: 12 % — ABNORMAL LOW (ref 14.0–49.0)
MCH: 29.2 pg (ref 27.2–33.4)
MCHC: 32.6 g/dL (ref 32.0–36.0)
MCV: 89.4 fL (ref 79.3–98.0)
MONO#: 0.2 10*3/uL (ref 0.1–0.9)
MONO%: 3.5 % (ref 0.0–14.0)
NEUT%: 82.6 % — ABNORMAL HIGH (ref 39.0–75.0)
NEUTROS ABS: 3.5 10*3/uL (ref 1.5–6.5)
PLATELETS: 129 10*3/uL — AB (ref 140–400)
RBC: 4.83 10*6/uL (ref 4.20–5.82)
RDW: 13.3 % (ref 11.0–14.6)
WBC: 4.3 10*3/uL (ref 4.0–10.3)

## 2017-01-03 LAB — COMPREHENSIVE METABOLIC PANEL
ALT: 12 U/L (ref 0–55)
ANION GAP: 8 meq/L (ref 3–11)
AST: 14 U/L (ref 5–34)
Albumin: 3.4 g/dL — ABNORMAL LOW (ref 3.5–5.0)
Alkaline Phosphatase: 89 U/L (ref 40–150)
BILIRUBIN TOTAL: 0.8 mg/dL (ref 0.20–1.20)
BUN: 15.3 mg/dL (ref 7.0–26.0)
CALCIUM: 9.4 mg/dL (ref 8.4–10.4)
CO2: 25 mEq/L (ref 22–29)
CREATININE: 0.8 mg/dL (ref 0.7–1.3)
Chloride: 106 mEq/L (ref 98–109)
EGFR: >60 mL/min/{1.73_m2} (ref >60)
Glucose: 98 mg/dl (ref 70–140)
Potassium: 3.8 mEq/L (ref 3.5–5.1)
Sodium: 140 mEq/L (ref 136–145)
TOTAL PROTEIN: 6.5 g/dL (ref 6.4–8.3)

## 2017-01-03 MED ORDER — SODIUM CHLORIDE 0.9 % IV SOLN
45.0000 mg/m2 | Freq: Once | INTRAVENOUS | Status: AC
  Administered 2017-01-03: 90 mg via INTRAVENOUS
  Filled 2017-01-03: qty 15

## 2017-01-03 MED ORDER — SODIUM CHLORIDE 0.9% FLUSH
10.0000 mL | Freq: Once | INTRAVENOUS | Status: AC
  Administered 2017-01-03: 10 mL
  Filled 2017-01-03: qty 10

## 2017-01-03 MED ORDER — FAMOTIDINE IN NACL 20-0.9 MG/50ML-% IV SOLN
20.0000 mg | Freq: Once | INTRAVENOUS | Status: AC
  Administered 2017-01-03: 20 mg via INTRAVENOUS

## 2017-01-03 MED ORDER — SODIUM CHLORIDE 0.9 % IV SOLN
20.0000 mg | Freq: Once | INTRAVENOUS | Status: AC
  Administered 2017-01-03: 20 mg via INTRAVENOUS
  Filled 2017-01-03: qty 2

## 2017-01-03 MED ORDER — SODIUM CHLORIDE 0.9 % IV SOLN
198.8000 mg | Freq: Once | INTRAVENOUS | Status: AC
  Administered 2017-01-03: 200 mg via INTRAVENOUS
  Filled 2017-01-03: qty 20

## 2017-01-03 MED ORDER — FAMOTIDINE IN NACL 20-0.9 MG/50ML-% IV SOLN
INTRAVENOUS | Status: AC
  Filled 2017-01-03: qty 50

## 2017-01-03 MED ORDER — SODIUM CHLORIDE 0.9 % IV SOLN
Freq: Once | INTRAVENOUS | Status: AC
  Administered 2017-01-03: 12:00:00 via INTRAVENOUS

## 2017-01-03 MED ORDER — SODIUM CHLORIDE 0.9% FLUSH
10.0000 mL | INTRAVENOUS | Status: DC | PRN
  Administered 2017-01-03: 10 mL
  Filled 2017-01-03: qty 10

## 2017-01-03 MED ORDER — DIPHENHYDRAMINE HCL 50 MG/ML IJ SOLN
INTRAMUSCULAR | Status: AC
  Filled 2017-01-03: qty 1

## 2017-01-03 MED ORDER — MIRTAZAPINE 30 MG PO TABS: 30.0000 mg | ORAL_TABLET | Freq: Every day | ORAL | 1 refills | Status: DC

## 2017-01-03 MED ORDER — DIPHENHYDRAMINE HCL 50 MG/ML IJ SOLN
50.0000 mg | Freq: Once | INTRAMUSCULAR | Status: AC
  Administered 2017-01-03: 50 mg via INTRAVENOUS

## 2017-01-03 MED ORDER — HEPARIN SOD (PORK) LOCK FLUSH 100 UNIT/ML IV SOLN
500.0000 [IU] | Freq: Once | INTRAVENOUS | Status: AC | PRN
  Administered 2017-01-03: 500 [IU]
  Filled 2017-01-03: qty 5

## 2017-01-03 MED ORDER — PALONOSETRON HCL INJECTION 0.25 MG/5ML
INTRAVENOUS | Status: AC
  Filled 2017-01-03: qty 5

## 2017-01-03 MED ORDER — PALONOSETRON HCL INJECTION 0.25 MG/5ML
0.2500 mg | Freq: Once | INTRAVENOUS | Status: AC
  Administered 2017-01-03: 0.25 mg via INTRAVENOUS

## 2017-01-03 NOTE — Telephone Encounter (Signed)
Scheduled appt per 11/26 los - Gave patient AVS and calender per los 

## 2017-01-03 NOTE — Patient Instructions (Signed)
[facility] Discharge Instructions for Patients Receiving Chemotherapy  Today you received the following chemotherapy agents :  Taxol, Carboplatin.  To help prevent nausea and vomiting after your treatment, we encourage you to take your nausea medication as prescribed.  TAKE  ZOFRAN ON THE THIRD DAY FOR NAUSEA AS NEEDED.    If you develop nausea and vomiting that is not controlled by your nausea medication, call the clinic.   BELOW ARE SYMPTOMS THAT SHOULD BE REPORTED IMMEDIATELY:  *FEVER GREATER THAN 100.5 F  *CHILLS WITH OR WITHOUT FEVER  NAUSEA AND VOMITING THAT IS NOT CONTROLLED WITH YOUR NAUSEA MEDICATION  *UNUSUAL SHORTNESS OF BREATH  *UNUSUAL BRUISING OR BLEEDING  TENDERNESS IN MOUTH AND THROAT WITH OR WITHOUT PRESENCE OF ULCERS  *URINARY PROBLEMS  *BOWEL PROBLEMS  UNUSUAL RASH Items with * indicate a potential emergency and should be followed up as soon as possible.  Feel free to call the clinic should you have any questions or concerns. The clinic phone number is [phone].  Please show the [place] at check-in to the Emergency Department and triage nurse.

## 2017-01-03 NOTE — Patient Instructions (Signed)
Implanted Port Home Guide An implanted port is a type of central line that is placed under the skin. Central lines are used to provide IV access when treatment or nutrition needs to be given through a person's veins. Implanted ports are used for long-term IV access. An implanted port may be placed because:  You need IV medicine that would be irritating to the small veins in your hands or arms.  You need long-term IV medicines, such as antibiotics.  You need IV nutrition for a long period.  You need frequent blood draws for lab tests.  You need dialysis.  Implanted ports are usually placed in the chest area, but they can also be placed in the upper arm, the abdomen, or the leg. An implanted port has two main parts:  Reservoir. The reservoir is round and will appear as a small, raised area under your skin. The reservoir is the part where a needle is inserted to give medicines or draw blood.  Catheter. The catheter is a thin, flexible tube that extends from the reservoir. The catheter is placed into a large vein. Medicine that is inserted into the reservoir goes into the catheter and then into the vein.  How will I care for my incision site? Do not get the incision site wet. Bathe or shower as directed by your health care provider. How is my port accessed? Special steps must be taken to access the port:  Before the port is accessed, a numbing cream can be placed on the skin. This helps numb the skin over the port site.  Your health care provider uses a sterile technique to access the port. ? Your health care provider must put on a mask and sterile gloves. ? The skin over your port is cleaned carefully with an antiseptic and allowed to dry. ? The port is gently pinched between sterile gloves, and a needle is inserted into the port.  Only "non-coring" port needles should be used to access the port. Once the port is accessed, a blood return should be checked. This helps ensure that the port  is in the vein and is not clogged.  If your port needs to remain accessed for a constant infusion, a clear (transparent) bandage will be placed over the needle site. The bandage and needle will need to be changed every week, or as directed by your health care provider.  Keep the bandage covering the needle clean and dry. Do not get it wet. Follow your health care provider's instructions on how to take a shower or bath while the port is accessed.  If your port does not need to stay accessed, no bandage is needed over the port.  What is flushing? Flushing helps keep the port from getting clogged. Follow your health care provider's instructions on how and when to flush the port. Ports are usually flushed with saline solution or a medicine called heparin. The need for flushing will depend on how the port is used.  If the port is used for intermittent medicines or blood draws, the port will need to be flushed: ? After medicines have been given. ? After blood has been drawn. ? As part of routine maintenance.  If a constant infusion is running, the port may not need to be flushed.  How long will my port stay implanted? The port can stay in for as long as your health care provider thinks it is needed. When it is time for the port to come out, surgery will be   done to remove it. The procedure is similar to the one performed when the port was put in. When should I seek immediate medical care? When you have an implanted port, you should seek immediate medical care if:  You notice a bad smell coming from the incision site.  You have swelling, redness, or drainage at the incision site.  You have more swelling or pain at the port site or the surrounding area.  You have a fever that is not controlled with medicine.  This information is not intended to replace advice given to you by your health care provider. Make sure you discuss any questions you have with your health care provider. Document  Released: 01/25/2005 Document Revised: 07/03/2015 Document Reviewed: 10/02/2012 Elsevier Interactive Patient Education  2017 [employer]  

## 2017-01-04 ENCOUNTER — Encounter: Payer: Self-pay | Admitting: Hematology and Oncology

## 2017-01-04 DIAGNOSIS — D696 Thrombocytopenia, unspecified: Secondary | ICD-10-CM | POA: Insufficient documentation

## 2017-01-04 NOTE — Assessment & Plan Note (Signed)
The patient attributed recent weight loss to denture issue I recommend increasing the dose of Remeron I reminded consistently

## 2017-01-04 NOTE — Assessment & Plan Note (Addendum)
This is likely due to recent treatment. The patient denies recent history of bleeding such as epistaxis, hematuria or hematochezia. He is asymptomatic from the low platelet count. I will observe for now.  he does not require transfusion now. I will continue the chemotherapy at current dose without dosage adjustment.  If the thrombocytopenia gets progressive worse in the future, I might have to delay his treatment or adjust the chemotherapy dose. There is no contraindication to remain on antiplatelet agents or anticoagulants as long as the platelet is greater than 50,000.

## 2017-01-04 NOTE — Assessment & Plan Note (Signed)
The patient is pleasantly surprised that so far, he tolerated chemotherapy very well without any side effects He has lost some weight but he attributed that to his dentures I would proceed with current dose of chemotherapy without dose adjustment I recommend minimum 2-3 months of treatment before repeat imaging study and he agreed with the plan of care

## 2017-01-04 NOTE — Progress Notes (Signed)
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 Biopsy recommended. No definite pharyngeal mass lesion identified by CT.       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 No other primary or metastatic cancer. 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         11/13/2015 Procedure    Placement of 20 French pull-through percutaneous gastrostomy tube.      03/11/2016 PET scan    Resolution of hypermetabolic right cervical lymphadenopathy since prior study . No other hypermetabolic lymphadenopathy identified. New hypermetabolic lesion and caudate lobe of liver, highly suspicious for liver metastasis. Recommend liver protocol abdomen MRI without and with contrast for further evaluation. New patchy airspace disease in  both lung apices with FDG uptake. 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 Recommend continued attention on follow-up imaging.       03/19/2016 Imaging    MRI showed 3.4 cm heterogeneously enhancing mass in caudate lobe of liver, consistent liver metastasis. No other sites of metastatic disease identified within the liver or abdomen      03/26/2016 Procedure    Ultrasound and fluoroscopically guided right internal jugular single lumen power port catheter insertion. Tip in the SVC/RA junction. 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 Metabolic response to therapy of caudate lobe liver metastasis. Low-level activity in this area is similar to the surrounding liver.  2. Hypermetabolism in the right inguinal crease without concurrent adenopathy. Especially given subtle surrounding edema, favor cellulitis. Consider physical exam correlation. 3. Lack of visualization of previously described 7 mm left lower lobe pulmonary nodule. 4. Diffuse marrow hypermetabolism, favoring stimulation by chemotherapy.      09/20/2016 PET scan  1. Reduced activity in the hypermetabolic caudate lobe lesion of the liver, likely a metastatic lesion. 2. Scattered likely inflammatory activity along the glenohumeral and acromioclavicular joints and in the hips. 3. Healing of bilateral upper rib fractures. 4. Other imaging findings of potential clinical significance: Aortic Atherosclerosis (ICD10-I70.0). Coronary and  carotid atherosclerosis. Photopenic right renal cysts.      11/17/2016 Procedure    Successful removal of 20 French pull-through gastrostomy tube.      12/09/2016 PET scan    1. Slight increase in size and degree of uptake associated with caudate lobe lesion consistent with metastatic disease 2. Increase in size and FDG uptake associated with portacaval lymph node 3. Aortic Atherosclerosis (ICD10-I70.0). Coronary atherosclerotic changes noted.      12/27/2016 -  Chemotherapy    He received carboplatin and taxol       Cancer of base of tongue ([place])    INTERVAL HISTORY: Please see below for problem oriented charting. The patient is seen prior to cycle 2 of chemotherapy He tolerated cycle 1 very well He denies worsening peripheral neuropathy, recent infection, nausea or vomiting His energy level is fair He has lost some weight but he thought it could be due to ill fitting dentures Once he stopped using it, he feels better His wife is concerned of recent weight loss  REVIEW OF SYSTEMS:   Constitutional: Denies fevers, chills  Eyes: Denies blurriness of vision Ears, nose, mouth, throat, and face: Denies mucositis or sore throat Respiratory: Denies cough, dyspnea or wheezes Cardiovascular: Denies palpitation, chest discomfort or lower extremity swelling Gastrointestinal:  Denies nausea, heartburn or change in bowel habits Skin: Denies abnormal skin rashes Lymphatics: Denies new lymphadenopathy or easy bruising Neurological:Denies numbness, tingling or new weaknesses Behavioral/Psych: Mood is stable, no new changes  All other systems were reviewed with the patient and are negative.  I have reviewed the past medical history, past surgical history, social history and family history with the patient and they are unchanged from previous note.  ALLERGIES:  is allergic to cetuximab.  MEDICATIONS:  Current Outpatient Medications  Medication Sig Dispense Refill  . aspirin EC 81 MG  tablet Take 81 mg by mouth at bedtime.     . clopidogrel (PLAVIX) 75 MG tablet TAKE 1 TABLET EVERY DAY 90 tablet 0  . insulin glargine (LANTUS) 100 UNIT/ML injection Inject 0.5 mLs (50 Units total) into the skin at bedtime. 10 mL   . mirtazapine (REMERON) 30 MG tablet Take 1 tablet (30 mg total) by mouth at bedtime. 30 tablet 1   No current facility-administered medications for this visit.     PHYSICAL EXAMINATION: ECOG PERFORMANCE STATUS: 1 - Symptomatic but completely ambulatory  Vitals:   01/03/17 1013  BP: 114/73  Pulse: 94  Resp: 18  Temp: 98.9 F (37.2 C)  SpO2: 98%   Filed Weights   01/03/17 1013  Weight: 171 lb 6.4 oz (77.7 kg)    GENERAL:alert, no distress and comfortable SKIN: skin color, texture, turgor are normal, no rashes or significant lesions EYES: normal, Conjunctiva are pink and non-injected, sclera clear OROPHARYNX:no exudate, no erythema and lips, buccal mucosa, and tongue normal  NECK: supple, thyroid normal size, non-tender, without nodularity LYMPH:  no palpable lymphadenopathy in the cervical, axillary or inguinal LUNGS: clear to auscultation and percussion with normal breathing effort HEART: regular rate & rhythm and no murmurs and no lower extremity edema ABDOMEN:abdomen soft, non-tender and normal bowel sounds Musculoskeletal:no cyanosis of digits and no clubbing  NEURO:  alert & oriented x 3 with fluent speech, no focal motor/sensory deficits  LABORATORY DATA:  I have reviewed the data as listed    Component Value Date/Time   NA 140 01/03/2017 0947   K 3.8 01/03/2017 0947   CL 99 (L) 04/12/2016 1211   CO2 25 01/03/2017 0947   GLUCOSE 98 01/03/2017 0947   BUN 15.3 01/03/2017 0947   CREATININE 0.8 01/03/2017 0947   CALCIUM 9.4 01/03/2017 0947   PROT 6.5 01/03/2017 0947   ALBUMIN 3.4 (L) 01/03/2017 0947   AST 14 01/03/2017 0947   ALT 12 01/03/2017 0947   ALKPHOS 89 01/03/2017 0947   BILITOT 0.80 01/03/2017 0947   GFRNONAA >60 04/05/2016  2014   GFRAA >60 04/05/2016 2014    No results found for: SPEP, UPEP  Lab Results  Component Value Date   WBC 4.3 01/03/2017   NEUTROABS 3.5 01/03/2017   HGB 14.1 01/03/2017   HCT 43.2 01/03/2017   MCV 89.4 01/03/2017   PLT 129 (L) 01/03/2017      Chemistry      Component Value Date/Time   NA 140 01/03/2017 0947   K 3.8 01/03/2017 0947   CL 99 (L) 04/12/2016 1211   CO2 25 01/03/2017 0947   BUN 15.3 01/03/2017 0947   CREATININE 0.8 01/03/2017 0947   GLU 78 11/13/2015 1134      Component Value Date/Time   CALCIUM 9.4 01/03/2017 0947   ALKPHOS 89 01/03/2017 0947   AST 14 01/03/2017 0947   ALT 12 01/03/2017 0947   BILITOT 0.80 01/03/2017 0947       RADIOGRAPHIC STUDIES: I have personally reviewed the radiological images as listed and agreed with the findings in the report. Nm Pet Image Restag (ps) Skull Base To Thigh  Result Date: 12/09/2016 CLINICAL DATA:  Subsequent treatment strategy for tongue cancer. EXAM: NUCLEAR MEDICINE PET SKULL BASE TO THIGH TECHNIQUE: 8.75 mCi F-18 FDG was injected intravenously. Full-ring PET imaging was performed from the skull base to thigh after the radiotracer. CT data was obtained and used for attenuation correction and anatomic localization. FASTING BLOOD GLUCOSE:  Value: 57 mg/dl COMPARISON:  09/20/16 FINDINGS: NECK: No hypermetabolic lymph nodes in the neck. CHEST: Normal heart size. Aortic atherosclerosis noted. Calcifications in the RCA, LAD and left circumflex coronary arteries noted. No hypermetabolic mediastinal or hilar lymph nodes. No hypermetabolic supraclavicular or axillary lymph nodes. No hypermetabolic pulmonary nodule or mass. ABDOMEN/PELVIS: The low-attenuation lesion in the caudate lobe of liver measures 2.3 x 3.2 cm and has an SUV max equal to 11.8. On the previous exam this measured 2.2 x 3.0 cm within SUV max equal to 7.3. Small portacaval lymph node measures 1.1 cm and has an SUV max equal to 5.68. On the previous exam this  measured 0.5 cm and had an SUV max equal to 2.8. Aortic atherosclerosis. No aneurysm. Right kidney cysts again noted. SKELETON: No focal hypermetabolic activity to suggest skeletal metastasis. IMPRESSION: 1. Slight increase in size and degree of uptake associated with caudate lobe lesion consistent with metastatic disease 2. Increase in size and FDG uptake associated with portacaval lymph node 3. Aortic Atherosclerosis (ICD10-I70.0). Coronary atherosclerotic changes noted. Electronically Signed   By: Kerby Moors M.D.   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 The patient denies recent history of bleeding such as epistaxis, hematuria or hematochezia. He is asymptomatic from the low platelet count. I will observe for now.  he does not require transfusion now. I will continue the chemotherapy at current dose without dosage adjustment.  If the thrombocytopenia gets progressive worse in the future, I might have to delay his treatment or adjust the chemotherapy dose. There is no contraindication to remain on antiplatelet agents or anticoagulants as long as the platelet is greater than 50,000.  Weight loss The patient attributed recent weight loss to denture issue I recommend increasing the dose of Remeron I reminded consistently   No orders of the defined types were placed in this encounter.  All questions were answered. The patient knows to call the clinic with any problems,  questions or concerns. No barriers to learning was detected. I spent 15 minutes counseling the patient face to face. The total time spent in the appointment was 20 minutes and more than 50% was on counseling and review of test results     Heath Lark, MD 01/04/2017 5:03 PM

## 2017-01-04 NOTE — Assessment & Plan Note (Signed)
He is not symptomatic and his liver function is preserved

## 2017-01-11 ENCOUNTER — Ambulatory Visit (HOSPITAL_BASED_OUTPATIENT_CLINIC_OR_DEPARTMENT_OTHER): Payer: Medicare Other

## 2017-01-11 ENCOUNTER — Other Ambulatory Visit: Payer: Self-pay

## 2017-01-11 ENCOUNTER — Other Ambulatory Visit (HOSPITAL_BASED_OUTPATIENT_CLINIC_OR_DEPARTMENT_OTHER): Payer: Medicare Other

## 2017-01-11 ENCOUNTER — Ambulatory Visit: Payer: Medicare Other

## 2017-01-11 VITALS — BP 123/66 | HR 94 | Temp 98.3°F | Resp 18

## 2017-01-11 DIAGNOSIS — C01 Malignant neoplasm of base of tongue: Secondary | ICD-10-CM | POA: Diagnosis present

## 2017-01-11 DIAGNOSIS — C787 Secondary malignant neoplasm of liver and intrahepatic bile duct: Secondary | ICD-10-CM

## 2017-01-11 DIAGNOSIS — C770 Secondary and unspecified malignant neoplasm of lymph nodes of head, face and neck: Secondary | ICD-10-CM

## 2017-01-11 DIAGNOSIS — R110 Nausea: Secondary | ICD-10-CM

## 2017-01-11 DIAGNOSIS — C029 Malignant neoplasm of tongue, unspecified: Secondary | ICD-10-CM

## 2017-01-11 LAB — COMPREHENSIVE METABOLIC PANEL
ALT: 15 U/L (ref 0–55)
AST: 17 U/L (ref 5–34)
Albumin: 3.4 g/dL — ABNORMAL LOW (ref 3.5–5.0)
Alkaline Phosphatase: 82 U/L (ref 40–150)
Anion Gap: 8 mEq/L (ref 3–11)
BUN: 11.2 mg/dL (ref 7.0–26.0)
CALCIUM: 9.1 mg/dL (ref 8.4–10.4)
CHLORIDE: 106 meq/L (ref 98–109)
CO2: 25 mEq/L (ref 22–29)
Creatinine: 0.9 mg/dL (ref 0.7–1.3)
EGFR: >60 mL/min/{1.73_m2} (ref >60)
Glucose: 148 mg/dl — ABNORMAL HIGH (ref 70–140)
POTASSIUM: 3.6 meq/L (ref 3.5–5.1)
Sodium: 140 mEq/L (ref 136–145)
Total Bilirubin: 0.57 mg/dL (ref 0.20–1.20)
Total Protein: 6.3 g/dL — ABNORMAL LOW (ref 6.4–8.3)

## 2017-01-11 LAB — CBC WITH DIFFERENTIAL/PLATELET
BASO%: 1.4 % (ref 0.0–2.0)
BASOS ABS: 0 10*3/uL (ref 0.0–0.1)
EOS%: 0.9 % (ref 0.0–7.0)
Eosinophils Absolute: 0 10*3/uL (ref 0.0–0.5)
HEMATOCRIT: 37.2 % — AB (ref 38.4–49.9)
HGB: 12.5 g/dL — ABNORMAL LOW (ref 13.0–17.1)
LYMPH#: 0.3 10*3/uL — AB (ref 0.9–3.3)
LYMPH%: 22.7 % (ref 14.0–49.0)
MCH: 29.6 pg (ref 27.2–33.4)
MCHC: 33.7 g/dL (ref 32.0–36.0)
MCV: 88 fL (ref 79.3–98.0)
MONO#: 0.1 10*3/uL (ref 0.1–0.9)
MONO%: 10.2 % (ref 0.0–14.0)
NEUT#: 0.8 10*3/uL — ABNORMAL LOW (ref 1.5–6.5)
NEUT%: 64.8 % (ref 39.0–75.0)
Platelets: 169 10*3/uL (ref 140–400)
RBC: 4.23 10*6/uL (ref 4.20–5.82)
RDW: 13.5 % (ref 11.0–14.6)
WBC: 1.3 10*3/uL — ABNORMAL LOW (ref 4.0–10.3)

## 2017-01-11 MED ORDER — HEPARIN SOD (PORK) LOCK FLUSH 100 UNIT/ML IV SOLN
500.0000 [IU] | Freq: Once | INTRAVENOUS | Status: AC
  Administered 2017-01-11: 500 [IU]
  Filled 2017-01-11: qty 5

## 2017-01-11 MED ORDER — PROCHLORPERAZINE MALEATE 10 MG PO TABS: 10.0000 mg | ORAL_TABLET | Freq: Four times a day (QID) | ORAL | 11 refills | Status: DC | PRN

## 2017-01-11 MED ORDER — SODIUM CHLORIDE 0.9% FLUSH
10.0000 mL | Freq: Once | INTRAVENOUS | Status: AC
  Administered 2017-01-11: 10 mL
  Filled 2017-01-11: qty 10

## 2017-01-11 MED ORDER — ONDANSETRON HCL 8 MG PO TABS: 8.0000 mg | ORAL_TABLET | Freq: Three times a day (TID) | ORAL | 11 refills | Status: AC | PRN

## 2017-01-11 NOTE — Progress Notes (Signed)
Per Dr. Rae Roam treatment today due to neutrophil count being 0.8

## 2017-01-11 NOTE — Patient Instructions (Signed)
Neutropenia Neutropenia is a condition that occurs when you have a lower-than-normal level of a type of white blood cell (neutrophil) in your body. Neutrophils are made in the spongy center of large bones (bone marrow) and they fight infections. Neutrophils are your body's main defense against bacterial and fungal infections. The fewer neutrophils you have and the longer your body remains without them, the greater your risk of getting a severe infection. What are the causes? This condition can occur if your body uses up or destroys neutrophils faster than your bone marrow can make them. This problem may happen because of:  Bacterial or fungal infection.  Allergic disorders.  Reactions to some medicines.  Autoimmune disease.  An enlarged spleen.  This condition can also occur if your bone marrow does not produce enough neutrophils. This problem may be caused by:  Cancer.  Cancer treatments, such as radiation or chemotherapy.  Viral infections.  Medicines, such as phenytoin.  Vitamin B12 deficiency.  Diseases of the bone marrow.  Environmental toxins, such as insecticides.  What are the signs or symptoms? This condition does not usually cause symptoms. If symptoms are present, they are usually caused by an underlying infection. Symptoms of an infection may include:  Fever.  Chills.  Swollen glands.  Oral or anal ulcers.  Cough and shortness of breath.  Rash.  Skin infection.  Fatigue.  How is this diagnosed? Your health care provider may suspect neutropenia if you have:  A condition that may cause neutropenia.  Symptoms of infection, especially fever.  Frequent and unusual infections.  You will have a medical history and physical exam. Tests will also be done, such as:  A complete blood count (CBC).  A procedure to collect a sample of bone marrow for examination (bone marrow biopsy).  A chest X-ray.  A urine culture.  A blood culture.  How is this  treated? Treatment depends on the underlying cause and severity of your condition. Mild neutropenia may not require treatment. Treatment may include medicines, such as:  Antibiotic medicine given through an IV tube.  Antiviral medicines.  Antifungal medicines.  A medicine to increase neutrophil production (colony-stimulating factor). You may get this drug through an IV tube or by injection.  Steroids given through an IV tube.  If an underlying condition is causing neutropenia, you may need treatment for that condition. If medicines you are taking are causing neutropenia, your health care provider may have you stop taking those medicines. Follow these instructions at home: Medicines  Take over-the-counter and prescription medicines only as told by your health care provider.  Get a seasonal flu shot (influenza vaccine). Lifestyle  Do not eat unpasteurized foods.Do not eat unwashed raw fruits or vegetables.  Avoid exposure to groups of people or children.  Avoid being around people who are sick.  Avoid being around dirt or dust, such as in construction areas or gardens.  Do not provide direct care for pets. Avoid animal droppings. Do not clean litter boxes and bird cages. Hygiene   Bathe daily.  Clean the area between the genitals and the anus (perineal area) after you urinate or have a bowel movement. If you are male, wipe from front to back.  Brush your teeth with a soft toothbrush before and after meals.  Do not use a razor that has a blade. Use an electric razor to remove hair.  Wash your hands often. Make sure others who come in contact with you also wash their hands. If soap and water  are not available, use hand sanitizer. General instructions  Do not have sex unless your health care provider has approved.  Take actions to avoid cuts and burns. For example: ? Be cautious when you use knives. Always cut away from yourself. ? Keep knives in protective sheaths or  guards when not in use. ? Use oven mitts when you cook with a hot stove, oven, or grill. ? Stand a safe distance away from open fires.  Avoid people who received a vaccine in the past 30 days if that vaccine contained a live version of the germ (live vaccine). You should not get a live vaccine. Common live vaccines are varicella, measles, mumps, and rubella.  Do not share food utensils.  Do not use tampons, enemas, or rectal suppositories unless your health care provider has approved.  Keep all appointments as told by your health care provider. This is important. Contact a health care provider if:  You have a fever.  You have chills or you start to shake.  You have: ? A sore throat. ? A warm, red, or tender area on your skin. ? A cough. ? Frequent or painful urination. ? Vaginal discharge or itching.  You develop: ? Sores in your mouth or anus. ? Swollen lymph nodes. ? Red streaks on the skin. ? A rash.  You feel: ? Nauseous or you vomit. ? Very fatigued. ? Short of breath. This information is not intended to replace advice given to you by your health care provider. Make sure you discuss any questions you have with your health care provider. Document Released: 07/17/2001 Document Revised: 07/03/2015 Document Reviewed: 08/07/2014 Elsevier Interactive Patient Education  2018 [employer]  

## 2017-01-21 ENCOUNTER — Other Ambulatory Visit: Payer: Self-pay

## 2017-01-21 ENCOUNTER — Emergency Department (HOSPITAL_COMMUNITY): Payer: Medicare Other

## 2017-01-21 ENCOUNTER — Inpatient Hospital Stay (HOSPITAL_COMMUNITY)
Admission: EM | Admit: 2017-01-21 | Discharge: 2017-01-25 | DRG: 872 | Disposition: A | Discharge status: Home / Self Care | Payer: Medicare Other | Attending: Internal Medicine | Admitting: Internal Medicine

## 2017-01-21 ENCOUNTER — Inpatient Hospital Stay (HOSPITAL_COMMUNITY): Payer: Medicare Other

## 2017-01-21 ENCOUNTER — Encounter (HOSPITAL_COMMUNITY): Payer: Self-pay

## 2017-01-21 DIAGNOSIS — E113299 Type 2 diabetes mellitus with mild nonproliferative diabetic retinopathy without macular edema, unspecified eye: Secondary | ICD-10-CM | POA: Diagnosis present

## 2017-01-21 DIAGNOSIS — Z8546 Personal history of malignant neoplasm of prostate: Secondary | ICD-10-CM

## 2017-01-21 DIAGNOSIS — E876 Hypokalemia: Secondary | ICD-10-CM | POA: Diagnosis not present

## 2017-01-21 DIAGNOSIS — Z8249 Family history of ischemic heart disease and other diseases of the circulatory system: Secondary | ICD-10-CM | POA: Diagnosis not present

## 2017-01-21 DIAGNOSIS — A4159 Other Gram-negative sepsis: Secondary | ICD-10-CM | POA: Diagnosis present

## 2017-01-21 DIAGNOSIS — C787 Secondary malignant neoplasm of liver and intrahepatic bile duct: Secondary | ICD-10-CM | POA: Diagnosis present

## 2017-01-21 DIAGNOSIS — I2510 Atherosclerotic heart disease of native coronary artery without angina pectoris: Secondary | ICD-10-CM | POA: Diagnosis present

## 2017-01-21 DIAGNOSIS — C770 Secondary and unspecified malignant neoplasm of lymph nodes of head, face and neck: Secondary | ICD-10-CM | POA: Diagnosis present

## 2017-01-21 DIAGNOSIS — R748 Abnormal levels of other serum enzymes: Secondary | ICD-10-CM

## 2017-01-21 DIAGNOSIS — Z7902 Long term (current) use of antithrombotics/antiplatelets: Secondary | ICD-10-CM

## 2017-01-21 DIAGNOSIS — K8309 Other cholangitis: Secondary | ICD-10-CM | POA: Diagnosis not present

## 2017-01-21 DIAGNOSIS — C01 Malignant neoplasm of base of tongue: Secondary | ICD-10-CM | POA: Diagnosis present

## 2017-01-21 DIAGNOSIS — I252 Old myocardial infarction: Secondary | ICD-10-CM | POA: Diagnosis not present

## 2017-01-21 DIAGNOSIS — Z9861 Coronary angioplasty status: Secondary | ICD-10-CM

## 2017-01-21 DIAGNOSIS — Z87440 Personal history of urinary (tract) infections: Secondary | ICD-10-CM | POA: Diagnosis not present

## 2017-01-21 DIAGNOSIS — K8010 Calculus of gallbladder with chronic cholecystitis without obstruction: Secondary | ICD-10-CM | POA: Diagnosis present

## 2017-01-21 DIAGNOSIS — Z79899 Other long term (current) drug therapy: Secondary | ICD-10-CM | POA: Diagnosis not present

## 2017-01-21 DIAGNOSIS — I248 Other forms of acute ischemic heart disease: Secondary | ICD-10-CM | POA: Diagnosis not present

## 2017-01-21 DIAGNOSIS — Z85828 Personal history of other malignant neoplasm of skin: Secondary | ICD-10-CM

## 2017-01-21 DIAGNOSIS — K5900 Constipation, unspecified: Secondary | ICD-10-CM | POA: Diagnosis present

## 2017-01-21 DIAGNOSIS — Z9049 Acquired absence of other specified parts of digestive tract: Secondary | ICD-10-CM

## 2017-01-21 DIAGNOSIS — E1140 Type 2 diabetes mellitus with diabetic neuropathy, unspecified: Secondary | ICD-10-CM | POA: Diagnosis present

## 2017-01-21 DIAGNOSIS — Z8042 Family history of malignant neoplasm of prostate: Secondary | ICD-10-CM | POA: Diagnosis not present

## 2017-01-21 DIAGNOSIS — Z794 Long term (current) use of insulin: Secondary | ICD-10-CM

## 2017-01-21 DIAGNOSIS — A419 Sepsis, unspecified organism: Secondary | ICD-10-CM | POA: Diagnosis present

## 2017-01-21 DIAGNOSIS — C799 Secondary malignant neoplasm of unspecified site: Secondary | ICD-10-CM

## 2017-01-21 DIAGNOSIS — R9389 Abnormal findings on diagnostic imaging of other specified body structures: Secondary | ICD-10-CM

## 2017-01-21 DIAGNOSIS — E785 Hyperlipidemia, unspecified: Secondary | ICD-10-CM | POA: Diagnosis present

## 2017-01-21 DIAGNOSIS — N179 Acute kidney failure, unspecified: Secondary | ICD-10-CM | POA: Diagnosis present

## 2017-01-21 DIAGNOSIS — Z888 Allergy status to other drugs, medicaments and biological substances status: Secondary | ICD-10-CM

## 2017-01-21 DIAGNOSIS — Z955 Presence of coronary angioplasty implant and graft: Secondary | ICD-10-CM

## 2017-01-21 DIAGNOSIS — K810 Acute cholecystitis: Secondary | ICD-10-CM

## 2017-01-21 DIAGNOSIS — IMO0001 Reserved for inherently not codable concepts without codable children: Secondary | ICD-10-CM

## 2017-01-21 DIAGNOSIS — Z923 Personal history of irradiation: Secondary | ICD-10-CM

## 2017-01-21 DIAGNOSIS — I10 Essential (primary) hypertension: Secondary | ICD-10-CM | POA: Diagnosis present

## 2017-01-21 DIAGNOSIS — E1142 Type 2 diabetes mellitus with diabetic polyneuropathy: Secondary | ICD-10-CM | POA: Diagnosis present

## 2017-01-21 DIAGNOSIS — J90 Pleural effusion, not elsewhere classified: Secondary | ICD-10-CM | POA: Diagnosis present

## 2017-01-21 DIAGNOSIS — R7989 Other specified abnormal findings of blood chemistry: Secondary | ICD-10-CM

## 2017-01-21 DIAGNOSIS — K8020 Calculus of gallbladder without cholecystitis without obstruction: Secondary | ICD-10-CM

## 2017-01-21 DIAGNOSIS — I340 Nonrheumatic mitral (valve) insufficiency: Secondary | ICD-10-CM | POA: Diagnosis not present

## 2017-01-21 DIAGNOSIS — E872 Acidosis: Secondary | ICD-10-CM | POA: Diagnosis present

## 2017-01-21 DIAGNOSIS — R531 Weakness: Secondary | ICD-10-CM

## 2017-01-21 DIAGNOSIS — Z9221 Personal history of antineoplastic chemotherapy: Secondary | ICD-10-CM | POA: Diagnosis not present

## 2017-01-21 DIAGNOSIS — I25119 Atherosclerotic heart disease of native coronary artery with unspecified angina pectoris: Secondary | ICD-10-CM | POA: Diagnosis not present

## 2017-01-21 LAB — BLOOD CULTURE ID PANEL (REFLEXED)
ACINETOBACTER BAUMANNII: NOT DETECTED
CANDIDA ALBICANS: NOT DETECTED
CANDIDA GLABRATA: NOT DETECTED
CANDIDA TROPICALIS: NOT DETECTED
Candida krusei: NOT DETECTED
Candida parapsilosis: NOT DETECTED
Carbapenem resistance: NOT DETECTED
ENTEROBACTER CLOACAE COMPLEX: NOT DETECTED
ENTEROBACTERIACEAE SPECIES: DETECTED — AB
ESCHERICHIA COLI: NOT DETECTED
Enterococcus species: NOT DETECTED
HAEMOPHILUS INFLUENZAE: NOT DETECTED
Klebsiella oxytoca: NOT DETECTED
Klebsiella pneumoniae: DETECTED — AB
LISTERIA MONOCYTOGENES: NOT DETECTED
NEISSERIA MENINGITIDIS: NOT DETECTED
PROTEUS SPECIES: NOT DETECTED
PSEUDOMONAS AERUGINOSA: NOT DETECTED
STREPTOCOCCUS AGALACTIAE: NOT DETECTED
STREPTOCOCCUS PNEUMONIAE: NOT DETECTED
STREPTOCOCCUS PYOGENES: NOT DETECTED
STREPTOCOCCUS SPECIES: NOT DETECTED
Serratia marcescens: NOT DETECTED
Staphylococcus aureus (BCID): NOT DETECTED
Staphylococcus species: NOT DETECTED

## 2017-01-21 LAB — I-STAT CG4 LACTIC ACID, ED
LACTIC ACID, VENOUS: 2.09 mmol/L — AB (ref 0.5–1.9)
Lactic Acid, Venous: 1.45 mmol/L (ref 0.5–1.9)

## 2017-01-21 LAB — COMPREHENSIVE METABOLIC PANEL
ALBUMIN: 2.3 g/dL — AB (ref 3.5–5.0)
ALT: 148 U/L — AB (ref 17–63)
ALT: 104 U/L — ABNORMAL HIGH (ref 17–63)
ANION GAP: 7 (ref 5–15)
AST: 251 U/L — ABNORMAL HIGH (ref 15–41)
AST: 109 U/L — AB (ref 15–41)
Albumin: 3 g/dL — ABNORMAL LOW (ref 3.5–5.0)
Alkaline Phosphatase: 363 U/L — ABNORMAL HIGH (ref 38–126)
Alkaline Phosphatase: 249 U/L — ABNORMAL HIGH (ref 38–126)
Anion gap: 10 (ref 5–15)
BILIRUBIN TOTAL: 4.6 mg/dL — AB (ref 0.3–1.2)
BUN: 23 mg/dL — ABNORMAL HIGH (ref 6–20)
BUN: 20 mg/dL (ref 6–20)
CALCIUM: 9.4 mg/dL (ref 8.9–10.3)
CHLORIDE: 99 mmol/L — AB (ref 101–111)
CHLORIDE: 108 mmol/L (ref 101–111)
CO2: 25 mmol/L (ref 22–32)
CO2: 23 mmol/L (ref 22–32)
CREATININE: 1.41 mg/dL — AB (ref 0.61–1.24)
Calcium: 7.8 mg/dL — ABNORMAL LOW (ref 8.9–10.3)
Creatinine, Ser: 1.21 mg/dL (ref 0.61–1.24)
GFR calc Af Amer: >60 mL/min (ref >60)
GFR, EST AFRICAN AMERICAN: 56 mL/min — AB (ref >60)
GFR, EST NON AFRICAN AMERICAN: 48 mL/min — AB (ref >60)
GFR, EST NON AFRICAN AMERICAN: 58 mL/min — AB (ref >60)
Glucose, Bld: 175 mg/dL — ABNORMAL HIGH (ref 65–99)
Glucose, Bld: 119 mg/dL — ABNORMAL HIGH (ref 65–99)
POTASSIUM: 3.7 mmol/L (ref 3.5–5.1)
Potassium: 3.8 mmol/L (ref 3.5–5.1)
Sodium: 134 mmol/L — ABNORMAL LOW (ref 135–145)
Sodium: 138 mmol/L (ref 135–145)
TOTAL PROTEIN: 6.3 g/dL — AB (ref 6.5–8.1)
Total Bilirubin: 4.3 mg/dL — ABNORMAL HIGH (ref 0.3–1.2)
Total Protein: 5.1 g/dL — ABNORMAL LOW (ref 6.5–8.1)

## 2017-01-21 LAB — CBC WITH DIFFERENTIAL/PLATELET
BASOS ABS: 0 10*3/uL (ref 0.0–0.1)
BASOS PCT: 0 %
EOS ABS: 0 10*3/uL (ref 0.0–0.7)
EOS PCT: 0 %
HCT: 40.9 % (ref 39.0–52.0)
HEMOGLOBIN: 13.8 g/dL (ref 13.0–17.0)
LYMPHS ABS: 0.3 10*3/uL — AB (ref 0.7–4.0)
Lymphocytes Relative: 4 %
MCH: 30.1 pg (ref 26.0–34.0)
MCHC: 33.7 g/dL (ref 30.0–36.0)
MCV: 89.3 fL (ref 78.0–100.0)
Monocytes Absolute: 0.5 10*3/uL (ref 0.1–1.0)
Monocytes Relative: 7 %
NEUTROS PCT: 89 %
Neutro Abs: 6.8 10*3/uL (ref 1.7–7.7)
PLATELETS: 135 10*3/uL — AB (ref 150–400)
RBC: 4.58 MIL/uL (ref 4.22–5.81)
RDW: 14 % (ref 11.5–15.5)
WBC: 7.7 10*3/uL (ref 4.0–10.5)

## 2017-01-21 LAB — MRSA PCR SCREENING: MRSA by PCR: NEGATIVE

## 2017-01-21 LAB — URINALYSIS, ROUTINE W REFLEX MICROSCOPIC
GLUCOSE, UA: 50 mg/dL — AB
HGB URINE DIPSTICK: NEGATIVE
Ketones, ur: 5 mg/dL — AB
LEUKOCYTES UA: NEGATIVE
NITRITE: NEGATIVE
PROTEIN: 100 mg/dL — AB
Specific Gravity, Urine: 1.02 (ref 1.005–1.030)
pH: 5 (ref 5.0–8.0)

## 2017-01-21 LAB — GLUCOSE, CAPILLARY
GLUCOSE-CAPILLARY: 115 mg/dL — AB (ref 65–99)
GLUCOSE-CAPILLARY: 108 mg/dL — AB (ref 65–99)
GLUCOSE-CAPILLARY: 103 mg/dL — AB (ref 65–99)

## 2017-01-21 LAB — TROPONIN I
TROPONIN I: 0.13 ng/mL — AB (ref <0.03)
TROPONIN I: 0.6 ng/mL — AB (ref <0.03)
Troponin I: 0.68 ng/mL (ref <0.03)

## 2017-01-21 MED ORDER — CLOPIDOGREL BISULFATE 75 MG PO TABS: 75.0000 mg | ORAL_TABLET | Freq: Every day | ORAL | Status: DC

## 2017-01-21 MED ORDER — VANCOMYCIN HCL IN DEXTROSE 1-5 GM/200ML-% IV SOLN
1000.0000 mg | Freq: Once | INTRAVENOUS | Status: AC
  Administered 2017-01-21: 1000 mg via INTRAVENOUS
  Filled 2017-01-21: qty 200

## 2017-01-21 MED ORDER — SODIUM CHLORIDE 0.9 % IV SOLN
INTRAVENOUS | Status: DC
  Administered 2017-01-21 – 2017-01-23 (×5): via INTRAVENOUS

## 2017-01-21 MED ORDER — MIRTAZAPINE 15 MG PO TABS
30.0000 mg | ORAL_TABLET | Freq: Every day | ORAL | Status: DC
  Administered 2017-01-22 – 2017-01-24 (×3): 30 mg via ORAL
  Filled 2017-01-21: qty 1
  Filled 2017-01-21 (×2): qty 2
  Filled 2017-01-21: qty 1
  Filled 2017-01-21: qty 2

## 2017-01-21 MED ORDER — CHLORHEXIDINE GLUCONATE 0.12 % MT SOLN
15.0000 mL | Freq: Two times a day (BID) | OROMUCOSAL | Status: DC
  Administered 2017-01-22 – 2017-01-25 (×6): 15 mL via OROMUCOSAL
  Filled 2017-01-21 (×5): qty 15

## 2017-01-21 MED ORDER — INSULIN ASPART 100 UNIT/ML ~~LOC~~ SOLN
0.0000 [IU] | Freq: Three times a day (TID) | SUBCUTANEOUS | Status: DC
  Administered 2017-01-22: 2 [IU] via SUBCUTANEOUS
  Administered 2017-01-22: 3 [IU] via SUBCUTANEOUS
  Administered 2017-01-23: 2 [IU] via SUBCUTANEOUS
  Administered 2017-01-23: 3 [IU] via SUBCUTANEOUS

## 2017-01-21 MED ORDER — ORAL CARE MOUTH RINSE
15.0000 mL | Freq: Two times a day (BID) | OROMUCOSAL | Status: DC
  Administered 2017-01-22 – 2017-01-24 (×5): 15 mL via OROMUCOSAL

## 2017-01-21 MED ORDER — HEPARIN SODIUM (PORCINE) 5000 UNIT/ML IJ SOLN
5000.0000 [IU] | Freq: Three times a day (TID) | INTRAMUSCULAR | Status: DC
  Administered 2017-01-21: 5000 [IU] via SUBCUTANEOUS
  Filled 2017-01-21 (×2): qty 1

## 2017-01-21 MED ORDER — SODIUM CHLORIDE 0.9 % IV BOLUS (SEPSIS)
1000.0000 mL | Freq: Once | INTRAVENOUS | Status: AC
  Administered 2017-01-21: 1000 mL via INTRAVENOUS

## 2017-01-21 MED ORDER — PANTOPRAZOLE SODIUM 40 MG IV SOLR
40.0000 mg | INTRAVENOUS | Status: DC
  Administered 2017-01-21 – 2017-01-24 (×4): 40 mg via INTRAVENOUS
  Filled 2017-01-21 (×4): qty 40

## 2017-01-21 MED ORDER — DEXTROSE 5 % IV SOLN
2.0000 g | Freq: Once | INTRAVENOUS | Status: AC
  Administered 2017-01-21: 2 g via INTRAVENOUS
  Filled 2017-01-21: qty 2

## 2017-01-21 MED ORDER — POLYETHYLENE GLYCOL 3350 17 G PO PACK
17.0000 g | PACK | Freq: Every day | ORAL | Status: DC
  Administered 2017-01-23 – 2017-01-25 (×2): 17 g via ORAL
  Filled 2017-01-21 (×3): qty 1

## 2017-01-21 MED ORDER — ONDANSETRON HCL 4 MG/2ML IJ SOLN
4.0000 mg | Freq: Four times a day (QID) | INTRAMUSCULAR | Status: DC | PRN
  Administered 2017-01-21 – 2017-01-23 (×2): 4 mg via INTRAVENOUS
  Filled 2017-01-21 (×2): qty 2

## 2017-01-21 MED ORDER — DEXTROSE 5 % IV SOLN
1.0000 g | Freq: Three times a day (TID) | INTRAVENOUS | Status: DC
  Filled 2017-01-21: qty 1

## 2017-01-21 MED ORDER — PIPERACILLIN-TAZOBACTAM 3.375 G IVPB
3.3750 g | Freq: Three times a day (TID) | INTRAVENOUS | Status: DC
  Administered 2017-01-21 – 2017-01-23 (×6): 3.375 g via INTRAVENOUS
  Filled 2017-01-21 (×6): qty 50

## 2017-01-21 MED ORDER — VANCOMYCIN HCL IN DEXTROSE 1-5 GM/200ML-% IV SOLN
1000.0000 mg | INTRAVENOUS | Status: DC
  Administered 2017-01-21: 1000 mg via INTRAVENOUS
  Filled 2017-01-21: qty 200

## 2017-01-21 MED ORDER — ACETAMINOPHEN 500 MG PO TABS
500.0000 mg | ORAL_TABLET | Freq: Four times a day (QID) | ORAL | Status: DC | PRN
  Administered 2017-01-21 – 2017-01-24 (×3): 500 mg via ORAL
  Filled 2017-01-21 (×4): qty 1

## 2017-01-21 NOTE — Progress Notes (Signed)
PHARMACY - PHYSICIAN COMMUNICATION CRITICAL VALUE ALERT - BLOOD CULTURE IDENTIFICATION (BCID)  Benjamin Barr is an 71 y.o. 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   Source may be intra-abdominal, CT shows possible cholecystitis with gallbladder distention.  Name of physician (or Provider) Contacted: Lovey Newcomer  Current antibiotics: Vancomycin and Zosyn  Changes to prescribed antibiotics recommended: continuing broad spectrum antibiotics due to concern for cholecystitis   Results for orders placed or performed during the hospital encounter of 01/21/17  Blood Culture ID Panel (Reflexed) (Collected: 01/21/2017  1:50 AM)  Result Value Ref Range   Enterococcus species NOT DETECTED NOT DETECTED   Listeria monocytogenes NOT DETECTED NOT DETECTED   Staphylococcus species NOT DETECTED NOT DETECTED   Staphylococcus aureus NOT DETECTED NOT DETECTED   Streptococcus species NOT DETECTED NOT DETECTED   Streptococcus agalactiae NOT DETECTED NOT DETECTED   Streptococcus pneumoniae NOT DETECTED NOT DETECTED   Streptococcus pyogenes NOT DETECTED NOT DETECTED   Acinetobacter baumannii NOT DETECTED NOT DETECTED   Enterobacteriaceae species DETECTED (A) NOT DETECTED   Enterobacter cloacae complex NOT DETECTED NOT DETECTED   Escherichia coli NOT DETECTED NOT DETECTED   Klebsiella oxytoca NOT DETECTED NOT DETECTED   Klebsiella pneumoniae DETECTED (A) NOT DETECTED   Proteus species NOT DETECTED NOT DETECTED   Serratia marcescens NOT DETECTED NOT DETECTED   Carbapenem resistance NOT DETECTED NOT DETECTED   Haemophilus influenzae NOT DETECTED NOT DETECTED   Neisseria meningitidis NOT DETECTED NOT DETECTED   Pseudomonas aeruginosa NOT DETECTED NOT DETECTED   Candida albicans NOT DETECTED NOT DETECTED   Candida glabrata NOT DETECTED NOT DETECTED   Candida krusei  NOT DETECTED NOT DETECTED   Candida parapsilosis NOT DETECTED NOT DETECTED   Candida tropicalis NOT DETECTED NOT DETECTED    Peggyann Juba, PharmD, BCPS Pager: [phone] 01/21/2017  7:43 PM

## 2017-01-21 NOTE — Progress Notes (Signed)
Benjamin Barr   DOB:1945-09-10   FU#:[id number]     Tongue cancer ([place])   08/20/2015 Miscellaneous    He was evaluated by ENT      08/22/2015 Imaging    CT neck showed malignant adenopathy right supraclavicular region compatible with metastatic disease. Biopsy recommended. No definite pharyngeal mass lesion identified by CT.       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 No other primary or metastatic cancer. 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         11/13/2015 Procedure    Placement of 20 French pull-through percutaneous gastrostomy tube.      03/11/2016 PET scan    Resolution of hypermetabolic right cervical lymphadenopathy since prior study . No other hypermetabolic lymphadenopathy identified. New hypermetabolic lesion and caudate lobe of liver, highly suspicious for liver metastasis. Recommend liver protocol abdomen MRI without and with contrast for further evaluation. New patchy airspace disease in both lung apices with FDG uptake. 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 Recommend continued attention on follow-up imaging.       03/19/2016 Imaging    MRI showed 3.4 cm heterogeneously enhancing mass in caudate lobe of liver, consistent liver metastasis. No other sites of metastatic disease identified within the liver or abdomen      03/26/2016 Procedure    Ultrasound and fluoroscopically guided right internal jugular  single lumen power port catheter insertion. Tip in the SVC/RA junction. 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 Metabolic response to therapy of caudate lobe liver metastasis. Low-level activity in this area is similar to the surrounding liver.  2. Hypermetabolism in the right inguinal crease without concurrent adenopathy. Especially given subtle surrounding edema, favor cellulitis. Consider physical exam correlation. 3. Lack of visualization of previously described 7 mm left lower lobe pulmonary nodule. 4. Diffuse marrow hypermetabolism, favoring stimulation by chemotherapy.      09/20/2016 PET scan    1. Reduced activity in the hypermetabolic caudate lobe lesion of the liver, likely a metastatic lesion. 2. Scattered likely inflammatory activity along the glenohumeral and acromioclavicular joints and in the hips. 3. Healing of bilateral upper rib fractures. 4. Other imaging findings of potential clinical significance: Aortic Atherosclerosis (ICD10-I70.0). Coronary and carotid atherosclerosis. Photopenic right renal cysts.      11/17/2016 Procedure  Successful removal of 20 French pull-through gastrostomy tube.      12/09/2016 PET scan    1. Slight increase in size and degree of uptake associated with caudate lobe lesion consistent with metastatic disease 2. Increase in size and FDG uptake associated with portacaval lymph node 3. Aortic Atherosclerosis (ICD10-I70.0). Coronary atherosclerotic changes noted.      12/27/2016 -  Chemotherapy    He received carboplatin  and taxol       Cancer of base of tongue ([facility])   Subjective: I was notified of his admission.  The patient started to feel unwell last night with fever and chills.  He also had nausea and vomiting.  He fell off his bed because of profound weakness.  He denies chest pain, shortness of breath or palpitation.  When he was brought into the emergency department, he was noted to be febrile with grossly abnormal liver enzymes and irregular heartbeat along with elevated troponin. Preliminary cause of his symptoms could be due to undiagnosed cholangitis/sepsis.  Multiple consultation and workup is in progress. At present time, he has mild persistent nausea.  He had constipation at home.  He denies recent diarrhea.  Last documented fever was 101.   Objective:  Vitals:   01/21/17 1200 01/21/17 1238  BP: (!) 127/41   Pulse: (!) 37   Resp: 20   Temp:  (!) 101.1 F (38.4 C)  SpO2: 97%      Intake/Output Summary (Last 24 hours) at 01/21/2017 1240 Last data filed at 01/21/2017 6283 Gross per 24 hour  Intake 1200 ml  Output -  Net 1200 ml    GENERAL:alert, no distress and comfortable SKIN: skin color, texture, turgor are normal, no rashes or significant lesions EYES: normal, Conjunctiva are pink and non-injected, sclera clear OROPHARYNX:no exudate, no erythema and lips, buccal mucosa, and tongue normal  NECK: supple, thyroid normal size, non-tender, without nodularity LYMPH:  no palpable lymphadenopathy in the cervical, axillary or inguinal LUNGS: clear to auscultation and percussion with normal breathing effort HEART: irregular rate & rhythm and no murmurs and no lower extremity edema ABDOMEN:abdomen soft, non-tender and normal bowel sounds Musculoskeletal:no cyanosis of digits and no clubbing  NEURO: alert & oriented x 3 with fluent speech, no focal motor/sensory deficits   Labs:  Lab Results  Component Value Date   WBC 7.7 01/21/2017   HGB 13.8 01/21/2017   HCT 40.9 01/21/2017   MCV  89.3 01/21/2017   PLT 135 (L) 01/21/2017   NEUTROABS 6.8 01/21/2017    Lab Results  Component Value Date   NA 134 (L) 01/21/2017   K 3.8 01/21/2017   CL 99 (L) 01/21/2017   CO2 25 01/21/2017    Studies:  Ct Abdomen Pelvis Wo Contrast  Result Date: 01/21/2017 CLINICAL DATA:  Abdominal distension. Head neck cancer with liver metastasis. On chemotherapy. EXAM: CT ABDOMEN AND PELVIS WITHOUT CONTRAST TECHNIQUE: Multidetector CT imaging of the abdomen and pelvis was performed following the standard protocol without IV contrast. COMPARISON:  12/09/2016 PET. FINDINGS: Lower chest: Right greater than left base atelectasis. Normal heart size with multivessel coronary artery atherosclerosis. Trace right pleural fluid is new. Hepatobiliary: Minimal exclusion of the hepatic dome. Subtle low-density caudate lobe lesion is felt to be similar, on the order of 3.2 x 2.4 cm on image 12/series 2. Small gallstones. Mild gallbladder distension. Subtle pericholecystic edema, including image 21/series 2. No biliary duct dilatation. Pancreas: Mild pancreatic atrophy, without duct dilatation or dominant mass. Spleen: Normal in size, without  focal abnormality. Adrenals/Urinary Tract: Normal adrenal glands. Punctate left renal collecting system calculi, most apparent on coronal reformats. Low-density right renal lesions are likely cysts, including at 5.0 cm. No hydroureter or ureteric calculi. No bladder calculi. trace air within the nondependent bladder, including on image 75/series 2. Stomach/Bowel: Normal stomach, without wall thickening. Scattered colonic diverticula. Normal terminal ileum. Normal small bowel. Vascular/Lymphatic: Aortic and branch vessel atherosclerosis. 1.3 cm portal caval node is similar to on the prior. No pelvic sidewall adenopathy. Reproductive: Mild prostatomegaly. Other: No significant free fluid.  No free intraperitoneal air. Musculoskeletal:Right iliac bone island.  Thoracolumbar spondylosis  IMPRESSION: 1. Cholelithiasis with gallbladder distension and subtle pericholecystic edema. Findings suspicious for acute cholecystitis. Right upper quadrant ultrasound could confirm. 2. Similar caudate lobe metastasis. Similar borderline porta hepatis adenopathy. 3. Left nephrolithiasis. 4. New tiny right pleural effusion with bibasilar atelectasis. 5. Trace air in the nondependent urinary bladder. Correlate with instrumentation. 6. Coronary artery atherosclerosis. Aortic Atherosclerosis (ICD10-I70.0). Electronically Signed   By: Abigail Miyamoto M.D.   On: 01/21/2017 10:19   Dg Chest 2 View  Result Date: 01/21/2017 CLINICAL DATA:  Fever, weakness and cough for 24 hours. EXAM: CHEST  2 VIEW COMPARISON:  04/12/2016 FINDINGS: Right jugular port extends to the right atrium.The lungs are clear. No pleural effusion. Normal pulmonary vasculature. Hilar, mediastinal and cardiac contours are unremarkable unchanged. IMPRESSION: No acute cardiopulmonary findings. Electronically Signed   By: Andreas Newport M.D.   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  I have canceled that appointment pending further workup here in the hospital  Possible acute cholecystitis versus cholangitis He is receiving IV antibiotics Right upper quadrant ultrasound is pending I would defer to primary service/general surgery for further management  Elevated troponin The cause is unknown but could be related to demand ischemia I would defer to cardiologist for further management  Acute renal failure Could be due to dehydration/sepsis He is receiving hydration therapy  Discharge planning He is too ill to consider discharge I will follow next week Please call if questions arise  Heath Lark, MD 01/21/2017  12:40 PM

## 2017-01-21 NOTE — ED Triage Notes (Signed)
Pt brought in from home via EMS with c/o general weakness and chills  Pt states it started about 2145 this tonight  Pt fell trying to get into bed tonight due to weakness but denies injury  Pt reports he has been developing a cough throughout the day today  Pt has tongue cancer that has mets to his liver  Pt is having chemo  Last treatment was 2 weeks ago  Pt reports having low WBC count

## 2017-01-21 NOTE — Progress Notes (Signed)
Pharmacy Antibiotic Note  Benjamin Barr is a 72 y.o. male admitted on 01/21/2017 with sepsis.  Pharmacy has been consulted for vancomycin and cefepime dosing. Patient with met tongue cancer receiving chemo. UA neg for pyuria.  1st doses of vancomycin and cefepime given in ED  Today, 01/21/2017  Mild AKI - expect will improve with MIVF  WBC WNL  LA increased - repeat level WNL  febrile  Plan:  Vancomycin 1gm IV q24h - 1st dose when gets to ICU to complete loading dose  F/u SCr in am  Cefepime changed to zosyn per MD  Follow renal function as suspect will need to increase dose  Await cultures and ability to narrow  Height: 5' 8"  (172.7 cm) Weight: 171 lb (77.6 kg) IBW/kg (Calculated) : 68.4  Temp (24hrs), Avg:102.2 F (39 C), Min:102 F (38.9 C), Max:102.4 F (39.1 C)  Recent Labs  Lab 01/21/17 0129 01/21/17 0138 01/21/17 0448  WBC 7.7  --   --   CREATININE 1.41*  --   --   LATICACIDVEN  --  2.09* 1.45    Estimated Creatinine Clearance: 45.1 mL/min (A) (by C-G formula based on SCr of 1.41 mg/dL (H)).    Allergies  Allergen Reactions  . Cetuximab Anaphylaxis and Other (See Comments)    Cardiopulmonary arrest    Antimicrobials this admission: 12/14 vanco >> 12/14 cefepime >>  Dose adjustments this admission:  Microbiology results: 12/14 Bcx:   Thank you for allowing pharmacy to be a part of this patient's care.  Doreene Eland, PharmD, BCPS.   Pager: [phone] 01/21/2017 9:18 AM

## 2017-01-21 NOTE — Progress Notes (Signed)
Pt seen and evaluated - non-tender on exam. Febrile with elevated LFTs. LFTs were normal on 12/4. surgery will follow results of RUQ U/S. Pt may still need a HIDA scan to confirm cholecystitis and evaluate biliary tree given hyperbilrubinemoa.  Hold plavix. Agree with empiric IV abx, cardiology eval, and touching base with oncology. Will keep NPO in case needs HIDA scan. If pt does have cholecystitis, likely recommend perc cholecystostomy tube given immunocompromised state, MMP, and pending cardiac workup.    Formal consult note to follow.   Obie Dredge, PA-C [facility] Pager: [phone] Consults: [phone] Mon-Fri 7:00 am-4:30 pm Sat-Sun 7:00 am-11:30 am

## 2017-01-21 NOTE — Consult Note (Signed)
[facility] Surgery Consult Note  LAWTON DOLLINGER 1945-09-10  [id number].    Requesting MD: Tawanna Solo, MD Chief Complaint/Reason for Consult: possible cholecystitis  HPI:  Mr. 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 Pt reports poor oral intake and generalized weakness for 3 days. [place] next to his bed yesterday evening and states he was unable to get up because he was so weak. His wife states she helped him back in to bed but he began to have chills/rigors so they decided to come to the hospital.   The patient denies abdominal pain, history of gallstones, post-prandial pain, diarrhea, or nausea. He reports that at baseline he has no issues w PO intake or dysphagia to solids/liquids. He reports that he has been constipated this week, last BM 5 days ago described as formed and non-bloody. He has been on chemotherapy since march 2018. He is currently on plavix. He has history of anaphylactic rxn to cetuximab but no other known drug allergies. Denies use of tobacco, alcohol, or illicit drugs. Currently takes plavix. Past abdominal surgeries include partial colectomy > 20 years ago for diverticulitis, cryoablation for prostate cancer, and IR gastrostomy tube placement/removal.  ROS: Review of Systems  Constitutional: Positive for chills, fever and malaise/fatigue.  Respiratory: Negative for shortness of breath.   Cardiovascular: Negative for chest pain.  Gastrointestinal: Positive for vomiting (one episode bilious emesis today in ICU). Negative for abdominal pain, blood in stool, diarrhea, melena and nausea.  Musculoskeletal: Positive for back pain (low back pain, chronic).  Neurological: Positive for weakness. Negative for dizziness, loss of consciousness and headaches.  All other systems reviewed and are negative.     Family History  Problem Relation Age of Onset  . Diverticulitis Mother   . Heart failure Father   . Cancer Father        prostate ca    Past Medical History:  Diagnosis Date  . Anemia   . CAD (coronary artery disease)    a. 2008 PCI: Taxus DES  to mRCA, prox & mid LCFX;  b. 10/2014 MV: extensive ischemia in the RCA territory and small area of anterior ischemia;  c. 11/2014 PCI: LM nl, LAD 30ost/prox, 80d, LCX 90ost (3.0x12 Promus Prem DES), OM2 30, RCA 22m(3.0x12 Promus Prem DES), 157mRPDA 90 (PTCA), EF 55-65%.  . Cancer ([facility]   Head & neck ca  . Diverticulosis of colon   . Heart murmur   . History of colon polyps    2014--  hyperplastic, adenoma, and benign polyps  . History of radiation therapy 10/01/15- 11/20/15   Oropharynx/base of neck 70 Gy in 35 fractions  . Hyperlipidemia   . Hypertension   . Myocardial infarction ([facility]  . Nonproliferative diabetic retinopathy ([facility]  . Obesity   . Peripheral neuropathy   . Pneumonia    hx  . Prostate cancer ([facility]  . RBBB (right bundle branch block)   . Skin cancer   . Type 2 diabetes mellitus ([facility]    Past Surgical History:  Procedure Laterality Date  . APPENDECTOMY    . CARDIAC CATHETERIZATION N/A 11/13/2014   Procedure: Left Heart Cath and Coronary Angiography;  Surgeon: [name] JoMartiniqueMD;  [facility];  Service: Cardiovascular;  Laterality: N/A;  .  CARDIAC CATHETERIZATION  11/13/2014   Procedure: Coronary Stent Intervention;  Surgeon: [name] [name], MD;  Location: [place] [facility];  Service: Cardiovascular;;  . CARDIOVASCULAR STRESS TEST  01-17-2012  dr [name]   Normal perfusion study/ no ischemia or infarction/  normal LVF and wall motion, ef 54%  . CORONARY ANGIOPLASTY WITH STENT PLACEMENT  05/04/2006   dr [name]   Severe 2-vessel obstructive CAD/  normal LVF/  PCI with DES to proxima and mid CFX and mRCA (total 3 DES)  . CRYOABLATION N/A 04/01/2014   Procedure: CRYO ABLATION PROSTATE;  Surgeon:  [name], MD;  Location: [facility];  Service: Urology;  Laterality: N/A;  . DIRECT LARYNGOSCOPY N/A 09/19/2015   Procedure: DIRECT LARYNGOSCOPY;  Surgeon: Izora Gala, MD;  Location: [place];  Service: ENT;  Laterality: N/A;  . ESOPHAGOSCOPY N/A 09/19/2015   Procedure: ESOPHAGOSCOPY;  Surgeon: Izora Gala, MD;  Location: [place];  Service: ENT;  Laterality: N/A;  . FLOOR OF MOUTH BIOPSY N/A 09/19/2015   Procedure: FLOOR OF MOUTH BIOPSY;  Surgeon: Izora Gala, MD;  [facility];  Service: ENT;  Laterality: N/A;  . INSERTION OF SUPRAPUBIC CATHETER N/A 04/01/2014   Procedure: INSERTION OF SUPRAPUBIC CATHETER;  Surgeon: [name], MD;  Location: [facility];  Service: Urology;  Laterality: N/A;  suprapubic   . IR GASTROSTOMY TUBE REMOVAL  11/17/2016  . IR GENERIC HISTORICAL  11/13/2015   IR GASTROSTOMY TUBE MOD SED 11/13/2015 [name], DO [facility]  . IR GENERIC HISTORICAL  03/26/2016   IR US GUIDE VASC ACCESS RIGHT 03/26/2016 [name], MD [facility]  . IR GENERIC HISTORICAL  03/26/2016   IR FLUORO GUIDE PORT INSERTION RIGHT 03/26/2016 [name], MD [facility]  . MULTIPLE TOOTH EXTRACTIONS  09/17/2015  . PARTIAL COLECTOMY  80's    Social History:  reports that  has never smoked. he has never used smokeless tobacco. He reports that he drinks alcohol. He reports that he does not use drugs.  Allergies:  Allergies  Allergen Reactions  . Cetuximab Anaphylaxis and Other (See Comments)    Cardiopulmonary arrest    Medications Prior to Admission  Medication Sig Dispense Refill  . clopidogrel (PLAVIX) 75 MG tablet TAKE 1 TABLET EVERY DAY 90 tablet 0  . insulin glargine (LANTUS) 100 UNIT/ML injection Inject 0.5 mLs (50 Units total) into the skin at bedtime. 10 mL   . mirtazapine (REMERON) 30 MG tablet Take 1 tablet (30 mg total) by mouth at bedtime. 30 tablet 1  . ondansetron (ZOFRAN) 8 MG tablet Take 1 tablet (8 mg total) by  mouth every 8 (eight) hours as needed for nausea or vomiting. 60 tablet 11  . prochlorperazine (COMPAZINE) 10 MG tablet Take 1 tablet (10 mg total) by mouth every 6 (six) hours as needed for nausea or vomiting. 60 tablet 11    Blood pressure (!) 147/64, pulse (!) 38, temperature (!) 102 F (38.9 C), temperature source Rectal, resp. rate (!) 25, height 5' 8"  (1.727 m), weight 77.6 kg (171 lb), SpO2 97 %. Physical Exam: Physical Exam  Constitutional: He is oriented to person, place, and time. He appears well-developed. No distress.  HENT:  Head: Normocephalic and atraumatic.  Right Ear: External ear normal.  Left Ear: External ear normal.  Eyes: Conjunctivae are normal. Pupils are equal, round, and reactive to light. Right eye exhibits no discharge. Left eye exhibits no discharge. Scleral icterus is present.  Neck: Normal range  of motion. No JVD present. No tracheal deviation present.  Cardiovascular: Regular rhythm and normal heart sounds. Exam reveals no friction rub.  No murmur heard. Pulmonary/Chest: Effort normal and breath sounds normal. No stridor. No respiratory distress. He has no wheezes. He exhibits no tenderness.  Abdominal: Soft. Bowel sounds are normal. He exhibits no distension and no mass. There is no tenderness. There is no guarding.  Scar from previous G-tube healing appropriately.  Musculoskeletal: Normal range of motion. He exhibits no edema or deformity.  Neurological: He is alert and oriented to person, place, and time. No sensory deficit.  Skin: Skin is warm and dry. No rash noted. He is not diaphoretic. No pallor.  Psychiatric: He has a normal mood and affect. His behavior is normal.    Results for orders placed or performed during the hospital encounter of 01/21/17 (from the past 48 hour(s))  Comprehensive metabolic panel     Status: Abnormal   Collection Time: 01/21/17  1:29 AM  Result Value Ref Range   Sodium 134 (L) 135 - 145 mmol/L   Potassium 3.8 3.5 - 5.1  mmol/L   Chloride 99 (L) 101 - 111 mmol/L   CO2 25 22 - 32 mmol/L   Glucose, Bld 175 (H) 65 - 99 mg/dL   BUN 23 (H) 6 - 20 mg/dL   Creatinine, Ser 1.41 (H) 0.61 - 1.24 mg/dL   Calcium 9.4 8.9 - 10.3 mg/dL   Total Protein 6.3 (L) 6.5 - 8.1 g/dL   Albumin 3.0 (L) 3.5 - 5.0 g/dL   AST 251 (H) 15 - 41 U/L   ALT 148 (H) 17 - 63 U/L   Alkaline Phosphatase 363 (H) 38 - 126 U/L   Total Bilirubin 4.6 (H) 0.3 - 1.2 mg/dL   GFR calc non Af Amer 48 (L) >60 mL/min   GFR calc Af Amer 56 (L) >60 mL/min    Comment: (NOTE) The eGFR has been calculated using the CKD EPI equation. This calculation has not been validated in all clinical situations. eGFR's persistently <60 mL/min signify possible Chronic Kidney Disease.    Anion gap 10 5 - 15  CBC WITH DIFFERENTIAL     Status: Abnormal   Collection Time: 01/21/17  1:29 AM  Result Value Ref Range   WBC 7.7 4.0 - 10.5 K/uL   RBC 4.58 4.22 - 5.81 MIL/uL   Hemoglobin 13.8 13.0 - 17.0 g/dL   HCT 40.9 39.0 - 52.0 %   MCV 89.3 78.0 - 100.0 fL   MCH 30.1 26.0 - 34.0 pg   MCHC 33.7 30.0 - 36.0 g/dL   RDW 14.0 11.5 - 15.5 %   Platelets 135 (L) 150 - 400 K/uL   Neutrophils Relative % 89 %   Neutro Abs 6.8 1.7 - 7.7 K/uL   Lymphocytes Relative 4 %   Lymphs Abs 0.3 (L) 0.7 - 4.0 K/uL   Monocytes Relative 7 %   Monocytes Absolute 0.5 0.1 - 1.0 K/uL   Eosinophils Relative 0 %   Eosinophils Absolute 0.0 0.0 - 0.7 K/uL   Basophils Relative 0 %   Basophils Absolute 0.0 0.0 - 0.1 K/uL  Troponin I     Status: Abnormal   Collection Time: 01/21/17  1:29 AM  Result Value Ref Range   Troponin I 0.13 (HH) <0.03 ng/mL    Comment: CRITICAL RESULT CALLED TO, READ BACK BY AND VERIFIED WITHAmalia Greenhouse RN [id number] 01/21/17 A NAVARRO   I-Stat CG4 Lactic Acid, ED  (not  at  [facility])     Status: Abnormal   Collection Time: 01/21/17  1:38 AM  Result Value Ref Range   Lactic Acid, Venous 2.09 (HH) 0.5 - 1.9 mmol/L   Comment NOTIFIED PHYSICIAN   I-Stat CG4 Lactic Acid, ED   (not at  [facility])     Status: None   Collection Time: 01/21/17  4:48 AM  Result Value Ref Range   Lactic Acid, Venous 1.45 0.5 - 1.9 mmol/L  Urinalysis, Routine w reflex microscopic     Status: Abnormal   Collection Time: 01/21/17  5:40 AM  Result Value Ref Range   Color, Urine AMBER (A) YELLOW    Comment: BIOCHEMICALS MAY BE AFFECTED BY COLOR   APPearance HAZY (A) CLEAR   Specific Gravity, Urine 1.020 1.005 - 1.030   pH 5.0 5.0 - 8.0   Glucose, UA 50 (A) NEGATIVE mg/dL   Hgb urine dipstick NEGATIVE NEGATIVE   Bilirubin Urine SMALL (A) NEGATIVE   Ketones, ur 5 (A) NEGATIVE mg/dL   Protein, ur 100 (A) NEGATIVE mg/dL   Nitrite NEGATIVE NEGATIVE   Leukocytes, UA NEGATIVE NEGATIVE   RBC / HPF 0-5 0 - 5 RBC/hpf   WBC, UA 0-5 0 - 5 WBC/hpf   Bacteria, UA RARE (A) NONE SEEN   Squamous Epithelial / LPF 0-5 (A) NONE SEEN   Mucus PRESENT   Troponin I (q 6hr x 3)     Status: Abnormal   Collection Time: 01/21/17  9:17 AM  Result Value Ref Range   Troponin I 0.60 (HH) <0.03 ng/mL    Comment: CRITICAL VALUE NOTED.  VALUE IS CONSISTENT WITH PREVIOUSLY REPORTED AND CALLED VALUE.  Glucose, capillary     Status: Abnormal   Collection Time: 01/21/17 11:20 AM  Result Value Ref Range   Glucose-Capillary 115 (H) 65 - 99 mg/dL   Ct Abdomen Pelvis Wo Contrast  Result Date: 01/21/2017 CLINICAL DATA:  Abdominal distension. Head neck cancer with liver metastasis. On chemotherapy. EXAM: CT ABDOMEN AND PELVIS WITHOUT CONTRAST TECHNIQUE: Multidetector CT imaging of the abdomen and pelvis was performed following the standard protocol without IV contrast. COMPARISON:  12/09/2016 PET. FINDINGS: Lower chest: Right greater than left base atelectasis. Normal heart size with multivessel coronary artery atherosclerosis. Trace right pleural fluid is new. Hepatobiliary: Minimal exclusion of the hepatic dome. Subtle low-density caudate lobe lesion is felt to be similar, on the order of 3.2 x 2.4 cm on image 12/series 2.  Small gallstones. Mild gallbladder distension. Subtle pericholecystic edema, including image 21/series 2. No biliary duct dilatation. Pancreas: Mild pancreatic atrophy, without duct dilatation or dominant mass. Spleen: Normal in size, without focal abnormality. Adrenals/Urinary Tract: Normal adrenal glands. Punctate left renal collecting system calculi, most apparent on coronal reformats. Low-density right renal lesions are likely cysts, including at 5.0 cm. No hydroureter or ureteric calculi. No bladder calculi. trace air within the nondependent bladder, including on image 75/series 2. Stomach/Bowel: Normal stomach, without wall thickening. Scattered colonic diverticula. Normal terminal ileum. Normal small bowel. Vascular/Lymphatic: Aortic and branch vessel atherosclerosis. 1.3 cm portal caval node is similar to on the prior. No pelvic sidewall adenopathy. Reproductive: Mild prostatomegaly. Other: No significant free fluid.  No free intraperitoneal air. Musculoskeletal:Right iliac bone island.  Thoracolumbar spondylosis IMPRESSION: 1. Cholelithiasis with gallbladder distension and subtle pericholecystic edema. Findings suspicious for acute cholecystitis. Right upper quadrant ultrasound could confirm. 2. Similar caudate lobe metastasis. Similar borderline porta hepatis adenopathy. 3. Left nephrolithiasis. 4. New tiny right pleural effusion with bibasilar atelectasis.  5. Trace air in the nondependent urinary bladder. Correlate with instrumentation. 6. Coronary artery atherosclerosis. Aortic Atherosclerosis (ICD10-I70.0). Electronically Signed   By: Abigail Miyamoto M.D.   On: 01/21/2017 10:19   Dg Chest 2 View  Result Date: 01/21/2017 CLINICAL DATA:  Fever, weakness and cough for 24 hours. EXAM: CHEST  2 VIEW COMPARISON:  04/12/2016 FINDINGS: Right jugular port extends to the right atrium.The lungs are clear. No pleural effusion. Normal pulmonary vasculature. Hilar, mediastinal and cardiac contours are unremarkable  unchanged. IMPRESSION: No acute cardiopulmonary findings. Electronically Signed   By: Andreas Newport M.D.   On: 01/21/2017 02:52   Assessment/Plan Cancer of tongue with metastasis to liver - on chemo  DM AKI HTN HLD CAD- on plavix; Hx PCI; elevated troponin this admission, cardiology consulted.  Right pleural effusion - trace, new; noted on CT 12/14  Sepsis unknown etiology  Cholelithiasis - noted on CT scan w/ mild peri cholecystitic fluid; RUQ U/S pending. Clinically no history of symptomatic cholelithiasis and benign abdominal exam. Abnormal LFTs - LFTs were normal 12/4. Elevated 2/2 primary liver problem/cancer vs cholecystitis.  hyperbilirubinemia - repeat CMET. No obvious ductal dilation on CT scan to suggest choledocholithiasis or CBD obstruction. MRCP may be required to better evaluate.  Plan - empiric zosyn. Tylenol PRN for fever. Follow results of U/S. Patient may need HIDA to r/o cholecystitis. Also may require MRCP/GI consult for further workup of hyperbilirubinemia/abnormal LFTs. We will follow.      Jill Alexanders, [facility] Surgery 01/21/2017, 12:37 PM Pager: [phone] Consults: [phone] Mon-Fri 7:00 am-4:30 pm Sat-Sun 7:00 am-11:30 am

## 2017-01-21 NOTE — ED Notes (Signed)
Dr. Loni Muse paged regarding troponin

## 2017-01-21 NOTE — H&P (Addendum)
History and Physical    Benjamin Barr FIE:[id number] DOB: June 08, 1945 DOA: 01/21/2017  PCP: Aura Dials, MD   Patient coming from: Home    Chief Complaint: Increased generalized weakness.  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 Patient was apparently all right until yesterday.  But he was complaining of poor oral intake since last 2-3 days.  Yesterday when he was about to go to his bed around 9 PM, he felt very weak.  He started to shiver and it lasted about an hour.  He could not get up from his bed.  Patient wanted to grab something on the floor from his bed and he slipped down to the floor and could not get up .Her wife took a temperature and he was found to have fever of 102 F.  They called 911 and patient was brought to the emergency department.  Patient also reported to have dry cough since last several days.  He denies any chest pain.  He has not passed stool for a week but denies any abdominal pain. Patient was diagnosed with tongue cancer about a year ago and follows with oncology.  His last chemotherapy is about 2 weeks ago and his next chemotherapy is on coming Monday.  He completed 3 cycles of chemotherapy already.  Follows with Dr. Alvy Bimler. When patient presented to the emergency department he was found to have high-grade fever.  He was tachycardic and tachypneic.  Sepsis was suspected.  He was saturating 85% on room air.  Lactic acid was elevated.  Patient was started on broad-spectrum antibiotics with vancomycin and cefepime.  Blood cultures were sent.  Chest Xray done in the  emergency department did not show any infiltrates.  Urinalysis was not suggestive  of UTI.   ED Course: Patient is  started on IV fluids and broad-spectrum antibiotics.  Blood culture sent.  Review of Systems: As per HPI otherwise 10 point review of systems negative.    Past Medical History:  Diagnosis Date  . Anemia   . CAD (coronary artery disease)    a. 2008 PCI: Taxus DES  to mRCA, prox & mid LCFX;  b. 10/2014 MV: extensive ischemia in the RCA territory and small area of anterior ischemia;  c. 11/2014 PCI: LM nl, LAD 30ost/prox, 80d, LCX 90ost (3.0x12 Promus Prem DES), OM2 30, RCA 18m (3.0x12 Promus Prem DES), 2m, RPDA 90 (PTCA), EF 55-65%.  . Cancer ([place])    Head & neck ca  . Diverticulosis of colon   . Heart murmur   . History of colon polyps    2014--  hyperplastic, adenoma, and benign polyps  . History of radiation therapy 10/01/15- 11/20/15   Oropharynx/base of neck 70 Gy in 35 fractions  . Hyperlipidemia   . Hypertension   . Myocardial infarction ([place])   . Nonproliferative diabetic retinopathy ([place])   . Obesity   . Peripheral neuropathy   . Pneumonia    hx  . Prostate cancer ([place])   . RBBB (right bundle branch block)   . Skin cancer   . Type 2 diabetes mellitus ([place])     Past Surgical History:  Procedure Laterality Date  . APPENDECTOMY    . CARDIAC CATHETERIZATION N/A 11/13/2014   Procedure: Left Heart Cath and Coronary Angiography;  Surgeon: [name] [name], MD;  Location: [place] [facility];  Service: Cardiovascular;  Laterality: N/A;  . CARDIAC CATHETERIZATION  11/13/2014   Procedure: Coronary Stent Intervention;  Surgeon: [name] [name], MD;  Location: [place] [facility];  Service: Cardiovascular;;  . CARDIOVASCULAR STRESS TEST  01-17-2012  dr [name]   Normal perfusion study/ no ischemia or infarction/  normal LVF and wall motion, ef 54%  . CORONARY ANGIOPLASTY WITH STENT PLACEMENT  05/04/2006   dr [name]   Severe 2-vessel obstructive CAD/  normal LVF/  PCI with DES to proxima and mid CFX and mRCA (total 3 DES)  . CRYOABLATION N/A 04/01/2014   Procedure: CRYO ABLATION PROSTATE;   Surgeon: [name], MD;  Location: [facility];  Service: Urology;  Laterality: N/A;  . DIRECT LARYNGOSCOPY N/A 09/19/2015   Procedure: DIRECT LARYNGOSCOPY;  Surgeon: Izora Gala, MD;  Location: [place];  Service: ENT;  Laterality: N/A;  . ESOPHAGOSCOPY N/A 09/19/2015   Procedure: ESOPHAGOSCOPY;  Surgeon: Izora Gala, MD;  Location: [place];  Service: ENT;  Laterality: N/A;  . FLOOR OF MOUTH BIOPSY N/A 09/19/2015   Procedure: FLOOR OF MOUTH BIOPSY;  Surgeon: Izora Gala, MD;  Location: [facility] OR;  Service: ENT;  Laterality: N/A;  . INSERTION OF SUPRAPUBIC CATHETER N/A 04/01/2014   Procedure: INSERTION OF SUPRAPUBIC CATHETER;  Surgeon: [name], MD;  Location: [facility];  Service: Urology;  Laterality: N/A;  suprapubic   . IR GASTROSTOMY TUBE REMOVAL  11/17/2016  . IR GENERIC HISTORICAL  11/13/2015   IR GASTROSTOMY TUBE MOD SED 11/13/2015 [name], DO [facility]  . IR GENERIC HISTORICAL  03/26/2016   IR US GUIDE VASC ACCESS RIGHT 03/26/2016 [name], MD [facility]  . IR GENERIC HISTORICAL  03/26/2016   IR FLUORO GUIDE PORT INSERTION RIGHT 03/26/2016 [name], MD [facility]  . MULTIPLE TOOTH EXTRACTIONS  09/17/2015  . PARTIAL COLECTOMY  80's    Social history: Denies smoking and alcohol intake.  Denies any drug intake  Allergies  Allergen Reactions  . Cetuximab Anaphylaxis and Other (See Comments)    Cardiopulmonary arrest    Family History  Problem Relation Age of Onset  . Diverticulitis Mother   . Heart failure Father   . Cancer Father        prostate ca     Prior to Admission medications   Medication Sig Start Date End Date Taking? Authorizing Provider  clopidogrel (PLAVIX) 75 MG tablet TAKE 1 TABLET EVERY DAY 10/25/16  Yes [name], [name], MD  insulin glargine (LANTUS) 100 UNIT/ML injection Inject 0.5 mLs (50 Units total) into the skin at bedtime. 11/04/16  Yes [name], [name], MD  mirtazapine (REMERON) 30 MG tablet  Take 1 tablet (30 mg total) by mouth at bedtime. 01/03/17  Yes Gorsuch, Ni, MD  ondansetron (ZOFRAN) 8 MG tablet Take 1 tablet (8 mg total) by mouth every 8 (eight) hours as needed for nausea or vomiting. 01/11/17  Yes Heath Lark, MD  prochlorperazine (COMPAZINE) 10 MG tablet Take 1 tablet (10 mg total) by mouth every 6 (six) hours as needed for nausea or vomiting. 01/11/17  Yes Heath Lark, MD    Physical Exam: Vitals:   01/21/17 0630 01/21/17 0700 01/21/17 0730 01/21/17 0800  BP: 111/72 (!) 116/59 (!) 123/53 110/74  Pulse: (!) 29 (!) 50 (!) 103 89  Resp: (!) 29 20 14 17   Temp:      TempSrc:  SpO2: 97% 94% 96% 96%  Weight:      Height:        Constitutional: NAD, calm, comfortable Vitals:   01/21/17 0630 01/21/17 0700 01/21/17 0730 01/21/17 0800  BP: 111/72 (!) 116/59 (!) 123/53 110/74  Pulse: (!) 29 (!) 50 (!) 103 89  Resp: (!) 29 20 14 17   Temp:      TempSrc:      SpO2: 97% 94% 96% 96%  Weight:      Height:       Eyes: PERRL, lids and conjunctivae normal ENMT: Mucous membranes are moist. Posterior pharynx clear of any exudate or lesions.Normal dentition.  Neck: normal, supple, no masses, no thyromegaly Respiratory: Bilateral mild  basal crackles. Normal respiratory effort. No accessory muscle use.  Cardiovascular: Sinus tachycardia , no murmurs / rubs / gallops. No extremity edema. 2+ pedal pulses. No carotid bruits.  Abdomen: no tenderness, no masses palpated. No hepatosplenomegaly. Bowel sounds positive.  Musculoskeletal: no clubbing / cyanosis. No joint deformity upper and lower extremities. Good ROM, no contractures. Normal muscle tone.  Skin: no rashes, lesions, ulcers. No induration Neurologic: CN 2-12 grossly intact. Sensation intact, DTR normal. Strength 5/5 in all 4.  Psychiatric: Normal judgment and insight. Alert and oriented x 3. Normal mood.    Labs on Admission: I have personally reviewed following labs and imaging studies  CBC: Recent Labs  Lab  01/21/17 0129  WBC 7.7  NEUTROABS 6.8  HGB 13.8  HCT 40.9  MCV 89.3  PLT 638*   Basic Metabolic Panel: Recent Labs  Lab 01/21/17 0129  NA 134*  K 3.8  CL 99*  CO2 25  GLUCOSE 175*  BUN 23*  CREATININE 1.41*  CALCIUM 9.4   GFR: Estimated Creatinine Clearance: 45.1 mL/min (A) (by C-G formula based on SCr of 1.41 mg/dL (H)). Liver Function Tests: Recent Labs  Lab 01/21/17 0129  AST 251*  ALT 148*  ALKPHOS 363*  BILITOT 4.6*  PROT 6.3*  ALBUMIN 3.0*   No results for input(s): LIPASE, AMYLASE in the last 168 hours. No results for input(s): AMMONIA in the last 168 hours. Coagulation Profile: No results for input(s): INR, PROTIME in the last 168 hours. Cardiac Enzymes: Recent Labs  Lab 01/21/17 0129  TROPONINI 0.13*   BNP (last 3 results) No results for input(s): PROBNP in the last 8760 hours. HbA1C: No results for input(s): HGBA1C in the last 72 hours. CBG: No results for input(s): GLUCAP in the last 168 hours. Lipid Profile: No results for input(s): CHOL, HDL, LDLCALC, TRIG, CHOLHDL, LDLDIRECT in the last 72 hours. Thyroid Function Tests: No results for input(s): TSH, T4TOTAL, FREET4, T3FREE, THYROIDAB in the last 72 hours. Anemia Panel: No results for input(s): VITAMINB12, FOLATE, FERRITIN, TIBC, IRON, RETICCTPCT in the last 72 hours. Urine analysis:    Component Value Date/Time   COLORURINE AMBER (A) 01/21/2017 0540   APPEARANCEUR HAZY (A) 01/21/2017 0540   LABSPEC 1.020 01/21/2017 0540   PHURINE 5.0 01/21/2017 0540   GLUCOSEU 50 (A) 01/21/2017 0540   HGBUR NEGATIVE 01/21/2017 0540   BILIRUBINUR SMALL (A) 01/21/2017 0540   KETONESUR 5 (A) 01/21/2017 0540   PROTEINUR 100 (A) 01/21/2017 0540   UROBILINOGEN 1.0 11/14/2014 0920   NITRITE NEGATIVE 01/21/2017 0540   LEUKOCYTESUR NEGATIVE 01/21/2017 0540    Radiological Exams on Admission: Dg Chest 2 View  Result Date: 01/21/2017 CLINICAL DATA:  Fever, weakness and cough for 24 hours. EXAM: CHEST  2  VIEW COMPARISON:  04/12/2016 FINDINGS: Right  jugular port extends to the right atrium.The lungs are clear. No pleural effusion. Normal pulmonary vasculature. Hilar, mediastinal and cardiac contours are unremarkable unchanged. IMPRESSION: No acute cardiopulmonary findings. Electronically Signed   By: Andreas Newport M.D.   On: 01/21/2017 02:52    EKG: Independently reviewed.  Assessment/Plan Principal Problem:   Sepsis ([place]) Active Problems:   CAD S/P percutaneous coronary angioplasty   Diabetes mellitus with diabetic neuropathy, with long-term current use of insulin (HCC)   Cancer of base of tongue (HCC)   Constipation   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(HCC)   AKI (acute kidney injury) ([place])   Elevated liver enzymes    Sepsis: Unknown etiology.  We will follow-up blood cultures.  Urinalysis notes a history of UTI.  Port-A-Cath looks clean Started on broad-spectrum antibiotics.  Blood pressure stable. Will get CT abdomen/pelvis to look out for any source as he has history of lung cancer metastasized to liver. His white cell counts are stable.  Not neutropenic.  He had mild lactic acidosis on presentation which has been normalized.  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  Metastasis to caudate lobe of liver.  Metastasis to right supraclavicular lymph node. Status post chemotherapy and radiotherapy.  Status post 3 cycles of carboplatin 5-FU.  Currently on carboplatin and Taxol. Notified  Dr. Alvy Bimler about the patient's status and reason for admission.  Diabetes mellitus with diabetic neuropathy: On insulin at home.  We will continue sliding scale insulin here.  We will continue to monitor his blood sugars.  Acute kidney injury: Most likely prerenal.  We will continue to monitor his kidney function. Started on IV fluids.  Elevated liver enzymes: New finding.  Likely secondary to sepsis versus  progression of metastatic disease to the liver.  We will continue to monitor the liver enzymes.  Constipation: Has not passed stool for last 1 week.  We will continue MiraLAX  History of coronary artery disease: Status post PCI.  On Plavix at home which we will continue. Also found to have slightly elevated troponin on presentation.  Denies any chest pain.  Troponin elevation most likely secondary to supply demand ischemia .We will continue to monitor.  Severity of Illness:    I certify that at the point of admission it is my clinical judgment that the patient will require inpatient hospital care spanning beyond 2 midnights from the point of admission due to high intensity of service, high risk for further deterioration and high frequency of surveillance required.    DVT prophylaxis:heparin [place] Code Status: Full Family Communication:No family members were present  on the bedside. Consults called:Oncology   Marene Lenz MD [facility] Pager [phone]  If 7PM-7AM, please contact night-coverage [email or web address] Password [facility]  01/21/2017, 8:43 AM

## 2017-01-21 NOTE — ED Provider Notes (Signed)
[place] DEPT Provider Note: Georgena Spurling, MD, FACEP  CSN: [id number] MRN: [id number] ARRIVAL: 01/21/17 at [place]: [place]  Weakness   HISTORY OF PRESENT ILLNESS  01/21/17 1:30 AM Benjamin Barr is a 71 y.o. male with throat cancer metastatic to his liver currently undergoing chemotherapy.  His last chemotherapy was about 2 weeks ago.  He is here with general weakness for the past 2 days accompanied by decreased oral intake, low back pain and increased somnolence.  His only meal yesterday was a can of Boost.  He is here with fever and chills that started about 945 yesterday evening.  He had a temperature of 102 degrees at home.  On attempts to feed him Boost he vomited one time.  He fell trying to get into bed earlier but denies injuring himself.  He was unable to stand to go to the bathroom even with assistance due to weakness.  He had been having a cough throughout the day yesterday.  He was noted to be febrile on arrival with a temperature of 102 degrees.   He has not had difficulty breathing.  He has had constipation and took MiraLAX yesterday.  He had some "fullness" in his chest earlier but none now.   Past Medical History:  Diagnosis Date  . Anemia   . CAD (coronary artery disease)    a. 2008 PCI: Taxus DES  to mRCA, prox & mid LCFX;  b. 10/2014 MV: extensive ischemia in the RCA territory and small area of anterior ischemia;  c. 11/2014 PCI: LM nl, LAD 30ost/prox, 80d, LCX 90ost (3.0x12 Promus Prem DES), OM2 30, RCA 61m (3.0x12 Promus Prem DES), 29m, RPDA 90 (PTCA), EF 55-65%.  . Cancer ([place])    Head & neck ca  . Diverticulosis of colon   . Heart murmur   . History of colon polyps    2014--  hyperplastic, adenoma, and benign polyps  . History of radiation therapy 10/01/15- 11/20/15   Oropharynx/base of neck 70 Gy in 35 fractions  . Hyperlipidemia   . Hypertension   . Myocardial infarction ([place])   . Nonproliferative diabetic retinopathy ([place])     . Obesity   . Peripheral neuropathy   . Pneumonia    hx  . Prostate cancer ([place])   . RBBB (right bundle branch block)   . Skin cancer   . Type 2 diabetes mellitus ([place])     Past Surgical History:  Procedure Laterality Date  . APPENDECTOMY    . CARDIAC CATHETERIZATION N/A 11/13/2014   Procedure: Left Heart Cath and Coronary Angiography;  Surgeon: [name] [name], MD;  Location: [place] [facility];  Service: Cardiovascular;  Laterality: N/A;  . CARDIAC CATHETERIZATION  11/13/2014   Procedure: Coronary Stent Intervention;  Surgeon: [name] [name], MD;  Location: [place] [facility];  Service: Cardiovascular;;  . CARDIOVASCULAR STRESS TEST  01-17-2012  dr [name]   Normal perfusion study/ no ischemia or infarction/  normal LVF and wall motion, ef 54%  . CORONARY ANGIOPLASTY WITH STENT PLACEMENT  05/04/2006   dr [name]   Severe 2-vessel obstructive CAD/  normal LVF/  PCI with DES to proxima and mid CFX and mRCA (total 3 DES)  . CRYOABLATION N/A 04/01/2014   Procedure: CRYO ABLATION PROSTATE;  Surgeon: [name], MD;  Location: [facility];  Service: Urology;  Laterality: N/A;  . DIRECT LARYNGOSCOPY N/A 09/19/2015   Procedure: DIRECT LARYNGOSCOPY;  Surgeon: Izora Gala, MD;  Location: [place] [facility];  Service: ENT;  Laterality: N/A;  . ESOPHAGOSCOPY N/A 09/19/2015   Procedure: ESOPHAGOSCOPY;  Surgeon: Izora Gala, MD;  Location: [place];  Service: ENT;  Laterality: N/A;  . FLOOR OF MOUTH BIOPSY N/A 09/19/2015   Procedure: FLOOR OF MOUTH BIOPSY;  Surgeon: Izora Gala, MD;  Location: [facility];  Service: ENT;  Laterality: N/A;  . INSERTION OF SUPRAPUBIC CATHETER N/A 04/01/2014   Procedure: INSERTION OF SUPRAPUBIC CATHETER;  Surgeon: [name], MD;  Location: [facility];  Service: Urology;  Laterality: N/A;  suprapubic   . IR GASTROSTOMY TUBE REMOVAL  11/17/2016  . IR GENERIC HISTORICAL  11/13/2015   IR GASTROSTOMY TUBE MOD SED 11/13/2015 [name], DO [facility]  . IR GENERIC HISTORICAL  03/26/2016   IR US GUIDE VASC ACCESS RIGHT 03/26/2016 [name], MD [facility]  . IR GENERIC HISTORICAL  03/26/2016   IR FLUORO GUIDE PORT INSERTION RIGHT 03/26/2016 [name], MD [facility]  . MULTIPLE TOOTH EXTRACTIONS  09/17/2015  . PARTIAL COLECTOMY  80's    Family History  Problem Relation Age of Onset  . Diverticulitis Mother   . Heart failure Father   . Cancer Father        prostate ca    Social History   Tobacco Use  . Smoking status: Never Smoker  . Smokeless tobacco: Never Used  Substance Use Topics  . Alcohol use: Yes    Comment: occ beer  . Drug use: No    Prior to Admission medications   Medication Sig Start Date End Date Taking? Authorizing Provider  clopidogrel (PLAVIX) 75 MG tablet TAKE 1 TABLET EVERY DAY 10/25/16  Yes [name], [name], MD  insulin glargine (LANTUS) 100 UNIT/ML injection Inject 0.5 mLs (50 Units total) into the skin at bedtime. 11/04/16  Yes [name], [name], MD  mirtazapine (REMERON) 30 MG tablet Take 1 tablet (30 mg total) by mouth at bedtime. 01/03/17  Yes Gorsuch, Ni, MD  ondansetron (ZOFRAN) 8 MG tablet Take 1 tablet (8 mg total) by mouth every 8 (eight) hours as needed for nausea or vomiting. 01/11/17  Yes Heath Lark, MD  prochlorperazine (COMPAZINE) 10 MG tablet Take 1 tablet (10 mg total) by mouth every 6 (six) hours as needed for nausea or vomiting. 01/11/17  Yes Heath Lark, MD    Allergies Cetuximab   REVIEW OF SYSTEMS  Negative except as noted here or in the History of Present Illness.   PHYSICAL EXAMINATION  Initial Vital Signs Blood pressure (!) 104/55, pulse (!) 41, temperature (!) 102 F (38.9 C), temperature source Rectal, resp. rate (!) 30, height 5\' 8"  (1.727 m), weight 77.6 kg (171 lb), SpO2 92 %.  Examination General: Well-developed, well-nourished male in no acute distress; appearance consistent with age of record HENT: normocephalic; atraumatic Eyes: pupils equal, round and  reactive to light; extraocular muscles intact Neck: supple Heart: Irregular rhythm; tachycardia Lungs: clear to auscultation bilaterally; distant sounds Chest: Port-A-Cath right upper chest Abdomen: soft; nondistended; mild right upper quadrant tenderness; no masses or hepatosplenomegaly; bowel sounds present Extremities: No deformity; full range of motion; pulses normal Neurologic: Awake, alert and oriented; motor function intact in all extremities and symmetric; no facial droop Skin: Warm and dry Psychiatric: Normal mood and affect   RESULTS  Summary of this visit's results, reviewed by myself:   EKG Interpretation  Date/Time:  Friday January 21 2017 00:35:28 EST Ventricular Rate:  117 PR Interval:    QRS Duration:  156 QT Interval:  341 QTC Calculation: 476 R Axis:   -29 Text Interpretation:  Sinus Rhythm Atrial premature complexes Right bundle branch block Previously NSR without ectopy Confirmed by Jouri Threat [phone]) on 01/21/2017 1:25:42 AM      Laboratory Studies: Results for orders placed or performed during the hospital encounter of 01/21/17 (from the past 24 hour(s))  Comprehensive metabolic panel     Status: Abnormal   Collection Time: 01/21/17  1:29 AM  Result Value Ref Range   Sodium 134 (L) 135 - 145 mmol/L   Potassium 3.8 3.5 - 5.1 mmol/L   Chloride 99 (L) 101 - 111 mmol/L   CO2 25 22 - 32 mmol/L   Glucose, Bld 175 (H) 65 - 99 mg/dL   BUN 23 (H) 6 - 20 mg/dL   Creatinine, Ser 1.41 (H) 0.61 - 1.24 mg/dL   Calcium 9.4 8.9 - 10.3 mg/dL   Total Protein 6.3 (L) 6.5 - 8.1 g/dL   Albumin 3.0 (L) 3.5 - 5.0 g/dL   AST 251 (H) 15 - 41 U/L   ALT 148 (H) 17 - 63 U/L   Alkaline Phosphatase 363 (H) 38 - 126 U/L   Total Bilirubin 4.6 (H) 0.3 - 1.2 mg/dL   GFR calc non Af Amer 48 (L) >60 mL/min   GFR calc Af Amer 56 (L) >60 mL/min   Anion gap 10 5 - 15  CBC WITH DIFFERENTIAL     Status: Abnormal   Collection Time: 01/21/17  1:29 AM  Result Value Ref Range   WBC  7.7 4.0 - 10.5 K/uL   RBC 4.58 4.22 - 5.81 MIL/uL   Hemoglobin 13.8 13.0 - 17.0 g/dL   HCT 40.9 39.0 - 52.0 %   MCV 89.3 78.0 - 100.0 fL   MCH 30.1 26.0 - 34.0 pg   MCHC 33.7 30.0 - 36.0 g/dL   RDW 14.0 11.5 - 15.5 %   Platelets 135 (L) 150 - 400 K/uL   Neutrophils Relative % 89 %   Neutro Abs 6.8 1.7 - 7.7 K/uL   Lymphocytes Relative 4 %   Lymphs Abs 0.3 (L) 0.7 - 4.0 K/uL   Monocytes Relative 7 %   Monocytes Absolute 0.5 0.1 - 1.0 K/uL   Eosinophils Relative 0 %   Eosinophils Absolute 0.0 0.0 - 0.7 K/uL   Basophils Relative 0 %   Basophils Absolute 0.0 0.0 - 0.1 K/uL  Troponin I     Status: Abnormal   Collection Time: 01/21/17  1:29 AM  Result Value Ref Range   Troponin I 0.13 (HH) <0.03 ng/mL  I-Stat CG4 Lactic Acid, ED  (not at  [facility])     Status: Abnormal   Collection Time: 01/21/17  1:38 AM  Result Value Ref Range   Lactic Acid, Venous 2.09 (HH) 0.5 - 1.9 mmol/L   Comment NOTIFIED PHYSICIAN   I-Stat CG4 Lactic Acid, ED  (not at  [facility])     Status: None   Collection Time: 01/21/17  4:48 AM  Result Value Ref Range   Lactic Acid, Venous 1.45 0.5 - 1.9 mmol/L  Urinalysis, Routine w reflex microscopic     Status: Abnormal   Collection Time: 01/21/17  5:40 AM  Result Value Ref Range   Color, Urine AMBER (A) YELLOW   APPearance HAZY (A) CLEAR   Specific Gravity, Urine 1.020 1.005 - 1.030   pH 5.0 5.0 - 8.0   Glucose, UA 50 (A) NEGATIVE mg/dL   Hgb urine dipstick  NEGATIVE NEGATIVE   Bilirubin Urine SMALL (A) NEGATIVE   Ketones, ur 5 (A) NEGATIVE mg/dL   Protein, ur 100 (A) NEGATIVE mg/dL   Nitrite NEGATIVE NEGATIVE   Leukocytes, UA NEGATIVE NEGATIVE   RBC / HPF 0-5 0 - 5 RBC/hpf   WBC, UA 0-5 0 - 5 WBC/hpf   Bacteria, UA RARE (A) NONE SEEN   Squamous Epithelial / LPF 0-5 (A) NONE SEEN   Mucus PRESENT    Imaging Studies: Dg Chest 2 View  Result Date: 01/21/2017 CLINICAL DATA:  Fever, weakness and cough for 24 hours. EXAM: CHEST  2 VIEW COMPARISON:  04/12/2016  FINDINGS: Right jugular port extends to the right atrium.The lungs are clear. No pleural effusion. Normal pulmonary vasculature. Hilar, mediastinal and cardiac contours are unremarkable unchanged. IMPRESSION: No acute cardiopulmonary findings. Electronically Signed   By: Andreas Newport M.D.   On: 01/21/2017 02:52    ED COURSE  Nursing notes and initial vitals signs, including pulse oximetry, reviewed.  Vitals:   01/21/17 0500 01/21/17 0530 01/21/17 0535 01/21/17 0630  BP: (!) 113/49 (!) 119/33 (!) 124/52 111/72  Pulse: (!) 35  90 (!) 29  Resp: (!) 25 (!) 22 (!) 25 (!) 29  Temp:      TempSrc:      SpO2: 95%  96% 97%  Weight:      Height:       7:02 AM Cefepime and vancomycin ordered for febrile illness without obvious source.  Suspect possible central catheter infection.  [facility] to admit.  PROCEDURES    ED DIAGNOSES     ICD-10-CM   1. Systemic inflammatory response syndrome (SIRS) due to infectious process without acute organ dysfunction (HCC) A41.9   2. Generalized weakness R53.1   3. Metastatic cancer ([place]) C79.9        Jahlia Omura, Jenny Reichmann, MD 01/21/17 [phone]

## 2017-01-21 NOTE — Progress Notes (Signed)
This is a no charge note  Pending admission per Dr. 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 Patient had chest discomfort yesterday, but no chest pain today.  Patient was found to have sepsis with fever, tachycardia and tachypnea. Oxygen saturation 85% on room air. WBC 7.7, lactic acid of 2.09, 1.45, positive troponin 0.13, negative urinalysis, negative chest x-ray. The source of infection is not clear. Patient was started with the vancomycin and cefepime. Patient is admitted to stepdown as inpatient.   Ivor Costa, MD  [facility] Pager [phone]  If 7PM-7AM, please contact night-coverage [email or web address] Password [email or web address] 01/21/2017, 6:59 AM

## 2017-01-21 NOTE — Significant Event (Addendum)
Patient CT abdomen/pelvis showed possible cholecystitis with gallbladder distention. Also showed  Cholecystitic edema.  Will order ultrasound of the bladder. We will change antibiotic from cefepime to Zosyn.  Patient will be kept n.p.o.. Surgery consulted Second troponin also came back, trended up.  Patient denies any chest pain.  EKG did not show any significant ST changes.  Cardiology consulted.

## 2017-01-22 ENCOUNTER — Other Ambulatory Visit (HOSPITAL_COMMUNITY): Payer: Self-pay

## 2017-01-22 LAB — LACTIC ACID, PLASMA: LACTIC ACID, VENOUS: 1.1 mmol/L (ref 0.5–1.9)

## 2017-01-22 LAB — GLUCOSE, CAPILLARY
GLUCOSE-CAPILLARY: 121 mg/dL — AB (ref 65–99)
GLUCOSE-CAPILLARY: 164 mg/dL — AB (ref 65–99)
GLUCOSE-CAPILLARY: 155 mg/dL — AB (ref 65–99)
GLUCOSE-CAPILLARY: 129 mg/dL — AB (ref 65–99)
Glucose-Capillary: 116 mg/dL — ABNORMAL HIGH (ref 65–99)

## 2017-01-22 LAB — COMPREHENSIVE METABOLIC PANEL
ALK PHOS: 239 U/L — AB (ref 38–126)
ALT: 83 U/L — AB (ref 17–63)
ANION GAP: 10 (ref 5–15)
AST: 61 U/L — ABNORMAL HIGH (ref 15–41)
Albumin: 2.3 g/dL — ABNORMAL LOW (ref 3.5–5.0)
BUN: 30 mg/dL — ABNORMAL HIGH (ref 6–20)
CALCIUM: 8.3 mg/dL — AB (ref 8.9–10.3)
CO2: 22 mmol/L (ref 22–32)
CREATININE: 2.1 mg/dL — AB (ref 0.61–1.24)
Chloride: 107 mmol/L (ref 101–111)
GFR, EST AFRICAN AMERICAN: 34 mL/min — AB (ref >60)
GFR, EST NON AFRICAN AMERICAN: 30 mL/min — AB (ref >60)
Glucose, Bld: 111 mg/dL — ABNORMAL HIGH (ref 65–99)
Potassium: 4 mmol/L (ref 3.5–5.1)
SODIUM: 139 mmol/L (ref 135–145)
Total Bilirubin: 4.1 mg/dL — ABNORMAL HIGH (ref 0.3–1.2)
Total Protein: 5.5 g/dL — ABNORMAL LOW (ref 6.5–8.1)

## 2017-01-22 LAB — CBC
HCT: 36.6 % — ABNORMAL LOW (ref 39.0–52.0)
HEMOGLOBIN: 12.1 g/dL — AB (ref 13.0–17.0)
MCH: 29.6 pg (ref 26.0–34.0)
MCHC: 33.1 g/dL (ref 30.0–36.0)
MCV: 89.5 fL (ref 78.0–100.0)
PLATELETS: 104 10*3/uL — AB (ref 150–400)
RBC: 4.09 MIL/uL — AB (ref 4.22–5.81)
RDW: 14.1 % (ref 11.5–15.5)
WBC: 6.5 10*3/uL (ref 4.0–10.5)

## 2017-01-22 LAB — TROPONIN I: TROPONIN I: 0.69 ng/mL — AB (ref <0.03)

## 2017-01-22 NOTE — Progress Notes (Signed)
Pharmacy Antibiotic Note  Benjamin Barr is a 71 y.o. male admitted on 01/21/2017 with sepsis.  Pharmacy has been consulted for vancomycin and cefepime dosing. Patient with met tongue cancer receiving chemo. UA neg for pyuria.  1st doses of vancomycin and cefepime given in ED.  Cefepime to zosyn for cholecystitis.   Today, 01/22/2017  Day #2 antibiotics  AKI - worsening  + fever  WBC WNL  LA increased - repeat levels WNL  MRSA PCR neg  BCx with GNR, BCID with klebsiella  Plan:  Hold vancomycin for worsening renal fx  F/u possibility of stopping vanco with GNR bacteremia, intra-abdominal infection  Zosyn per MD - dose OK for now.  Need to watch renal fx  If vanco to continue then need to check random level in am  Height: 5' 8"  (172.7 cm) Weight: 175 lb 11.3 oz (79.7 kg) IBW/kg (Calculated) : 68.4  Temp (24hrs), Avg:100 F (37.8 C), Min:98 F (36.7 C), Max:101.4 F (38.6 C)  Recent Labs  Lab 01/21/17 0129 01/21/17 0138 01/21/17 0448 01/21/17 1500 01/22/17 0601  WBC 7.7  --   --   --  6.5  CREATININE 1.41*  --   --  1.21 2.10*  LATICACIDVEN  --  2.09* 1.45  --  1.1    Estimated Creatinine Clearance: 30.3 mL/min (A) (by C-G formula based on SCr of 2.1 mg/dL (H)).    Allergies  Allergen Reactions  . Cetuximab Anaphylaxis and Other (See Comments)    Cardiopulmonary arrest    Antimicrobials this admission: 12/14 vanco >> 12/14 cefepime >> 12/14 12/14 zosyn >>  Dose adjustments this admission:  Microbiology results: 12/14 Bcx: GNR (BCID = klebsiella pneumoniae) 12/14 MRSA PCR: neg  Thank you for allowing pharmacy to be a part of this patient's care.  Doreene Eland, PharmD, BCPS.   Pager: [phone] 01/22/2017 9:26 AM

## 2017-01-22 NOTE — Progress Notes (Signed)
Subjective/Chief Complaint: No complaints. Denies abd pain   Objective: Vital signs in last 24 hours: Temp:  [98 F (36.7 C)-101.4 F (38.6 C)] 100.1 F (37.8 C) (12/15 0420) Pulse Rate:  [28-112] 34 (12/15 0600) Resp:  [0-32] 17 (12/15 0600) BP: (96-151)/(37-96) 143/45 (12/15 0600) SpO2:  [91 %-97 %] 93 % (12/15 0600) Weight:  [79.7 kg (175 lb 11.3 oz)] 79.7 kg (175 lb 11.3 oz) (12/15 0420) Last BM Date: (one week ago per pt)  Intake/Output from previous day: 12/14 0701 - 12/15 0700 In: 3337.5 [I.V.:2837.5; IV Piggyback:500] Out: 1000 [Urine:1000] Intake/Output this shift: No intake/output data recorded.  General appearance: alert and cooperative Resp: clear to auscultation bilaterally Cardio: regular rate and rhythm GI: soft, non-tender; bowel sounds normal; no masses,  no organomegaly  Lab Results:  Recent Labs    01/21/17 0129 01/22/17 0601  WBC 7.7 6.5  HGB 13.8 12.1*  HCT 40.9 36.6*  PLT 135* 104*   BMET Recent Labs    01/21/17 0129 01/21/17 1500  NA 134* 138  K 3.8 3.7  CL 99* 108  CO2 25 23  GLUCOSE 175* 119*  BUN 23* 20  CREATININE 1.41* 1.21  CALCIUM 9.4 7.8*   PT/INR No results for input(s): LABPROT, INR in the last 72 hours. ABG No results for input(s): PHART, HCO3 in the last 72 hours.  Invalid input(s): PCO2, PO2  Studies/Results: Ct Abdomen Pelvis Wo Contrast  Result Date: 01/21/2017 CLINICAL DATA:  Abdominal distension. Head neck cancer with liver metastasis. On chemotherapy. EXAM: CT ABDOMEN AND PELVIS WITHOUT CONTRAST TECHNIQUE: Multidetector CT imaging of the abdomen and pelvis was performed following the standard protocol without IV contrast. COMPARISON:  12/09/2016 PET. FINDINGS: Lower chest: Right greater than left base atelectasis. Normal heart size with multivessel coronary artery atherosclerosis. Trace right pleural fluid is new. Hepatobiliary: Minimal exclusion of the hepatic dome. Subtle low-density caudate lobe lesion  is felt to be similar, on the order of 3.2 x 2.4 cm on image 12/series 2. Small gallstones. Mild gallbladder distension. Subtle pericholecystic edema, including image 21/series 2. No biliary duct dilatation. Pancreas: Mild pancreatic atrophy, without duct dilatation or dominant mass. Spleen: Normal in size, without focal abnormality. Adrenals/Urinary Tract: Normal adrenal glands. Punctate left renal collecting system calculi, most apparent on coronal reformats. Low-density right renal lesions are likely cysts, including at 5.0 cm. No hydroureter or ureteric calculi. No bladder calculi. trace air within the nondependent bladder, including on image 75/series 2. Stomach/Bowel: Normal stomach, without wall thickening. Scattered colonic diverticula. Normal terminal ileum. Normal small bowel. Vascular/Lymphatic: Aortic and branch vessel atherosclerosis. 1.3 cm portal caval node is similar to on the prior. No pelvic sidewall adenopathy. Reproductive: Mild prostatomegaly. Other: No significant free fluid.  No free intraperitoneal air. Musculoskeletal:Right iliac bone island.  Thoracolumbar spondylosis IMPRESSION: 1. Cholelithiasis with gallbladder distension and subtle pericholecystic edema. Findings suspicious for acute cholecystitis. Right upper quadrant ultrasound could confirm. 2. Similar caudate lobe metastasis. Similar borderline porta hepatis adenopathy. 3. Left nephrolithiasis. 4. New tiny right pleural effusion with bibasilar atelectasis. 5. Trace air in the nondependent urinary bladder. Correlate with instrumentation. 6. Coronary artery atherosclerosis. Aortic Atherosclerosis (ICD10-I70.0). Electronically Signed   By: Abigail Miyamoto M.D.   On: 01/21/2017 10:19   Dg Chest 2 View  Result Date: 01/21/2017 CLINICAL DATA:  Fever, weakness and cough for 24 hours. EXAM: CHEST  2 VIEW COMPARISON:  04/12/2016 FINDINGS: Right jugular port extends to the right atrium.The lungs are clear. No pleural effusion. Normal  pulmonary vasculature. Hilar, mediastinal and cardiac contours are unremarkable unchanged. IMPRESSION: No acute cardiopulmonary findings. Electronically Signed   By: Andreas Newport M.D.   On: 01/21/2017 02:52   US Abdomen Limited Ruq  Result Date: 01/21/2017 CLINICAL DATA:  Abnormal CT.  Abdominal distension. EXAM: ULTRASOUND ABDOMEN LIMITED RIGHT UPPER QUADRANT COMPARISON:  Abdominal CT earlier this day. FINDINGS: Gallbladder: Distended with wall thickening measuring up to 5-6 mm. Multiple shadowing intraluminal calculi. No frank pericholecystic fluid. No sonographic Murphy sign noted by sonographer. Common bile duct: Diameter: 3 mm. Liver: Central hypodense caudate lesion measures approximately 3.8 x 2.6 x 3.3 cm. No additional focal lesion. Otherwise within normal limits in parenchymal echogenicity. Portal vein is patent on color Doppler imaging with normal direction of blood flow towards the liver. IMPRESSION: 1. Distended gallbladder with gallstones and gallbladder wall thickening. Sonographic findings are concerning for acute cholecystitis, and corroborate CT findings. However sonographic Percell Miller sign is negative, and gallbladder wall thickening may be secondary to systemic causes. Nuclear medicine HIDA scan could be considered for further evaluation based on clinical concern. 2. Hypodense metastasis in the caudate measures approximately 3.3 cm, better characterized on cross-sectional imaging. Electronically Signed   By: Jeb Levering M.D.   On: 01/21/2017 21:44    Anti-infectives: Anti-infectives (From admission, onward)   Start     Dose/Rate Route Frequency Ordered Stop   01/21/17 1600  piperacillin-tazobactam (ZOSYN) IVPB 3.375 g     3.375 g 12.5 mL/hr over 240 Minutes Intravenous Every 8 hours 01/21/17 1130     01/21/17 1400  ceFEPIme (MAXIPIME) 1 g in dextrose 5 % 50 mL IVPB  Status:  Discontinued     1 g 100 mL/hr over 30 Minutes Intravenous Every 8 hours 01/21/17 1036 01/21/17 1130    01/21/17 1200  vancomycin (VANCOCIN) IVPB 1000 mg/200 mL premix     1,000 mg 200 mL/hr over 60 Minutes Intravenous Every 24 hours 01/21/17 1036     01/21/17 0645  ceFEPIme (MAXIPIME) 2 g in dextrose 5 % 50 mL IVPB     2 g 100 mL/hr over 30 Minutes Intravenous  Once 01/21/17 0643 01/21/17 0741   01/21/17 0645  vancomycin (VANCOCIN) IVPB 1000 mg/200 mL premix     1,000 mg 200 mL/hr over 60 Minutes Intravenous  Once 01/21/17 3662 01/21/17 0842      Assessment/Plan: s/p * No surgery found * Possible cholecystitis with elevated lft's  Continue empiric zosyn. HIDA may help show if inflammation of gb wall is a primary process or secondary. If we feel he is getting sick from gallbladder then an option would be to perc drain it. Elevated troponin. With his history of cardiac disease I would recommend having his cardiologist evaluate him Cancer of the tongue with mets to liver. On chemo per oncology Will follow  LOS: 1 day    TOTH III,PAUL S 01/22/2017

## 2017-01-22 NOTE — Progress Notes (Signed)
[facility] Progress Note  Patient: Benjamin Barr MPN:[id number]   PCP: Aura Dials, MD DOB: [birth year]   DOA: 01/21/2017   DOS: 01/22/2017   Date of Service: the patient was seen and examined on 01/22/2017  Subjective: Still febrile.  No chest pain no abdominal pain or shortness of breath.  No nausea no vomiting for now.  No diarrhea no constipation.  Passing gas.  Brief hospital course: Pt. with PMH of tongue cancer, liver metastasis, chemotherapy, prostate cancer, HTN, HLD, type II DM, CAD, depression; admitted on 01/21/2017, presented with complaint of generalized weakness, was found to have sepsis due to Klebsiella bacteremia likely from cholecystitis. Currently further plan is continue current treatment.  Assessment and Plan: 1.  Sepsis Klebsiella bacteremia Cholecystitis Elevated LFT. Acute kidney injury, elevated troponin. Presented with, fever, tachycardia, hypotension.  Gram-negative bacteremia seen. Started on IV vancomycin and Zosyn, MRSA PCR negative, blood cultures positive for Klebsiella therefore will narrow down antibiotics to IV Zosyn only. Continue aggressive IV hydration for now. Initially monitored on stepdown unit, since hemodynamically stable will transfer to telemetry for now. General surgery consulted, recommend conservative management with HIDA scan. Unable to perform HIDA until Monday per nuclear medicine. LFTs are getting better, may require MRCP.  May not be able to complete MRCP until Monday as well.  2.  Acute kidney injury. Continue aggressive IV hydration.  Likely prerenal etiology. Avoid vancomycin to minimize nephrotoxins. CT scan as well as ultrasound negative for hydronephrosis. Daily monitoring.  3.  Elevated troponin. History of CAD. HTN. Holding blood pressure medications for now. Troponin trend not consistent with ACS although the patient did have some similar chest fluttering that he had when he had ACS in the past. Following up  on echocardiogram. EKG unremarkable. Cardiology consulted following up on recommendation.  4.  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 S/P chemotherapy and radiation. Also on carboplatin and Taxol for now. Oncology consulted, recommend to hold chemotherapy and conservative management for now.  5.  Type 2 diabetes mellitus. Diabetic neuropathy. Continue sliding scale insulin for now. On clear liquid diet.  6.  Constipation. Currently resolved. Monitor regular  Diet: Clear liquid diet DVT Prophylaxis: subcutaneous Heparin  Advance goals of care discussion: full code  Family Communication: family was present at bedside, at the time of interview. The pt provided permission to discuss medical plan with the family. Opportunity was given to ask question and all questions were answered satisfactorily.   Disposition:  Discharge to be determined.   Consultants: General surgery Procedures: Echocardiogram   Antibiotics: Anti-infectives (From admission, onward)   Start     Dose/Rate Route Frequency Ordered Stop   01/21/17 1600  piperacillin-tazobactam (ZOSYN) IVPB 3.375 g     3.375 g 12.5 mL/hr over 240 Minutes Intravenous Every 8 hours 01/21/17 1130     01/21/17 1400  ceFEPIme (MAXIPIME) 1 g in dextrose 5 % 50 mL IVPB  Status:  Discontinued     1 g 100 mL/hr over 30 Minutes Intravenous Every 8 hours 01/21/17 1036 01/21/17 1130   01/21/17 1200  vancomycin (VANCOCIN) IVPB 1000 mg/200 mL premix  Status:  Discontinued     1,000 mg 200 mL/hr over 60 Minutes Intravenous Every 24 hours 01/21/17 1036 01/22/17 0906   01/21/17 0645  ceFEPIme (MAXIPIME) 2 g in dextrose 5 % 50 mL IVPB     2 g 100 mL/hr over 30 Minutes Intravenous  Once 01/21/17 0643 01/21/17 0741   01/21/17 0645  vancomycin (VANCOCIN) IVPB 1000 mg/200 mL premix     1,000 mg 200 mL/hr over 60 Minutes Intravenous  Once 01/21/17 0643  01/21/17 0842       Objective: Physical Exam: Vitals:   01/22/17 0800 01/22/17 1200 01/22/17 1600 01/22/17 1746  BP: (!) 134/54 (!) 154/74  (!) 120/59  Pulse: (!) 36 (!) 36  (!) 107  Resp: 15 (!) 28  (!) 22  Temp: (!) 100.4 F (38 C) 100 F (37.8 C) (!) 100.5 F (38.1 C) (!) 101.8 F (38.8 C)  TempSrc: Oral Oral Oral Oral  SpO2: 97% 98%  99%  Weight:      Height:        Intake/Output Summary (Last 24 hours) at 01/22/2017 1757 Last data filed at 01/22/2017 1700 Gross per 24 hour  Intake 3575 ml  Output 1701 ml  Net 1874 ml   Filed Weights   01/21/17 0052 01/22/17 0420  Weight: 77.6 kg (171 lb) 79.7 kg (175 lb 11.3 oz)   General: Alert, Awake and Oriented to Time, Place and Person. Appear in mild distress, affect appropriate Eyes: PERRL, Conjunctiva normal ENT: Oral Mucosa clear moist. Neck: no JVD, no Abnormal Mass Or lumps Cardiovascular: S1 and S2 Present, no Murmur, Peripheral Pulses Present Respiratory: normal respiratory effort, Bilateral Air entry equal and Decreased, no use of accessory muscle, Clear to Auscultation, no Crackles, no wheezes Abdomen: Bowel Sound present, Soft and no tenderness, no hernia Skin: no redness, no Rash, no induration Extremities: no Pedal edema, no calf tenderness Neurologic: Grossly no focal neuro deficit. Bilaterally Equal motor strength  Data Reviewed: CBC: Recent Labs  Lab 01/21/17 0129 01/22/17 0601  WBC 7.7 6.5  NEUTROABS 6.8  --   HGB 13.8 12.1*  HCT 40.9 36.6*  MCV 89.3 89.5  PLT 135* 706*   Basic Metabolic Panel: Recent Labs  Lab 01/21/17 0129 01/21/17 1500 01/22/17 0601  NA 134* 138 139  K 3.8 3.7 4.0  CL 99* 108 107  CO2 25 23 22   GLUCOSE 175* 119* 111*  BUN 23* 20 30*  CREATININE 1.41* 1.21 2.10*  CALCIUM 9.4 7.8* 8.3*    Liver Function Tests: Recent Labs  Lab 01/21/17 0129 01/21/17 1500 01/22/17 0601  AST 251* 109* 61*  ALT 148* 104* 83*  ALKPHOS 363* 249* 239*  BILITOT 4.6* 4.3* 4.1*    PROT 6.3* 5.1* 5.5*  ALBUMIN 3.0* 2.3* 2.3*   No results for input(s): LIPASE, AMYLASE in the last 168 hours. No results for input(s): AMMONIA in the last 168 hours. Coagulation Profile: No results for input(s): INR, PROTIME in the last 168 hours. Cardiac Enzymes: Recent Labs  Lab 01/21/17 0129 01/21/17 0917 01/21/17 1500 01/22/17 0601  TROPONINI 0.13* 0.60* 0.68* 0.69*   BNP (last 3 results) No results for input(s): PROBNP in the last 8760 hours. CBG: Recent Labs  Lab 01/21/17 1120 01/21/17 1611 01/21/17 2151 01/22/17 0727  GLUCAP 115* 108* 103* 121*   Studies: No results found.  Scheduled Meds: . chlorhexidine  15 mL Mouth Rinse BID  . insulin aspart  0-15 Units Subcutaneous TID WC  . mouth rinse  15 mL Mouth Rinse q12n4p  . mirtazapine  30 mg Oral QHS  . pantoprazole (PROTONIX) IV  40 mg Intravenous Q24H  . polyethylene glycol  17 g Oral Daily   Continuous Infusions: . sodium chloride 125 mL/hr at 01/22/17 1159  . piperacillin-tazobactam (ZOSYN)  IV 3.375 g (01/22/17  1525)   PRN Meds: acetaminophen, ondansetron (ZOFRAN) IV  Time spent: 35 minutes  Author: Berle Mull, MD [facility] Pager: [phone] 01/22/2017 5:57 PM  If 7PM-7AM, please contact night-coverage at [email or web address], password [email or web address]

## 2017-01-23 ENCOUNTER — Encounter (HOSPITAL_COMMUNITY): Payer: Self-pay | Admitting: Cardiology

## 2017-01-23 ENCOUNTER — Inpatient Hospital Stay (HOSPITAL_COMMUNITY): Payer: Medicare Other

## 2017-01-23 ENCOUNTER — Other Ambulatory Visit: Payer: Self-pay | Admitting: Cardiology

## 2017-01-23 DIAGNOSIS — A419 Sepsis, unspecified organism: Secondary | ICD-10-CM

## 2017-01-23 DIAGNOSIS — I340 Nonrheumatic mitral (valve) insufficiency: Secondary | ICD-10-CM

## 2017-01-23 DIAGNOSIS — I25119 Atherosclerotic heart disease of native coronary artery with unspecified angina pectoris: Secondary | ICD-10-CM

## 2017-01-23 DIAGNOSIS — I248 Other forms of acute ischemic heart disease: Secondary | ICD-10-CM

## 2017-01-23 LAB — COMPREHENSIVE METABOLIC PANEL
ALBUMIN: 2.5 g/dL — AB (ref 3.5–5.0)
ALK PHOS: 234 U/L — AB (ref 38–126)
ALT: 69 U/L — AB (ref 17–63)
AST: 51 U/L — AB (ref 15–41)
Anion gap: 10 (ref 5–15)
BUN: 33 mg/dL — ABNORMAL HIGH (ref 6–20)
CALCIUM: 8.5 mg/dL — AB (ref 8.9–10.3)
CO2: 23 mmol/L (ref 22–32)
CREATININE: 2.01 mg/dL — AB (ref 0.61–1.24)
Chloride: 109 mmol/L (ref 101–111)
GFR calc Af Amer: 36 mL/min — ABNORMAL LOW (ref >60)
GFR calc non Af Amer: 31 mL/min — ABNORMAL LOW (ref >60)
GLUCOSE: 144 mg/dL — AB (ref 65–99)
Potassium: 3.4 mmol/L — ABNORMAL LOW (ref 3.5–5.1)
SODIUM: 142 mmol/L (ref 135–145)
Total Bilirubin: 2.5 mg/dL — ABNORMAL HIGH (ref 0.3–1.2)
Total Protein: 6.1 g/dL — ABNORMAL LOW (ref 6.5–8.1)

## 2017-01-23 LAB — CBC WITH DIFFERENTIAL/PLATELET
Basophils Absolute: 0 10*3/uL (ref 0.0–0.1)
Basophils Relative: 0 %
Eosinophils Absolute: 0 10*3/uL (ref 0.0–0.7)
Eosinophils Relative: 0 %
HEMATOCRIT: 40.6 % (ref 39.0–52.0)
HEMOGLOBIN: 13.7 g/dL (ref 13.0–17.0)
LYMPHS ABS: 0.1 10*3/uL — AB (ref 0.7–4.0)
LYMPHS PCT: 3 %
MCH: 30.4 pg (ref 26.0–34.0)
MCHC: 33.7 g/dL (ref 30.0–36.0)
MCV: 90 fL (ref 78.0–100.0)
MONO ABS: 0.2 10*3/uL (ref 0.1–1.0)
MONOS PCT: 4 %
NEUTROS ABS: 4.6 10*3/uL (ref 1.7–7.7)
NEUTROS PCT: 93 %
Platelets: 91 10*3/uL — ABNORMAL LOW (ref 150–400)
RBC: 4.51 MIL/uL (ref 4.22–5.81)
RDW: 14.5 % (ref 11.5–15.5)
WBC: 5 10*3/uL (ref 4.0–10.5)

## 2017-01-23 LAB — LACTIC ACID, PLASMA: LACTIC ACID, VENOUS: 1.8 mmol/L (ref 0.5–1.9)

## 2017-01-23 LAB — GLUCOSE, CAPILLARY
GLUCOSE-CAPILLARY: 111 mg/dL — AB (ref 65–99)
GLUCOSE-CAPILLARY: 120 mg/dL — AB (ref 65–99)
Glucose-Capillary: 153 mg/dL — ABNORMAL HIGH (ref 65–99)
Glucose-Capillary: 139 mg/dL — ABNORMAL HIGH (ref 65–99)

## 2017-01-23 LAB — MAGNESIUM: Magnesium: 1.7 mg/dL (ref 1.7–2.4)

## 2017-01-23 LAB — ECHOCARDIOGRAM COMPLETE
Height: 68 in
Weight: 2811.31 oz

## 2017-01-23 MED ORDER — METOPROLOL TARTRATE 25 MG PO TABS
12.5000 mg | ORAL_TABLET | Freq: Two times a day (BID) | ORAL | Status: DC
  Administered 2017-01-23 – 2017-01-25 (×5): 12.5 mg via ORAL
  Filled 2017-01-23 (×5): qty 1

## 2017-01-23 MED ORDER — SODIUM CHLORIDE 0.9 % IV SOLN
INTRAVENOUS | Status: DC
  Administered 2017-01-23 – 2017-01-25 (×3): via INTRAVENOUS

## 2017-01-23 MED ORDER — DEXTROSE 5 % IV SOLN
2.0000 g | INTRAVENOUS | Status: DC
  Administered 2017-01-23: 2 g via INTRAVENOUS
  Filled 2017-01-23: qty 2

## 2017-01-23 NOTE — Progress Notes (Signed)
PHARMACY NOTE -  Rocephin  Pharmacy has been consulted to dose Rocephin for K pneumo bacteremia. No renal adjustment is required.   Will sign off at this time.  Please reconsult if a change in clinical status warrants re-evaluation of dosage.  Reuel Boom, PharmD, BCPS Pager: [phone] 01/23/2017, 10:44 AM

## 2017-01-23 NOTE — Progress Notes (Signed)
[facility] Progress Note  Patient: Benjamin Barr QIO:[id number]   PCP: Aura Dials, MD DOB: 04/08/1945   DOA: 01/21/2017   DOS: 01/23/2017   Date of Service: the patient was seen and examined on 01/23/2017  Subjective: Fever seems to be subsided since this morning.  No nausea or vomiting.  No constipation.  Brief hospital course: Pt. with PMH of tongue cancer, liver metastasis, chemotherapy, prostate cancer, HTN, HLD, type II DM, CAD, depression; admitted on 01/21/2017, presented with complaint of generalized weakness, was found to have sepsis due to Klebsiella bacteremia likely from cholecystitis. Currently further plan is continue current treatment.  Assessment and Plan: 1.  Sepsis Klebsiella bacteremia Cholecystitis Elevated LFT. Acute kidney injury, elevated troponin. Presented with, fever, tachycardia, hypotension.  Gram-negative bacteremia seen. Started on IV vancomycin and Zosyn,, switch to IV ceftriaxone based on sensitivity. MRSA PCR negative, blood cultures positive for Klebsiella  Continue aggressive IV hydration for now. Initially monitored on stepdown unit, telemetry for now. General surgery consulted, recommend conservative management with HIDA scan. Unable to perform HIDA until Monday per nuclear medicine. LFTs are getting better, may require MRCP.  May not be able to complete MRCP until Monday as well.  2.  Acute kidney injury. Continue aggressive IV hydration.  Likely prerenal etiology. Avoid vancomycin to minimize nephrotoxins. CT scan as well as ultrasound negative for hydronephrosis. Daily monitoring.  3.  Elevated troponin. History of CAD. HTN. Holding blood pressure medications for now. Troponin trend not consistent with ACS although the patient did have some similar chest fluttering that he had when he had ACS in the past. Following up on echocardiogram. EKG unremarkable. Cardiology consulted following up on recommendation.  4.  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 S/P chemotherapy and radiation. Also on carboplatin and Taxol for now. Oncology consulted, recommend to hold chemotherapy and conservative management for now.  5.  Type 2 diabetes mellitus. Diabetic neuropathy. Continue sliding scale insulin for now. On clear liquid diet.  6.  Constipation. Currently resolved. Monitor regular  Diet: Clear liquid diet DVT Prophylaxis: subcutaneous Heparin  Advance goals of care discussion: full code  Family Communication: family was present at bedside, at the time of interview. The pt provided permission to discuss medical plan with the family. Opportunity was given to ask question and all questions were answered satisfactorily.   Disposition:  Discharge to be determined.   Consultants: General surgery Procedures: Echocardiogram   Antibiotics: Anti-infectives (From admission, onward)   Start     Dose/Rate Route Frequency Ordered Stop   01/23/17 1800  cefTRIAXone (ROCEPHIN) 2 g in dextrose 5 % 50 mL IVPB     2 g 100 mL/hr over 30 Minutes Intravenous Every 24 hours 01/23/17 1041     01/21/17 1600  piperacillin-tazobactam (ZOSYN) IVPB 3.375 g  Status:  Discontinued     3.375 g 12.5 mL/hr over 240 Minutes Intravenous Every 8 hours 01/21/17 1130 01/23/17 1007   01/21/17 1400  ceFEPIme (MAXIPIME) 1 g in dextrose 5 % 50 mL IVPB  Status:  Discontinued     1 g 100 mL/hr over 30 Minutes Intravenous Every 8 hours 01/21/17 1036 01/21/17 1130   01/21/17 1200  vancomycin (VANCOCIN) IVPB 1000 mg/200 mL premix  Status:  Discontinued     1,000 mg 200 mL/hr over 60 Minutes Intravenous Every 24 hours 01/21/17 1036 01/22/17 0906   01/21/17 0645  ceFEPIme (MAXIPIME) 2  g in dextrose 5 % 50 mL IVPB     2 g 100 mL/hr over 30 Minutes Intravenous  Once 01/21/17 0643 01/21/17 0741   01/21/17 0645  vancomycin (VANCOCIN) IVPB 1000 mg/200 mL premix      1,000 mg 200 mL/hr over 60 Minutes Intravenous  Once 01/21/17 0643 01/21/17 0842       Objective: Physical Exam: Vitals:   01/22/17 2238 01/23/17 0447 01/23/17 1347 01/23/17 1428  BP:  (!) 167/85 (!) 154/74 (!) 109/59  Pulse:  98 91 (!) 103  Resp:  18 16 18   Temp: 98.4 F (36.9 C) 99.5 F (37.5 C) 99.7 F (37.6 C) 99.5 F (37.5 C)  TempSrc: Oral Oral Oral Oral  SpO2:  98% 99% 94%  Weight:      Height:        Intake/Output Summary (Last 24 hours) at 01/23/2017 1558 Last data filed at 01/23/2017 1429 Gross per 24 hour  Intake 2465 ml  Output 150 ml  Net 2315 ml   Filed Weights   01/21/17 0052 01/22/17 0420  Weight: 77.6 kg (171 lb) 79.7 kg (175 lb 11.3 oz)   General: Alert, Awake and Oriented to Time, Place and Person. Appear in mild distress, affect appropriate Eyes: PERRL, Conjunctiva normal ENT: Oral Mucosa clear moist. Neck: no JVD, no Abnormal Mass Or lumps Cardiovascular: S1 and S2 Present, no Murmur, Peripheral Pulses Present Respiratory: normal respiratory effort, Bilateral Air entry equal and Decreased, no use of accessory muscle, Clear to Auscultation, no Crackles, no wheezes Abdomen: Bowel Sound present, Soft and no tenderness, no hernia Skin: no redness, no Rash, no induration Extremities: no Pedal edema, no calf tenderness Neurologic: Grossly no focal neuro deficit. Bilaterally Equal motor strength  Data Reviewed: CBC: Recent Labs  Lab 01/21/17 0129 01/22/17 0601 01/23/17 0516  WBC 7.7 6.5 5.0  NEUTROABS 6.8  --  4.6  HGB 13.8 12.1* 13.7  HCT 40.9 36.6* 40.6  MCV 89.3 89.5 90.0  PLT 135* 104* 91*   Basic Metabolic Panel: Recent Labs  Lab 01/21/17 0129 01/21/17 1500 01/22/17 0601 01/23/17 0516  NA 134* 138 139 142  K 3.8 3.7 4.0 3.4*  CL 99* 108 107 109  CO2 25 23 22 23   GLUCOSE 175* 119* 111* 144*  BUN 23* 20 30* 33*  CREATININE 1.41* 1.21 2.10* 2.01*  CALCIUM 9.4 7.8* 8.3* 8.5*  MG  --   --   --  1.7    Liver Function  Tests: Recent Labs  Lab 01/21/17 0129 01/21/17 1500 01/22/17 0601 01/23/17 0516  AST 251* 109* 61* 51*  ALT 148* 104* 83* 69*  ALKPHOS 363* 249* 239* 234*  BILITOT 4.6* 4.3* 4.1* 2.5*  PROT 6.3* 5.1* 5.5* 6.1*  ALBUMIN 3.0* 2.3* 2.3* 2.5*   No results for input(s): LIPASE, AMYLASE in the last 168 hours. No results for input(s): AMMONIA in the last 168 hours. Coagulation Profile: No results for input(s): INR, PROTIME in the last 168 hours. Cardiac Enzymes: Recent Labs  Lab 01/21/17 0129 01/21/17 0917 01/21/17 1500 01/22/17 0601  TROPONINI 0.13* 0.60* 0.68* 0.69*   BNP (last 3 results) No results for input(s): PROBNP in the last 8760 hours. CBG: Recent Labs  Lab 01/22/17 1543 01/22/17 1705 01/22/17 2105 01/23/17 0734 01/23/17 1138  GLUCAP 164* 155* 129* 153* 139*   Studies: No results found.  Scheduled Meds: . chlorhexidine  15 mL Mouth Rinse BID  . insulin aspart  0-15 Units Subcutaneous TID WC  . mouth rinse  15 mL Mouth Rinse q12n4p  . metoprolol tartrate  12.5 mg Oral BID  . mirtazapine  30 mg Oral QHS  . pantoprazole (PROTONIX) IV  40 mg Intravenous Q24H  . polyethylene glycol  17 g Oral Daily   Continuous Infusions: . sodium chloride 100 mL/hr at 01/23/17 1235  . cefTRIAXone (ROCEPHIN)  IV     PRN Meds: acetaminophen, ondansetron (ZOFRAN) IV  Time spent: 35 minutes  Author: Berle Mull, MD [facility] Pager: [phone] 01/23/2017 3:58 PM  If 7PM-7AM, please contact night-coverage at [email or web address], password [email or web address]

## 2017-01-23 NOTE — Progress Notes (Signed)
Subjective/Chief Complaint: Feeling better   Objective: Vital signs in last 24 hours: Temp:  [98.4 F (36.9 C)-101.8 F (38.8 C)] 99.5 F (37.5 C) (12/16 0447) Pulse Rate:  [36-107] 98 (12/16 0447) Resp:  [18-28] 18 (12/16 0447) BP: (120-167)/(56-85) 167/85 (12/16 0447) SpO2:  [98 %-99 %] 98 % (12/16 0447) Last BM Date: (one week ago per pt)  Intake/Output from previous day: 12/15 0701 - 12/16 0700 In: 3025 [I.V.:2875; IV Piggyback:150] Out: 701 [Urine:700; Stool:1] Intake/Output this shift: No intake/output data recorded.  General appearance: alert and cooperative Resp: clear to auscultation bilaterally Cardio: regular rate and rhythm GI: soft, nontender  Lab Results:  Recent Labs    01/22/17 0601 01/23/17 0516  WBC 6.5 5.0  HGB 12.1* 13.7  HCT 36.6* 40.6  PLT 104* 91*   BMET Recent Labs    01/22/17 0601 01/23/17 0516  NA 139 142  K 4.0 3.4*  CL 107 109  CO2 22 23  GLUCOSE 111* 144*  BUN 30* 33*  CREATININE 2.10* 2.01*  CALCIUM 8.3* 8.5*   PT/INR No results for input(s): LABPROT, INR in the last 72 hours. ABG No results for input(s): PHART, HCO3 in the last 72 hours.  Invalid input(s): PCO2, PO2  Studies/Results: US Abdomen Limited Ruq  Result Date: 01/21/2017 CLINICAL DATA:  Abnormal CT.  Abdominal distension. EXAM: ULTRASOUND ABDOMEN LIMITED RIGHT UPPER QUADRANT COMPARISON:  Abdominal CT earlier this day. FINDINGS: Gallbladder: Distended with wall thickening measuring up to 5-6 mm. Multiple shadowing intraluminal calculi. No frank pericholecystic fluid. No sonographic Murphy sign noted by sonographer. Common bile duct: Diameter: 3 mm. Liver: Central hypodense caudate lesion measures approximately 3.8 x 2.6 x 3.3 cm. No additional focal lesion. Otherwise within normal limits in parenchymal echogenicity. Portal vein is patent on color Doppler imaging with normal direction of blood flow towards the liver. IMPRESSION: 1. Distended gallbladder with  gallstones and gallbladder wall thickening. Sonographic findings are concerning for acute cholecystitis, and corroborate CT findings. However sonographic Percell Miller sign is negative, and gallbladder wall thickening may be secondary to systemic causes. Nuclear medicine HIDA scan could be considered for further evaluation based on clinical concern. 2. Hypodense metastasis in the caudate measures approximately 3.3 cm, better characterized on cross-sectional imaging. Electronically Signed   By: Jeb Levering M.D.   On: 01/21/2017 21:44    Anti-infectives: Anti-infectives (From admission, onward)   Start     Dose/Rate Route Frequency Ordered Stop   01/23/17 1800  cefTRIAXone (ROCEPHIN) 2 g in dextrose 5 % 50 mL IVPB     2 g 100 mL/hr over 30 Minutes Intravenous Every 24 hours 01/23/17 1041     01/21/17 1600  piperacillin-tazobactam (ZOSYN) IVPB 3.375 g  Status:  Discontinued     3.375 g 12.5 mL/hr over 240 Minutes Intravenous Every 8 hours 01/21/17 1130 01/23/17 1007   01/21/17 1400  ceFEPIme (MAXIPIME) 1 g in dextrose 5 % 50 mL IVPB  Status:  Discontinued     1 g 100 mL/hr over 30 Minutes Intravenous Every 8 hours 01/21/17 1036 01/21/17 1130   01/21/17 1200  vancomycin (VANCOCIN) IVPB 1000 mg/200 mL premix  Status:  Discontinued     1,000 mg 200 mL/hr over 60 Minutes Intravenous Every 24 hours 01/21/17 1036 01/22/17 0906   01/21/17 0645  ceFEPIme (MAXIPIME) 2 g in dextrose 5 % 50 mL IVPB     2 g 100 mL/hr over 30 Minutes Intravenous  Once 01/21/17 0643 01/21/17 0741   01/21/17 0645  vancomycin (VANCOCIN) IVPB 1000 mg/200 mL premix     1,000 mg 200 mL/hr over 60 Minutes Intravenous  Once 01/21/17 9872 01/21/17 0842      Assessment/Plan: s/p * No surgery found * Advance diet  Cards consult, echo pending, elevated troponin Throat cancer with liver mets. Per oncology Lft's improving. HIDA pending  LOS: 2 days    TOTH III,Prakash Kimberling S 01/23/2017

## 2017-01-23 NOTE — Consult Note (Signed)
Cardiology Consultation:   Patient ID: Benjamin Barr; [id number]; 1945-11-29   Admit date: 01/21/2017 Date of Consult: 01/23/2017  Primary Care Provider: Aura Dials, MD Primary Cardiologist: Dr. [name]   Patient Profile:   Benjamin Barr is a 71 y.o. male with a history of ENT cancer with liver metastasis status post treatment with XRT and chemotherapy, prostate cancer, hypertension, hyperlipidemia, type 2 diabetes mellitus, hypertension, and CAD status post DES interventions to the RCA and circumflex, most recently 2016 who is being seen today for the evaluation of abnormal troponin I level at the request of Dr. Posey Pronto.  History of Present Illness:   [name] is currently admitted to the hospital with sepsis secondary to Klebsiella bacteremia and cholecystitis. He has been seen by general surgery with plan for conservative management at this point, HIDA scan pending. He is on IV antibiotics and has been hydrated, hemodynamically improved with transfer from stepdown to telemetry.  As part of his initial evaluation, troponin I levels were checked ranging from 0.13, 0.60, and 0.68. He states that he has had a recent "tingling" sensation in his chest, prior to coming to the hospital but that resolved spontaneously. In retrospect he recalls having similar symptoms prior to PCI in 2016. His cardiac regimen is limited baseline, essentially on Plavix. No other antianginals.   Past Medical History:  Diagnosis Date  . Anemia   . CAD (coronary artery disease)    a. 2008 PCI: Taxus DES  to mRCA, prox & mid LCFX;  b. 10/2014 MV: extensive ischemia in the RCA territory and small area of anterior ischemia;  c. 11/2014 PCI: LM nl, LAD 30ost/prox, 80d, LCX 90ost (3.0x12 Promus Prem DES), OM2 30, RCA 52m (3.0x12 Promus Prem DES), 58m, RPDA 90 (PTCA), EF 55-65%.  . Cancer ([place])    Head & neck  . Diverticulosis of colon   . History of colon polyps    2014--  hyperplastic, adenoma, and benign  polyps  . History of radiation therapy 10/01/15- 11/20/15   Oropharynx/base of neck 70 Gy in 35 fractions  . Hyperlipidemia   . Hypertension   . Myocardial infarction ([place])   . Nonproliferative diabetic retinopathy ([place])   . Obesity   . Peripheral neuropathy   . Pneumonia    hx  . Prostate cancer ([place])   . RBBB (right bundle branch block)   . Skin cancer   . Type 2 diabetes mellitus ([place])     Past Surgical History:  Procedure Laterality Date  . APPENDECTOMY    . CARDIAC CATHETERIZATION N/A 11/13/2014   Procedure: Left Heart Cath and Coronary Angiography;  Surgeon: [name] [name], MD;  Location: [place] [facility];  Service: Cardiovascular;  Laterality: N/A;  . CARDIAC CATHETERIZATION  11/13/2014   Procedure: Coronary Stent Intervention;  Surgeon: [name] [name], MD;  Location: [place] [facility];  Service: Cardiovascular;;  . CARDIOVASCULAR STRESS TEST  01-17-2012  dr [name]   Normal perfusion study/ no ischemia or infarction/  normal LVF and wall motion, ef 54%  . CORONARY ANGIOPLASTY WITH STENT PLACEMENT  05/04/2006   dr [name]   Severe 2-vessel obstructive CAD/  normal LVF/  PCI with DES to proxima and mid CFX and mRCA (total 3 DES)  . CRYOABLATION N/A 04/01/2014   Procedure: CRYO ABLATION PROSTATE;  Surgeon: [name], MD;  Location: [facility];  Service: Urology;  Laterality: N/A;  . DIRECT LARYNGOSCOPY N/A 09/19/2015   Procedure: DIRECT LARYNGOSCOPY;  Surgeon: Izora Gala, MD;  Location: [place];  Service: ENT;  Laterality: N/A;  . ESOPHAGOSCOPY N/A 09/19/2015   Procedure: ESOPHAGOSCOPY;  Surgeon: Izora Gala, MD;  Location: [place];  Service: ENT;  Laterality: N/A;  . FLOOR OF MOUTH BIOPSY N/A 09/19/2015   Procedure: FLOOR OF MOUTH BIOPSY;  Surgeon: Izora Gala, MD;  Location: [facility];  Service: ENT;  Laterality: N/A;  . INSERTION OF SUPRAPUBIC CATHETER N/A 04/01/2014   Procedure: INSERTION OF SUPRAPUBIC CATHETER;  Surgeon: [name], MD;  Location:  [facility];  Service: Urology;  Laterality: N/A;  suprapubic   . IR GASTROSTOMY TUBE REMOVAL  11/17/2016  . IR GENERIC HISTORICAL  11/13/2015   IR GASTROSTOMY TUBE MOD SED 11/13/2015 [name], DO [facility]  . IR GENERIC HISTORICAL  03/26/2016   IR US GUIDE VASC ACCESS RIGHT 03/26/2016 [name], MD [facility]  . IR GENERIC HISTORICAL  03/26/2016   IR FLUORO GUIDE PORT INSERTION RIGHT 03/26/2016 [name], MD [facility]  . MULTIPLE TOOTH EXTRACTIONS  09/17/2015  . PARTIAL COLECTOMY  80's     Inpatient Medications: Scheduled Meds: . chlorhexidine  15 mL Mouth Rinse BID  . insulin aspart  0-15 Units Subcutaneous TID WC  . mouth rinse  15 mL Mouth Rinse q12n4p  . mirtazapine  30 mg Oral QHS  . pantoprazole (PROTONIX) IV  40 mg Intravenous Q24H  . polyethylene glycol  17 g Oral Daily   Continuous Infusions: . sodium chloride 125 mL/hr at 01/23/17 0446   PRN Meds: acetaminophen, ondansetron (ZOFRAN) IV  Allergies:    Allergies  Allergen Reactions  . Cetuximab Anaphylaxis and Other (See Comments)    Cardiopulmonary arrest    Social History:   Social History   Socioeconomic History  . Marital status: Married    Spouse name: Benjamin Barr  . Number of children: 2  . Years of education: Not on file  . Highest education level: Not on file  Social Needs  . Financial resource strain: Not on file  . Food insecurity - worry: Not on file  . Food insecurity - inability: Not on file  . Transportation needs - medical: Not on file  . Transportation needs - non-medical: Not on file  Occupational History  . Occupation: Press photographer    Comment: retired  Tobacco Use  . Smoking status: Never Smoker  . Smokeless tobacco: Never Used  Substance and Sexual Activity  . Alcohol use: Yes    Comment: occ beer  . Drug use: No  . Sexual activity: Not on file  Other Topics Concern  . Not on file  Social History Narrative  . Not on file    Family History:   The  patient's family history includes Cancer in his father; Diverticulitis in his mother; Heart failure in his father.  ROS:  Please see the history of present illness.  Weakness, decreased appetite, intermittent abdominal pain. All other ROS reviewed and negative.     Physical Exam/Data:   Vitals:   01/22/17 1746 01/22/17 2111 01/22/17 2238 01/23/17 0447  BP: (!) 120/59 (!) 143/56  (!) 167/85  Pulse: (!) 107 100  98  Resp: (!) 22 (!) 22  18  Temp: (!) 101.8 F (38.8 C) (!) 101.6 F (38.7 C) 98.4 F (36.9 C) 99.5 F (37.5 C)  TempSrc: Oral Oral Oral Oral  SpO2: 99% 98%  98%  Weight:      Height:        Intake/Output Summary (  Last 24 hours) at 01/23/2017 1013 Last data filed at 01/23/2017 0600 Gross per 24 hour  Intake 2600 ml  Output 350 ml  Net 2250 ml   Filed Weights   01/21/17 0052 01/22/17 0420  Weight: 171 lb (77.6 kg) 175 lb 11.3 oz (79.7 kg)   Body mass index is 26.72 kg/m.   Gen: Chronically ill-appearing male, no distress. HEENT: Conjunctiva and lids normal, oropharynx clear. Neck: Supple, no elevated JVP or carotid bruits, no thyromegaly. Lungs: No wheezing, nonlabored breathing at rest. Cardiac: Regular rate and rhythm with frequent ectopy, no S3, soft systolic murmur, no pericardial rub. Abdomen: Soft, mildly tender, bowel sounds present, no guarding or rebound. Extremities: No pitting edema, distal pulses 2+. Skin: Warm and dry. Musculoskeletal: No kyphosis. Neuropsychiatric: Alert and oriented x3, affect grossly appropriate.  EKG:  I personally reviewed the tracing from 01/21/2017 which showed sinus tachycardia with right bundle branch block, probable old inferior infarct pattern with residual ST segment abnormalities that are more prominent compared to prior tracing earlier in the year.  Telemetry:  I personally reviewed telemetry which shows sinus rhythm with frequent PVCs.  Relevant CV Studies:  Cardiac catheterization and PCI 11/13/2014:  Ost LAD  to Prox LAD lesion, 30% stenosed.  Dist LAD lesion, 80% stenosed.  2nd Mrg lesion, 30% stenosed.  Ost Cx lesion, 90% stenosed.  The left ventricular systolic function is normal.  Ost Cx to Prox Cx lesion, 90% stenosed. This is proximal the the prior stent. There is a 0% residual stenosis post intervention.  RPDA lesion, 90% stenosed. There is a 10% residual stenosis post intervention.  Mid RCA-1 lesion, 90% stenosed. There is a 0% residual stenosis post intervention. A drug-eluting stent was placed. This is proximal to the prior stent.  Mid RCA-1 lesion, 90% stenosed.  A drug-eluting stent was placed.  A drug-eluting stent was placed.   1. Severe 2 vessel obstructive CAD     90% ostial LCx. Prior stent in the proximal to mid LCx is patent.     90% mid RCA proximal to prior stent. The old stent is patent     90% PDA 2. Normal LV function 3. Successful stenting of the ostial LCx with a DES 4. Successful stenting of the mid RCA with a DES  5. Successful POBA of the PDA.  Laboratory Data:  Chemistry Recent Labs  Lab 01/21/17 1500 01/22/17 0601 01/23/17 0516  NA 138 139 142  K 3.7 4.0 3.4*  CL 108 107 109  CO2 23 22 23   GLUCOSE 119* 111* 144*  BUN 20 30* 33*  CREATININE 1.21 2.10* 2.01*  CALCIUM 7.8* 8.3* 8.5*  GFRNONAA 58* 30* 31*  GFRAA >60 34* 36*  ANIONGAP 7 10 10     Recent Labs  Lab 01/21/17 1500 01/22/17 0601 01/23/17 0516  PROT 5.1* 5.5* 6.1*  ALBUMIN 2.3* 2.3* 2.5*  AST 109* 61* 51*  ALT 104* 83* 69*  ALKPHOS 249* 239* 234*  BILITOT 4.3* 4.1* 2.5*   Hematology Recent Labs  Lab 01/21/17 0129 01/22/17 0601 01/23/17 0516  WBC 7.7 6.5 5.0  RBC 4.58 4.09* 4.51  HGB 13.8 12.1* 13.7  HCT 40.9 36.6* 40.6  MCV 89.3 89.5 90.0  MCH 30.1 29.6 30.4  MCHC 33.7 33.1 33.7  RDW 14.0 14.1 14.5  PLT 135* 104* 91*   Cardiac Enzymes Recent Labs  Lab 01/21/17 0129 01/21/17 0917 01/21/17 1500 01/22/17 0601  TROPONINI 0.13* 0.60* 0.68* 0.69*   No  results for input(s): TROPIPOC  in the last 168 hours.   Radiology/Studies:  Ct Abdomen Pelvis Wo Contrast  Result Date: 01/21/2017 CLINICAL DATA:  Abdominal distension. Head neck cancer with liver metastasis. On chemotherapy. EXAM: CT ABDOMEN AND PELVIS WITHOUT CONTRAST TECHNIQUE: Multidetector CT imaging of the abdomen and pelvis was performed following the standard protocol without IV contrast. COMPARISON:  12/09/2016 PET. FINDINGS: Lower chest: Right greater than left base atelectasis. Normal heart size with multivessel coronary artery atherosclerosis. Trace right pleural fluid is new. Hepatobiliary: Minimal exclusion of the hepatic dome. Subtle low-density caudate lobe lesion is felt to be similar, on the order of 3.2 x 2.4 cm on image 12/series 2. Small gallstones. Mild gallbladder distension. Subtle pericholecystic edema, including image 21/series 2. No biliary duct dilatation. Pancreas: Mild pancreatic atrophy, without duct dilatation or dominant mass. Spleen: Normal in size, without focal abnormality. Adrenals/Urinary Tract: Normal adrenal glands. Punctate left renal collecting system calculi, most apparent on coronal reformats. Low-density right renal lesions are likely cysts, including at 5.0 cm. No hydroureter or ureteric calculi. No bladder calculi. trace air within the nondependent bladder, including on image 75/series 2. Stomach/Bowel: Normal stomach, without wall thickening. Scattered colonic diverticula. Normal terminal ileum. Normal small bowel. Vascular/Lymphatic: Aortic and branch vessel atherosclerosis. 1.3 cm portal caval node is similar to on the prior. No pelvic sidewall adenopathy. Reproductive: Mild prostatomegaly. Other: No significant free fluid.  No free intraperitoneal air. Musculoskeletal:Right iliac bone island.  Thoracolumbar spondylosis IMPRESSION: 1. Cholelithiasis with gallbladder distension and subtle pericholecystic edema. Findings suspicious for acute cholecystitis. Right  upper quadrant ultrasound could confirm. 2. Similar caudate lobe metastasis. Similar borderline porta hepatis adenopathy. 3. Left nephrolithiasis. 4. New tiny right pleural effusion with bibasilar atelectasis. 5. Trace air in the nondependent urinary bladder. Correlate with instrumentation. 6. Coronary artery atherosclerosis. Aortic Atherosclerosis (ICD10-I70.0). Electronically Signed   By: Abigail Miyamoto M.D.   On: 01/21/2017 10:19   Dg Chest 2 View  Result Date: 01/21/2017 CLINICAL DATA:  Fever, weakness and cough for 24 hours. EXAM: CHEST  2 VIEW COMPARISON:  04/12/2016 FINDINGS: Right jugular port extends to the right atrium.The lungs are clear. No pleural effusion. Normal pulmonary vasculature. Hilar, mediastinal and cardiac contours are unremarkable unchanged. IMPRESSION: No acute cardiopulmonary findings. Electronically Signed   By: Andreas Newport M.D.   On: 01/21/2017 02:52   US Abdomen Limited Ruq  Result Date: 01/21/2017 CLINICAL DATA:  Abnormal CT.  Abdominal distension. EXAM: ULTRASOUND ABDOMEN LIMITED RIGHT UPPER QUADRANT COMPARISON:  Abdominal CT earlier this day. FINDINGS: Gallbladder: Distended with wall thickening measuring up to 5-6 mm. Multiple shadowing intraluminal calculi. No frank pericholecystic fluid. No sonographic Murphy sign noted by sonographer. Common bile duct: Diameter: 3 mm. Liver: Central hypodense caudate lesion measures approximately 3.8 x 2.6 x 3.3 cm. No additional focal lesion. Otherwise within normal limits in parenchymal echogenicity. Portal vein is patent on color Doppler imaging with normal direction of blood flow towards the liver. IMPRESSION: 1. Distended gallbladder with gallstones and gallbladder wall thickening. Sonographic findings are concerning for acute cholecystitis, and corroborate CT findings. However sonographic Percell Miller sign is negative, and gallbladder wall thickening may be secondary to systemic causes. Nuclear medicine HIDA scan could be considered  for further evaluation based on clinical concern. 2. Hypodense metastasis in the caudate measures approximately 3.3 cm, better characterized on cross-sectional imaging. Electronically Signed   By: Jeb Levering M.D.   On: 01/21/2017 21:44    Assessment and Plan:   1. Abnormal troponin I levels, suspect demand ischemia at this  point although he did have some angina symptoms recently by report.  2. CAD status post DES to the RCA and circumflex in 2008, with DES interventions and angioplasty to the same vessels in 2016. He is on long-term Plavix as an outpatient, otherwise no regular antianginals.  3. Sepsis due to Klebsiella bacteremia and cholecystitis.  4. Acute renal insufficiency, creatinine 2.0 at this time.  5. History of ENT cancer with liver metastasis. He is status post XRT and chemotherapy.  I reviewed the chart and discussed the situation with patient and wife. Echocardiogram is being obtained today. Will also update ECG. If there are no plans for further invasive testing or endoscopy, would consider resuming Plavix. Also starting Lopressor 12.5 mg twice daily. Hopefully he can be managed medically from a cardiac perspective, now would be a suboptimal time to pursue cardiac catheterization in light of renal insufficiency and bacteremia.   Signed, Rozann Lesches, MD  01/23/2017 10:13 AM

## 2017-01-24 ENCOUNTER — Inpatient Hospital Stay (HOSPITAL_COMMUNITY): Payer: Medicare Other

## 2017-01-24 ENCOUNTER — Ambulatory Visit: Payer: Medicare Other

## 2017-01-24 ENCOUNTER — Other Ambulatory Visit: Payer: Medicare Other

## 2017-01-24 DIAGNOSIS — Z9861 Coronary angioplasty status: Secondary | ICD-10-CM

## 2017-01-24 DIAGNOSIS — R748 Abnormal levels of other serum enzymes: Secondary | ICD-10-CM

## 2017-01-24 DIAGNOSIS — I2510 Atherosclerotic heart disease of native coronary artery without angina pectoris: Secondary | ICD-10-CM

## 2017-01-24 LAB — CULTURE, BLOOD (ROUTINE X 2)
SPECIAL REQUESTS: ADEQUATE
Special Requests: ADEQUATE

## 2017-01-24 LAB — COMPREHENSIVE METABOLIC PANEL
ALBUMIN: 2 g/dL — AB (ref 3.5–5.0)
ALT: 48 U/L (ref 17–63)
ANION GAP: 9 (ref 5–15)
AST: 45 U/L — AB (ref 15–41)
Alkaline Phosphatase: 228 U/L — ABNORMAL HIGH (ref 38–126)
BILIRUBIN TOTAL: 1.2 mg/dL (ref 0.3–1.2)
BUN: 27 mg/dL — ABNORMAL HIGH (ref 6–20)
CHLORIDE: 108 mmol/L (ref 101–111)
CO2: 22 mmol/L (ref 22–32)
Calcium: 7.8 mg/dL — ABNORMAL LOW (ref 8.9–10.3)
Creatinine, Ser: 1.36 mg/dL — ABNORMAL HIGH (ref 0.61–1.24)
GFR calc Af Amer: 58 mL/min — ABNORMAL LOW (ref >60)
GFR, EST NON AFRICAN AMERICAN: 50 mL/min — AB (ref >60)
Glucose, Bld: 109 mg/dL — ABNORMAL HIGH (ref 65–99)
POTASSIUM: 2.9 mmol/L — AB (ref 3.5–5.1)
Sodium: 139 mmol/L (ref 135–145)
TOTAL PROTEIN: 5.1 g/dL — AB (ref 6.5–8.1)

## 2017-01-24 LAB — CBC
HEMATOCRIT: 33.7 % — AB (ref 39.0–52.0)
HEMOGLOBIN: 11.1 g/dL — AB (ref 13.0–17.0)
MCH: 29.2 pg (ref 26.0–34.0)
MCHC: 32.9 g/dL (ref 30.0–36.0)
MCV: 88.7 fL (ref 78.0–100.0)
Platelets: 79 10*3/uL — ABNORMAL LOW (ref 150–400)
RBC: 3.8 MIL/uL — ABNORMAL LOW (ref 4.22–5.81)
RDW: 14.4 % (ref 11.5–15.5)
WBC: 4.1 10*3/uL (ref 4.0–10.5)

## 2017-01-24 LAB — GLUCOSE, CAPILLARY
GLUCOSE-CAPILLARY: 100 mg/dL — AB (ref 65–99)
GLUCOSE-CAPILLARY: 102 mg/dL — AB (ref 65–99)
Glucose-Capillary: 108 mg/dL — ABNORMAL HIGH (ref 65–99)
Glucose-Capillary: 104 mg/dL — ABNORMAL HIGH (ref 65–99)

## 2017-01-24 LAB — FIBRINOGEN: FIBRINOGEN: 743 mg/dL — AB (ref 210–475)

## 2017-01-24 LAB — PROTIME-INR
INR: 1.18
PROTHROMBIN TIME: 14.9 s (ref 11.4–15.2)

## 2017-01-24 LAB — MAGNESIUM: MAGNESIUM: 1.5 mg/dL — AB (ref 1.7–2.4)

## 2017-01-24 LAB — TECHNOLOGIST SMEAR REVIEW

## 2017-01-24 MED ORDER — POTASSIUM CHLORIDE CRYS ER 20 MEQ PO TBCR
40.0000 meq | EXTENDED_RELEASE_TABLET | Freq: Once | ORAL | Status: DC
  Filled 2017-01-24: qty 2

## 2017-01-24 MED ORDER — SODIUM CHLORIDE 0.9% FLUSH: 10.0000 mL | INTRAVENOUS | Status: DC | PRN

## 2017-01-24 MED ORDER — SODIUM CHLORIDE 0.9 % IV SOLN
3.0000 g | Freq: Four times a day (QID) | INTRAVENOUS | Status: DC
  Administered 2017-01-24 – 2017-01-25 (×6): 3 g via INTRAVENOUS
  Filled 2017-01-24 (×7): qty 3

## 2017-01-24 MED ORDER — MAGNESIUM SULFATE 2 GM/50ML IV SOLN
2.0000 g | Freq: Once | INTRAVENOUS | Status: AC
  Administered 2017-01-24: 2 g via INTRAVENOUS
  Filled 2017-01-24: qty 50

## 2017-01-24 MED ORDER — BOOST PLUS PO LIQD
237.0000 mL | ORAL | Status: DC
  Administered 2017-01-25: 237 mL via ORAL
  Filled 2017-01-24: qty 237

## 2017-01-24 MED ORDER — TECHNETIUM TC 99M MEBROFENIN IV KIT
5.3000 | PACK | Freq: Once | INTRAVENOUS | Status: AC | PRN
  Administered 2017-01-24: 5.3 via INTRAVENOUS

## 2017-01-24 MED ORDER — ADULT MULTIVITAMIN W/MINERALS CH
1.0000 | ORAL_TABLET | Freq: Every day | ORAL | Status: DC
  Administered 2017-01-24 – 2017-01-25 (×2): 1 via ORAL
  Filled 2017-01-24 (×2): qty 1

## 2017-01-24 MED ORDER — POTASSIUM CHLORIDE 10 MEQ/100ML IV SOLN
10.0000 meq | INTRAVENOUS | Status: AC
  Administered 2017-01-24 (×3): 10 meq via INTRAVENOUS
  Filled 2017-01-24 (×3): qty 100

## 2017-01-24 MED ORDER — MORPHINE SULFATE (PF) 4 MG/ML IV SOLN
INTRAVENOUS | Status: AC
  Filled 2017-01-24: qty 1

## 2017-01-24 NOTE — Progress Notes (Addendum)
Progress Note  Patient Name: Benjamin Barr Date of Encounter: 01/24/2017  Primary Cardiologist: Dr. [name]  Subjective   He denies any chest pain or SOB.  2D echo with overall normal LVF with ? Basal inferior AK and inferolateral HK.    Inpatient Medications    Scheduled Meds: . chlorhexidine  15 mL Mouth Rinse BID  . insulin aspart  0-15 Units Subcutaneous TID WC  . mouth rinse  15 mL Mouth Rinse q12n4p  . metoprolol tartrate  12.5 mg Oral BID  . mirtazapine  30 mg Oral QHS  . pantoprazole (PROTONIX) IV  40 mg Intravenous Q24H  . polyethylene glycol  17 g Oral Daily  . potassium chloride  40 mEq Oral Once   Continuous Infusions: . sodium chloride 100 mL/hr at 01/24/17 0858  . ampicillin-sulbactam (UNASYN) IV    . magnesium sulfate 1 - 4 g bolus IVPB    . potassium chloride     PRN Meds: acetaminophen, ondansetron (ZOFRAN) IV, sodium chloride flush   Vital Signs    Vitals:   01/23/17 1347 01/23/17 1428 01/23/17 2101 01/24/17 0623  BP: (!) 154/74 (!) 109/59 119/73 134/71  Pulse: 91 (!) 103 86 81  Resp: 16 18 18 18   Temp: 99.7 F (37.6 C) 99.5 F (37.5 C) 99.4 F (37.4 C) 99.6 F (37.6 C)  TempSrc: Oral Oral Oral Oral  SpO2: 99% 94% 100% 96%  Weight:      Height:        Intake/Output Summary (Last 24 hours) at 01/24/2017 0925 Last data filed at 01/23/2017 1847 Gross per 24 hour  Intake 1061.67 ml  Output 200 ml  Net 861.67 ml   Filed Weights   01/21/17 0052 01/22/17 0420  Weight: 171 lb (77.6 kg) 175 lb 11.3 oz (79.7 kg)    Telemetry    NSR - Personally Reviewed  ECG    No new EKG to review - Personally Reviewed  Physical Exam   GEN: No acute distress.   Neck: No JVD Cardiac: RRR, no murmurs, rubs, or gallops.  Respiratory: Clear to auscultation bilaterally. GI: Soft, nontender, non-distended  MS: No edema; No deformity. Neuro:  Nonfocal  Psych: Normal affect   Labs    Chemistry Recent Labs  Lab 01/22/17 0601 01/23/17 0516  01/24/17 0543  NA 139 142 139  K 4.0 3.4* 2.9*  CL 107 109 108  CO2 22 23 22   GLUCOSE 111* 144* 109*  BUN 30* 33* 27*  CREATININE 2.10* 2.01* 1.36*  CALCIUM 8.3* 8.5* 7.8*  PROT 5.5* 6.1* 5.1*  ALBUMIN 2.3* 2.5* 2.0*  AST 61* 51* 45*  ALT 83* 69* 48  ALKPHOS 239* 234* 228*  BILITOT 4.1* 2.5* 1.2  GFRNONAA 30* 31* 50*  GFRAA 34* 36* 58*  ANIONGAP 10 10 9      Hematology Recent Labs  Lab 01/22/17 0601 01/23/17 0516 01/24/17 0543  WBC 6.5 5.0 4.1  RBC 4.09* 4.51 3.80*  HGB 12.1* 13.7 11.1*  HCT 36.6* 40.6 33.7*  MCV 89.5 90.0 88.7  MCH 29.6 30.4 29.2  MCHC 33.1 33.7 32.9  RDW 14.1 14.5 14.4  PLT 104* 91* 79*    Cardiac Enzymes Recent Labs  Lab 01/21/17 0129 01/21/17 0917 01/21/17 1500 01/22/17 0601  TROPONINI 0.13* 0.60* 0.68* 0.69*   No results for input(s): TROPIPOC in the last 168 hours.   BNPNo results for input(s): BNP, PROBNP in the last 168 hours.   DDimer No results for input(s): DDIMER in  the last 168 hours.   Radiology    No results found.  Cardiac Studies  2D echo 01/2017 Study Conclusions  - Left ventricle: The cavity size was normal. Wall thickness was   normal. Basal inferior akinesis, basal inferolateral hypokinesis.   The estimated ejection fraction was 55%. Doppler parameters are   consistent with abnormal left ventricular relaxation (grade 1   diastolic dysfunction). - Aortic valve: Trileaflet; moderately calcified leaflets. There   was no stenosis. - Mitral valve: There was mild regurgitation. - Left atrium: The atrium was mildly dilated. - Right ventricle: The cavity size was normal. Systolic function   was normal. - Pulmonary arteries: No complete TR doppler jet so unable to   estimate PA systolic pressure. - Inferior vena cava: The vessel was normal in size. The   respirophasic diameter changes were in the normal range (>= 50%),   consistent with normal central venous pressure.  Impressions:  - Normal LV size with EF  55%. Basal inferior akinesis, basal   inferolateral severe hypokinesis. Normal RV size and systolic   function. Mild mitral regurgitation.   Patient Profile     71 y.o. male with a history of ENT cancer with liver metastasis status post treatment with XRT and chemotherapy, prostate cancer, hypertension, hyperlipidemia, type 2 diabetes mellitus, hypertension, and CAD status post DES interventions to the RCA and circumflex, most recently 2016 who is being seen  for the evaluation of abnormal troponin I level.  Assessment & Plan    1. Abnormal troponin I (0.13>0.6>0.68>0.69) with flat trend and therefore suspect demand ischemia in setting of bacteremia and cholecystitis.  He has never had angina but prior to last stent he had what he describes as a brief "fluttering" in his chest and had a similar feeling once last week - 2D echo showed overall normal LVF with basal inferior AK and severe basal inferolateral HK. Last echo in 05/2014 did not mention this but his cath showed inferior HK in 12/2014.   - agree with Dr. Domenic Polite that in setting of bacteremia and acute on CKD, this is not a good time to pursue cath.  Would manage medically.  I do not think that the trop elevation is due to ACS with flat trend.   - restart Plavix when ok with surgery - continue BB for antianginal therapy.   - if it is decided that surgical removal of gallbladder is indicated this admission then would recommend nuclear stress to define ischemia prior to surgery.   2. CAD status post DES to the RCA and circumflex in 2008, with DES interventions and angioplasty to the same vessels in 2016.  - Plavix currently on hold - restart when ok with surgery  3. Sepsis due to Klebsiella bacteremia and cholecystitis. - workup and treatment plan per surgery and TRH  4. Acute renal insufficiency - creatinine bumped as high as 2.1 - today SCr down to 1.36.  5. History of ENT cancer with liver metastasis. He is status post XRT and  chemotherapy.   Would prefer to try medical therapy in setting of acute bacteremia.  His cath in 11/2014 post PCI of the RCA had a residual distal 80% LAD and he has not been on any antianginal therapy at home. Echo 6 months prior to cath 11/2014 did not show any inferior wall motion abnormality but cath 11/2014 at time of PCI of the RCA showed inferior HK.  Echo this admit shows basal inferior AK. He has inferior Q waves which  are old from 03/2016.  His chest discomfort was vague on admission and could be related to his GB. If he has any further anginal symptoms once GB issues and bacteremia have resolved then could consider nuclear stress test to see if any ischemia in the inferior wall that would indicate restenosis of the RCA stent.  The distal LAD should be treated with medical Rx.  BB has been started.  Await final recs from surgery.  IF no Surgery planned then would restart Plavix and followup outpt.  If surgery recommended then would consider nuclear stress prior to surgery. Will follow at a distance.     For questions or updates, please contact [place] Please consult [email or web address] for contact info under Cardiology/STEMI.      Signed, Fransico Him, MD  01/24/2017, 9:25 AM

## 2017-01-24 NOTE — Progress Notes (Signed)
CC:  Generalized weakness with possible cholecysitis  Subjective: He is feeling better and wants to know what is next.    Objective: Vital signs in last 24 hours: Temp:  [99.4 F (37.4 C)-99.7 F (37.6 C)] 99.6 F (37.6 C) (12/17 0623) Pulse Rate:  [81-103] 81 (12/17 0623) Resp:  [16-18] 18 (12/17 0623) BP: (109-154)/(59-74) 134/71 (12/17 0623) SpO2:  [94 %-100 %] 96 % (12/17 0623) Last BM Date: (one week ago per pt) 600 PO 700 IV 200 urine recorded Afebrile, VSS LFT's improving Creatinine improved  HIDA shows slow filling of GB after Morphine MRCP:. Thin section MRCP images demonstrate multiple small gallstones. There is no biliary dilatation or evidence of choledocholithiasis. Mild gallbladder wall thickening is present as on recent CT. No focal surrounding inflammation identified. Similar appearance of known metastasis in the caudate lobe and adjacent porta hepatis lymph nodes.  Mild anasarca with bilateral pleural effusions,   Intake/Output from previous day: 12/16 0701 - 12/17 0700 In: 1301.7 [P.O.:600; I.V.:651.7; IV Piggyback:50] Out: 200 [Urine:200] Intake/Output this shift: Total I/O In: 500 [I.V.:300; IV Piggyback:200] Out: -   General appearance: alert, cooperative and no distress GI: soft, non-tender; bowel sounds normal; no masses,  no organomegaly  Lab Results:  Recent Labs    01/23/17 0516 01/24/17 0543  WBC 5.0 4.1  HGB 13.7 11.1*  HCT 40.6 33.7*  PLT 91* 79*    BMET Recent Labs    01/23/17 0516 01/24/17 0543  NA 142 139  K 3.4* 2.9*  CL 109 108  CO2 23 22  GLUCOSE 144* 109*  BUN 33* 27*  CREATININE 2.01* 1.36*  CALCIUM 8.5* 7.8*   PT/INR Recent Labs    01/24/17 0543  LABPROT 14.9  INR 1.18    Recent Labs  Lab 01/21/17 0129 01/21/17 1500 01/22/17 0601 01/23/17 0516 01/24/17 0543  AST 251* 109* 61* 51* 45*  ALT 148* 104* 83* 69* 48  ALKPHOS 363* 249* 239* 234* 228*  BILITOT 4.6* 4.3* 4.1* 2.5* 1.2  PROT 6.3*  5.1* 5.5* 6.1* 5.1*  ALBUMIN 3.0* 2.3* 2.3* 2.5* 2.0*     Lipase  No results found for: LIPASE   Medications: . chlorhexidine  15 mL Mouth Rinse BID  . insulin aspart  0-15 Units Subcutaneous TID WC  . mouth rinse  15 mL Mouth Rinse q12n4p  . metoprolol tartrate  12.5 mg Oral BID  . mirtazapine  30 mg Oral QHS  . morphine      . pantoprazole (PROTONIX) IV  40 mg Intravenous Q24H  . polyethylene glycol  17 g Oral Daily  . potassium chloride  40 mEq Oral Once   . sodium chloride 100 mL/hr at 01/24/17 0858  . ampicillin-sulbactam (UNASYN) IV 3 g (01/24/17 1218)  . magnesium sulfate 1 - 4 g bolus IVPB    . potassium chloride 10 mEq (01/24/17 1215)   Anti-infectives (From admission, onward)   Start     Dose/Rate Route Frequency Ordered Stop   01/24/17 1000  Ampicillin-Sulbactam (UNASYN) 3 g in sodium chloride 0.9 % 100 mL IVPB     3 g 200 mL/hr over 30 Minutes Intravenous Every 6 hours 01/24/17 0911     01/23/17 1800  cefTRIAXone (ROCEPHIN) 2 g in dextrose 5 % 50 mL IVPB  Status:  Discontinued     2 g 100 mL/hr over 30 Minutes Intravenous Every 24 hours 01/23/17 1041 01/24/17 0910   01/21/17 1600  piperacillin-tazobactam (ZOSYN) IVPB 3.375 g  Status:  Discontinued     3.375 g 12.5 mL/hr over 240 Minutes Intravenous Every 8 hours 01/21/17 1130 01/23/17 1007   01/21/17 1400  ceFEPIme (MAXIPIME) 1 g in dextrose 5 % 50 mL IVPB  Status:  Discontinued     1 g 100 mL/hr over 30 Minutes Intravenous Every 8 hours 01/21/17 1036 01/21/17 1130   01/21/17 1200  vancomycin (VANCOCIN) IVPB 1000 mg/200 mL premix  Status:  Discontinued     1,000 mg 200 mL/hr over 60 Minutes Intravenous Every 24 hours 01/21/17 1036 01/22/17 0906   01/21/17 0645  ceFEPIme (MAXIPIME) 2 g in dextrose 5 % 50 mL IVPB     2 g 100 mL/hr over 30 Minutes Intravenous  Once 01/21/17 0643 01/21/17 0741   01/21/17 0645  vancomycin (VANCOCIN) IVPB 1000 mg/200 mL premix     1,000 mg 200 mL/hr over 60 Minutes Intravenous  Once  01/21/17 7482 01/21/17 7078      Assessment/Plan Possible cholecystitis -  HIDA/MRCP Negative 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  Will review with Dr. Kae Heller.   LOS: 3 days    Lakeyia Surber 01/24/2017 [phone]

## 2017-01-24 NOTE — Progress Notes (Signed)
PT Cancellation Note  Patient Details Name: Benjamin Barr MRN: [id number] DOB: November 08, 1945   Cancelled Treatment:    Reason Eval/Treat Not Completed: Patient declined, no reason specified.  Pt was cleared by nursing to do therapy and then refused the visit, stating he had been for many tests today and is too tired to participate.  Agreed to see PT in the AM.   Ramond Dial 01/24/2017, 3:31 PM   Mee Hives, PT MS Acute Rehab Dept. Number: [place]

## 2017-01-24 NOTE — Progress Notes (Signed)
Initial Nutrition Assessment  DOCUMENTATION CODES:   (Will assess for malnutrition at follow-up)  INTERVENTION:  - Ongoing diet advancement as medically feasible. - Will order Will order Boost Plus once/day, this supplement provides 360 kcal and 14 grams of protein. - Encourage PO intakes throughout the day.  NUTRITION DIAGNOSIS:   Inadequate oral intake related to acute illness, decreased appetite as evidenced by per patient/family report.  GOAL:   Patient will meet greater than or equal to 90% of their needs  MONITOR:   PO intake, Supplement acceptance, Diet advancement, Weight trends, Labs  REASON FOR ASSESSMENT:   Malnutrition Screening Tool  ASSESSMENT:   71 year-old with PMH of tongue cancer, liver metastasis, chemotherapy, prostate cancer, HTN, HLD, type II DM, CAD, depression; admitted on 01/21/2017, presented with complaint of generalized weakness, was found to have sepsis due to Klebsiella bacteremia likely from cholecystitis.  BMI indicates overweight status. Pt states that he was out of the room all morning for testing and x-rays and that he is feeling very sleepy d/t this. Will attempt physical assessment at follow-up with respect to pt's comfort at this time; noted abdominal distention. Pt reports that he finished radiation in October 2017 but that he has had ongoing lack of taste. He had previously had feeding tube and was last seen by [facility] RD d/t this on 06/21/16. Pt reports that he was very excited to have the tube removed but that after removal, eating was like ";peddeling uphill and not getting anywhere." He indicates frustration d/t this but states that appetite was slowly returning and he had gotten to a point of feeling hungry and being interested in eating; despite this, he would quickly lose his appetite and be disinterested after even a bite or two.   He states that over the past 1 week he has been very weak and has absolutely no appetite. He is able to  tolerate water and light-colored sodas (such as diet ginger ale) without issue, but has an unbearable burning sensation with dark-colored sodas (such as Coke) and juices (such as orange juice). PTA, he was drinking Boost once/week and was tolerating this without issue. He states that previously he had not wanted anything cold to drink but in the past week he has only wanted cold beverages.   Dr. Posey Pronto reports plan to advance diet from CLD to FLD at this time and that if this is well tolerated, pt can advance to Soft diet for dinner. Encouraged pt to have at least bites and sips, even if it takes a prolonged time, at each meal to help prevent nausea and to keep digestive system "used to working." Encouraged soft, easy to chew foods that will not further deplete energy stores from chewing. Gave pt examples of items from the menu.   Per chart review, pt has lost 10 lbs (5.4% body weight) in the past 4 months. This is not significant for time frame, but will need to assess if this weight loss has occurred more acutely. Noted that plan was for HIDA and MRCP for today.   Medications reviewed; sliding scale Novolog, 2 g IV Mg sulfate x1 run today, 40 mg IV Protonix/day, 1 packet Miralax/day, 40 mEq oral KCl x1 dose today.  Labs reviewed; CBGs: 100 and 102 mg/dL today, K: 2.9 mmol/L, BUN: 27 mg/dL, creatinine: 1.36 mg/dL, Ca: 7.8 mg/dL, Mg: 1.5 mg/dL, Alk Phos and AST elevated, GFR: 50 mL/min.  IVF: NS @ 100 mL/hr.     NUTRITION - FOCUSED PHYSICAL EXAM:  Pt requests this not be done at this time d/t drowsiness and recently returning to his room from testing  Diet Order:  Diet clear liquid Room service appropriate? Yes; Fluid consistency: Thin  EDUCATION NEEDS:   Not appropriate for education at this time  Skin:  Skin Assessment: Reviewed RN Assessment  Last BM:  12/15  Height:   Ht Readings from Last 1 Encounters:  01/21/17 5' 8"  (1.727 m)    Weight:   Wt Readings from Last 1 Encounters:   01/22/17 175 lb 11.3 oz (79.7 kg)    Ideal Body Weight:  70 kg  BMI:  Body mass index is 26.72 kg/m.  Estimated Nutritional Needs:   Kcal:  1990-2230 (25-28 kcal/kg)  Protein:  90-100 grams  Fluid:  >/= 2 L/day     Benjamin Matin, MS, RD, LDN, [facility] Inpatient Clinical Dietitian Pager # [phone] After hours/weekend pager # [phone]

## 2017-01-24 NOTE — Progress Notes (Signed)
Benjamin Barr   DOB:June 08, 1945   XL#:[id number]    Subjective: Patient is improving and has been transfer out of ICU.  He felt that he wasted time over the weekend with nothing happening.  He denies pain, nausea or chest discomfort  Objective:  Vitals:   01/23/17 2101 01/24/17 0623  BP: 119/73 134/71  Pulse: 86 81  Resp: 18 18  Temp: 99.4 F (37.4 C) 99.6 F (37.6 C)  SpO2: 100% 96%     Intake/Output Summary (Last 24 hours) at 01/24/2017 1342 Last data filed at 01/24/2017 1300 Gross per 24 hour  Intake 1711.67 ml  Output 200 ml  Net 1511.67 ml    GENERAL:alert, no distress and comfortable SKIN: skin color, texture, turgor are normal, no rashes or significant lesions EYES: normal, Conjunctiva are pink and non-injected, sclera clear Musculoskeletal:no cyanosis of digits and no clubbing  NEURO: alert & oriented x 3 with fluent speech, no focal motor/sensory deficits   Labs:  Lab Results  Component Value Date   WBC 4.1 01/24/2017   HGB 11.1 (L) 01/24/2017   HCT 33.7 (L) 01/24/2017   MCV 88.7 01/24/2017   PLT 79 (L) 01/24/2017   NEUTROABS 4.6 01/23/2017    Lab Results  Component Value Date   NA 139 01/24/2017   K 2.9 (L) 01/24/2017   CL 108 01/24/2017   CO2 22 01/24/2017    Studies:  Nm Hepatobiliary Liver Func  Result Date: 01/24/2017 CLINICAL DATA:  Gallstones in gallbladder wall thickening. Generalized weakness. EXAM: NUCLEAR MEDICINE HEPATOBILIARY IMAGING TECHNIQUE: Sequential images of the abdomen were obtained out to 60 minutes following intravenous administration of radiopharmaceutical. RADIOPHARMACEUTICALS:  5.3 mCi Tc-17m  Choletec IV 3.18 mg morphine COMPARISON:  01/21/2017 ultrasound FINDINGS: Satisfactory uptake of radiopharmaceutical from the blood pool. Bowel activity visible at 12 minutes. There is some initial non filling of the gallbladder over 60 minutes. With IV morphine administration there is slow filling of the gallbladder ovaries subsequent 30  minutes. , indicating cystic duct patency. IMPRESSION: 1. Initial non filling of the gallbladder but after morphine administration the gallbladder is observed to slowly fill, indicating patency of the cystic duct. Radiopharmaceutical does extend into the bowel as well, indicating patency of the common bile duct. Electronically Signed   By: Van Clines M.D.   On: 01/24/2017 11:18   Mr Abdomen Mrcp Wo Contrast  Result Date: 01/24/2017 CLINICAL DATA:  Hepatic metastatic disease. History of tongue and prostate cancer. EXAM: MRI ABDOMEN WITHOUT CONTRAST  (INCLUDING MRCP) TECHNIQUE: Multiplanar multisequence MR imaging of the abdomen was performed. Heavily T2-weighted images of the biliary and pancreatic ducts were obtained, and three-dimensional MRCP images were rendered by post processing. COMPARISON:  PET-CT 12/09/2016. abdominopelvic CT and abdominal ultrasound 01/21/2017. FINDINGS: Lower chest: Small right-greater-than-left pleural effusions with mild dependent atelectasis at both lung bases. Hepatobiliary: There is a lesion in the caudate lobe which demonstrates low T1 signal, increased T2 signal and restricted diffusion. This corresponds with the hypermetabolic lesion on prior PET-CT and measures up to 2.8 cm on image 46 of series 8. I believe there are both intrahepatic and adjacent extrahepatic nodal components. No other hepatic lesions are identified. Thin section MRCP images demonstrate multiple small gallstones. There is no biliary dilatation or evidence of choledocholithiasis. Mild gallbladder wall thickening is present as on recent CT. No focal surrounding inflammation identified. Pancreas: The pancreas is atrophied. There is a 7 mm cyst in the pancreatic tail which is unlikely to be clinically significant. No evidence of  pancreatic ductal dilatation. Spleen: Normal in size without focal abnormality. Adrenals/Urinary Tract: Both adrenal glands appear normal. Right renal cysts, measuring up to 5.7  cm in the posterior interpolar region. There are tiny T1 hyperintense lesions in both kidneys. No worrisome renal findings or hydronephrosis. Stomach/Bowel: No evidence of bowel wall thickening, distention or surrounding inflammatory change.Mild sigmoid diverticulosis. Vascular/Lymphatic: Stable lymph nodes in the porta hepatis and gastrohepatic ligament with associated restricted diffusion. These were mildly hypermetabolic on PET-CT. No significant vascular findings are present. Other: Mesenteric, retroperitoneal and subcutaneous edema. No focal fluid collections. Musculoskeletal: No acute or significant osseous findings. IMPRESSION: 1. Cholelithiasis with nonspecific gallbladder wall thickening as seen on recent previous studies. There is no focal pericholecystic inflammation allowing for generalized soft tissue edema. 2. No biliary dilatation or choledocholithiasis. 3. Similar appearance of known metastasis in the caudate lobe and adjacent porta hepatis lymph nodes. 4. Suggested mild anasarca with bilateral pleural effusions, mesenteric, retroperitoneal and subcutaneous edema. Electronically Signed   By: Richardean Sale M.D.   On: 01/24/2017 12:17   Mr 3d Recon At Scanner  Result Date: 01/24/2017 CLINICAL DATA:  Hepatic metastatic disease. History of tongue and prostate cancer. EXAM: MRI ABDOMEN WITHOUT CONTRAST  (INCLUDING MRCP) TECHNIQUE: Multiplanar multisequence MR imaging of the abdomen was performed. Heavily T2-weighted images of the biliary and pancreatic ducts were obtained, and three-dimensional MRCP images were rendered by post processing. COMPARISON:  PET-CT 12/09/2016. abdominopelvic CT and abdominal ultrasound 01/21/2017. FINDINGS: Lower chest: Small right-greater-than-left pleural effusions with mild dependent atelectasis at both lung bases. Hepatobiliary: There is a lesion in the caudate lobe which demonstrates low T1 signal, increased T2 signal and restricted diffusion. This corresponds with  the hypermetabolic lesion on prior PET-CT and measures up to 2.8 cm on image 46 of series 8. I believe there are both intrahepatic and adjacent extrahepatic nodal components. No other hepatic lesions are identified. Thin section MRCP images demonstrate multiple small gallstones. There is no biliary dilatation or evidence of choledocholithiasis. Mild gallbladder wall thickening is present as on recent CT. No focal surrounding inflammation identified. Pancreas: The pancreas is atrophied. There is a 7 mm cyst in the pancreatic tail which is unlikely to be clinically significant. No evidence of pancreatic ductal dilatation. Spleen: Normal in size without focal abnormality. Adrenals/Urinary Tract: Both adrenal glands appear normal. Right renal cysts, measuring up to 5.7 cm in the posterior interpolar region. There are tiny T1 hyperintense lesions in both kidneys. No worrisome renal findings or hydronephrosis. Stomach/Bowel: No evidence of bowel wall thickening, distention or surrounding inflammatory change.Mild sigmoid diverticulosis. Vascular/Lymphatic: Stable lymph nodes in the porta hepatis and gastrohepatic ligament with associated restricted diffusion. These were mildly hypermetabolic on PET-CT. No significant vascular findings are present. Other: Mesenteric, retroperitoneal and subcutaneous edema. No focal fluid collections. Musculoskeletal: No acute or significant osseous findings. IMPRESSION: 1. Cholelithiasis with nonspecific gallbladder wall thickening as seen on recent previous studies. There is no focal pericholecystic inflammation allowing for generalized soft tissue edema. 2. No biliary dilatation or choledocholithiasis. 3. Similar appearance of known metastasis in the caudate lobe and adjacent porta hepatis lymph nodes. 4. Suggested mild anasarca with bilateral pleural effusions, mesenteric, retroperitoneal and subcutaneous edema. Electronically Signed   By: Richardean Sale M.D.   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  I have canceled that appointment pending further workup here in  the hospital  Possible acute cholecystitis versus cholangitis, resolving He is receiving IV antibiotics I would defer to primary service/general surgery for further management  Elevated troponin The cause is unknown but could be related to demand ischemia I would defer to cardiologist for further management  Acute renal failure Could be due to dehydration/sepsis, improving He is receiving hydration therapy  Discharge planning He is too ill to consider discharge I will continue to follow  Heath Lark, MD 01/24/2017  1:42 PM

## 2017-01-24 NOTE — Progress Notes (Signed)
[facility] Progress Note  Patient: Benjamin Barr IHK:[id number]   PCP: Aura Dials, MD DOB: 1945-08-13   DOA: 01/21/2017   DOS: 01/24/2017   Date of Service: the patient was seen and examined on 01/24/2017  Subjective: Feeling better, still has some cough and shortness of breath.  No abdominal pain.  Passing gas but no BM.  Some nausea but no vomiting.  Brief hospital course: Pt. with PMH of tongue cancer, liver metastasis, chemotherapy, prostate cancer, HTN, HLD, type II DM, CAD, depression; admitted on 01/21/2017, presented with complaint of generalized weakness, was found to have sepsis due to Klebsiella bacteremia likely from cholecystitis. Currently further plan is continue current treatment.  Assessment and Plan: 1.  Sepsis Klebsiella bacteremia Cholecystitis Elevated LFT. Acute kidney injury, elevated troponin. Presented with, fever, tachycardia, hypotension.  Gram-negative bacteremia seen. Started on IV vancomycin and Zosyn,, switch to IV ceftriaxone based on sensitivity.  Based on discussion with pharmacy, will switch him to IV Unasyn for now. MRSA PCR negative, blood cultures positive for Klebsiella, repeat cultures pending. Reduce IV fluid rate from 100 cc to 50 cc/h. Initially monitored on stepdown unit, telemetry for now. General surgery consulted, recommend conservative management with HIDA scan. HIDA scan negative for cholecystitis or cystic duct obstruction, MRCP negative for CBD stone or CBD inflammation. Awaiting further recommendation from surgery.  2.  Acute kidney injury. Continue IV hydration.  Likely prerenal etiology. Avoid vancomycin to minimize nephrotoxins. CT scan as well as ultrasound negative for hydronephrosis. Daily monitoring.  3.  Elevated troponin. History of CAD. HTN. Holding blood pressure medications for now. Troponin trend not consistent with ACS although the patient did have some similar chest fluttering that he had when he had  ACS in the past. Following up on echocardiogram. EKG unremarkable. Cardiology consulted following up on recommendation.  4.  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 S/P chemotherapy and radiation. Also on carboplatin and Taxol for now. Oncology consulted, recommend to hold chemotherapy and conservative management for now.  5.  Type 2 diabetes mellitus. Diabetic neuropathy. Continue sliding scale insulin for now. On clear liquid diet.  6.  Constipation. Currently resolved. Monitor regular  Diet: Clear liquid diet, advance to soft diet by dinner time. DVT Prophylaxis: subcutaneous Heparin  Advance goals of care discussion: full code  Family Communication: family was present at bedside, at the time of interview. The pt provided permission to discuss medical plan with the family. Opportunity was given to ask question and all questions were answered satisfactorily.   Disposition:  Discharge to be determined.   Consultants: General surgery Procedures: Echocardiogram   Antibiotics: Anti-infectives (From admission, onward)   Start     Dose/Rate Route Frequency Ordered Stop   01/24/17 1000  Ampicillin-Sulbactam (UNASYN) 3 g in sodium chloride 0.9 % 100 mL IVPB     3 g 200 mL/hr over 30 Minutes Intravenous Every 6 hours 01/24/17 0911     01/23/17 1800  cefTRIAXone (ROCEPHIN) 2 g in dextrose 5 % 50 mL IVPB  Status:  Discontinued     2 g 100 mL/hr over 30 Minutes Intravenous Every 24 hours 01/23/17 1041 01/24/17 0910   01/21/17 1600  piperacillin-tazobactam (ZOSYN) IVPB 3.375 g  Status:  Discontinued     3.375 g 12.5 mL/hr over 240 Minutes Intravenous Every 8 hours 01/21/17 1130 01/23/17 1007   01/21/17 1400  ceFEPIme (MAXIPIME) 1 g in dextrose 5 %  50 mL IVPB  Status:  Discontinued     1 g 100 mL/hr over 30 Minutes Intravenous Every 8 hours 01/21/17 1036 01/21/17 1130   01/21/17 1200   vancomycin (VANCOCIN) IVPB 1000 mg/200 mL premix  Status:  Discontinued     1,000 mg 200 mL/hr over 60 Minutes Intravenous Every 24 hours 01/21/17 1036 01/22/17 0906   01/21/17 0645  ceFEPIme (MAXIPIME) 2 g in dextrose 5 % 50 mL IVPB     2 g 100 mL/hr over 30 Minutes Intravenous  Once 01/21/17 0643 01/21/17 0741   01/21/17 0645  vancomycin (VANCOCIN) IVPB 1000 mg/200 mL premix     1,000 mg 200 mL/hr over 60 Minutes Intravenous  Once 01/21/17 0643 01/21/17 0842       Objective: Physical Exam: Vitals:   01/23/17 1347 01/23/17 1428 01/23/17 2101 01/24/17 0623  BP: (!) 154/74 (!) 109/59 119/73 134/71  Pulse: 91 (!) 103 86 81  Resp: 16 18 18 18   Temp: 99.7 F (37.6 C) 99.5 F (37.5 C) 99.4 F (37.4 C) 99.6 F (37.6 C)  TempSrc: Oral Oral Oral Oral  SpO2: 99% 94% 100% 96%  Weight:      Height:        Intake/Output Summary (Last 24 hours) at 01/24/2017 1352 Last data filed at 01/24/2017 1300 Gross per 24 hour  Intake 1711.67 ml  Output 200 ml  Net 1511.67 ml   Filed Weights   01/21/17 0052 01/22/17 0420  Weight: 77.6 kg (171 lb) 79.7 kg (175 lb 11.3 oz)   General: Alert, Awake and Oriented to Time, Place and Person. Appear in mild distress, affect appropriate Eyes: PERRL, Conjunctiva normal ENT: Oral Mucosa clear moist. Neck: no JVD, no Abnormal Mass Or lumps Cardiovascular: S1 and S2 Present, no Murmur, Peripheral Pulses Present Respiratory: normal respiratory effort, Bilateral Air entry equal and Decreased, no use of accessory muscle, Clear to Auscultation, no Crackles, no wheezes Abdomen: Bowel Sound present, Soft and no tenderness, no hernia Skin: no redness, no Rash, no induration Extremities: no Pedal edema, no calf tenderness Neurologic: Grossly no focal neuro deficit. Bilaterally Equal motor strength  Data Reviewed: CBC: Recent Labs  Lab 01/21/17 0129 01/22/17 0601 01/23/17 0516 01/24/17 0543  WBC 7.7 6.5 5.0 4.1  NEUTROABS 6.8  --  4.6  --   HGB 13.8  12.1* 13.7 11.1*  HCT 40.9 36.6* 40.6 33.7*  MCV 89.3 89.5 90.0 88.7  PLT 135* 104* 91* 79*   Basic Metabolic Panel: Recent Labs  Lab 01/21/17 0129 01/21/17 1500 01/22/17 0601 01/23/17 0516 01/24/17 0543  NA 134* 138 139 142 139  K 3.8 3.7 4.0 3.4* 2.9*  CL 99* 108 107 109 108  CO2 25 23 22 23 22   GLUCOSE 175* 119* 111* 144* 109*  BUN 23* 20 30* 33* 27*  CREATININE 1.41* 1.21 2.10* 2.01* 1.36*  CALCIUM 9.4 7.8* 8.3* 8.5* 7.8*  MG  --   --   --  1.7 1.5*    Liver Function Tests: Recent Labs  Lab 01/21/17 0129 01/21/17 1500 01/22/17 0601 01/23/17 0516 01/24/17 0543  AST 251* 109* 61* 51* 45*  ALT 148* 104* 83* 69* 48  ALKPHOS 363* 249* 239* 234* 228*  BILITOT 4.6* 4.3* 4.1* 2.5* 1.2  PROT 6.3* 5.1* 5.5* 6.1* 5.1*  ALBUMIN 3.0* 2.3* 2.3* 2.5* 2.0*   No results for input(s): LIPASE, AMYLASE in the last 168 hours. No results for input(s): AMMONIA in the last 168 hours. Coagulation Profile: Recent Labs  Lab 01/24/17 0543  INR 1.18   Cardiac Enzymes: Recent Labs  Lab 01/21/17 0129 01/21/17 0917 01/21/17 1500 01/22/17 0601  TROPONINI 0.13* 0.60* 0.68* 0.69*   BNP (last 3 results) No results for input(s): PROBNP in the last 8760 hours. CBG: Recent Labs  Lab 01/23/17 1138 01/23/17 1640 01/23/17 2055 01/24/17 0801 01/24/17 1202  GLUCAP 139* 111* 120* 100* 102*   Studies: Nm Hepatobiliary Liver Func  Result Date: 01/24/2017 CLINICAL DATA:  Gallstones in gallbladder wall thickening. Generalized weakness. EXAM: NUCLEAR MEDICINE HEPATOBILIARY IMAGING TECHNIQUE: Sequential images of the abdomen were obtained out to 60 minutes following intravenous administration of radiopharmaceutical. RADIOPHARMACEUTICALS:  5.3 mCi Tc-49m  Choletec IV 3.18 mg morphine COMPARISON:  01/21/2017 ultrasound FINDINGS: Satisfactory uptake of radiopharmaceutical from the blood pool. Bowel activity visible at 12 minutes. There is some initial non filling of the gallbladder over 60  minutes. With IV morphine administration there is slow filling of the gallbladder ovaries subsequent 30 minutes. , indicating cystic duct patency. IMPRESSION: 1. Initial non filling of the gallbladder but after morphine administration the gallbladder is observed to slowly fill, indicating patency of the cystic duct. Radiopharmaceutical does extend into the bowel as well, indicating patency of the common bile duct. Electronically Signed   By: Van Clines M.D.   On: 01/24/2017 11:18   Mr Abdomen Mrcp Wo Contrast  Result Date: 01/24/2017 CLINICAL DATA:  Hepatic metastatic disease. History of tongue and prostate cancer. EXAM: MRI ABDOMEN WITHOUT CONTRAST  (INCLUDING MRCP) TECHNIQUE: Multiplanar multisequence MR imaging of the abdomen was performed. Heavily T2-weighted images of the biliary and pancreatic ducts were obtained, and three-dimensional MRCP images were rendered by post processing. COMPARISON:  PET-CT 12/09/2016. abdominopelvic CT and abdominal ultrasound 01/21/2017. FINDINGS: Lower chest: Small right-greater-than-left pleural effusions with mild dependent atelectasis at both lung bases. Hepatobiliary: There is a lesion in the caudate lobe which demonstrates low T1 signal, increased T2 signal and restricted diffusion. This corresponds with the hypermetabolic lesion on prior PET-CT and measures up to 2.8 cm on image 46 of series 8. I believe there are both intrahepatic and adjacent extrahepatic nodal components. No other hepatic lesions are identified. Thin section MRCP images demonstrate multiple small gallstones. There is no biliary dilatation or evidence of choledocholithiasis. Mild gallbladder wall thickening is present as on recent CT. No focal surrounding inflammation identified. Pancreas: The pancreas is atrophied. There is a 7 mm cyst in the pancreatic tail which is unlikely to be clinically significant. No evidence of pancreatic ductal dilatation. Spleen: Normal in size without focal  abnormality. Adrenals/Urinary Tract: Both adrenal glands appear normal. Right renal cysts, measuring up to 5.7 cm in the posterior interpolar region. There are tiny T1 hyperintense lesions in both kidneys. No worrisome renal findings or hydronephrosis. Stomach/Bowel: No evidence of bowel wall thickening, distention or surrounding inflammatory change.Mild sigmoid diverticulosis. Vascular/Lymphatic: Stable lymph nodes in the porta hepatis and gastrohepatic ligament with associated restricted diffusion. These were mildly hypermetabolic on PET-CT. No significant vascular findings are present. Other: Mesenteric, retroperitoneal and subcutaneous edema. No focal fluid collections. Musculoskeletal: No acute or significant osseous findings. IMPRESSION: 1. Cholelithiasis with nonspecific gallbladder wall thickening as seen on recent previous studies. There is no focal pericholecystic inflammation allowing for generalized soft tissue edema. 2. No biliary dilatation or choledocholithiasis. 3. Similar appearance of known metastasis in the caudate lobe and adjacent porta hepatis lymph nodes. 4. Suggested mild anasarca with bilateral pleural effusions, mesenteric, retroperitoneal and subcutaneous edema. Electronically Signed   By: Richardean Sale  M.D.   On: 01/24/2017 12:17   Mr 3d Recon At Scanner  Result Date: 01/24/2017 CLINICAL DATA:  Hepatic metastatic disease. History of tongue and prostate cancer. EXAM: MRI ABDOMEN WITHOUT CONTRAST  (INCLUDING MRCP) TECHNIQUE: Multiplanar multisequence MR imaging of the abdomen was performed. Heavily T2-weighted images of the biliary and pancreatic ducts were obtained, and three-dimensional MRCP images were rendered by post processing. COMPARISON:  PET-CT 12/09/2016. abdominopelvic CT and abdominal ultrasound 01/21/2017. FINDINGS: Lower chest: Small right-greater-than-left pleural effusions with mild dependent atelectasis at both lung bases. Hepatobiliary: There is a lesion in the  caudate lobe which demonstrates low T1 signal, increased T2 signal and restricted diffusion. This corresponds with the hypermetabolic lesion on prior PET-CT and measures up to 2.8 cm on image 46 of series 8. I believe there are both intrahepatic and adjacent extrahepatic nodal components. No other hepatic lesions are identified. Thin section MRCP images demonstrate multiple small gallstones. There is no biliary dilatation or evidence of choledocholithiasis. Mild gallbladder wall thickening is present as on recent CT. No focal surrounding inflammation identified. Pancreas: The pancreas is atrophied. There is a 7 mm cyst in the pancreatic tail which is unlikely to be clinically significant. No evidence of pancreatic ductal dilatation. Spleen: Normal in size without focal abnormality. Adrenals/Urinary Tract: Both adrenal glands appear normal. Right renal cysts, measuring up to 5.7 cm in the posterior interpolar region. There are tiny T1 hyperintense lesions in both kidneys. No worrisome renal findings or hydronephrosis. Stomach/Bowel: No evidence of bowel wall thickening, distention or surrounding inflammatory change.Mild sigmoid diverticulosis. Vascular/Lymphatic: Stable lymph nodes in the porta hepatis and gastrohepatic ligament with associated restricted diffusion. These were mildly hypermetabolic on PET-CT. No significant vascular findings are present. Other: Mesenteric, retroperitoneal and subcutaneous edema. No focal fluid collections. Musculoskeletal: No acute or significant osseous findings. IMPRESSION: 1. Cholelithiasis with nonspecific gallbladder wall thickening as seen on recent previous studies. There is no focal pericholecystic inflammation allowing for generalized soft tissue edema. 2. No biliary dilatation or choledocholithiasis. 3. Similar appearance of known metastasis in the caudate lobe and adjacent porta hepatis lymph nodes. 4. Suggested mild anasarca with bilateral pleural effusions, mesenteric,  retroperitoneal and subcutaneous edema. Electronically Signed   By: Richardean Sale M.D.   On: 01/24/2017 12:17    Scheduled Meds: . chlorhexidine  15 mL Mouth Rinse BID  . insulin aspart  0-15 Units Subcutaneous TID WC  . lactose free nutrition  237 mL Oral Q24H  . mouth rinse  15 mL Mouth Rinse q12n4p  . metoprolol tartrate  12.5 mg Oral BID  . mirtazapine  30 mg Oral QHS  . morphine      . multivitamin with minerals  1 tablet Oral Daily  . pantoprazole (PROTONIX) IV  40 mg Intravenous Q24H  . polyethylene glycol  17 g Oral Daily  . potassium chloride  40 mEq Oral Once   Continuous Infusions: . sodium chloride 100 mL/hr at 01/24/17 0858  . ampicillin-sulbactam (UNASYN) IV Stopped (01/24/17 1259)  . magnesium sulfate 1 - 4 g bolus IVPB 2 g (01/24/17 1259)  . potassium chloride 10 mEq (01/24/17 1300)   PRN Meds: acetaminophen, ondansetron (ZOFRAN) IV, sodium chloride flush  Time spent: 35 minutes  Author: Berle Mull, MD [facility] Pager: [phone] 01/24/2017 1:52 PM  If 7PM-7AM, please contact night-coverage at [email or web address], password [facility]

## 2017-01-25 LAB — CBC
HCT: 35.4 % — ABNORMAL LOW (ref 39.0–52.0)
Hemoglobin: 11.6 g/dL — ABNORMAL LOW (ref 13.0–17.0)
MCH: 29 pg (ref 26.0–34.0)
MCHC: 32.8 g/dL (ref 30.0–36.0)
MCV: 88.5 fL (ref 78.0–100.0)
PLATELETS: 87 10*3/uL — AB (ref 150–400)
RBC: 4 MIL/uL — ABNORMAL LOW (ref 4.22–5.81)
RDW: 14.5 % (ref 11.5–15.5)
WBC: 5.2 10*3/uL (ref 4.0–10.5)

## 2017-01-25 LAB — COMPREHENSIVE METABOLIC PANEL
ALBUMIN: 2 g/dL — AB (ref 3.5–5.0)
ALT: 37 U/L (ref 17–63)
ANION GAP: 8 (ref 5–15)
AST: 33 U/L (ref 15–41)
Alkaline Phosphatase: 250 U/L — ABNORMAL HIGH (ref 38–126)
BUN: 25 mg/dL — ABNORMAL HIGH (ref 6–20)
CALCIUM: 8 mg/dL — AB (ref 8.9–10.3)
CO2: 21 mmol/L — AB (ref 22–32)
Chloride: 110 mmol/L (ref 101–111)
Creatinine, Ser: 1.09 mg/dL (ref 0.61–1.24)
GFR calc non Af Amer: >60 mL/min (ref >60)
GLUCOSE: 144 mg/dL — AB (ref 65–99)
POTASSIUM: 2.7 mmol/L — AB (ref 3.5–5.1)
SODIUM: 139 mmol/L (ref 135–145)
TOTAL PROTEIN: 4.9 g/dL — AB (ref 6.5–8.1)
Total Bilirubin: 1.1 mg/dL (ref 0.3–1.2)

## 2017-01-25 LAB — POTASSIUM: Potassium: 3.2 mmol/L — ABNORMAL LOW (ref 3.5–5.1)

## 2017-01-25 LAB — GLUCOSE, CAPILLARY
GLUCOSE-CAPILLARY: 109 mg/dL — AB (ref 65–99)
GLUCOSE-CAPILLARY: 106 mg/dL — AB (ref 65–99)
Glucose-Capillary: 116 mg/dL — ABNORMAL HIGH (ref 65–99)

## 2017-01-25 MED ORDER — POTASSIUM CHLORIDE 10 MEQ/100ML IV SOLN
10.0000 meq | INTRAVENOUS | Status: AC
  Administered 2017-01-25 (×5): 10 meq via INTRAVENOUS
  Filled 2017-01-25 (×5): qty 100

## 2017-01-25 MED ORDER — HEPARIN SOD (PORK) LOCK FLUSH 100 UNIT/ML IV SOLN
500.0000 [IU] | INTRAVENOUS | Status: AC | PRN
  Administered 2017-01-25: 500 [IU]

## 2017-01-25 MED ORDER — ADULT MULTIVITAMIN W/MINERALS CH: 1.0000 | ORAL_TABLET | Freq: Every day | ORAL | 0 refills | Status: AC

## 2017-01-25 MED ORDER — METOPROLOL TARTRATE 25 MG PO TABS: 12.5000 mg | ORAL_TABLET | Freq: Two times a day (BID) | ORAL | 0 refills | Status: DC

## 2017-01-25 MED ORDER — POLYETHYLENE GLYCOL 3350 17 G PO PACK: 17.0000 g | PACK | Freq: Every day | ORAL | 0 refills | Status: DC | PRN

## 2017-01-25 MED ORDER — AMOXICILLIN-POT CLAVULANATE 875-125 MG PO TABS: 1.0000 | ORAL_TABLET | Freq: Two times a day (BID) | ORAL | 0 refills | Status: AC

## 2017-01-25 MED ORDER — PANTOPRAZOLE SODIUM 40 MG PO TBEC: 40.0000 mg | DELAYED_RELEASE_TABLET | Freq: Every day | ORAL | 0 refills | Status: DC

## 2017-01-25 MED ORDER — POTASSIUM CHLORIDE CRYS ER 20 MEQ PO TBCR: 40.0000 meq | EXTENDED_RELEASE_TABLET | Freq: Two times a day (BID) | ORAL | 0 refills | Status: DC

## 2017-01-25 MED ORDER — PANTOPRAZOLE SODIUM 40 MG PO TBEC
40.0000 mg | DELAYED_RELEASE_TABLET | Freq: Every day | ORAL | Status: DC
  Administered 2017-01-25: 40 mg via ORAL
  Filled 2017-01-25: qty 1

## 2017-01-25 NOTE — Progress Notes (Signed)
Patient and family given discharge, follow up, and medication instructions, verbalized understanding, IV team deaccessed port, telemetry removed, personal belongings and prescriptions with patient, family to transport home

## 2017-01-25 NOTE — Progress Notes (Signed)
Key Points: Use following P&T approved IV to PO non-antibiotic change policy.  Description contains the criteria that are approved Note: Policy Excludes:  Esophagectomy patientsPHARMACIST - PHYSICIAN COMMUNICATION DR:   Karleen Hampshire CONCERNING: IV to Oral Route Change Policy  RECOMMENDATION: This patient is receiving protonix by the intravenous route.  Based on criteria approved by the Pharmacy and Therapeutics Committee, the intravenous medication(s) is/are being converted to the equivalent oral dose form(s).   DESCRIPTION: These criteria include:  The patient is eating (either orally or via tube) and/or has been taking other orally administered medications for a least 24 hours  The patient has no evidence of active gastrointestinal bleeding or impaired GI absorption (gastrectomy, short bowel, patient on TNA or NPO).  If you have questions about this conversion, please contact the Pharmacy Department  []   [phone] )  [facility] []   [phone] )  [facility]  []   [phone] )  [facility] [x]   [phone] )  [place], [place], [facility] 01/25/2017 10:12 AM

## 2017-01-25 NOTE — Care Management Important Message (Signed)
Important Message  Patient Details  Name: [name] MRN: [id number] Date of Birth: Dec 10, 1945   Medicare Important Message Given:  Yes    [name] 01/25/2017, 12:05 [place] Message  Patient Details  Name: Benjamin Barr MRN: [id number] Date of Birth: [birth year]   Medicare Important Message Given:  Yes    [name] 01/25/2017, 12:04 PM

## 2017-01-25 NOTE — Progress Notes (Signed)
CC:  Generalized weakness with possible cholecysitis  Subjective: He denies every having had abdominal pain or nausea.    Objective: Vital signs in last 24 hours: Temp:  [98 F (36.7 C)-98.4 F (36.9 C)] 98.4 F (36.9 C) (12/18 0502) Pulse Rate:  [73-77] 77 (12/18 0502) Resp:  [18] 18 (12/18 0502) BP: (125-126)/(73-81) 125/81 (12/18 0502) SpO2:  [96 %-97 %] 96 % (12/18 0502) Last BM Date: 01/23/17 600 PO 700 IV 200 urine recorded Afebrile, VSS LFT's improving Creatinine improved  HIDA shows slow filling of GB after Morphine MRCP:. Thin section MRCP images demonstrate multiple small gallstones. There is no biliary dilatation or evidence of choledocholithiasis. Mild gallbladder wall thickening is present as on recent CT. No focal surrounding inflammation identified. Similar appearance of known metastasis in the caudate lobe and adjacent porta hepatis lymph nodes.  Mild anasarca with bilateral pleural effusions,   Intake/Output from previous day: 12/17 0701 - 12/18 0700 In: 2247.5 [I.V.:1497.5; IV Piggyback:750] Out: -  Intake/Output this shift: Total I/O In: 655 [I.V.:255; IV Piggyback:400] Out: 450 [Urine:450]  General appearance: alert, cooperative and no distress GI: soft, non-tender; bowel sounds normal; no masses,  no organomegaly  Lab Results:  Recent Labs    01/24/17 0543 01/25/17 0443  WBC 4.1 5.2  HGB 11.1* 11.6*  HCT 33.7* 35.4*  PLT 79* 87*    BMET Recent Labs    01/24/17 0543 01/25/17 0443  NA 139 139  K 2.9* 2.7*  CL 108 110  CO2 22 21*  GLUCOSE 109* 144*  BUN 27* 25*  CREATININE 1.36* 1.09  CALCIUM 7.8* 8.0*   PT/INR Recent Labs    01/24/17 0543  LABPROT 14.9  INR 1.18    Recent Labs  Lab 01/21/17 1500 01/22/17 0601 01/23/17 0516 01/24/17 0543 01/25/17 0443  AST 109* 61* 51* 45* 33  ALT 104* 83* 69* 48 37  ALKPHOS 249* 239* 234* 228* 250*  BILITOT 4.3* 4.1* 2.5* 1.2 1.1  PROT 5.1* 5.5* 6.1* 5.1* 4.9*  ALBUMIN  2.3* 2.3* 2.5* 2.0* 2.0*     Lipase  No results found for: LIPASE   Medications: . chlorhexidine  15 mL Mouth Rinse BID  . insulin aspart  0-15 Units Subcutaneous TID WC  . lactose free nutrition  237 mL Oral Q24H  . mouth rinse  15 mL Mouth Rinse q12n4p  . metoprolol tartrate  12.5 mg Oral BID  . mirtazapine  30 mg Oral QHS  . multivitamin with minerals  1 tablet Oral Daily  . pantoprazole  40 mg Oral Daily  . polyethylene glycol  17 g Oral Daily  . potassium chloride  40 mEq Oral Once   . sodium chloride 50 mL/hr at 01/25/17 0356  . ampicillin-sulbactam (UNASYN) IV Stopped (01/25/17 1025)  . potassium chloride 10 mEq (01/25/17 1059)   Anti-infectives (From admission, onward)   Start     Dose/Rate Route Frequency Ordered Stop   01/24/17 1000  Ampicillin-Sulbactam (UNASYN) 3 g in sodium chloride 0.9 % 100 mL IVPB     3 g 200 mL/hr over 30 Minutes Intravenous Every 6 hours 01/24/17 0911     01/23/17 1800  cefTRIAXone (ROCEPHIN) 2 g in dextrose 5 % 50 mL IVPB  Status:  Discontinued     2 g 100 mL/hr over 30 Minutes Intravenous Every 24 hours 01/23/17 1041 01/24/17 0910   01/21/17 1600  piperacillin-tazobactam (ZOSYN) IVPB 3.375 g  Status:  Discontinued     3.375 g 12.5 mL/hr  over 240 Minutes Intravenous Every 8 hours 01/21/17 1130 01/23/17 1007   01/21/17 1400  ceFEPIme (MAXIPIME) 1 g in dextrose 5 % 50 mL IVPB  Status:  Discontinued     1 g 100 mL/hr over 30 Minutes Intravenous Every 8 hours 01/21/17 1036 01/21/17 1130   01/21/17 1200  vancomycin (VANCOCIN) IVPB 1000 mg/200 mL premix  Status:  Discontinued     1,000 mg 200 mL/hr over 60 Minutes Intravenous Every 24 hours 01/21/17 1036 01/22/17 0906   01/21/17 0645  ceFEPIme (MAXIPIME) 2 g in dextrose 5 % 50 mL IVPB     2 g 100 mL/hr over 30 Minutes Intravenous  Once 01/21/17 0643 01/21/17 0741   01/21/17 0645  vancomycin (VANCOCIN) IVPB 1000 mg/200 mL premix     1,000 mg 200 mL/hr over 60 Minutes Intravenous  Once 01/21/17  4287 01/21/17 6811      Assessment/Plan Possible cholecystitis -  HIDA/MRCP Negative, he also has never had symptoms... Tongue cancer with liver metastasis -on chemotherapy History of prostate cancer with cryoablation  CAD on Plavix Hypertension Type 2 diabetes -insulin-dependent History of MI Hyperlipidemia  FEN:  IV fluids/Full liquids ID:  Vancomycin 12/14 - 12/15, Cefepime 12/14, Zosyn 12/14-12/16, Unasyn 12/17 =>> day 1 DVT:  SCD's Foley:  none Follow up:  Medicine  Plan:We will sign off. Please call if we can be of help in the future.    LOS: 4 days    Clovis Riley 01/25/2017 [phone]

## 2017-01-25 NOTE — Progress Notes (Signed)
CRITICAL VALUE STICKER  CRITICAL VALUE: K 2.7  RECEIVER (on-site recipient of call): Ardeen Garland RN  DATE & TIME NOTIFIED: 01/25/17  MD NOTIFIED: [place]   TIME OF NOTIFICATION: [phone]  RESPONSE: IV replacement

## 2017-01-25 NOTE — Evaluation (Addendum)
Physical Therapy Evaluation Patient Details Name: Benjamin Barr MRN: [id number] DOB: 04/03/1945 Today's Date: 01/25/2017   History of Present Illness  71 yo male admitted with sepsis, possible cholecystitis, fall in hospital. hx of prostate cancer, met tongue cancer, DM, CAD  Clinical Impression  On eval, pt was Min guard assist for mobility. He walked ~200 feet with a RW. Noted intermittent mild LE instability/buckling. Pt tolerated activity well. Discussed d/c plan with pt and wife. Recommendation is for HHPT f/u. Will follow and progress activity as tolerated.      Follow Up Recommendations Home health PT;Supervision - Intermittent    Equipment Recommendations  Rolling walker with 5" wheels; 3in1    Recommendations for Other Services       Precautions / Restrictions Precautions Precautions: Fall Restrictions Weight Bearing Restrictions: No      Mobility  Bed Mobility Overal bed mobility: Needs Assistance Bed Mobility: Rolling;Sidelying to Sit Rolling: Supervision Sidelying to sit: Min assist       General bed mobility comments: Assist for trunk to upright. Increased time. VCs safety, technique.   Transfers Overall transfer level: Needs assistance Equipment used: Rolling walker (2 wheeled) Transfers: Sit to/from Stand Sit to Stand: Min guard         General transfer comment: close guard for safety. VCs hand placement  Ambulation/Gait Ambulation/Gait assistance: Min guard Ambulation Distance (Feet): 200 Feet Assistive device: Rolling walker (2 wheeled) Gait Pattern/deviations: Step-through pattern;Decreased stride length     General Gait Details: close guard for safety. Noted intermittent LEs instability/buckling. Pt tolerated distance well.   Stairs            Wheelchair Mobility    Modified Rankin (Stroke Patients Only)       Balance Overall balance assessment: Needs assistance;History of Falls           Standing balance-Leahy Scale:  Fair                               Pertinent Vitals/Pain Pain Assessment: No/denies pain    Home Living Family/patient expects to be discharged to:: Private residence Living Arrangements: Spouse/significant other Available Help at Discharge: Family Type of Home: House Home Access: Stairs to enter Entrance Stairs-Rails: None Technical brewer of Steps: 1 Home Layout: One level Home Equipment: Electronics engineer Comments: borrowed walker from friend    Prior Function Level of Independence: Independent               Journalist, newspaper        Extremity/Trunk Assessment   Upper Extremity Assessment Upper Extremity Assessment: Overall WFL for tasks assessed    Lower Extremity Assessment Lower Extremity Assessment: Generalized weakness(pt reports some impaired sensation in bilateral LEs)    Cervical / Trunk Assessment Cervical / Trunk Assessment: Normal  Communication   Communication: No difficulties  Cognition Arousal/Alertness: Awake/alert Behavior During Therapy: WFL for tasks assessed/performed Overall Cognitive Status: Within Functional Limits for tasks assessed                                        General Comments      Exercises LAQ, seated,15 reps, bilateral, active Ankle Pumps, seated, 15 reps, bilateral, active Quad Sets, seated, 15 reps, bilateral, active Marching, seated, 15 reps, bilateral, active   Assessment/Plan    PT Assessment Patient needs continued PT services  PT Problem List Decreased strength;Decreased balance;Decreased mobility;Decreased knowledge of use of DME       PT Treatment Interventions DME instruction;Gait training;Functional mobility training;Therapeutic activities;Balance training;Patient/family education;Therapeutic exercise    PT Goals (Current goals can be found in the Care Plan section)  Acute Rehab PT Goals Patient Stated Goal: none stated PT Goal Formulation: With  patient/family Time For Goal Achievement: 02/08/17 Potential to Achieve Goals: Good    Frequency Min 3X/week   Barriers to discharge        Co-evaluation               AM-PAC PT "6 Clicks" Daily Activity  Outcome Measure Difficulty turning over in bed (including adjusting bedclothes, sheets and blankets)?: A Little Difficulty moving from lying on back to sitting on the side of the bed? : Unable Difficulty sitting down on and standing up from a chair with arms (e.g., wheelchair, bedside commode, etc,.)?: A Little Help needed moving to and from a bed to chair (including a wheelchair)?: A Little Help needed walking in hospital room?: A Little Help needed climbing 3-5 steps with a railing? : A Little 6 Click Score: 16    End of Session Equipment Utilized During Treatment: Gait belt Activity Tolerance: Patient tolerated treatment well Patient left: in chair;with call bell/phone within reach;with family/visitor present(chair alarm box not available in room)   PT Visit Diagnosis: Muscle weakness (generalized) (M62.81);Difficulty in walking, not elsewhere classified (R26.2)    Time: 1610-9604 PT Time Calculation (min) (ACUTE ONLY): 37 min   Charges:   PT Evaluation $PT Eval Moderate Complexity: 1 Mod PT Treatments $Gait Training: 8-22 mins   PT G Codes:          Weston Anna, MPT Pager: [phone]

## 2017-01-26 NOTE — Discharge Summary (Signed)
Physician Discharge Summary  Benjamin Barr NFA:[id number] DOB: 1946-02-03 DOA: 01/21/2017  PCP: Aura Dials, MD  Admit date: 01/21/2017 Discharge date: 01/25/2017  Admitted From: Home.  Disposition:  Home.   Recommendations for Outpatient Follow-up:  1. Follow up with PCP in 1-2 weeks 2. Please obtain BMP/CBC in one week 3. Please follow up with oncology as recommended.   Home Health:YES  Discharge Condition:STABLE.  CODE STATUS: FULL CODE.  Diet recommendation: Heart Healthy  Brief/Interim Summary:  Pt. with PMH of tongue cancer, liver metastasis, chemotherapy, prostate cancer, HTN, HLD, type II DM, CAD, depression; admitted on 01/21/2017, presented with complaint of generalized weakness, was found to have sepsis due to Klebsiella bacteremia initially thought from cholecystitis. But HIDA SCAN was negative.      Discharge Diagnoses:  Principal Problem:   Sepsis ([place]) Active Problems:   CAD S/P percutaneous coronary angioplasty   Diabetes mellitus with diabetic neuropathy, with long-term current use of insulin (H[facility])   Cancer of base of tongue (H[facility])   Constipation   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(H[facility])   AKI (acute kidney injury) ([place])   Elevated liver enzymes  Sepsis Klebsiella bacteremia Cholecystitis Elevated LFT. Acute kidney injury, elevated troponin. Presented with, fever, tachycardia, hypotension.  Gram-negative bacteremia seen. Started on IV vancomycin and Zosyn,, switch to IV ceftriaxone based on sensitivity. discharged on oral augmentin to complete the course. MRSA PCR negative, blood cultures positive for Klebsiella, repeat cultures negative so far.  . General surgery consulted, recommend conservative management with HIDA scan. HIDA scan negative for cholecystitis or cystic duct obstruction, MRCP negative for CBD stone or CBD inflammation. No further surgical intervention by surgery and discussed with Dr Alvy Bimler and plan for discharge home with oral antibiotics to  complete the course.   2.  Acute kidney injury. Likely prerenal etiology. CT scan as well as ultrasound negative for hydronephrosis. Creatinine back to baseline.    Hypokalemia:  Replaced.   3.  Elevated troponin. History of CAD. HTN. Well controlled.  Troponin trend not consistent with ACS although the patient did have some similar chest fluttering that he had when he had ACS in the past.  EKG unremarkable. Cardiology consulted  And recommendations given.  Echocardiogram reveiwed and discussed the results with the patient,  Resume plavix on discharge.   4.  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 S/P chemotherapy and radiation. Also on carboplatin and Taxol for now. Oncology consulted, recommend to hold chemotherapy and conservative management for now.  5.  Type 2 diabetes mellitus. Diabetic neuropathy. Resume home medications.   6.  Constipation. Currently resolved. Monitor regular    Discharge Instructions  Discharge Instructions    Diet - low sodium heart healthy   Complete by:  As directed    Discharge instructions   Complete by:  As directed    Please follow up with cardiology as recommended for possible stress test.  Please follow up with PCP in one week, before the antibiotics are completed.     Allergies as of 01/25/2017      Reactions   Cetuximab Anaphylaxis, Other (See Comments)   Cardiopulmonary arrest      Medication List    TAKE these medications   amoxicillin-clavulanate 875-125 MG tablet Commonly known as:  AUGMENTIN Take 1 tablet by mouth every 12 (twelve) hours for 10 days.   clopidogrel 75 MG tablet Commonly known as:  PLAVIX TAKE 1 TABLET  EVERY DAY   insulin glargine 100 UNIT/ML injection Commonly known as:  LANTUS Inject 0.5 mLs (50 Units total) into the skin at bedtime.   metoprolol tartrate 25 MG tablet Commonly known as:   LOPRESSOR Take 0.5 tablets (12.5 mg total) by mouth 2 (two) times daily.   mirtazapine 30 MG tablet Commonly known as:  REMERON Take 1 tablet (30 mg total) by mouth at bedtime.   multivitamin with minerals Tabs tablet Take 1 tablet by mouth daily.   ondansetron 8 MG tablet Commonly known as:  ZOFRAN Take 1 tablet (8 mg total) by mouth every 8 (eight) hours as needed for nausea or vomiting.   pantoprazole 40 MG tablet Commonly known as:  PROTONIX Take 1 tablet (40 mg total) by mouth daily.   polyethylene glycol packet Commonly known as:  MIRALAX / GLYCOLAX Take 17 g by mouth daily as needed.   potassium chloride SA 20 MEQ tablet Commonly known as:  K-DUR,KLOR-CON Take 2 tablets (40 mEq total) by mouth 2 (two) times daily for 2 doses.   prochlorperazine 10 MG tablet Commonly known as:  COMPAZINE Take 1 tablet (10 mg total) by mouth every 6 (six) hours as needed for nausea or vomiting.      Follow-up Information    Aura Dials, MD. Schedule an appointment as soon as possible for a visit in 1 week(s).   Specialty:  Family Medicine Contact information: [address], [address] [place] [address] [phone]          Allergies  Allergen Reactions  . Cetuximab Anaphylaxis and Other (See Comments)    Cardiopulmonary arrest    Consultations:   GENERAL SURGERY.   Procedures/Studies: Ct Abdomen Pelvis Wo Contrast  Result Date: 01/21/2017 CLINICAL DATA:  Abdominal distension. Head neck cancer with liver metastasis. On chemotherapy. EXAM: CT ABDOMEN AND PELVIS WITHOUT CONTRAST TECHNIQUE: Multidetector CT imaging of the abdomen and pelvis was performed following the standard protocol without IV contrast. COMPARISON:  12/09/2016 PET. FINDINGS: Lower chest: Right greater than left base atelectasis. Normal heart size with multivessel coronary artery atherosclerosis. Trace right pleural fluid is new. Hepatobiliary: Minimal exclusion of the hepatic dome. Subtle  low-density caudate lobe lesion is felt to be similar, on the order of 3.2 x 2.4 cm on image 12/series 2. Small gallstones. Mild gallbladder distension. Subtle pericholecystic edema, including image 21/series 2. No biliary duct dilatation. Pancreas: Mild pancreatic atrophy, without duct dilatation or dominant mass. Spleen: Normal in size, without focal abnormality. Adrenals/Urinary Tract: Normal adrenal glands. Punctate left renal collecting system calculi, most apparent on coronal reformats. Low-density right renal lesions are likely cysts, including at 5.0 cm. No hydroureter or ureteric calculi. No bladder calculi. trace air within the nondependent bladder, including on image 75/series 2. Stomach/Bowel: Normal stomach, without wall thickening. Scattered colonic diverticula. Normal terminal ileum. Normal small bowel. Vascular/Lymphatic: Aortic and branch vessel atherosclerosis. 1.3 cm portal caval node is similar to on the prior. No pelvic sidewall adenopathy. Reproductive: Mild prostatomegaly. Other: No significant free fluid.  No free intraperitoneal air. Musculoskeletal:Right iliac bone island.  Thoracolumbar spondylosis IMPRESSION: 1. Cholelithiasis with gallbladder distension and subtle pericholecystic edema. Findings suspicious for acute cholecystitis. Right upper quadrant ultrasound could confirm. 2. Similar caudate lobe metastasis. Similar borderline porta hepatis adenopathy. 3. Left nephrolithiasis. 4. New tiny right pleural effusion with bibasilar atelectasis. 5. Trace air in the nondependent urinary bladder. Correlate with instrumentation. 6. Coronary artery atherosclerosis. Aortic Atherosclerosis (ICD10-I70.0). Electronically Signed   By: Adria Devon.D.  On: 01/21/2017 10:19   Dg Chest 2 View  Result Date: 01/21/2017 CLINICAL DATA:  Fever, weakness and cough for 24 hours. EXAM: CHEST  2 VIEW COMPARISON:  04/12/2016 FINDINGS: Right jugular port extends to the right atrium.The lungs are clear. No  pleural effusion. Normal pulmonary vasculature. Hilar, mediastinal and cardiac contours are unremarkable unchanged. IMPRESSION: No acute cardiopulmonary findings. Electronically Signed   By: Andreas Newport M.D.   On: 01/21/2017 02:52   Nm Hepatobiliary Liver Func  Result Date: 01/24/2017 CLINICAL DATA:  Gallstones in gallbladder wall thickening. Generalized weakness. EXAM: NUCLEAR MEDICINE HEPATOBILIARY IMAGING TECHNIQUE: Sequential images of the abdomen were obtained out to 60 minutes following intravenous administration of radiopharmaceutical. RADIOPHARMACEUTICALS:  5.3 mCi Tc-52m  Choletec IV 3.18 mg morphine COMPARISON:  01/21/2017 ultrasound FINDINGS: Satisfactory uptake of radiopharmaceutical from the blood pool. Bowel activity visible at 12 minutes. There is some initial non filling of the gallbladder over 60 minutes. With IV morphine administration there is slow filling of the gallbladder ovaries subsequent 30 minutes. , indicating cystic duct patency. IMPRESSION: 1. Initial non filling of the gallbladder but after morphine administration the gallbladder is observed to slowly fill, indicating patency of the cystic duct. Radiopharmaceutical does extend into the bowel as well, indicating patency of the common bile duct. Electronically Signed   By: Van Clines M.D.   On: 01/24/2017 11:18   Mr Abdomen Mrcp Wo Contrast  Result Date: 01/24/2017 CLINICAL DATA:  Hepatic metastatic disease. History of tongue and prostate cancer. EXAM: MRI ABDOMEN WITHOUT CONTRAST  (INCLUDING MRCP) TECHNIQUE: Multiplanar multisequence MR imaging of the abdomen was performed. Heavily T2-weighted images of the biliary and pancreatic ducts were obtained, and three-dimensional MRCP images were rendered by post processing. COMPARISON:  PET-CT 12/09/2016. abdominopelvic CT and abdominal ultrasound 01/21/2017. FINDINGS: Lower chest: Small right-greater-than-left pleural effusions with mild dependent atelectasis at both  lung bases. Hepatobiliary: There is a lesion in the caudate lobe which demonstrates low T1 signal, increased T2 signal and restricted diffusion. This corresponds with the hypermetabolic lesion on prior PET-CT and measures up to 2.8 cm on image 46 of series 8. I believe there are both intrahepatic and adjacent extrahepatic nodal components. No other hepatic lesions are identified. Thin section MRCP images demonstrate multiple small gallstones. There is no biliary dilatation or evidence of choledocholithiasis. Mild gallbladder wall thickening is present as on recent CT. No focal surrounding inflammation identified. Pancreas: The pancreas is atrophied. There is a 7 mm cyst in the pancreatic tail which is unlikely to be clinically significant. No evidence of pancreatic ductal dilatation. Spleen: Normal in size without focal abnormality. Adrenals/Urinary Tract: Both adrenal glands appear normal. Right renal cysts, measuring up to 5.7 cm in the posterior interpolar region. There are tiny T1 hyperintense lesions in both kidneys. No worrisome renal findings or hydronephrosis. Stomach/Bowel: No evidence of bowel wall thickening, distention or surrounding inflammatory change.Mild sigmoid diverticulosis. Vascular/Lymphatic: Stable lymph nodes in the porta hepatis and gastrohepatic ligament with associated restricted diffusion. These were mildly hypermetabolic on PET-CT. No significant vascular findings are present. Other: Mesenteric, retroperitoneal and subcutaneous edema. No focal fluid collections. Musculoskeletal: No acute or significant osseous findings. IMPRESSION: 1. Cholelithiasis with nonspecific gallbladder wall thickening as seen on recent previous studies. There is no focal pericholecystic inflammation allowing for generalized soft tissue edema. 2. No biliary dilatation or choledocholithiasis. 3. Similar appearance of known metastasis in the caudate lobe and adjacent porta hepatis lymph nodes. 4. Suggested mild  anasarca with bilateral pleural effusions, mesenteric, retroperitoneal and  subcutaneous edema. Electronically Signed   By: Richardean Sale M.D.   On: 01/24/2017 12:17   Mr 3d Recon At Scanner  Result Date: 01/24/2017 CLINICAL DATA:  Hepatic metastatic disease. History of tongue and prostate cancer. EXAM: MRI ABDOMEN WITHOUT CONTRAST  (INCLUDING MRCP) TECHNIQUE: Multiplanar multisequence MR imaging of the abdomen was performed. Heavily T2-weighted images of the biliary and pancreatic ducts were obtained, and three-dimensional MRCP images were rendered by post processing. COMPARISON:  PET-CT 12/09/2016. abdominopelvic CT and abdominal ultrasound 01/21/2017. FINDINGS: Lower chest: Small right-greater-than-left pleural effusions with mild dependent atelectasis at both lung bases. Hepatobiliary: There is a lesion in the caudate lobe which demonstrates low T1 signal, increased T2 signal and restricted diffusion. This corresponds with the hypermetabolic lesion on prior PET-CT and measures up to 2.8 cm on image 46 of series 8. I believe there are both intrahepatic and adjacent extrahepatic nodal components. No other hepatic lesions are identified. Thin section MRCP images demonstrate multiple small gallstones. There is no biliary dilatation or evidence of choledocholithiasis. Mild gallbladder wall thickening is present as on recent CT. No focal surrounding inflammation identified. Pancreas: The pancreas is atrophied. There is a 7 mm cyst in the pancreatic tail which is unlikely to be clinically significant. No evidence of pancreatic ductal dilatation. Spleen: Normal in size without focal abnormality. Adrenals/Urinary Tract: Both adrenal glands appear normal. Right renal cysts, measuring up to 5.7 cm in the posterior interpolar region. There are tiny T1 hyperintense lesions in both kidneys. No worrisome renal findings or hydronephrosis. Stomach/Bowel: No evidence of bowel wall thickening, distention or surrounding  inflammatory change.Mild sigmoid diverticulosis. Vascular/Lymphatic: Stable lymph nodes in the porta hepatis and gastrohepatic ligament with associated restricted diffusion. These were mildly hypermetabolic on PET-CT. No significant vascular findings are present. Other: Mesenteric, retroperitoneal and subcutaneous edema. No focal fluid collections. Musculoskeletal: No acute or significant osseous findings. IMPRESSION: 1. Cholelithiasis with nonspecific gallbladder wall thickening as seen on recent previous studies. There is no focal pericholecystic inflammation allowing for generalized soft tissue edema. 2. No biliary dilatation or choledocholithiasis. 3. Similar appearance of known metastasis in the caudate lobe and adjacent porta hepatis lymph nodes. 4. Suggested mild anasarca with bilateral pleural effusions, mesenteric, retroperitoneal and subcutaneous edema. Electronically Signed   By: Richardean Sale M.D.   On: 01/24/2017 12:17   US Abdomen Limited Ruq  Result Date: 01/21/2017 CLINICAL DATA:  Abnormal CT.  Abdominal distension. EXAM: ULTRASOUND ABDOMEN LIMITED RIGHT UPPER QUADRANT COMPARISON:  Abdominal CT earlier this day. FINDINGS: Gallbladder: Distended with wall thickening measuring up to 5-6 mm. Multiple shadowing intraluminal calculi. No frank pericholecystic fluid. No sonographic Murphy sign noted by sonographer. Common bile duct: Diameter: 3 mm. Liver: Central hypodense caudate lesion measures approximately 3.8 x 2.6 x 3.3 cm. No additional focal lesion. Otherwise within normal limits in parenchymal echogenicity. Portal vein is patent on color Doppler imaging with normal direction of blood flow towards the liver. IMPRESSION: 1. Distended gallbladder with gallstones and gallbladder wall thickening. Sonographic findings are concerning for acute cholecystitis, and corroborate CT findings. However sonographic Percell Miller sign is negative, and gallbladder wall thickening may be secondary to systemic causes.  Nuclear medicine HIDA scan could be considered for further evaluation based on clinical concern. 2. Hypodense metastasis in the caudate measures approximately 3.3 cm, better characterized on cross-sectional imaging. Electronically Signed   By: Jeb Levering M.D.   On: 01/21/2017 21:44       Subjective: NO NEW COMPLAINTS.   Discharge Exam: Vitals:   01/25/17  0502 01/25/17 1431  BP: 125/81 128/67  Pulse: 77 77  Resp: 18 19  Temp: 98.4 F (36.9 C) 98.7 F (37.1 C)  SpO2: 96% 98%   Vitals:   01/24/17 0623 01/24/17 2123 01/25/17 0502 01/25/17 1431  BP: 134/71 126/73 125/81 128/67  Pulse: 81 73 77 77  Resp: 18 18 18 19   Temp: 99.6 F (37.6 C) 98 F (36.7 C) 98.4 F (36.9 C) 98.7 F (37.1 C)  TempSrc: Oral Oral Oral Oral  SpO2: 96% 97% 96% 98%  Weight:      Height:        General: Pt is alert, awake, not in acute distress Cardiovascular: RRR, S1/S2 +, no rubs, no gallops Respiratory: CTA bilaterally, no wheezing, no rhonchi Abdominal: Soft, NT, ND, bowel sounds + Extremities: no edema, no cyanosis    The results of significant diagnostics from this hospitalization (including imaging, microbiology, ancillary and laboratory) are listed below for reference.     Microbiology: Recent Results (from the past 240 hour(s))  Blood Culture (routine x 2)     Status: Abnormal   Collection Time: 01/21/17  1:27 AM  Result Value Ref Range Status   Specimen Description BLOOD RIGHT CHEST PORTA CATH  Final   Special Requests   Final    BOTTLES DRAWN AEROBIC AND ANAEROBIC Blood Culture adequate volume   Culture  Setup Time   Final    GRAM NEGATIVE RODS IN BOTH AEROBIC AND ANAEROBIC BOTTLES CRITICAL RESULT CALLED TO, READ BACK BY AND VERIFIED WITH: E. WILLIAMSON, RPHARMD ([place]) AT 1930 ON 01/21/17 BY C. JESSUP, MLT.    Culture (A)  Final    KLEBSIELLA PNEUMONIAE SUSCEPTIBILITIES PERFORMED ON PREVIOUS CULTURE WITHIN THE LAST 5 DAYS. Performed at [facility], [place] [address], [place], [place] [address]    Report Status 01/23/2017 FINAL  Final  Blood Culture (routine x 2)     Status: Abnormal   Collection Time: 01/21/17  1:50 AM  Result Value Ref Range Status   Specimen Description BLOOD BLOOD RIGHT FOREARM  Final   Special Requests   Final    BOTTLES DRAWN AEROBIC AND ANAEROBIC Blood Culture adequate volume   Culture  Setup Time   Final    GRAM NEGATIVE RODS IN BOTH AEROBIC AND ANAEROBIC BOTTLES CRITICAL RESULT CALLED TO, READ BACK BY AND VERIFIED WITH: E. WILLIAMSNO, [place] ([facility]) AT 1930 ON 01/21/17 BY C. JESSUP, MLT. Performed at [facility], [place] [address], [place], [place] [address]    Culture KLEBSIELLA SPECIES (A)  Final   Report Status 01/24/2017 FINAL  Final   Organism ID, Bacteria KLEBSIELLA SPECIES  Final      Susceptibility   Klebsiella species - MIC*    AMPICILLIN >=32 RESISTANT Resistant     CEFAZOLIN <=4 SENSITIVE Sensitive     CEFEPIME <=1 SENSITIVE Sensitive     CEFTAZIDIME <=1 SENSITIVE Sensitive     CEFTRIAXONE <=1 SENSITIVE Sensitive     CIPROFLOXACIN <=0.25 SENSITIVE Sensitive     GENTAMICIN <=1 SENSITIVE Sensitive     IMIPENEM <=0.25 SENSITIVE Sensitive     TRIMETH/SULFA <=20 SENSITIVE Sensitive     AMPICILLIN/SULBACTAM 4 SENSITIVE Sensitive     PIP/TAZO <=4 SENSITIVE Sensitive     * KLEBSIELLA SPECIES  Blood Culture ID Panel (Reflexed)     Status: Abnormal   Collection Time: 01/21/17  1:50 AM  Result Value Ref Range Status   Enterococcus species NOT DETECTED NOT DETECTED Final   Listeria  monocytogenes NOT DETECTED NOT DETECTED Final   Staphylococcus species NOT DETECTED NOT DETECTED Final   Staphylococcus aureus NOT DETECTED NOT DETECTED Final   Streptococcus species NOT DETECTED NOT DETECTED Final   Streptococcus agalactiae NOT DETECTED NOT DETECTED Final   Streptococcus pneumoniae NOT DETECTED NOT DETECTED Final   Streptococcus pyogenes NOT DETECTED NOT DETECTED Final   Acinetobacter baumannii NOT DETECTED NOT  DETECTED Final   Enterobacteriaceae species DETECTED (A) NOT DETECTED Final    Comment: Enterobacteriaceae represent a large family of gram-negative bacteria, not a single organism. CRITICAL RESULT CALLED TO, READ BACK BY AND VERIFIED WITH: E. WILLIAMSON, RPHARMD ([facility]) AT 1930 ON 01/21/17 BY C. JESSUP, MLT.    Enterobacter cloacae complex NOT DETECTED NOT DETECTED Final   Escherichia coli NOT DETECTED NOT DETECTED Final   Klebsiella oxytoca NOT DETECTED NOT DETECTED Final   Klebsiella pneumoniae DETECTED (A) NOT DETECTED Final    Comment: CRITICAL RESULT CALLED TO, READ BACK BY AND VERIFIED WITH: E. WILLIAMSON, RPHARMD ([facility]) AT 1930 ON 01/21/17 BY C. JESSUP, MLT.    Proteus species NOT DETECTED NOT DETECTED Final   Serratia marcescens NOT DETECTED NOT DETECTED Final   Carbapenem resistance NOT DETECTED NOT DETECTED Final   Haemophilus influenzae NOT DETECTED NOT DETECTED Final   Neisseria meningitidis NOT DETECTED NOT DETECTED Final   Pseudomonas aeruginosa NOT DETECTED NOT DETECTED Final   Candida albicans NOT DETECTED NOT DETECTED Final   Candida glabrata NOT DETECTED NOT DETECTED Final   Candida krusei NOT DETECTED NOT DETECTED Final   Candida parapsilosis NOT DETECTED NOT DETECTED Final   Candida tropicalis NOT DETECTED NOT DETECTED Final    Comment: Performed at [facility], [place] [address], [place], [place] [address]  MRSA PCR Screening     Status: None   Collection Time: 01/21/17 10:44 AM  Result Value Ref Range Status   MRSA by PCR NEGATIVE NEGATIVE Final    Comment:        The GeneXpert MRSA Assay (FDA approved for NASAL specimens only), is one component of a comprehensive MRSA colonization surveillance program. It is not intended to diagnose MRSA infection nor to guide or monitor treatment for MRSA infections.   Culture, blood (routine x 2)     Status: None (Preliminary result)   Collection Time: 01/23/17  5:16 AM  Result Value Ref Range Status   Specimen  Description LEFT ANTECUBITAL  Final   Special Requests IN PEDIATRIC BOTTLE Blood Culture adequate volume  Final   Culture   Final    NO GROWTH 3 DAYS Performed at [facility], [place] [address], [place], [place] [address]    Report Status PENDING  Incomplete  Culture, blood (routine x 2)     Status: None (Preliminary result)   Collection Time: 01/23/17  5:16 AM  Result Value Ref Range Status   Specimen Description BLOOD LEFT HAND  Final   Special Requests IN PEDIATRIC BOTTLE Blood Culture adequate volume  Final   Culture   Final    NO GROWTH 3 DAYS Performed at [facility], [place] [address], [place], [place] [address]    Report Status PENDING  Incomplete     Labs: BNP (last 3 results) No results for input(s): BNP in the last 8760 hours. Basic Metabolic Panel: Recent Labs  Lab 01/21/17 1500 01/22/17 0601 01/23/17 0516 01/24/17 0543 01/25/17 0443 01/25/17 1433  NA 138 139 142 139 139  --   K 3.7 4.0 3.4* 2.9*  2.7* 3.2*  CL 108 107 109 108 110  --   CO2 23 22 23 22  21*  --   GLUCOSE 119* 111* 144* 109* 144*  --   BUN 20 30* 33* 27* 25*  --   CREATININE 1.21 2.10* 2.01* 1.36* 1.09  --   CALCIUM 7.8* 8.3* 8.5* 7.8* 8.0*  --   MG  --   --  1.7 1.5*  --   --    Liver Function Tests: Recent Labs  Lab 01/21/17 1500 01/22/17 0601 01/23/17 0516 01/24/17 0543 01/25/17 0443  AST 109* 61* 51* 45* 33  ALT 104* 83* 69* 48 37  ALKPHOS 249* 239* 234* 228* 250*  BILITOT 4.3* 4.1* 2.5* 1.2 1.1  PROT 5.1* 5.5* 6.1* 5.1* 4.9*  ALBUMIN 2.3* 2.3* 2.5* 2.0* 2.0*   No results for input(s): LIPASE, AMYLASE in the last 168 hours. No results for input(s): AMMONIA in the last 168 hours. CBC: Recent Labs  Lab 01/21/17 0129 01/22/17 0601 01/23/17 0516 01/24/17 0543 01/25/17 0443  WBC 7.7 6.5 5.0 4.1 5.2  NEUTROABS 6.8  --  4.6  --   --   HGB 13.8 12.1* 13.7 11.1* 11.6*  HCT 40.9 36.6* 40.6 33.7* 35.4*  MCV 89.3 89.5 90.0 88.7 88.5  PLT 135* 104* 91* 79* 87*   Cardiac  Enzymes: Recent Labs  Lab 01/21/17 0129 01/21/17 0917 01/21/17 1500 01/22/17 0601  TROPONINI 0.13* 0.60* 0.68* 0.69*   BNP: Invalid input(s): POCBNP CBG: Recent Labs  Lab 01/24/17 1715 01/24/17 2120 01/25/17 0744 01/25/17 1148 01/25/17 1627  GLUCAP 108* 104* 109* 116* 106*   D-Dimer No results for input(s): DDIMER in the last 72 hours. Hgb A1c No results for input(s): HGBA1C in the last 72 hours. Lipid Profile No results for input(s): CHOL, HDL, LDLCALC, TRIG, CHOLHDL, LDLDIRECT in the last 72 hours. Thyroid function studies No results for input(s): TSH, T4TOTAL, T3FREE, THYROIDAB in the last 72 hours.  Invalid input(s): FREET3 Anemia work up No results for input(s): VITAMINB12, FOLATE, FERRITIN, TIBC, IRON, RETICCTPCT in the last 72 hours. Urinalysis    Component Value Date/Time   COLORURINE AMBER (A) 01/21/2017 0540   APPEARANCEUR HAZY (A) 01/21/2017 0540   LABSPEC 1.020 01/21/2017 0540   PHURINE 5.0 01/21/2017 0540   GLUCOSEU 50 (A) 01/21/2017 0540   HGBUR NEGATIVE 01/21/2017 0540   BILIRUBINUR SMALL (A) 01/21/2017 0540   KETONESUR 5 (A) 01/21/2017 0540   PROTEINUR 100 (A) 01/21/2017 0540   UROBILINOGEN 1.0 11/14/2014 0920   NITRITE NEGATIVE 01/21/2017 0540   LEUKOCYTESUR NEGATIVE 01/21/2017 0540   Sepsis Labs Invalid input(s): PROCALCITONIN,  WBC,  LACTICIDVEN Microbiology Recent Results (from the past 240 hour(s))  Blood Culture (routine x 2)     Status: Abnormal   Collection Time: 01/21/17  1:27 AM  Result Value Ref Range Status   Specimen Description BLOOD RIGHT CHEST PORTA CATH  Final   Special Requests   Final    BOTTLES DRAWN AEROBIC AND ANAEROBIC Blood Culture adequate volume   Culture  Setup Time   Final    GRAM NEGATIVE RODS IN BOTH AEROBIC AND ANAEROBIC BOTTLES CRITICAL RESULT CALLED TO, READ BACK BY AND VERIFIED WITH: E. WILLIAMSON, RPHARMD ([facility]) AT 1930 ON 01/21/17 BY C. JESSUP, MLT.    Culture (A)  Final    KLEBSIELLA  PNEUMONIAE SUSCEPTIBILITIES PERFORMED ON PREVIOUS CULTURE WITHIN THE LAST 5 DAYS. Performed at [facility], [place] [address] [place], [place] [address]    Report Status  01/23/2017 FINAL  Final  Blood Culture (routine x 2)     Status: Abnormal   Collection Time: 01/21/17  1:50 AM  Result Value Ref Range Status   Specimen Description BLOOD BLOOD RIGHT FOREARM  Final   Special Requests   Final    BOTTLES DRAWN AEROBIC AND ANAEROBIC Blood Culture adequate volume   Culture  Setup Time   Final    GRAM NEGATIVE RODS IN BOTH AEROBIC AND ANAEROBIC BOTTLES CRITICAL RESULT CALLED TO, READ BACK BY AND VERIFIED WITH: E. WILLIAMSNO, RPHARMD ([facility]) AT 1930 ON 01/21/17 BY C. JESSUP, MLT. Performed at [facility], [place] [address], [place], [place] [address]    Culture KLEBSIELLA SPECIES (A)  Final   Report Status 01/24/2017 FINAL  Final   Organism ID, Bacteria KLEBSIELLA SPECIES  Final      Susceptibility   Klebsiella species - MIC*    AMPICILLIN >=32 RESISTANT Resistant     CEFAZOLIN <=4 SENSITIVE Sensitive     CEFEPIME <=1 SENSITIVE Sensitive     CEFTAZIDIME <=1 SENSITIVE Sensitive     CEFTRIAXONE <=1 SENSITIVE Sensitive     CIPROFLOXACIN <=0.25 SENSITIVE Sensitive     GENTAMICIN <=1 SENSITIVE Sensitive     IMIPENEM <=0.25 SENSITIVE Sensitive     TRIMETH/SULFA <=20 SENSITIVE Sensitive     AMPICILLIN/SULBACTAM 4 SENSITIVE Sensitive     PIP/TAZO <=4 SENSITIVE Sensitive     * KLEBSIELLA SPECIES  Blood Culture ID Panel (Reflexed)     Status: Abnormal   Collection Time: 01/21/17  1:50 AM  Result Value Ref Range Status   Enterococcus species NOT DETECTED NOT DETECTED Final   Listeria monocytogenes NOT DETECTED NOT DETECTED Final   Staphylococcus species NOT DETECTED NOT DETECTED Final   Staphylococcus aureus NOT DETECTED NOT DETECTED Final   Streptococcus species NOT DETECTED NOT DETECTED Final   Streptococcus agalactiae NOT DETECTED NOT DETECTED Final   Streptococcus pneumoniae NOT  DETECTED NOT DETECTED Final   Streptococcus pyogenes NOT DETECTED NOT DETECTED Final   Acinetobacter baumannii NOT DETECTED NOT DETECTED Final   Enterobacteriaceae species DETECTED (A) NOT DETECTED Final    Comment: Enterobacteriaceae represent a large family of gram-negative bacteria, not a single organism. CRITICAL RESULT CALLED TO, READ BACK BY AND VERIFIED WITH: E. WILLIAMSON, RPHARMD ([facility]) AT 1930 ON 01/21/17 BY C. JESSUP, MLT.    Enterobacter cloacae complex NOT DETECTED NOT DETECTED Final   Escherichia coli NOT DETECTED NOT DETECTED Final   Klebsiella oxytoca NOT DETECTED NOT DETECTED Final   Klebsiella pneumoniae DETECTED (A) NOT DETECTED Final    Comment: CRITICAL RESULT CALLED TO, READ BACK BY AND VERIFIED WITH: E. WILLIAMSON, RPHARMD ([facility]) AT 1930 ON 01/21/17 BY C. JESSUP, MLT.    Proteus species NOT DETECTED NOT DETECTED Final   Serratia marcescens NOT DETECTED NOT DETECTED Final   Carbapenem resistance NOT DETECTED NOT DETECTED Final   Haemophilus influenzae NOT DETECTED NOT DETECTED Final   Neisseria meningitidis NOT DETECTED NOT DETECTED Final   Pseudomonas aeruginosa NOT DETECTED NOT DETECTED Final   Candida albicans NOT DETECTED NOT DETECTED Final   Candida glabrata NOT DETECTED NOT DETECTED Final   Candida krusei NOT DETECTED NOT DETECTED Final   Candida parapsilosis NOT DETECTED NOT DETECTED Final   Candida tropicalis NOT DETECTED NOT DETECTED Final    Comment: Performed at [facility], [place] [address], [place], [place] [address]  MRSA PCR Screening     Status: None   Collection Time: 01/21/17 10:44 AM  Result Value Ref Range Status   MRSA by PCR NEGATIVE NEGATIVE Final    Comment:        The GeneXpert MRSA Assay (FDA approved for NASAL specimens only), is one component of a comprehensive MRSA colonization surveillance program. It is not intended to diagnose MRSA infection nor to guide or monitor treatment for MRSA infections.   Culture, blood (routine  x 2)     Status: None (Preliminary result)   Collection Time: 01/23/17  5:16 AM  Result Value Ref Range Status   Specimen Description LEFT ANTECUBITAL  Final   Special Requests IN PEDIATRIC BOTTLE Blood Culture adequate volume  Final   Culture   Final    NO GROWTH 3 DAYS Performed at [facility], [place] [address], [place], [place] [address]    Report Status PENDING  Incomplete  Culture, blood (routine x 2)     Status: None (Preliminary result)   Collection Time: 01/23/17  5:16 AM  Result Value Ref Range Status   Specimen Description BLOOD LEFT HAND  Final   Special Requests IN PEDIATRIC BOTTLE Blood Culture adequate volume  Final   Culture   Final    NO GROWTH 3 DAYS Performed at [facility], [place] [address], [place], [place] [address]    Report Status PENDING  Incomplete     Time coordinating discharge: Over 30 minutes  SIGNED:   Hosie Poisson, MD  [facility] 01/26/2017, 8:06 PM Pager   If 7PM-7AM, please contact night-coverage [email or web address] Password [email or web address]

## 2017-01-28 LAB — CULTURE, BLOOD (ROUTINE X 2)
CULTURE: NO GROWTH
Culture: NO GROWTH
SPECIAL REQUESTS: ADEQUATE
Special Requests: ADEQUATE

## 2017-01-31 ENCOUNTER — Other Ambulatory Visit: Payer: Self-pay | Admitting: Hematology and Oncology

## 2017-01-31 ENCOUNTER — Other Ambulatory Visit: Payer: Medicare Other

## 2017-01-31 ENCOUNTER — Ambulatory Visit: Payer: Medicare Other

## 2017-01-31 ENCOUNTER — Ambulatory Visit: Payer: Medicare Other | Admitting: Hematology and Oncology

## 2017-02-07 ENCOUNTER — Ambulatory Visit (HOSPITAL_BASED_OUTPATIENT_CLINIC_OR_DEPARTMENT_OTHER): Payer: Medicare Other

## 2017-02-07 ENCOUNTER — Ambulatory Visit: Payer: Medicare Other | Admitting: Nutrition

## 2017-02-07 ENCOUNTER — Other Ambulatory Visit (HOSPITAL_BASED_OUTPATIENT_CLINIC_OR_DEPARTMENT_OTHER): Payer: Medicare Other

## 2017-02-07 ENCOUNTER — Telehealth: Payer: Self-pay | Admitting: Hematology

## 2017-02-07 VITALS — BP 104/62 | HR 80 | Temp 98.8°F | Resp 14

## 2017-02-07 DIAGNOSIS — Z5111 Encounter for antineoplastic chemotherapy: Secondary | ICD-10-CM

## 2017-02-07 DIAGNOSIS — C787 Secondary malignant neoplasm of liver and intrahepatic bile duct: Secondary | ICD-10-CM | POA: Diagnosis not present

## 2017-02-07 DIAGNOSIS — C01 Malignant neoplasm of base of tongue: Secondary | ICD-10-CM

## 2017-02-07 DIAGNOSIS — C770 Secondary and unspecified malignant neoplasm of lymph nodes of head, face and neck: Secondary | ICD-10-CM | POA: Diagnosis not present

## 2017-02-07 DIAGNOSIS — C029 Malignant neoplasm of tongue, unspecified: Secondary | ICD-10-CM

## 2017-02-07 LAB — CBC WITH DIFFERENTIAL/PLATELET
BASO%: 0.4 % (ref 0.0–2.0)
Basophils Absolute: 0 10*3/uL (ref 0.0–0.1)
EOS%: 1.9 % (ref 0.0–7.0)
Eosinophils Absolute: 0.1 10*3/uL (ref 0.0–0.5)
HEMATOCRIT: 38.8 % (ref 38.4–49.9)
HEMOGLOBIN: 12.5 g/dL — AB (ref 13.0–17.1)
LYMPH#: 0.4 10*3/uL — AB (ref 0.9–3.3)
LYMPH%: 6 % — ABNORMAL LOW (ref 14.0–49.0)
MCH: 29.7 pg (ref 27.2–33.4)
MCHC: 32.2 g/dL (ref 32.0–36.0)
MCV: 92.2 fL (ref 79.3–98.0)
MONO#: 0.4 10*3/uL (ref 0.1–0.9)
MONO%: 5.1 % (ref 0.0–14.0)
NEUT%: 86.6 % — AB (ref 39.0–75.0)
NEUTROS ABS: 5.9 10*3/uL (ref 1.5–6.5)
PLATELETS: 210 10*3/uL (ref 140–400)
RBC: 4.21 10*6/uL (ref 4.20–5.82)
RDW: 14.8 % — AB (ref 11.0–14.6)
WBC: 6.8 10*3/uL (ref 4.0–10.3)

## 2017-02-07 LAB — COMPREHENSIVE METABOLIC PANEL
ALBUMIN: 3.2 g/dL — AB (ref 3.5–5.0)
ALT: 17 U/L (ref 0–55)
ANION GAP: 9 meq/L (ref 3–11)
AST: 16 U/L (ref 5–34)
Alkaline Phosphatase: 144 U/L (ref 40–150)
BILIRUBIN TOTAL: 0.7 mg/dL (ref 0.20–1.20)
BUN: 17.8 mg/dL (ref 7.0–26.0)
CALCIUM: 9.4 mg/dL (ref 8.4–10.4)
CO2: 25 mEq/L (ref 22–29)
CREATININE: 1 mg/dL (ref 0.7–1.3)
Chloride: 106 mEq/L (ref 98–109)
EGFR: >60 mL/min/{1.73_m2} (ref >60)
Glucose: 118 mg/dl (ref 70–140)
Potassium: 3.9 mEq/L (ref 3.5–5.1)
Sodium: 140 mEq/L (ref 136–145)
TOTAL PROTEIN: 6.7 g/dL (ref 6.4–8.3)

## 2017-02-07 MED ORDER — DIPHENHYDRAMINE HCL 50 MG/ML IJ SOLN
50.0000 mg | Freq: Once | INTRAMUSCULAR | Status: AC
  Administered 2017-02-07: 50 mg via INTRAVENOUS

## 2017-02-07 MED ORDER — SODIUM CHLORIDE 0.9 % IV SOLN
20.0000 mg | Freq: Once | INTRAVENOUS | Status: AC
  Administered 2017-02-07: 20 mg via INTRAVENOUS
  Filled 2017-02-07: qty 2

## 2017-02-07 MED ORDER — SODIUM CHLORIDE 0.9 % IV SOLN
200.0000 mg | Freq: Once | INTRAVENOUS | Status: AC
  Administered 2017-02-07: 200 mg via INTRAVENOUS
  Filled 2017-02-07: qty 20

## 2017-02-07 MED ORDER — DIPHENHYDRAMINE HCL 50 MG/ML IJ SOLN
INTRAMUSCULAR | Status: AC
  Filled 2017-02-07: qty 1

## 2017-02-07 MED ORDER — SODIUM CHLORIDE 0.9 % IV SOLN
Freq: Once | INTRAVENOUS | Status: AC
  Administered 2017-02-07: 09:00:00 via INTRAVENOUS

## 2017-02-07 MED ORDER — FAMOTIDINE IN NACL 20-0.9 MG/50ML-% IV SOLN
INTRAVENOUS | Status: AC
  Filled 2017-02-07: qty 50

## 2017-02-07 MED ORDER — HEPARIN SOD (PORK) LOCK FLUSH 100 UNIT/ML IV SOLN
500.0000 [IU] | Freq: Once | INTRAVENOUS | Status: AC | PRN
  Administered 2017-02-07: 500 [IU]
  Filled 2017-02-07: qty 5

## 2017-02-07 MED ORDER — SODIUM CHLORIDE 0.9 % IV SOLN
45.0000 mg/m2 | Freq: Once | INTRAVENOUS | Status: AC
  Administered 2017-02-07: 90 mg via INTRAVENOUS
  Filled 2017-02-07: qty 15

## 2017-02-07 MED ORDER — PALONOSETRON HCL INJECTION 0.25 MG/5ML
0.2500 mg | Freq: Once | INTRAVENOUS | Status: AC
  Administered 2017-02-07: 0.25 mg via INTRAVENOUS

## 2017-02-07 MED ORDER — PALONOSETRON HCL INJECTION 0.25 MG/5ML
INTRAVENOUS | Status: AC
  Filled 2017-02-07: qty 5

## 2017-02-07 MED ORDER — FAMOTIDINE IN NACL 20-0.9 MG/50ML-% IV SOLN
20.0000 mg | Freq: Once | INTRAVENOUS | Status: AC
  Administered 2017-02-07: 20 mg via INTRAVENOUS

## 2017-02-07 MED ORDER — SODIUM CHLORIDE 0.9% FLUSH
10.0000 mL | INTRAVENOUS | Status: DC | PRN
  Administered 2017-02-07: 10 mL
  Filled 2017-02-07: qty 10

## 2017-02-07 NOTE — Patient Instructions (Signed)
[facility] Discharge Instructions for Patients Receiving Chemotherapy  Today you received the following chemotherapy agents:  Taxol, Carboplatin  To help prevent nausea and vomiting after your treatment, we encourage you to take your nausea medication as prescribed.   If you develop nausea and vomiting that is not controlled by your nausea medication, call the clinic.   BELOW ARE SYMPTOMS THAT SHOULD BE REPORTED IMMEDIATELY:  *FEVER GREATER THAN 100.5 F  *CHILLS WITH OR WITHOUT FEVER  NAUSEA AND VOMITING THAT IS NOT CONTROLLED WITH YOUR NAUSEA MEDICATION  *UNUSUAL SHORTNESS OF BREATH  *UNUSUAL BRUISING OR BLEEDING  TENDERNESS IN MOUTH AND THROAT WITH OR WITHOUT PRESENCE OF ULCERS  *URINARY PROBLEMS  *BOWEL PROBLEMS  UNUSUAL RASH Items with * indicate a potential emergency and should be followed up as soon as possible.  Feel free to call the clinic should you have any questions or concerns. The clinic phone number is [phone].  Please show the CHEMO ALERT CARD at check-in to the Emergency Department and triage nurse.   

## 2017-02-07 NOTE — Progress Notes (Signed)
Nutrition follow-up completed with patient during infusion. Patient is frustrated that chemotherapy is taking too much time today. He denies nutrition impact symptoms from current chemotherapy. Reports drinking boost plus. Last weight documented was 175 pounds, December 15.  Nutrition diagnosis: Inadequate oral intake, ongoing.  Intervention:  Provided support and encouragement for patient to consume high-calorie foods and high-protein foods throughout the day. Encouraged him to strive for weight maintenance. Teach back method used.  Monitoring, evaluation, goals: Patient will tolerate adequate calories and protein to maintain her weight.  Next visit be scheduled as needed.  **Disclaimer: This note was dictated with voice recognition software. Similar sounding words can inadvertently be transcribed and this note may contain transcription errors which may not have been corrected upon publication of note.**

## 2017-02-07 NOTE — Progress Notes (Signed)
Dr. Burr Medico saw pt in infusion room today.  OK to proceed with chemo as ordered per Dr. Burr Medico.

## 2017-02-07 NOTE — Telephone Encounter (Signed)
I was called by [name] to evaluate him in the infusion room due to his recent hospitalization.  He was admitted on January 21, 2017 for sepsis and bacteremia.  He was seen by his primary oncologist Dr. Alvy Bimler during the hospital stay.  He was discharged home with antibiotics, which he completed 3 days ago.  He has been afebrile, feels well overall, still has persistent dry cough, which has been going on since the hospital stay.  His chest x-ray on admission was negative.  Lab reviewed, adequate for treatment.  I approved his chemo today.  He is scheduled to see Dr. Alvy Bimler next week.  Truitt Merle  02/07/2017

## 2017-02-11 ENCOUNTER — Telehealth: Payer: Self-pay

## 2017-02-11 ENCOUNTER — Telehealth: Payer: Self-pay | Admitting: Cardiology

## 2017-02-11 NOTE — Telephone Encounter (Signed)
New Message  Pt c/o BP issue: STAT if pt c/o blurred vision, one-sided weakness or slurred speech  1. What are your last 5 BP readings? 94/78  HR 60 sitting regular  2. Are you having any other symptoms (ex. Dizziness, headache, blurred vision, passed out)? no  3. What is your BP issue? Benjamin Barr from [facility] call to report low bp

## 2017-02-11 NOTE — Telephone Encounter (Signed)
Returned call to [employer] no answer.[place]. Spoke to patient's wife Dr.Jordan's advice given.Stated she would monitor husband's B/P and give metoprolol 25 mg 1/2 tablet twice a day if B/P above 102 systolic.Appointment scheduled with Dr.Jordan 02/17/17 at 9:40 am.Advised to bring B/P diary to appointment.

## 2017-02-11 NOTE — Telephone Encounter (Signed)
Returned call to [employer] with [facility].She was calling to report patient's B/P this morning 94/78 pulse 60.She wanted to know if ok for patient to hold metoprolol.He will hold morning dose. He takes 25 mg 1/2 tablet twice a day.Stated patient is taking chemo therapy.Advised I will send message to [place] for advice.

## 2017-02-11 NOTE — Telephone Encounter (Signed)
Spoke to [employer] with [facility] Dr.Jordan's recommendations given.Advised I already spoke to patient's wife.

## 2017-02-11 NOTE — Telephone Encounter (Signed)
Ok to hold metoprolol this am. I would resume prior dose if BP improves.  Namon Villarin [name] MD, [address] [facility]

## 2017-02-12 NOTE — Progress Notes (Signed)
Cardiology Office Note   Date:  02/17/2017   ID:  Draysen, Weygandt 11/27/1945, MRN [id number]  PCP:  Aura Dials, MD  Cardiologist:  Dr. Charlet Harr [name]     Chief Complaint  Patient presents with  . Coronary Artery Disease      History of Present Illness: Benjamin Barr is a 72 y.o. male who presents for follow up CAD.  He has w/ PMH of CAD w/ Taxus DES to LCx & RCA in 2008, rheumatoid arthritis (& new Dx of PMR), type II DM, prostate CA.He presented in October 2016 with progressive angina.  He had stenting of the ostial LCx, with DES, stenting of mRCA with DES and POBA of the PDA. The prior stents were still patent.   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 He received RT and a gastroscopy feeding tube. In February scans revealed new hepatic metastases. A PortAcath was placed.  He was started on chemotherapy with cetuximab but suffered a cardiac arrest related to an anaphylactic reaction to chemo. During arrest he suffered multiple rib fractures due to CPR. He had transient Afib (after receiving IV epinephrine) that converted to NSR. He was started on low dose metoprolol. Discharged 04/06/26.   On his follow up with oncology in August it was noted that PET scan showed persistent metastatic disease in the caudate lobe of the liver but this had not grown. Noted patient had stopped a lot of his medications including statin and ASA and had acquired lymphedema in neck.  He was admitted in December 2018 with fever, tachycardia, weakness and ARF. Diagnosed with Klebsiella bacteremia. Initially concerned this was gallbladder related but MRCP negative and HIDA scan negative for obstruction. He had elevated troponin felt to be related to demand ischemia. EF 55% by Echo with inferior and inferolateral HK. plavix resumed and metoprolol added.   On follow up today he is feeling better. His  appetite is still poor. He has lost weight. Just started on steroids this week. Notes blood sugar running low. Still on insulin. HR consistently in the 40s despite reduction in meroporolol dose.    Past Medical History:  Diagnosis Date  . Anemia   . CAD (coronary artery disease)    a. 2008 PCI: Taxus DES  to mRCA, prox & mid LCFX;  b. 10/2014 MV: extensive ischemia in the RCA territory and small area of anterior ischemia;  c. 11/2014 PCI: LM nl, LAD 30ost/prox, 80d, LCX 90ost (3.0x12 Promus Prem DES), OM2 30, RCA 81m (3.0x12 Promus Prem DES), 30m, RPDA 90 (PTCA), EF 55-65%.  . Cancer ([place])    Head & neck  . Diverticulosis of colon   . History of colon polyps    2014--  hyperplastic, adenoma, and benign polyps  . History of radiation therapy 10/01/15- 11/20/15   Oropharynx/base of neck 70 Gy in 35 fractions  . Hyperlipidemia   . Hypertension   . Myocardial infarction ([place])   . 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 Obesity   . Peripheral neuropathy   . Pneumonia    hx  . Prostate cancer ([place])   . RBBB (right bundle branch block)   . Skin cancer   . Type 2 diabetes mellitus ([place])     Past Surgical History:  Procedure Laterality Date  . APPENDECTOMY    . CARDIAC CATHETERIZATION N/A 11/13/2014   Procedure: Left Heart Cath and  Coronary Angiography;  Surgeon: Ravin Denardo M [name], MD;  Location: [place] [facility];  Service: Cardiovascular;  Laterality: N/A;  . CARDIAC CATHETERIZATION  11/13/2014   Procedure: Coronary Stent Intervention;  Surgeon: Akeema Broder M [name], MD;  Location: [place] [facility];  Service: Cardiovascular;;  . CARDIOVASCULAR STRESS TEST  01-17-2012  dr [name]   Normal perfusion study/ no ischemia or infarction/  normal LVF and wall motion, ef 54%  . CORONARY ANGIOPLASTY WITH STENT PLACEMENT  05/04/2006   dr [name]   Severe 2-vessel obstructive CAD/  normal LVF/  PCI with DES to proxima and mid CFX and mRCA (total 3 DES)  . CRYOABLATION N/A 04/01/2014   Procedure: CRYO  ABLATION PROSTATE;  Surgeon: [name], MD;  Location: [facility];  Service: Urology;  Laterality: N/A;  . DIRECT LARYNGOSCOPY N/A 09/19/2015   Procedure: DIRECT LARYNGOSCOPY;  Surgeon: Izora Gala, MD;  Location: [place];  Service: ENT;  Laterality: N/A;  . ESOPHAGOSCOPY N/A 09/19/2015   Procedure: ESOPHAGOSCOPY;  Surgeon: Izora Gala, MD;  Location: [place];  Service: ENT;  Laterality: N/A;  . FLOOR OF MOUTH BIOPSY N/A 09/19/2015   Procedure: FLOOR OF MOUTH BIOPSY;  Surgeon: Izora Gala, MD;  Location: [facility];  Service: ENT;  Laterality: N/A;  . INSERTION OF SUPRAPUBIC CATHETER N/A 04/01/2014   Procedure: INSERTION OF SUPRAPUBIC CATHETER;  Surgeon: [name], MD;  Location: [facility];  Service: Urology;  Laterality: N/A;  suprapubic   . IR GASTROSTOMY TUBE REMOVAL  11/17/2016  . IR GENERIC HISTORICAL  11/13/2015   IR GASTROSTOMY TUBE MOD SED 11/13/2015 [name], DO [facility]  . IR GENERIC HISTORICAL  03/26/2016   IR US GUIDE VASC ACCESS RIGHT 03/26/2016 [name], MD [facility]  . IR GENERIC HISTORICAL  03/26/2016   IR FLUORO GUIDE PORT INSERTION RIGHT 03/26/2016 [name], MD [facility]  . MULTIPLE TOOTH EXTRACTIONS  09/17/2015  . PARTIAL COLECTOMY  80's     Current Outpatient Medications  Medication Sig Dispense Refill  . clopidogrel (PLAVIX) 75 MG tablet TAKE 1 TABLET EVERY DAY 90 tablet 2  . insulin glargine (LANTUS) 100 UNIT/ML injection Inject 0.5 mLs (50 Units total) into the skin at bedtime. 10 mL   . mirtazapine (REMERON) 30 MG tablet Take 1 tablet (30 mg total) by mouth at bedtime. 90 tablet 1  . Multiple Vitamin (MULTIVITAMIN WITH MINERALS) TABS tablet Take 1 tablet by mouth daily. 30 tablet 0  . ondansetron (ZOFRAN) 8 MG tablet Take 1 tablet (8 mg total) by mouth every 8 (eight) hours as needed for nausea or vomiting. 60 tablet 11  . polyethylene glycol (MIRALAX / GLYCOLAX) packet Take 17 g by mouth daily as  needed. 14 each 0  . predniSONE (DELTASONE) 20 MG tablet Take 1 tablet (20 mg total) by mouth daily with breakfast. 30 tablet 1   No current facility-administered medications for this visit.     Allergies:   Cetuximab    Social History:  The patient  reports that  has never smoked. he has never used smokeless tobacco. He reports that he drinks alcohol. He reports that he does not use drugs.   Family History:  The patient's family history includes Cancer in his father; Diverticulitis in his mother; Heart failure in his father.    ROS:  As noted in HPI. All other systems are reviewed and are negative.  Wt Readings from Last 3 Encounters:  02/17/17 159 lb 6.4 oz (  72.3 kg)  02/15/17 157 lb (71.2 kg)  01/22/17 175 lb 11.3 oz (79.7 kg)     PHYSICAL EXAM: VS:  BP 100/62 (BP Location: Left Arm, Patient Position: Sitting, Cuff Size: Normal)   Pulse 74   Ht 5\' 8"  (1.727 m)   Wt 159 lb 6.4 oz (72.3 kg)   BMI 24.24 kg/m  , BMI Body mass index is 24.24 kg/m. GENERAL:  Chronically ill appearing WM in NAD HEENT:  PERRL, EOMI, sclera are clear. Oropharynx is clear. NECK:  No jugular venous distention, carotid upstroke brisk and symmetric, no bruits, no thyromegaly or adenopathy LUNGS:  Clear to auscultation bilaterally CHEST:  Unremarkable HEART:  RRR,  PMI not displaced or sustained,S1 and S2 within normal limits, no S3, no S4: no clicks, no rubs, no murmurs ABD:  Soft, nontender. BS +, no masses or bruits. No hepatomegaly, no splenomegaly EXT:  2 + pulses throughout, no edema, no cyanosis no clubbing SKIN:  Warm and dry.  No rashes NEURO:  Alert and oriented x 3. Cranial nerves II through XII intact. PSYCH:  Cognitively intact  Recent Labs: 01/24/2017: Magnesium 1.5 02/15/2017: ALT 18; BUN 22; Creatinine, Ser 1.03; Hemoglobin 11.8; Platelets 148; Potassium 3.7; Sodium 140; TSH 3.021  Dated 09/15/15: A1c 9.7% Dated 06/29/16: A1c 7.3%  Lipid Panel No results found for: CHOL, TRIG, HDL,  CHOLHDL, VLDL, LDLCALC, LDLDIRECT   Last lipid panel 07/26/14: cholesterol 146, triglycerides 398, LDL 34, HDL 32.   Other studies Reviewed: Additional studies/ records that were reviewed today include:   Cardiac cath October 2016: Procedures    Coronary Stent Intervention   Left Heart Cath and Coronary Angiography    Conclusion     Ost LAD to Prox LAD lesion, 30% stenosed.  Dist LAD lesion, 80% stenosed.  2nd Mrg lesion, 30% stenosed.  Ost Cx lesion, 90% stenosed.  The left ventricular systolic function is normal.  Ost Cx to Prox Cx lesion, 90% stenosed. This is proximal the the prior stent. There is a 0% residual stenosis post intervention.  RPDA lesion, 90% stenosed. There is a 10% residual stenosis post intervention.  Mid RCA-1 lesion, 90% stenosed. There is a 0% residual stenosis post intervention. A drug-eluting stent was placed. This is proximal to the prior stent.  Mid RCA-1 lesion, 90% stenosed.  A drug-eluting stent was placed.  A drug-eluting stent was placed.  1. Severe 2 vessel obstructive CAD  90% ostial LCx. Prior stent in the proximal to mid LCx is patent.  90% mid RCA proximal to prior stent. The old stent is patent  90% PDA 2. Normal LV function 3. Successful stenting of the ostial LCx with a DES 4. Successful stenting of the mid RCA with a DES  5. Successful POBA of the PDA.  Plan: continue DAPT indefinitely. Anticipate DC in am.     Echo: 05/22/14:Study Conclusions  - Left ventricle: The cavity size was normal. There was mild concentric hypertrophy. Systolic function was normal. The estimated ejection fraction was in the range of 60% to 65%. Wall motion was normal; there were no regional wall motion abnormalities. There was an increased relative contribution of atrial contraction to ventricular filling. Doppler parameters are consistent with abnormal left ventricular relaxation (grade 1 diastolic  dysfunction). - Aortic valve: Moderate diffuse thickening and calcification, consistent with sclerosis. - Mitral valve: Calcified annulus. Moderate focal calcification of the anterior leaflet. There was mild regurgitation. - Pericardium, extracardiac: A trivial pericardial effusion was identified posterior to the heart.  Echo 01/23/17: Study Conclusions  - Left ventricle: The cavity size was normal. Wall thickness was   normal. Basal inferior akinesis, basal inferolateral hypokinesis.   The estimated ejection fraction was 55%. Doppler parameters are   consistent with abnormal left ventricular relaxation (grade 1   diastolic dysfunction). - Aortic valve: Trileaflet; moderately calcified leaflets. There   was no stenosis. - Mitral valve: There was mild regurgitation. - Left atrium: The atrium was mildly dilated. - Right ventricle: The cavity size was normal. Systolic function   was normal. - Pulmonary arteries: No complete TR doppler jet so unable to   estimate PA systolic pressure. - Inferior vena cava: The vessel was normal in size. The   respirophasic diameter changes were in the normal range (>= 50%),   consistent with normal central venous pressure.  Impressions:  - Normal LV size with EF 55%. Basal inferior akinesis, basal   inferolateral severe hypokinesis. Normal RV size and systolic   function. Mild mitral regurgitation.  ASSESSMENT AND PLAN:  1.  CAD  s/p DES stents to ostial LCX, and mRCA and angioplasty to PDA in October 2016.  Recommend taking Plavix only for now.  He is off ASA. He is asymptomatic. Admission in December with Klebsiella sepsis with elevated troponin felt to be demand ischemia. Given his persistent bradycardia I have recommended he stop taking metoprolol.   2.   Hyperlipidemia- he is off statin now.   3.  Essential HTN  BP is well controlled.   4. DM followed by Dr. Buddy Duty - may need reduction in insulin given weight loss and poor po  intake.  5. History of recent Klebsiella sepsis. Resolved.   FU: 6 months.  Signed, Antavious Spanos [name], MD  02/17/2017 10:10 AM    [facility] [place], [place], [place] Phone: [phone]; Fax: [phone]

## 2017-02-14 ENCOUNTER — Telehealth: Payer: Self-pay | Admitting: Cardiology

## 2017-02-14 NOTE — Telephone Encounter (Signed)
New Message  Nicki from [place] [facility] care called requesting to speak with RN. She states at her visit she notes pts hear rate to be 48 irregular. She states pts bp was ok 118/60. She would like to know if the pt needs to take metoprolol for heart rate.

## 2017-02-14 NOTE — Telephone Encounter (Signed)
Returned call to [facility] with [place].She stated patient's heart rate 48 irreg.Has been ranging [phone].B/P 120/70,118/60.Stated patient feels ok no complaints.She wanted to know should patient hold metoprolol.Spoke to [place] he advised ok to take metoprolol as prescribed.Advised to keep appointment with him 02/17/17.

## 2017-02-15 ENCOUNTER — Inpatient Hospital Stay (HOSPITAL_BASED_OUTPATIENT_CLINIC_OR_DEPARTMENT_OTHER): Payer: Medicare Other | Admitting: Hematology and Oncology

## 2017-02-15 ENCOUNTER — Inpatient Hospital Stay: Payer: Medicare Other

## 2017-02-15 ENCOUNTER — Telehealth: Payer: Self-pay | Admitting: Hematology and Oncology

## 2017-02-15 ENCOUNTER — Other Ambulatory Visit: Payer: Self-pay | Admitting: Hematology and Oncology

## 2017-02-15 ENCOUNTER — Inpatient Hospital Stay: Payer: Medicare Other | Attending: Hematology and Oncology

## 2017-02-15 VITALS — HR 40

## 2017-02-15 DIAGNOSIS — Z5111 Encounter for antineoplastic chemotherapy: Secondary | ICD-10-CM | POA: Diagnosis not present

## 2017-02-15 DIAGNOSIS — R634 Abnormal weight loss: Secondary | ICD-10-CM | POA: Insufficient documentation

## 2017-02-15 DIAGNOSIS — C787 Secondary malignant neoplasm of liver and intrahepatic bile duct: Secondary | ICD-10-CM | POA: Diagnosis present

## 2017-02-15 DIAGNOSIS — Z9861 Coronary angioplasty status: Secondary | ICD-10-CM | POA: Insufficient documentation

## 2017-02-15 DIAGNOSIS — D61818 Other pancytopenia: Secondary | ICD-10-CM | POA: Insufficient documentation

## 2017-02-15 DIAGNOSIS — R110 Nausea: Secondary | ICD-10-CM

## 2017-02-15 DIAGNOSIS — C029 Malignant neoplasm of tongue, unspecified: Secondary | ICD-10-CM

## 2017-02-15 DIAGNOSIS — C770 Secondary and unspecified malignant neoplasm of lymph nodes of head, face and neck: Secondary | ICD-10-CM | POA: Insufficient documentation

## 2017-02-15 DIAGNOSIS — R001 Bradycardia, unspecified: Secondary | ICD-10-CM | POA: Insufficient documentation

## 2017-02-15 DIAGNOSIS — Z794 Long term (current) use of insulin: Secondary | ICD-10-CM | POA: Insufficient documentation

## 2017-02-15 DIAGNOSIS — C01 Malignant neoplasm of base of tongue: Secondary | ICD-10-CM | POA: Insufficient documentation

## 2017-02-15 DIAGNOSIS — R5383 Other fatigue: Secondary | ICD-10-CM

## 2017-02-15 DIAGNOSIS — I2510 Atherosclerotic heart disease of native coronary artery without angina pectoris: Secondary | ICD-10-CM | POA: Insufficient documentation

## 2017-02-15 DIAGNOSIS — F32A Depression, unspecified: Secondary | ICD-10-CM

## 2017-02-15 DIAGNOSIS — R432 Parageusia: Secondary | ICD-10-CM | POA: Diagnosis not present

## 2017-02-15 DIAGNOSIS — I959 Hypotension, unspecified: Secondary | ICD-10-CM | POA: Diagnosis not present

## 2017-02-15 DIAGNOSIS — F329 Major depressive disorder, single episode, unspecified: Secondary | ICD-10-CM

## 2017-02-15 DIAGNOSIS — Z7189 Other specified counseling: Secondary | ICD-10-CM | POA: Diagnosis not present

## 2017-02-15 DIAGNOSIS — E1140 Type 2 diabetes mellitus with diabetic neuropathy, unspecified: Secondary | ICD-10-CM | POA: Insufficient documentation

## 2017-02-15 LAB — CBC WITH DIFFERENTIAL/PLATELET
ABS GRANULOCYTE: 3.3 10*3/uL (ref 1.5–6.5)
BASOS ABS: 0 10*3/uL (ref 0.0–0.1)
BASOS PCT: 1 %
EOS ABS: 0.1 10*3/uL (ref 0.0–0.5)
Eosinophils Relative: 2 %
HEMATOCRIT: 35.2 % — AB (ref 38.4–49.9)
Hemoglobin: 11.8 g/dL — ABNORMAL LOW (ref 13.0–17.1)
Lymphocytes Relative: 11 %
Lymphs Abs: 0.4 10*3/uL — ABNORMAL LOW (ref 0.9–3.3)
MCH: 29.9 pg (ref 27.2–33.4)
MCHC: 33.5 g/dL (ref 32.0–36.0)
MCV: 89.1 fL (ref 79.3–98.0)
Monocytes Absolute: 0.2 10*3/uL (ref 0.1–0.9)
Monocytes Relative: 4 %
NEUTROS PCT: 82 %
Neutro Abs: 3.3 10*3/uL (ref 1.5–6.5)
Platelets: 148 10*3/uL (ref 140–400)
RBC: 3.95 MIL/uL — AB (ref 4.20–5.82)
RDW: 15.1 % (ref 11.0–15.6)
WBC: 4 10*3/uL (ref 4.0–10.3)

## 2017-02-15 LAB — COMPREHENSIVE METABOLIC PANEL
ALBUMIN: 3.3 g/dL — AB (ref 3.5–5.0)
ALK PHOS: 103 U/L (ref 40–150)
ALT: 18 U/L (ref 0–55)
ANION GAP: 7 (ref 3–11)
AST: 19 U/L (ref 5–34)
BILIRUBIN TOTAL: 0.7 mg/dL (ref 0.2–1.2)
BUN: 22 mg/dL (ref 7–26)
CALCIUM: 9.4 mg/dL (ref 8.4–10.4)
CO2: 26 mmol/L (ref 22–29)
Chloride: 107 mmol/L (ref 98–109)
Creatinine, Ser: 1.03 mg/dL (ref 0.70–1.30)
GFR calc Af Amer: >60 mL/min (ref >60)
GFR calc non Af Amer: >60 mL/min (ref >60)
GLUCOSE: 150 mg/dL — AB (ref 70–140)
Potassium: 3.7 mmol/L (ref 3.5–5.1)
SODIUM: 140 mmol/L (ref 136–145)
Total Protein: 6.3 g/dL — ABNORMAL LOW (ref 6.4–8.3)

## 2017-02-15 LAB — TSH: TSH: 3.021 u[IU]/mL (ref 0.320–4.118)

## 2017-02-15 MED ORDER — HEPARIN SOD (PORK) LOCK FLUSH 100 UNIT/ML IV SOLN
500.0000 [IU] | Freq: Once | INTRAVENOUS | Status: AC | PRN
  Administered 2017-02-15: 500 [IU]
  Filled 2017-02-15: qty 5

## 2017-02-15 MED ORDER — FAMOTIDINE IN NACL 20-0.9 MG/50ML-% IV SOLN
20.0000 mg | Freq: Once | INTRAVENOUS | Status: AC
  Administered 2017-02-15: 20 mg via INTRAVENOUS

## 2017-02-15 MED ORDER — PREDNISONE 20 MG PO TABS: 20.0000 mg | ORAL_TABLET | Freq: Every day | ORAL | 1 refills | Status: DC

## 2017-02-15 MED ORDER — PALONOSETRON HCL INJECTION 0.25 MG/5ML
0.2500 mg | Freq: Once | INTRAVENOUS | Status: AC
  Administered 2017-02-15: 0.25 mg via INTRAVENOUS

## 2017-02-15 MED ORDER — DIPHENHYDRAMINE HCL 50 MG/ML IJ SOLN
INTRAMUSCULAR | Status: AC
  Filled 2017-02-15: qty 1

## 2017-02-15 MED ORDER — SODIUM CHLORIDE 0.9 % IV SOLN
Freq: Once | INTRAVENOUS | Status: AC
  Administered 2017-02-15: 13:00:00 via INTRAVENOUS

## 2017-02-15 MED ORDER — PALONOSETRON HCL INJECTION 0.25 MG/5ML
INTRAVENOUS | Status: AC
  Filled 2017-02-15: qty 5

## 2017-02-15 MED ORDER — SODIUM CHLORIDE 0.9% FLUSH
10.0000 mL | Freq: Once | INTRAVENOUS | Status: AC
  Administered 2017-02-15: 10 mL
  Filled 2017-02-15: qty 10

## 2017-02-15 MED ORDER — SODIUM CHLORIDE 0.9 % IV SOLN
45.0000 mg/m2 | Freq: Once | INTRAVENOUS | Status: AC
  Administered 2017-02-15: 90 mg via INTRAVENOUS
  Filled 2017-02-15: qty 15

## 2017-02-15 MED ORDER — SODIUM CHLORIDE 0.9 % IV SOLN
20.0000 mg | Freq: Once | INTRAVENOUS | Status: AC
  Administered 2017-02-15: 20 mg via INTRAVENOUS
  Filled 2017-02-15: qty 2

## 2017-02-15 MED ORDER — CARBOPLATIN CHEMO INJECTION 450 MG/45ML
194.6000 mg | Freq: Once | INTRAVENOUS | Status: AC
  Administered 2017-02-15: 190 mg via INTRAVENOUS
  Filled 2017-02-15: qty 19

## 2017-02-15 MED ORDER — FAMOTIDINE IN NACL 20-0.9 MG/50ML-% IV SOLN
INTRAVENOUS | Status: AC
Start: 2017-02-15 — End: 2017-02-15
  Filled 2017-02-15: qty 50

## 2017-02-15 MED ORDER — MIRTAZAPINE 30 MG PO TABS: 30.0000 mg | ORAL_TABLET | Freq: Every day | ORAL | 1 refills | Status: DC

## 2017-02-15 MED ORDER — DIPHENHYDRAMINE HCL 50 MG/ML IJ SOLN
50.0000 mg | Freq: Once | INTRAMUSCULAR | Status: AC
  Administered 2017-02-15: 50 mg via INTRAVENOUS

## 2017-02-15 MED ORDER — SODIUM CHLORIDE 0.9% FLUSH
10.0000 mL | INTRAVENOUS | Status: DC | PRN
  Administered 2017-02-15: 10 mL
  Filled 2017-02-15: qty 10

## 2017-02-15 NOTE — Patient Instructions (Signed)
[facility] Discharge Instructions for Patients Receiving Chemotherapy  Today you received the following chemotherapy agents :  paclitaxel (Taxol) and Carboplatin.  To help prevent nausea and vomiting after your treatment, we encourage you to take your nausea medication as prescribed.   If you develop nausea and vomiting that is not controlled by your nausea medication, call the clinic.   BELOW ARE SYMPTOMS THAT SHOULD BE REPORTED IMMEDIATELY:  *FEVER GREATER THAN 100.5 F  *CHILLS WITH OR WITHOUT FEVER  NAUSEA AND VOMITING THAT IS NOT CONTROLLED WITH YOUR NAUSEA MEDICATION  *UNUSUAL SHORTNESS OF BREATH  *UNUSUAL BRUISING OR BLEEDING  TENDERNESS IN MOUTH AND THROAT WITH OR WITHOUT PRESENCE OF ULCERS  *URINARY PROBLEMS  *BOWEL PROBLEMS  UNUSUAL RASH Items with * indicate a potential emergency and should be followed up as soon as possible.  Feel free to call the clinic should you have any questions or concerns. The clinic phone number is [phone].  Please show the [place] at check-in to the Emergency Department and triage nurse.

## 2017-02-15 NOTE — Telephone Encounter (Signed)
Gave patient avs and calendar with appts per 1/8 los.  °

## 2017-02-15 NOTE — Progress Notes (Signed)
Dr. Lottie Rater stated ok to treat with a HR of 40

## 2017-02-17 ENCOUNTER — Encounter: Payer: Self-pay | Admitting: Hematology and Oncology

## 2017-02-17 ENCOUNTER — Telehealth: Payer: Self-pay

## 2017-02-17 ENCOUNTER — Ambulatory Visit (INDEPENDENT_AMBULATORY_CARE_PROVIDER_SITE_OTHER): Payer: Medicare Other | Admitting: Cardiology

## 2017-02-17 ENCOUNTER — Encounter: Payer: Self-pay | Admitting: Cardiology

## 2017-02-17 VITALS — BP 100/62 | HR 74 | Ht 68.0 in | Wt 159.4 lb

## 2017-02-17 DIAGNOSIS — I10 Essential (primary) hypertension: Secondary | ICD-10-CM | POA: Diagnosis not present

## 2017-02-17 DIAGNOSIS — I2510 Atherosclerotic heart disease of native coronary artery without angina pectoris: Secondary | ICD-10-CM

## 2017-02-17 DIAGNOSIS — E7800 Pure hypercholesterolemia, unspecified: Secondary | ICD-10-CM

## 2017-02-17 DIAGNOSIS — Z9861 Coronary angioplasty status: Secondary | ICD-10-CM | POA: Diagnosis not present

## 2017-02-17 NOTE — Assessment & Plan Note (Signed)
He has progressive, profound weight loss The patient complained of dysgeusia He usually only eats one meal per day we discussed the importance of frequent meals and nutritional supplement I recommend appetite stimulant He is already on mirtazapine I recommend low-dose prednisone therapy

## 2017-02-17 NOTE — Assessment & Plan Note (Signed)
The patient has significant weight loss due to dysgeusia We will proceed with treatment as scheduled We discussed goals of care extensively He wants to proceed with treatment

## 2017-02-17 NOTE — Patient Instructions (Signed)
Stop taking metoprolol  Continue your other therapy  I will see you in 6 months

## 2017-02-17 NOTE — Telephone Encounter (Signed)
-----   Message from Heath Lark, MD sent at 02/17/2017  6:54 AM EST ----- Regarding: thyroid test Pls let him/wife aware his recent thyroid test is OK, so the cause of his bradycardia cannot be explained. He needs to make appt to see his cardiologist for further evaluation

## 2017-02-17 NOTE — Telephone Encounter (Signed)
Called with below message. States that he is leaving the Cardiologist office now, they are adjusting his medications.

## 2017-02-17 NOTE — Assessment & Plan Note (Signed)
He brought a copy of his advance directives I reviewed with the patient and his wife The patient confirm his CODE STATUS is DNR

## 2017-02-17 NOTE — Progress Notes (Signed)
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 Biopsy recommended. No definite pharyngeal mass lesion identified by CT.       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 No other primary or metastatic cancer. 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         11/13/2015 Procedure    Placement of 20 French pull-through percutaneous gastrostomy tube.      03/11/2016 PET scan    Resolution of hypermetabolic right cervical lymphadenopathy since prior study . No other hypermetabolic lymphadenopathy identified. New hypermetabolic lesion and caudate lobe of liver, highly suspicious for liver metastasis. Recommend liver protocol abdomen MRI without and with contrast for further evaluation. New patchy airspace disease in  both lung apices with FDG uptake. 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 Recommend continued attention on follow-up imaging.       03/19/2016 Imaging    MRI showed 3.4 cm heterogeneously enhancing mass in caudate lobe of liver, consistent liver metastasis. No other sites of metastatic disease identified within the liver or abdomen      03/26/2016 Procedure    Ultrasound and fluoroscopically guided right internal jugular single lumen power port catheter insertion. Tip in the SVC/RA junction. 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 Metabolic response to therapy of caudate lobe liver metastasis. Low-level activity in this area is similar to the surrounding liver.  2. Hypermetabolism in the right inguinal crease without concurrent adenopathy. Especially given subtle surrounding edema, favor cellulitis. Consider physical exam correlation. 3. Lack of visualization of previously described 7 mm left lower lobe pulmonary nodule. 4. Diffuse marrow hypermetabolism, favoring stimulation by chemotherapy.      09/20/2016 PET scan  1. Reduced activity in the hypermetabolic caudate lobe lesion of the liver, likely a metastatic lesion. 2. Scattered likely inflammatory activity along the glenohumeral and acromioclavicular joints and in the hips. 3. Healing of bilateral upper rib fractures. 4. Other imaging findings of potential clinical significance: Aortic Atherosclerosis (ICD10-I70.0). Coronary and  carotid atherosclerosis. Photopenic right renal cysts.      11/17/2016 Procedure    Successful removal of 20 French pull-through gastrostomy tube.      12/09/2016 PET scan    1. Slight increase in size and degree of uptake associated with caudate lobe lesion consistent with metastatic disease 2. Increase in size and FDG uptake associated with portacaval lymph node 3. Aortic Atherosclerosis (ICD10-I70.0). Coronary atherosclerotic changes noted.      12/27/2016 -  Chemotherapy    He received carboplatin and taxol      01/19/2017 - 01/25/2017 Hospital Admission    He was admitted to the hospital with sepsis secondary to cholangitis, managed conservatively      01/21/2017 Imaging    1. Cholelithiasis with gallbladder distension and subtle pericholecystic edema. Findings suspicious for acute cholecystitis. Right upper quadrant ultrasound could confirm. 2. Similar caudate lobe metastasis. Similar borderline porta hepatis adenopathy. 3. Left nephrolithiasis. 4. New tiny right pleural effusion with bibasilar atelectasis. 5. Trace air in the nondependent urinary bladder. Correlate with instrumentation. 6. Coronary artery atherosclerosis. Aortic Atherosclerosis (ICD10-I70.0).         Cancer of base of tongue ([place])    INTERVAL HISTORY: Please see below for problem oriented charting. He returns for further follow-up with his wife Since recent discharge from the hospital, he has very poor appetite He only eats 1 meal a day He has lost a lot of weight His wife documented persistent bradycardia and hypotension at home He denies dizziness or presyncopal episode Denies chest pain or shortness of breath He denies progressive peripheral neuropathy Denies swallowing difficulties He denies recent recurrent infection, fever or chills  REVIEW OF SYSTEMS:   Constitutional: Denies fevers, chills  Eyes: Denies blurriness of vision Ears, nose, mouth, throat, and face: Denies mucositis or  sore throat Respiratory: Denies cough, dyspnea or wheezes Cardiovascular: Denies palpitation, chest discomfort or lower extremity swelling Gastrointestinal:  Denies nausea, heartburn or change in bowel habits Skin: Denies abnormal skin rashes Lymphatics: Denies new lymphadenopathy or easy bruising Neurological:Denies numbness, tingling or new weaknesses Behavioral/Psych: Mood is stable, no new changes  All other systems were reviewed with the patient and are negative.  I have reviewed the past medical history, past surgical history, social history and family history with the patient and they are unchanged from previous note.  ALLERGIES:  is allergic to cetuximab.  MEDICATIONS:  Current Outpatient Medications  Medication Sig Dispense Refill  . clopidogrel (PLAVIX) 75 MG tablet TAKE 1 TABLET EVERY DAY 90 tablet 2  . insulin glargine (LANTUS) 100 UNIT/ML injection Inject 0.5 mLs (50 Units total) into the skin at bedtime. 10 mL   . metoprolol tartrate (LOPRESSOR) 25 MG tablet Take 0.5 tablets (12.5 mg total) by mouth 2 (two) times daily. 60 tablet 0  . mirtazapine (REMERON) 30 MG tablet Take 1 tablet (30 mg total) by mouth at bedtime. 90 tablet 1  . Multiple Vitamin (MULTIVITAMIN WITH MINERALS) TABS tablet Take 1 tablet by mouth daily. 30 tablet 0  . ondansetron (ZOFRAN) 8 MG tablet Take 1 tablet (8 mg total) by mouth every 8 (eight) hours as needed for nausea or vomiting. 60 tablet 11  . polyethylene glycol (MIRALAX /  GLYCOLAX) packet Take 17 g by mouth daily as needed. 14 each 0  . predniSONE (DELTASONE) 20 MG tablet Take 1 tablet (20 mg total) by mouth daily with breakfast. 30 tablet 1   No current facility-administered medications for this visit.     PHYSICAL EXAMINATION: ECOG PERFORMANCE STATUS: 2 - Symptomatic, <50% confined to bed  Vitals:   02/15/17 1058  BP: 121/84  Pulse: (!) 42  Resp: 18  Temp: 97.9 F (36.6 C)  SpO2: 100%   Filed Weights   02/15/17 1058  Weight: 157  lb (71.2 kg)    GENERAL:alert, no distress and comfortable SKIN: skin color, texture, turgor are normal, no rashes or significant lesions EYES: normal, Conjunctiva are pink and non-injected, sclera clear OROPHARYNX:no exudate, no erythema and lips, buccal mucosa, and tongue normal  NECK: supple, thyroid normal size, non-tender, without nodularity LYMPH:  no palpable lymphadenopathy in the cervical, axillary or inguinal LUNGS: clear to auscultation and percussion with normal breathing effort HEART: regular rate & rhythm and no murmurs and no lower extremity edema ABDOMEN:abdomen soft, non-tender and normal bowel sounds Musculoskeletal:no cyanosis of digits and no clubbing  NEURO: alert & oriented x 3 with fluent speech, no focal motor/sensory deficits  LABORATORY DATA:  I have reviewed the data as listed    Component Value Date/Time   NA 140 02/15/2017 1027   NA 140 02/07/2017 0811   K 3.7 02/15/2017 1027   K 3.9 02/07/2017 0811   CL 107 02/15/2017 1027   CO2 26 02/15/2017 1027   CO2 25 02/07/2017 0811   GLUCOSE 150 (H) 02/15/2017 1027   GLUCOSE 118 02/07/2017 0811   BUN 22 02/15/2017 1027   BUN 17.8 02/07/2017 0811   CREATININE 1.03 02/15/2017 1027   CREATININE 1.0 02/07/2017 0811   CALCIUM 9.4 02/15/2017 1027   CALCIUM 9.4 02/07/2017 0811   PROT 6.3 (L) 02/15/2017 1027   PROT 6.7 02/07/2017 0811   ALBUMIN 3.3 (L) 02/15/2017 1027   ALBUMIN 3.2 (L) 02/07/2017 0811   AST 19 02/15/2017 1027   AST 16 02/07/2017 0811   ALT 18 02/15/2017 1027   ALT 17 02/07/2017 0811   ALKPHOS 103 02/15/2017 1027   ALKPHOS 144 02/07/2017 0811   BILITOT 0.7 02/15/2017 1027   BILITOT 0.70 02/07/2017 0811   GFRNONAA >60 02/15/2017 1027   GFRAA >60 02/15/2017 1027    No results found for: SPEP, UPEP  Lab Results  Component Value Date   WBC 4.0 02/15/2017   NEUTROABS 3.3 02/15/2017   HGB 11.8 (L) 02/15/2017   HCT 35.2 (L) 02/15/2017   MCV 89.1 02/15/2017   PLT 148 02/15/2017       Chemistry      Component Value Date/Time   NA 140 02/15/2017 1027   NA 140 02/07/2017 0811   K 3.7 02/15/2017 1027   K 3.9 02/07/2017 0811   CL 107 02/15/2017 1027   CO2 26 02/15/2017 1027   CO2 25 02/07/2017 0811   BUN 22 02/15/2017 1027   BUN 17.8 02/07/2017 0811   CREATININE 1.03 02/15/2017 1027   CREATININE 1.0 02/07/2017 0811   GLU 78 11/13/2015 1134      Component Value Date/Time   CALCIUM 9.4 02/15/2017 1027   CALCIUM 9.4 02/07/2017 0811   ALKPHOS 103 02/15/2017 1027   ALKPHOS 144 02/07/2017 0811   AST 19 02/15/2017 1027   AST 16 02/07/2017 0811   ALT 18 02/15/2017 1027   ALT 17 02/07/2017 0811   BILITOT 0.7  02/15/2017 1027   BILITOT 0.70 02/07/2017 0811       RADIOGRAPHIC STUDIES: I have personally reviewed the radiological images as listed and agreed with the findings in the report. Ct Abdomen Pelvis Wo Contrast  Result Date: 01/21/2017 CLINICAL DATA:  Abdominal distension. Head neck cancer with liver metastasis. On chemotherapy. EXAM: CT ABDOMEN AND PELVIS WITHOUT CONTRAST TECHNIQUE: Multidetector CT imaging of the abdomen and pelvis was performed following the standard protocol without IV contrast. COMPARISON:  12/09/2016 PET. FINDINGS: Lower chest: Right greater than left base atelectasis. Normal heart size with multivessel coronary artery atherosclerosis. Trace right pleural fluid is new. Hepatobiliary: Minimal exclusion of the hepatic dome. Subtle low-density caudate lobe lesion is felt to be similar, on the order of 3.2 x 2.4 cm on image 12/series 2. Small gallstones. Mild gallbladder distension. Subtle pericholecystic edema, including image 21/series 2. No biliary duct dilatation. Pancreas: Mild pancreatic atrophy, without duct dilatation or dominant mass. Spleen: Normal in size, without focal abnormality. Adrenals/Urinary Tract: Normal adrenal glands. Punctate left renal collecting system calculi, most apparent on coronal reformats. Low-density right renal lesions  are likely cysts, including at 5.0 cm. No hydroureter or ureteric calculi. No bladder calculi. trace air within the nondependent bladder, including on image 75/series 2. Stomach/Bowel: Normal stomach, without wall thickening. Scattered colonic diverticula. Normal terminal ileum. Normal small bowel. Vascular/Lymphatic: Aortic and branch vessel atherosclerosis. 1.3 cm portal caval node is similar to on the prior. No pelvic sidewall adenopathy. Reproductive: Mild prostatomegaly. Other: No significant free fluid.  No free intraperitoneal air. Musculoskeletal:Right iliac bone island.  Thoracolumbar spondylosis IMPRESSION: 1. Cholelithiasis with gallbladder distension and subtle pericholecystic edema. Findings suspicious for acute cholecystitis. Right upper quadrant ultrasound could confirm. 2. Similar caudate lobe metastasis. Similar borderline porta hepatis adenopathy. 3. Left nephrolithiasis. 4. New tiny right pleural effusion with bibasilar atelectasis. 5. Trace air in the nondependent urinary bladder. Correlate with instrumentation. 6. Coronary artery atherosclerosis. Aortic Atherosclerosis (ICD10-I70.0). Electronically Signed   By: Abigail Miyamoto M.D.   On: 01/21/2017 10:19   Dg Chest 2 View  Result Date: 01/21/2017 CLINICAL DATA:  Fever, weakness and cough for 24 hours. EXAM: CHEST  2 VIEW COMPARISON:  04/12/2016 FINDINGS: Right jugular port extends to the right atrium.The lungs are clear. No pleural effusion. Normal pulmonary vasculature. Hilar, mediastinal and cardiac contours are unremarkable unchanged. IMPRESSION: No acute cardiopulmonary findings. Electronically Signed   By: Andreas Newport M.D.   On: 01/21/2017 02:52   Nm Hepatobiliary Liver Func  Result Date: 01/24/2017 CLINICAL DATA:  Gallstones in gallbladder wall thickening. Generalized weakness. EXAM: NUCLEAR MEDICINE HEPATOBILIARY IMAGING TECHNIQUE: Sequential images of the abdomen were obtained out to 60 minutes following intravenous  administration of radiopharmaceutical. RADIOPHARMACEUTICALS:  5.3 mCi Tc-19m  Choletec IV 3.18 mg morphine COMPARISON:  01/21/2017 ultrasound FINDINGS: Satisfactory uptake of radiopharmaceutical from the blood pool. Bowel activity visible at 12 minutes. There is some initial non filling of the gallbladder over 60 minutes. With IV morphine administration there is slow filling of the gallbladder ovaries subsequent 30 minutes. , indicating cystic duct patency. IMPRESSION: 1. Initial non filling of the gallbladder but after morphine administration the gallbladder is observed to slowly fill, indicating patency of the cystic duct. Radiopharmaceutical does extend into the bowel as well, indicating patency of the common bile duct. Electronically Signed   By: Van Clines M.D.   On: 01/24/2017 11:18   Mr Abdomen Mrcp Wo Contrast  Result Date: 01/24/2017 CLINICAL DATA:  Hepatic metastatic disease. History of tongue and  prostate cancer. EXAM: MRI ABDOMEN WITHOUT CONTRAST  (INCLUDING MRCP) TECHNIQUE: Multiplanar multisequence MR imaging of the abdomen was performed. Heavily T2-weighted images of the biliary and pancreatic ducts were obtained, and three-dimensional MRCP images were rendered by post processing. COMPARISON:  PET-CT 12/09/2016. abdominopelvic CT and abdominal ultrasound 01/21/2017. FINDINGS: Lower chest: Small right-greater-than-left pleural effusions with mild dependent atelectasis at both lung bases. Hepatobiliary: There is a lesion in the caudate lobe which demonstrates low T1 signal, increased T2 signal and restricted diffusion. This corresponds with the hypermetabolic lesion on prior PET-CT and measures up to 2.8 cm on image 46 of series 8. I believe there are both intrahepatic and adjacent extrahepatic nodal components. No other hepatic lesions are identified. Thin section MRCP images demonstrate multiple small gallstones. There is no biliary dilatation or evidence of choledocholithiasis. Mild  gallbladder wall thickening is present as on recent CT. No focal surrounding inflammation identified. Pancreas: The pancreas is atrophied. There is a 7 mm cyst in the pancreatic tail which is unlikely to be clinically significant. No evidence of pancreatic ductal dilatation. Spleen: Normal in size without focal abnormality. Adrenals/Urinary Tract: Both adrenal glands appear normal. Right renal cysts, measuring up to 5.7 cm in the posterior interpolar region. There are tiny T1 hyperintense lesions in both kidneys. No worrisome renal findings or hydronephrosis. Stomach/Bowel: No evidence of bowel wall thickening, distention or surrounding inflammatory change.Mild sigmoid diverticulosis. Vascular/Lymphatic: Stable lymph nodes in the porta hepatis and gastrohepatic ligament with associated restricted diffusion. These were mildly hypermetabolic on PET-CT. No significant vascular findings are present. Other: Mesenteric, retroperitoneal and subcutaneous edema. No focal fluid collections. Musculoskeletal: No acute or significant osseous findings. IMPRESSION: 1. Cholelithiasis with nonspecific gallbladder wall thickening as seen on recent previous studies. There is no focal pericholecystic inflammation allowing for generalized soft tissue edema. 2. No biliary dilatation or choledocholithiasis. 3. Similar appearance of known metastasis in the caudate lobe and adjacent porta hepatis lymph nodes. 4. Suggested mild anasarca with bilateral pleural effusions, mesenteric, retroperitoneal and subcutaneous edema. Electronically Signed   By: Richardean Sale M.D.   On: 01/24/2017 12:17   Mr 3d Recon At Scanner  Result Date: 01/24/2017 CLINICAL DATA:  Hepatic metastatic disease. History of tongue and prostate cancer. EXAM: MRI ABDOMEN WITHOUT CONTRAST  (INCLUDING MRCP) TECHNIQUE: Multiplanar multisequence MR imaging of the abdomen was performed. Heavily T2-weighted images of the biliary and pancreatic ducts were obtained, and  three-dimensional MRCP images were rendered by post processing. COMPARISON:  PET-CT 12/09/2016. abdominopelvic CT and abdominal ultrasound 01/21/2017. FINDINGS: Lower chest: Small right-greater-than-left pleural effusions with mild dependent atelectasis at both lung bases. Hepatobiliary: There is a lesion in the caudate lobe which demonstrates low T1 signal, increased T2 signal and restricted diffusion. This corresponds with the hypermetabolic lesion on prior PET-CT and measures up to 2.8 cm on image 46 of series 8. I believe there are both intrahepatic and adjacent extrahepatic nodal components. No other hepatic lesions are identified. Thin section MRCP images demonstrate multiple small gallstones. There is no biliary dilatation or evidence of choledocholithiasis. Mild gallbladder wall thickening is present as on recent CT. No focal surrounding inflammation identified. Pancreas: The pancreas is atrophied. There is a 7 mm cyst in the pancreatic tail which is unlikely to be clinically significant. No evidence of pancreatic ductal dilatation. Spleen: Normal in size without focal abnormality. Adrenals/Urinary Tract: Both adrenal glands appear normal. Right renal cysts, measuring up to 5.7 cm in the posterior interpolar region. There are tiny T1 hyperintense lesions in both kidneys.  No worrisome renal findings or hydronephrosis. Stomach/Bowel: No evidence of bowel wall thickening, distention or surrounding inflammatory change.Mild sigmoid diverticulosis. Vascular/Lymphatic: Stable lymph nodes in the porta hepatis and gastrohepatic ligament with associated restricted diffusion. These were mildly hypermetabolic on PET-CT. No significant vascular findings are present. Other: Mesenteric, retroperitoneal and subcutaneous edema. No focal fluid collections. Musculoskeletal: No acute or significant osseous findings. IMPRESSION: 1. Cholelithiasis with nonspecific gallbladder wall thickening as seen on recent previous studies.  There is no focal pericholecystic inflammation allowing for generalized soft tissue edema. 2. No biliary dilatation or choledocholithiasis. 3. Similar appearance of known metastasis in the caudate lobe and adjacent porta hepatis lymph nodes. 4. Suggested mild anasarca with bilateral pleural effusions, mesenteric, retroperitoneal and subcutaneous edema. Electronically Signed   By: Richardean Sale M.D.   On: 01/24/2017 12:17   US Abdomen Limited Ruq  Result Date: 01/21/2017 CLINICAL DATA:  Abnormal CT.  Abdominal distension. EXAM: ULTRASOUND ABDOMEN LIMITED RIGHT UPPER QUADRANT COMPARISON:  Abdominal CT earlier this day. FINDINGS: Gallbladder: Distended with wall thickening measuring up to 5-6 mm. Multiple shadowing intraluminal calculi. No frank pericholecystic fluid. No sonographic Murphy sign noted by sonographer. Common bile duct: Diameter: 3 mm. Liver: Central hypodense caudate lesion measures approximately 3.8 x 2.6 x 3.3 cm. No additional focal lesion. Otherwise within normal limits in parenchymal echogenicity. Portal vein is patent on color Doppler imaging with normal direction of blood flow towards the liver. IMPRESSION: 1. Distended gallbladder with gallstones and gallbladder wall thickening. Sonographic findings are concerning for acute cholecystitis, and corroborate CT findings. However sonographic Percell Miller sign is negative, and gallbladder wall thickening may be secondary to systemic causes. Nuclear medicine HIDA scan could be considered for further evaluation based on clinical concern. 2. Hypodense metastasis in the caudate measures approximately 3.3 cm, better characterized on cross-sectional imaging. Electronically Signed   By: Jeb Levering M.D.   On: 01/21/2017 21:44    ASSESSMENT & PLAN:  Tongue cancer ([place]) The patient has significant weight loss due to dysgeusia We will proceed with treatment as scheduled We discussed goals of care extensively He wants to proceed with  treatment  CAD S/P percutaneous coronary angioplasty The patient had prior history of coronary artery disease He is noted to be mildly hypotensive and bradycardic Repeat TSH is within normal limits I recommend consultation with his cardiologist for further evaluation  Weight loss He has progressive, profound weight loss The patient complained of dysgeusia He usually only eats one meal per day we discussed the importance of frequent meals and nutritional supplement I recommend appetite stimulant He is already on mirtazapine I recommend low-dose prednisone therapy  Goals of care, counseling/discussion He brought a copy of his advance directives I reviewed with the patient and his wife The patient confirm his CODE STATUS is DNR   Orders Placed This Encounter  Procedures  . TSH    Standing Status:   Future    Number of Occurrences:   1    Standing Expiration Date:   03/22/2018   All questions were answered. The patient knows to call the clinic with any problems, questions or concerns. No barriers to learning was detected. I spent 25 minutes counseling the patient face to face. The total time spent in the appointment was 30 minutes and more than 50% was on counseling and review of test results     Heath Lark, MD 02/17/2017 6:59 AM

## 2017-02-17 NOTE — Assessment & Plan Note (Signed)
The patient had prior history of coronary artery disease He is noted to be mildly hypotensive and bradycardic Repeat TSH is within normal limits I recommend consultation with his cardiologist for further evaluation

## 2017-02-22 ENCOUNTER — Inpatient Hospital Stay: Payer: Medicare Other

## 2017-02-22 ENCOUNTER — Telehealth: Payer: Self-pay | Admitting: Hematology and Oncology

## 2017-02-22 ENCOUNTER — Ambulatory Visit: Payer: Medicare Other

## 2017-02-22 ENCOUNTER — Inpatient Hospital Stay (HOSPITAL_BASED_OUTPATIENT_CLINIC_OR_DEPARTMENT_OTHER): Payer: Medicare Other | Admitting: Hematology and Oncology

## 2017-02-22 DIAGNOSIS — C770 Secondary and unspecified malignant neoplasm of lymph nodes of head, face and neck: Secondary | ICD-10-CM

## 2017-02-22 DIAGNOSIS — C029 Malignant neoplasm of tongue, unspecified: Secondary | ICD-10-CM

## 2017-02-22 DIAGNOSIS — D61818 Other pancytopenia: Secondary | ICD-10-CM

## 2017-02-22 DIAGNOSIS — C787 Secondary malignant neoplasm of liver and intrahepatic bile duct: Secondary | ICD-10-CM

## 2017-02-22 DIAGNOSIS — R110 Nausea: Secondary | ICD-10-CM

## 2017-02-22 DIAGNOSIS — C01 Malignant neoplasm of base of tongue: Secondary | ICD-10-CM

## 2017-02-22 DIAGNOSIS — Z5111 Encounter for antineoplastic chemotherapy: Secondary | ICD-10-CM | POA: Diagnosis not present

## 2017-02-22 DIAGNOSIS — R634 Abnormal weight loss: Secondary | ICD-10-CM | POA: Diagnosis not present

## 2017-02-22 LAB — COMPREHENSIVE METABOLIC PANEL
ALBUMIN: 3.4 g/dL — AB (ref 3.5–5.0)
ALK PHOS: 81 U/L (ref 40–150)
ALT: 25 U/L (ref 0–55)
AST: 13 U/L (ref 5–34)
Anion gap: 10 (ref 3–11)
BILIRUBIN TOTAL: 0.6 mg/dL (ref 0.2–1.2)
BUN: 22 mg/dL (ref 7–26)
CO2: 26 mmol/L (ref 22–29)
CREATININE: 0.83 mg/dL (ref 0.70–1.30)
Calcium: 9.3 mg/dL (ref 8.4–10.4)
Chloride: 106 mmol/L (ref 98–109)
GFR calc Af Amer: >60 mL/min (ref >60)
GLUCOSE: 147 mg/dL — AB (ref 70–140)
Potassium: 3.2 mmol/L — ABNORMAL LOW (ref 3.5–5.1)
Sodium: 142 mmol/L (ref 136–145)
TOTAL PROTEIN: 6.1 g/dL — AB (ref 6.4–8.3)

## 2017-02-22 LAB — CBC WITH DIFFERENTIAL/PLATELET
BASOS ABS: 0 10*3/uL (ref 0.0–0.1)
BASOS PCT: 0 %
EOS ABS: 0 10*3/uL (ref 0.0–0.5)
Eosinophils Relative: 1 %
HEMATOCRIT: 34.6 % — AB (ref 38.4–49.9)
HEMOGLOBIN: 11.5 g/dL — AB (ref 13.0–17.1)
LYMPHS ABS: 0.5 10*3/uL — AB (ref 0.9–3.3)
Lymphocytes Relative: 22 %
MCH: 29.7 pg (ref 27.2–33.4)
MCHC: 33.3 g/dL (ref 32.0–36.0)
MCV: 89 fL (ref 79.3–98.0)
MONOS PCT: 5 %
Monocytes Absolute: 0.1 10*3/uL (ref 0.1–0.9)
Neutro Abs: 1.5 10*3/uL (ref 1.5–6.5)
Neutrophils Relative %: 72 %
Platelets: 146 10*3/uL (ref 140–400)
RBC: 3.89 MIL/uL — AB (ref 4.20–5.82)
RDW: 15.3 % (ref 11.0–15.6)
WBC: 2 10*3/uL — AB (ref 4.0–10.3)

## 2017-02-22 MED ORDER — SODIUM CHLORIDE 0.9% FLUSH
10.0000 mL | INTRAVENOUS | Status: DC | PRN
  Administered 2017-02-22: 10 mL
  Filled 2017-02-22: qty 10

## 2017-02-22 MED ORDER — SODIUM CHLORIDE 0.9 % IV SOLN
45.0000 mg/m2 | Freq: Once | INTRAVENOUS | Status: AC
  Administered 2017-02-22: 90 mg via INTRAVENOUS
  Filled 2017-02-22: qty 15

## 2017-02-22 MED ORDER — SODIUM CHLORIDE 0.9 % IV SOLN
20.0000 mg | Freq: Once | INTRAVENOUS | Status: AC
  Administered 2017-02-22: 20 mg via INTRAVENOUS
  Filled 2017-02-22: qty 2

## 2017-02-22 MED ORDER — DIPHENHYDRAMINE HCL 50 MG/ML IJ SOLN
50.0000 mg | Freq: Once | INTRAMUSCULAR | Status: AC
  Administered 2017-02-22: 50 mg via INTRAVENOUS

## 2017-02-22 MED ORDER — SODIUM CHLORIDE 0.9% FLUSH
10.0000 mL | Freq: Once | INTRAVENOUS | Status: AC
  Administered 2017-02-22: 10 mL
  Filled 2017-02-22: qty 10

## 2017-02-22 MED ORDER — DIPHENHYDRAMINE HCL 50 MG/ML IJ SOLN
INTRAMUSCULAR | Status: AC
  Filled 2017-02-22: qty 1

## 2017-02-22 MED ORDER — HEPARIN SOD (PORK) LOCK FLUSH 100 UNIT/ML IV SOLN
500.0000 [IU] | Freq: Once | INTRAVENOUS | Status: AC | PRN
  Administered 2017-02-22: 500 [IU]
  Filled 2017-02-22: qty 5

## 2017-02-22 MED ORDER — FAMOTIDINE IN NACL 20-0.9 MG/50ML-% IV SOLN
20.0000 mg | Freq: Once | INTRAVENOUS | Status: AC
  Administered 2017-02-22: 20 mg via INTRAVENOUS

## 2017-02-22 MED ORDER — FAMOTIDINE IN NACL 20-0.9 MG/50ML-% IV SOLN
INTRAVENOUS | Status: AC
  Filled 2017-02-22: qty 50

## 2017-02-22 MED ORDER — PALONOSETRON HCL INJECTION 0.25 MG/5ML
0.2500 mg | Freq: Once | INTRAVENOUS | Status: AC
  Administered 2017-02-22: 0.25 mg via INTRAVENOUS

## 2017-02-22 MED ORDER — SODIUM CHLORIDE 0.9 % IV SOLN
190.0000 mg | Freq: Once | INTRAVENOUS | Status: AC
  Administered 2017-02-22: 190 mg via INTRAVENOUS
  Filled 2017-02-22: qty 19

## 2017-02-22 MED ORDER — PALONOSETRON HCL INJECTION 0.25 MG/5ML
INTRAVENOUS | Status: AC
  Filled 2017-02-22: qty 5

## 2017-02-22 MED ORDER — SODIUM CHLORIDE 0.9 % IV SOLN
Freq: Once | INTRAVENOUS | Status: AC
  Administered 2017-02-22: 10:00:00 via INTRAVENOUS

## 2017-02-22 NOTE — Telephone Encounter (Signed)
No 11/5 los.  °

## 2017-02-22 NOTE — Patient Instructions (Signed)
[facility] Discharge Instructions for Patients Receiving Chemotherapy  Today you received the following chemotherapy agents: Paclitaxel (Taxol) and Carboplatin (Paraplatin).  To help prevent nausea and vomiting after your treatment, we encourage you to take your nausea medication as prescribed. Received Aloxi during treatment-->take Compazine (not Zofran) for the next 3 days.  If you develop nausea and vomiting that is not controlled by your nausea medication, call the clinic.   BELOW ARE SYMPTOMS THAT SHOULD BE REPORTED IMMEDIATELY:  *FEVER GREATER THAN 100.5 F  *CHILLS WITH OR WITHOUT FEVER  NAUSEA AND VOMITING THAT IS NOT CONTROLLED WITH YOUR NAUSEA MEDICATION  *UNUSUAL SHORTNESS OF BREATH  *UNUSUAL BRUISING OR BLEEDING  TENDERNESS IN MOUTH AND THROAT WITH OR WITHOUT PRESENCE OF ULCERS  *URINARY PROBLEMS  *BOWEL PROBLEMS  UNUSUAL RASH Items with * indicate a potential emergency and should be followed up as soon as possible.  Feel free to call the clinic should you have any questions or concerns. The clinic phone number is [phone].  Please show the CHEMO ALERT CARD at check-in to the Emergency Department and triage nurse.   

## 2017-02-23 ENCOUNTER — Encounter: Payer: Self-pay | Admitting: Hematology and Oncology

## 2017-02-23 DIAGNOSIS — D61818 Other pancytopenia: Secondary | ICD-10-CM | POA: Insufficient documentation

## 2017-02-23 NOTE — Assessment & Plan Note (Signed)
He is not symptomatic and his liver function is preserved

## 2017-02-23 NOTE — Assessment & Plan Note (Signed)
He has acquired pancytopenia He is not symptomatic He does not need blood transfusion After treatment this week, he would take a week break to allow blood count recovery

## 2017-02-23 NOTE — Assessment & Plan Note (Signed)
The patient has significant weight loss due to dysgeusia Since I put him on low-dose prednisone, he is doing better and is clinically improving We will proceed with treatment as scheduled I plan to repeat imaging study after 6-8 doses of treatment

## 2017-02-23 NOTE — Progress Notes (Signed)
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 Biopsy recommended. No definite pharyngeal mass lesion identified by CT.       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 No other primary or metastatic cancer. 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         11/13/2015 Procedure    Placement of 20 French pull-through percutaneous gastrostomy tube.      03/11/2016 PET scan    Resolution of hypermetabolic right cervical lymphadenopathy since prior study . No other hypermetabolic lymphadenopathy identified. New hypermetabolic lesion and caudate lobe of liver, highly suspicious for liver metastasis. Recommend liver protocol abdomen MRI without and with contrast for further evaluation. New patchy airspace disease in  both lung apices with FDG uptake. 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 Recommend continued attention on follow-up imaging.       03/19/2016 Imaging    MRI showed 3.4 cm heterogeneously enhancing mass in caudate lobe of liver, consistent liver metastasis. No other sites of metastatic disease identified within the liver or abdomen      03/26/2016 Procedure    Ultrasound and fluoroscopically guided right internal jugular single lumen power port catheter insertion. Tip in the SVC/RA junction. 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 Metabolic response to therapy of caudate lobe liver metastasis. Low-level activity in this area is similar to the surrounding liver.  2. Hypermetabolism in the right inguinal crease without concurrent adenopathy. Especially given subtle surrounding edema, favor cellulitis. Consider physical exam correlation. 3. Lack of visualization of previously described 7 mm left lower lobe pulmonary nodule. 4. Diffuse marrow hypermetabolism, favoring stimulation by chemotherapy.      09/20/2016 PET scan  1. Reduced activity in the hypermetabolic caudate lobe lesion of the liver, likely a metastatic lesion. 2. Scattered likely inflammatory activity along the glenohumeral and acromioclavicular joints and in the hips. 3. Healing of bilateral upper rib fractures. 4. Other imaging findings of potential clinical significance: Aortic Atherosclerosis (ICD10-I70.0). Coronary and  carotid atherosclerosis. Photopenic right renal cysts.      11/17/2016 Procedure    Successful removal of 20 French pull-through gastrostomy tube.      12/09/2016 PET scan    1. Slight increase in size and degree of uptake associated with caudate lobe lesion consistent with metastatic disease 2. Increase in size and FDG uptake associated with portacaval lymph node 3. Aortic Atherosclerosis (ICD10-I70.0). Coronary atherosclerotic changes noted.      12/27/2016 -  Chemotherapy    He received carboplatin and taxol      01/19/2017 - 01/25/2017 Hospital Admission    He was admitted to the hospital with sepsis secondary to cholangitis, managed conservatively      01/21/2017 Imaging    1. Cholelithiasis with gallbladder distension and subtle pericholecystic edema. Findings suspicious for acute cholecystitis. Right upper quadrant ultrasound could confirm. 2. Similar caudate lobe metastasis. Similar borderline porta hepatis adenopathy. 3. Left nephrolithiasis. 4. New tiny right pleural effusion with bibasilar atelectasis. 5. Trace air in the nondependent urinary bladder. Correlate with instrumentation. 6. Coronary artery atherosclerosis. Aortic Atherosclerosis (ICD10-I70.0).         Cancer of base of tongue ([place])    INTERVAL HISTORY: Please see below for problem oriented charting. He returns with his wife for chemotherapy He denies recent fever, chills or infection Since he was started on prednisone, he is gaining weight with more energy He denies significant fluctuation of his blood sugar He denies significant peripheral neuropathy from treatment No recent nausea or vomiting  REVIEW OF SYSTEMS:   Constitutional: Denies fevers, chills or abnormal weight loss Eyes: Denies blurriness of vision Ears, nose, mouth, throat, and face: Denies mucositis or sore throat Respiratory: Denies cough, dyspnea or wheezes Cardiovascular: Denies palpitation, chest discomfort or lower extremity  swelling Gastrointestinal:  Denies nausea, heartburn or change in bowel habits Skin: Denies abnormal skin rashes Lymphatics: Denies new lymphadenopathy or easy bruising Neurological:Denies numbness, tingling or new weaknesses Behavioral/Psych: Mood is stable, no new changes  All other systems were reviewed with the patient and are negative.  I have reviewed the past medical history, past surgical history, social history and family history with the patient and they are unchanged from previous note.  ALLERGIES:  is allergic to cetuximab.  MEDICATIONS:  Current Outpatient Medications  Medication Sig Dispense Refill  . clopidogrel (PLAVIX) 75 MG tablet TAKE 1 TABLET EVERY DAY 90 tablet 2  . insulin glargine (LANTUS) 100 UNIT/ML injection Inject 0.5 mLs (50 Units total) into the skin at bedtime. 10 mL   . mirtazapine (REMERON) 30 MG tablet Take 1 tablet (30 mg total) by mouth at bedtime. 90 tablet 1  . Multiple Vitamin (MULTIVITAMIN WITH MINERALS) TABS tablet Take 1 tablet by mouth daily. 30 tablet 0  . ondansetron (ZOFRAN) 8 MG tablet Take 1 tablet (8 mg total) by mouth every 8 (eight) hours as needed for nausea or vomiting. 60 tablet 11  . polyethylene glycol (MIRALAX / GLYCOLAX) packet Take 17 g by mouth daily as needed. 14 each 0  . predniSONE (DELTASONE) 20 MG tablet Take 1 tablet (20 mg total) by mouth daily with breakfast. 30 tablet 1   No current facility-administered medications for this visit.  PHYSICAL EXAMINATION: ECOG PERFORMANCE STATUS: 1 - Symptomatic but completely ambulatory  Vitals:   02/22/17 0839  BP: (!) 119/50  Pulse: 90  Resp: 18  Temp: 98 F (36.7 C)  SpO2: 100%   Filed Weights   02/22/17 0839  Weight: 156 lb 14.4 oz (71.2 kg)    GENERAL:alert, no distress and comfortable SKIN: skin color, texture, turgor are normal, no rashes or significant lesions EYES: normal, Conjunctiva are pink and non-injected, sclera clear OROPHARYNX:no exudate, no erythema  and lips, buccal mucosa, and tongue normal  NECK: supple, thyroid normal size, non-tender, without nodularity LYMPH:  no palpable lymphadenopathy in the cervical, axillary or inguinal LUNGS: clear to auscultation and percussion with normal breathing effort HEART: regular rate & rhythm and no murmurs and no lower extremity edema ABDOMEN:abdomen soft, non-tender and normal bowel sounds Musculoskeletal:no cyanosis of digits and no clubbing  NEURO: alert & oriented x 3 with fluent speech, no focal motor/sensory deficits  LABORATORY DATA:  I have reviewed the data as listed    Component Value Date/Time   NA 142 02/22/2017 0746   NA 140 02/07/2017 0811   K 3.2 (L) 02/22/2017 0746   K 3.9 02/07/2017 0811   CL 106 02/22/2017 0746   CO2 26 02/22/2017 0746   CO2 25 02/07/2017 0811   GLUCOSE 147 (H) 02/22/2017 0746   GLUCOSE 118 02/07/2017 0811   BUN 22 02/22/2017 0746   BUN 17.8 02/07/2017 0811   CREATININE 0.83 02/22/2017 0746   CREATININE 1.0 02/07/2017 0811   CALCIUM 9.3 02/22/2017 0746   CALCIUM 9.4 02/07/2017 0811   PROT 6.1 (L) 02/22/2017 0746   PROT 6.7 02/07/2017 0811   ALBUMIN 3.4 (L) 02/22/2017 0746   ALBUMIN 3.2 (L) 02/07/2017 0811   AST 13 02/22/2017 0746   AST 16 02/07/2017 0811   ALT 25 02/22/2017 0746   ALT 17 02/07/2017 0811   ALKPHOS 81 02/22/2017 0746   ALKPHOS 144 02/07/2017 0811   BILITOT 0.6 02/22/2017 0746   BILITOT 0.70 02/07/2017 0811   GFRNONAA >60 02/22/2017 0746   GFRAA >60 02/22/2017 0746    No results found for: SPEP, UPEP  Lab Results  Component Value Date   WBC 2.0 (L) 02/22/2017   NEUTROABS 1.5 02/22/2017   HGB 11.5 (L) 02/22/2017   HCT 34.6 (L) 02/22/2017   MCV 89.0 02/22/2017   PLT 146 02/22/2017      Chemistry      Component Value Date/Time   NA 142 02/22/2017 0746   NA 140 02/07/2017 0811   K 3.2 (L) 02/22/2017 0746   K 3.9 02/07/2017 0811   CL 106 02/22/2017 0746   CO2 26 02/22/2017 0746   CO2 25 02/07/2017 0811   BUN 22  02/22/2017 0746   BUN 17.8 02/07/2017 0811   CREATININE 0.83 02/22/2017 0746   CREATININE 1.0 02/07/2017 0811   GLU 78 11/13/2015 1134      Component Value Date/Time   CALCIUM 9.3 02/22/2017 0746   CALCIUM 9.4 02/07/2017 0811   ALKPHOS 81 02/22/2017 0746   ALKPHOS 144 02/07/2017 0811   AST 13 02/22/2017 0746   AST 16 02/07/2017 0811   ALT 25 02/22/2017 0746   ALT 17 02/07/2017 0811   BILITOT 0.6 02/22/2017 0746   BILITOT 0.70 02/07/2017 0811       RADIOGRAPHIC STUDIES: I have personally reviewed the radiological images as listed and agreed with the findings in the report. Nm Hepatobiliary Liver Func  Result Date: 01/24/2017 CLINICAL DATA:  Gallstones in gallbladder wall thickening. Generalized weakness. EXAM: NUCLEAR MEDICINE HEPATOBILIARY IMAGING TECHNIQUE: Sequential images of the abdomen were obtained out to 60 minutes following intravenous administration of radiopharmaceutical. RADIOPHARMACEUTICALS:  5.3 mCi Tc-19m  Choletec IV 3.18 mg morphine COMPARISON:  01/21/2017 ultrasound FINDINGS: Satisfactory uptake of radiopharmaceutical from the blood pool. Bowel activity visible at 12 minutes. There is some initial non filling of the gallbladder over 60 minutes. With IV morphine administration there is slow filling of the gallbladder ovaries subsequent 30 minutes. , indicating cystic duct patency. IMPRESSION: 1. Initial non filling of the gallbladder but after morphine administration the gallbladder is observed to slowly fill, indicating patency of the cystic duct. Radiopharmaceutical does extend into the bowel as well, indicating patency of the common bile duct. Electronically Signed   By: Van Clines M.D.   On: 01/24/2017 11:18   Mr Abdomen Mrcp Wo Contrast  Result Date: 01/24/2017 CLINICAL DATA:  Hepatic metastatic disease. History of tongue and prostate cancer. EXAM: MRI ABDOMEN WITHOUT CONTRAST  (INCLUDING MRCP) TECHNIQUE: Multiplanar multisequence MR imaging of the abdomen  was performed. Heavily T2-weighted images of the biliary and pancreatic ducts were obtained, and three-dimensional MRCP images were rendered by post processing. COMPARISON:  PET-CT 12/09/2016. abdominopelvic CT and abdominal ultrasound 01/21/2017. FINDINGS: Lower chest: Small right-greater-than-left pleural effusions with mild dependent atelectasis at both lung bases. Hepatobiliary: There is a lesion in the caudate lobe which demonstrates low T1 signal, increased T2 signal and restricted diffusion. This corresponds with the hypermetabolic lesion on prior PET-CT and measures up to 2.8 cm on image 46 of series 8. I believe there are both intrahepatic and adjacent extrahepatic nodal components. No other hepatic lesions are identified. Thin section MRCP images demonstrate multiple small gallstones. There is no biliary dilatation or evidence of choledocholithiasis. Mild gallbladder wall thickening is present as on recent CT. No focal surrounding inflammation identified. Pancreas: The pancreas is atrophied. There is a 7 mm cyst in the pancreatic tail which is unlikely to be clinically significant. No evidence of pancreatic ductal dilatation. Spleen: Normal in size without focal abnormality. Adrenals/Urinary Tract: Both adrenal glands appear normal. Right renal cysts, measuring up to 5.7 cm in the posterior interpolar region. There are tiny T1 hyperintense lesions in both kidneys. No worrisome renal findings or hydronephrosis. Stomach/Bowel: No evidence of bowel wall thickening, distention or surrounding inflammatory change.Mild sigmoid diverticulosis. Vascular/Lymphatic: Stable lymph nodes in the porta hepatis and gastrohepatic ligament with associated restricted diffusion. These were mildly hypermetabolic on PET-CT. No significant vascular findings are present. Other: Mesenteric, retroperitoneal and subcutaneous edema. No focal fluid collections. Musculoskeletal: No acute or significant osseous findings. IMPRESSION: 1.  Cholelithiasis with nonspecific gallbladder wall thickening as seen on recent previous studies. There is no focal pericholecystic inflammation allowing for generalized soft tissue edema. 2. No biliary dilatation or choledocholithiasis. 3. Similar appearance of known metastasis in the caudate lobe and adjacent porta hepatis lymph nodes. 4. Suggested mild anasarca with bilateral pleural effusions, mesenteric, retroperitoneal and subcutaneous edema. Electronically Signed   By: Richardean Sale M.D.   On: 01/24/2017 12:17   Mr 3d Recon At Scanner  Result Date: 01/24/2017 CLINICAL DATA:  Hepatic metastatic disease. History of tongue and prostate cancer. EXAM: MRI ABDOMEN WITHOUT CONTRAST  (INCLUDING MRCP) TECHNIQUE: Multiplanar multisequence MR imaging of the abdomen was performed. Heavily T2-weighted images of the biliary and pancreatic ducts were obtained, and three-dimensional MRCP images were rendered by post processing. COMPARISON:  PET-CT 12/09/2016. abdominopelvic CT and abdominal ultrasound 01/21/2017. FINDINGS: Lower chest:  Small right-greater-than-left pleural effusions with mild dependent atelectasis at both lung bases. Hepatobiliary: There is a lesion in the caudate lobe which demonstrates low T1 signal, increased T2 signal and restricted diffusion. This corresponds with the hypermetabolic lesion on prior PET-CT and measures up to 2.8 cm on image 46 of series 8. I believe there are both intrahepatic and adjacent extrahepatic nodal components. No other hepatic lesions are identified. Thin section MRCP images demonstrate multiple small gallstones. There is no biliary dilatation or evidence of choledocholithiasis. Mild gallbladder wall thickening is present as on recent CT. No focal surrounding inflammation identified. Pancreas: The pancreas is atrophied. There is a 7 mm cyst in the pancreatic tail which is unlikely to be clinically significant. No evidence of pancreatic ductal dilatation. Spleen: Normal in  size without focal abnormality. Adrenals/Urinary Tract: Both adrenal glands appear normal. Right renal cysts, measuring up to 5.7 cm in the posterior interpolar region. There are tiny T1 hyperintense lesions in both kidneys. No worrisome renal findings or hydronephrosis. Stomach/Bowel: No evidence of bowel wall thickening, distention or surrounding inflammatory change.Mild sigmoid diverticulosis. Vascular/Lymphatic: Stable lymph nodes in the porta hepatis and gastrohepatic ligament with associated restricted diffusion. These were mildly hypermetabolic on PET-CT. No significant vascular findings are present. Other: Mesenteric, retroperitoneal and subcutaneous edema. No focal fluid collections. Musculoskeletal: No acute or significant osseous findings. IMPRESSION: 1. Cholelithiasis with nonspecific gallbladder wall thickening as seen on recent previous studies. There is no focal pericholecystic inflammation allowing for generalized soft tissue edema. 2. No biliary dilatation or choledocholithiasis. 3. Similar appearance of known metastasis in the caudate lobe and adjacent porta hepatis lymph nodes. 4. Suggested mild anasarca with bilateral pleural effusions, mesenteric, retroperitoneal and subcutaneous edema. Electronically Signed   By: Richardean Sale M.D.   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  All questions were answered. The patient knows to call the clinic with any problems, questions or concerns. No barriers to learning was detected. I spent 15 minutes counseling the patient face to face. The total time spent in the appointment was 20 minutes and more than 50% was on counseling and review of test results     Heath Lark, MD 02/23/2017 9:16 AM

## 2017-02-23 NOTE — Assessment & Plan Note (Signed)
He had recent profound weight loss Since I started him on prednisone in combination with Remeron, his weight has stabilized I encouraged him to increase oral intake as tolerated with plan for prednisone taper in the future

## 2017-02-25 ENCOUNTER — Telehealth: Payer: Self-pay | Admitting: Cardiology

## 2017-02-25 NOTE — Telephone Encounter (Signed)
New Message     STAT if HR is under 50 or over 120 (normal HR is 60-100 beats per minute)  1) What is your heart rate? 44 bp 90/60  2) Do you have a log of your heart rate readings (document readings)?   Today 44   1/14 78 1/11 88 1/09 44 1/7 48  (stopped taking lisinopril last week) 1/4 60 1/3 60  3) Do you have any other symptoms? No symptoms

## 2017-02-25 NOTE — Telephone Encounter (Signed)
Spoke to pt assistant beth - rechecked blood pressure is 104/62 radial pulse 43. Asked if she can do apical pulse. It was 48  alittle irreg towards the end per beth.  no changes at present other than hold physical therapy for the day . Will defer to  Dr [name] and will cal patient back with further instructions

## 2017-02-25 NOTE — Telephone Encounter (Signed)
Spoke to [place] states pulse is 44  Radial , blood pressure 90/60.  No symptoms of dizziness , issues at present time   she states patient did have  Breakfast , milk , patient is not big water drinker. Glucose 130 after breakfast.  no cardiac Medication at present only taking plavix, insulin, mvi, prednisone, mirtazapine( remeron)   Beth states she has given patient some water to drink. Recommend to drink at least 2 glasses of water and recheck blood pressure .  Will call back

## 2017-02-25 NOTE — Telephone Encounter (Signed)
Spoke to patient's wife . Information given , if symptomatic is severe go to ER , mild symptoms may contact office.

## 2017-02-25 NOTE — Telephone Encounter (Signed)
HR was in 80s when seen in December. He is on no rate slowing meds. If he is not symptomatic there is nothing else we need to do.   [name] MD, [facility]

## 2017-03-07 ENCOUNTER — Inpatient Hospital Stay: Payer: Medicare Other

## 2017-03-07 ENCOUNTER — Inpatient Hospital Stay (HOSPITAL_BASED_OUTPATIENT_CLINIC_OR_DEPARTMENT_OTHER): Payer: Medicare Other | Admitting: Hematology and Oncology

## 2017-03-07 ENCOUNTER — Telehealth: Payer: Self-pay | Admitting: Hematology and Oncology

## 2017-03-07 VITALS — BP 101/63 | HR 81 | Temp 98.4°F | Resp 18 | Ht 68.0 in | Wt 164.3 lb

## 2017-03-07 DIAGNOSIS — C029 Malignant neoplasm of tongue, unspecified: Secondary | ICD-10-CM

## 2017-03-07 DIAGNOSIS — Z794 Long term (current) use of insulin: Secondary | ICD-10-CM | POA: Diagnosis not present

## 2017-03-07 DIAGNOSIS — D61818 Other pancytopenia: Secondary | ICD-10-CM | POA: Diagnosis not present

## 2017-03-07 DIAGNOSIS — C770 Secondary and unspecified malignant neoplasm of lymph nodes of head, face and neck: Secondary | ICD-10-CM

## 2017-03-07 DIAGNOSIS — R634 Abnormal weight loss: Secondary | ICD-10-CM

## 2017-03-07 DIAGNOSIS — R432 Parageusia: Secondary | ICD-10-CM

## 2017-03-07 DIAGNOSIS — C01 Malignant neoplasm of base of tongue: Secondary | ICD-10-CM

## 2017-03-07 DIAGNOSIS — R110 Nausea: Secondary | ICD-10-CM

## 2017-03-07 DIAGNOSIS — C787 Secondary malignant neoplasm of liver and intrahepatic bile duct: Secondary | ICD-10-CM | POA: Diagnosis not present

## 2017-03-07 DIAGNOSIS — Z5111 Encounter for antineoplastic chemotherapy: Secondary | ICD-10-CM | POA: Diagnosis not present

## 2017-03-07 DIAGNOSIS — E1140 Type 2 diabetes mellitus with diabetic neuropathy, unspecified: Secondary | ICD-10-CM | POA: Diagnosis not present

## 2017-03-07 LAB — COMPREHENSIVE METABOLIC PANEL
ALBUMIN: 3.3 g/dL — AB (ref 3.5–5.0)
ALK PHOS: 62 U/L (ref 40–150)
ALT: 18 U/L (ref 0–55)
ANION GAP: 9 (ref 3–11)
AST: 13 U/L (ref 5–34)
BUN: 19 mg/dL (ref 7–26)
CO2: 26 mmol/L (ref 22–29)
Calcium: 9 mg/dL (ref 8.4–10.4)
Chloride: 107 mmol/L (ref 98–109)
Creatinine, Ser: 0.89 mg/dL (ref 0.70–1.30)
GFR calc Af Amer: >60 mL/min (ref >60)
GFR calc non Af Amer: >60 mL/min (ref >60)
GLUCOSE: 174 mg/dL — AB (ref 70–140)
POTASSIUM: 3.2 mmol/L — AB (ref 3.5–5.1)
Sodium: 142 mmol/L (ref 136–145)
Total Bilirubin: 0.4 mg/dL (ref 0.2–1.2)
Total Protein: 5.8 g/dL — ABNORMAL LOW (ref 6.4–8.3)

## 2017-03-07 LAB — CBC WITH DIFFERENTIAL/PLATELET
BASOS ABS: 0 10*3/uL (ref 0.0–0.1)
BASOS PCT: 1 %
EOS ABS: 0 10*3/uL (ref 0.0–0.5)
Eosinophils Relative: 0 %
HCT: 34.4 % — ABNORMAL LOW (ref 38.4–49.9)
HEMOGLOBIN: 11.4 g/dL — AB (ref 13.0–17.1)
Lymphocytes Relative: 13 %
Lymphs Abs: 0.4 10*3/uL — ABNORMAL LOW (ref 0.9–3.3)
MCH: 30.2 pg (ref 27.2–33.4)
MCHC: 33.1 g/dL (ref 32.0–36.0)
MCV: 91.1 fL (ref 79.3–98.0)
MONOS PCT: 11 %
Monocytes Absolute: 0.3 10*3/uL (ref 0.1–0.9)
NEUTROS PCT: 75 %
Neutro Abs: 2.4 10*3/uL (ref 1.5–6.5)
Platelets: 120 10*3/uL — ABNORMAL LOW (ref 140–400)
RBC: 3.78 MIL/uL — ABNORMAL LOW (ref 4.20–5.82)
RDW: 17.4 % — ABNORMAL HIGH (ref 11.0–15.6)
WBC: 3.1 10*3/uL — AB (ref 4.0–10.3)

## 2017-03-07 MED ORDER — SODIUM CHLORIDE 0.9% FLUSH
10.0000 mL | INTRAVENOUS | Status: DC | PRN
  Administered 2017-03-07: 10 mL
  Filled 2017-03-07: qty 10

## 2017-03-07 MED ORDER — FAMOTIDINE IN NACL 20-0.9 MG/50ML-% IV SOLN
INTRAVENOUS | Status: AC
  Filled 2017-03-07: qty 50

## 2017-03-07 MED ORDER — SODIUM CHLORIDE 0.9 % IV SOLN
198.8000 mg | Freq: Once | INTRAVENOUS | Status: AC
  Administered 2017-03-07: 200 mg via INTRAVENOUS
  Filled 2017-03-07: qty 20

## 2017-03-07 MED ORDER — DIPHENHYDRAMINE HCL 50 MG/ML IJ SOLN
INTRAMUSCULAR | Status: AC
  Filled 2017-03-07: qty 1

## 2017-03-07 MED ORDER — SODIUM CHLORIDE 0.9 % IV SOLN
20.0000 mg | Freq: Once | INTRAVENOUS | Status: AC
  Administered 2017-03-07: 20 mg via INTRAVENOUS
  Filled 2017-03-07: qty 2

## 2017-03-07 MED ORDER — FAMOTIDINE IN NACL 20-0.9 MG/50ML-% IV SOLN
20.0000 mg | Freq: Once | INTRAVENOUS | Status: AC
Start: 2017-03-07 — End: 2017-03-07
  Administered 2017-03-07: 20 mg via INTRAVENOUS

## 2017-03-07 MED ORDER — DIPHENHYDRAMINE HCL 50 MG/ML IJ SOLN
50.0000 mg | Freq: Once | INTRAMUSCULAR | Status: AC
  Administered 2017-03-07: 50 mg via INTRAVENOUS

## 2017-03-07 MED ORDER — SODIUM CHLORIDE 0.9% FLUSH
10.0000 mL | Freq: Once | INTRAVENOUS | Status: AC
  Administered 2017-03-07: 10 mL
  Filled 2017-03-07: qty 10

## 2017-03-07 MED ORDER — PALONOSETRON HCL INJECTION 0.25 MG/5ML
0.2500 mg | Freq: Once | INTRAVENOUS | Status: AC
  Administered 2017-03-07: 0.25 mg via INTRAVENOUS

## 2017-03-07 MED ORDER — SODIUM CHLORIDE 0.9 % IV SOLN
Freq: Once | INTRAVENOUS | Status: AC
  Administered 2017-03-07: 10:00:00 via INTRAVENOUS

## 2017-03-07 MED ORDER — SODIUM CHLORIDE 0.9 % IV SOLN
45.0000 mg/m2 | Freq: Once | INTRAVENOUS | Status: AC
  Administered 2017-03-07: 90 mg via INTRAVENOUS
  Filled 2017-03-07: qty 15

## 2017-03-07 MED ORDER — PALONOSETRON HCL INJECTION 0.25 MG/5ML
INTRAVENOUS | Status: AC
  Filled 2017-03-07: qty 5

## 2017-03-07 MED ORDER — HEPARIN SOD (PORK) LOCK FLUSH 100 UNIT/ML IV SOLN
500.0000 [IU] | Freq: Once | INTRAVENOUS | Status: AC | PRN
  Administered 2017-03-07: 500 [IU]
  Filled 2017-03-07: qty 5

## 2017-03-07 NOTE — Patient Instructions (Signed)
[facility] Discharge Instructions for Patients Receiving Chemotherapy  Today you received the following chemotherapy agents: Carboplatin and Taxol  To help prevent nausea and vomiting after your treatment, we encourage you to take your nausea medication as directed   If you develop nausea and vomiting that is not controlled by your nausea medication, call the clinic.   BELOW ARE SYMPTOMS THAT SHOULD BE REPORTED IMMEDIATELY:  *FEVER GREATER THAN 100.5 F  *CHILLS WITH OR WITHOUT FEVER  NAUSEA AND VOMITING THAT IS NOT CONTROLLED WITH YOUR NAUSEA MEDICATION  *UNUSUAL SHORTNESS OF BREATH  *UNUSUAL BRUISING OR BLEEDING  TENDERNESS IN MOUTH AND THROAT WITH OR WITHOUT PRESENCE OF ULCERS  *URINARY PROBLEMS  *BOWEL PROBLEMS  UNUSUAL RASH Items with * indicate a potential emergency and should be followed up as soon as possible.  Feel free to call the clinic should you have any questions or concerns. The clinic phone number is [phone].  Please show the CHEMO ALERT CARD at check-in to the Emergency Department and triage nurse.   

## 2017-03-07 NOTE — Telephone Encounter (Signed)
Gave patient AVS and calendar of upcoming February appointments.  °

## 2017-03-08 ENCOUNTER — Encounter: Payer: Self-pay | Admitting: Hematology and Oncology

## 2017-03-08 NOTE — Assessment & Plan Note (Signed)
He has acquired pancytopenia He is not symptomatic He does not need blood transfusion He will continue intermittent holiday break to allow blood count recovery.

## 2017-03-08 NOTE — Assessment & Plan Note (Signed)
Despite being on prednisone, his blood sugar is not significantly high We will continue to monitor carefully and he will take insulin as needed I recommend prednisone taper soon

## 2017-03-08 NOTE — Progress Notes (Signed)
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 Biopsy recommended. No definite pharyngeal mass lesion identified by CT.       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 No other primary or metastatic cancer. 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         11/13/2015 Procedure    Placement of 20 French pull-through percutaneous gastrostomy tube.      03/11/2016 PET scan    Resolution of hypermetabolic right cervical lymphadenopathy since prior study . No other hypermetabolic lymphadenopathy identified. New hypermetabolic lesion and caudate lobe of liver, highly suspicious for liver metastasis. Recommend liver protocol abdomen MRI without and with contrast for further evaluation. New patchy airspace disease in  both lung apices with FDG uptake. 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 Recommend continued attention on follow-up imaging.       03/19/2016 Imaging    MRI showed 3.4 cm heterogeneously enhancing mass in caudate lobe of liver, consistent liver metastasis. No other sites of metastatic disease identified within the liver or abdomen      03/26/2016 Procedure    Ultrasound and fluoroscopically guided right internal jugular single lumen power port catheter insertion. Tip in the SVC/RA junction. 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 Metabolic response to therapy of caudate lobe liver metastasis. Low-level activity in this area is similar to the surrounding liver.  2. Hypermetabolism in the right inguinal crease without concurrent adenopathy. Especially given subtle surrounding edema, favor cellulitis. Consider physical exam correlation. 3. Lack of visualization of previously described 7 mm left lower lobe pulmonary nodule. 4. Diffuse marrow hypermetabolism, favoring stimulation by chemotherapy.      09/20/2016 PET scan  1. Reduced activity in the hypermetabolic caudate lobe lesion of the liver, likely a metastatic lesion. 2. Scattered likely inflammatory activity along the glenohumeral and acromioclavicular joints and in the hips. 3. Healing of bilateral upper rib fractures. 4. Other imaging findings of potential clinical significance: Aortic Atherosclerosis (ICD10-I70.0). Coronary and  carotid atherosclerosis. Photopenic right renal cysts.      11/17/2016 Procedure    Successful removal of 20 French pull-through gastrostomy tube.      12/09/2016 PET scan    1. Slight increase in size and degree of uptake associated with caudate lobe lesion consistent with metastatic disease 2. Increase in size and FDG uptake associated with portacaval lymph node 3. Aortic Atherosclerosis (ICD10-I70.0). Coronary atherosclerotic changes noted.      12/27/2016 -  Chemotherapy    He received carboplatin and taxol      01/19/2017 - 01/25/2017 Hospital Admission    He was admitted to the hospital with sepsis secondary to cholangitis, managed conservatively      01/21/2017 Imaging    1. Cholelithiasis with gallbladder distension and subtle pericholecystic edema. Findings suspicious for acute cholecystitis. Right upper quadrant ultrasound could confirm. 2. Similar caudate lobe metastasis. Similar borderline porta hepatis adenopathy. 3. Left nephrolithiasis. 4. New tiny right pleural effusion with bibasilar atelectasis. 5. Trace air in the nondependent urinary bladder. Correlate with instrumentation. 6. Coronary artery atherosclerosis. Aortic Atherosclerosis (ICD10-I70.0).         Cancer of base of tongue ([place])    INTERVAL HISTORY: Please see below for problem oriented charting. He returns for further follow-up He tolerated recent chemotherapy well Denies significant nausea, vomiting or peripheral neuropathy He is gaining weight with prednisone therapy Denies significant elevated blood sugar while on prednisone  REVIEW OF SYSTEMS:   Constitutional: Denies fevers, chills or abnormal weight loss Eyes: Denies blurriness of vision Ears, nose, mouth, throat, and face: Denies mucositis or sore throat Respiratory: Denies cough, dyspnea or wheezes Cardiovascular: Denies palpitation, chest discomfort or lower extremity swelling Gastrointestinal:  Denies nausea, heartburn or change  in bowel habits Skin: Denies abnormal skin rashes Lymphatics: Denies new lymphadenopathy or easy bruising Neurological:Denies numbness, tingling or new weaknesses Behavioral/Psych: Mood is stable, no new changes  All other systems were reviewed with the patient and are negative.  I have reviewed the past medical history, past surgical history, social history and family history with the patient and they are unchanged from previous note.  ALLERGIES:  is allergic to cetuximab.  MEDICATIONS:  Current Outpatient Medications  Medication Sig Dispense Refill  . clopidogrel (PLAVIX) 75 MG tablet TAKE 1 TABLET EVERY DAY 90 tablet 2  . insulin glargine (LANTUS) 100 UNIT/ML injection Inject 0.5 mLs (50 Units total) into the skin at bedtime. 10 mL   . mirtazapine (REMERON) 30 MG tablet Take 1 tablet (30 mg total) by mouth at bedtime. 90 tablet 1  . Multiple Vitamin (MULTIVITAMIN WITH MINERALS) TABS tablet Take 1 tablet by mouth daily. 30 tablet 0  . ondansetron (ZOFRAN) 8 MG tablet Take 1 tablet (8 mg total) by mouth every 8 (eight) hours as needed for nausea or vomiting. 60 tablet 11  . polyethylene glycol (MIRALAX / GLYCOLAX) packet Take 17 g by mouth daily as needed. 14 each 0  . predniSONE (DELTASONE) 20 MG tablet Take 1 tablet (20 mg total) by mouth daily with breakfast. 30 tablet 1   No current facility-administered medications for this visit.     PHYSICAL EXAMINATION: ECOG PERFORMANCE STATUS: 1 - Symptomatic but completely ambulatory  Vitals:   03/07/17 0828  BP: 101/63  Pulse: 81  Resp: 18  Temp: 98.4 F (36.9 C)  SpO2: 100%   Filed Weights   03/07/17 0828  Weight: 164 lb 4.8 oz (74.5 kg)    GENERAL:alert, no distress and comfortable SKIN: skin color, texture, turgor are normal, no rashes or significant lesions EYES: normal, Conjunctiva are pink and non-injected, sclera clear OROPHARYNX:no exudate, no erythema and lips, buccal mucosa, and tongue normal  NECK: supple, thyroid  normal size, non-tender, without nodularity LYMPH:  no palpable lymphadenopathy in the cervical, axillary or inguinal LUNGS: clear to auscultation and percussion with normal breathing effort HEART: regular rate & rhythm and no murmurs and no lower extremity edema ABDOMEN:abdomen soft, non-tender and normal bowel sounds Musculoskeletal:no cyanosis of digits and no clubbing  NEURO: alert & oriented x 3 with fluent speech, no focal motor/sensory deficits  LABORATORY DATA:  I have reviewed the data as listed    Component Value Date/Time   NA 142 03/07/2017 0758   NA 140 02/07/2017 0811   K 3.2 (L) 03/07/2017 0758   K 3.9 02/07/2017 0811   CL 107 03/07/2017 0758   CO2 26 03/07/2017 0758   CO2 25 02/07/2017 0811   GLUCOSE 174 (H) 03/07/2017 0758   GLUCOSE 118 02/07/2017 0811   BUN 19 03/07/2017 0758   BUN 17.8 02/07/2017 0811   CREATININE 0.89 03/07/2017 0758   CREATININE 1.0 02/07/2017 0811   CALCIUM 9.0 03/07/2017 0758   CALCIUM 9.4 02/07/2017 0811   PROT 5.8 (L) 03/07/2017 0758   PROT 6.7 02/07/2017 0811   ALBUMIN 3.3 (L) 03/07/2017 0758   ALBUMIN 3.2 (L) 02/07/2017 0811   AST 13 03/07/2017 0758   AST 16 02/07/2017 0811   ALT 18 03/07/2017 0758   ALT 17 02/07/2017 0811   ALKPHOS 62 03/07/2017 0758   ALKPHOS 144 02/07/2017 0811   BILITOT 0.4 03/07/2017 0758   BILITOT 0.70 02/07/2017 0811   GFRNONAA >60 03/07/2017 0758   GFRAA >60 03/07/2017 0758    No results found for: SPEP, UPEP  Lab Results  Component Value Date   WBC 3.1 (L) 03/07/2017   NEUTROABS 2.4 03/07/2017   HGB 11.4 (L) 03/07/2017   HCT 34.4 (L) 03/07/2017   MCV 91.1 03/07/2017   PLT 120 (L) 03/07/2017      Chemistry      Component Value Date/Time   NA 142 03/07/2017 0758   NA 140 02/07/2017 0811   K 3.2 (L) 03/07/2017 0758   K 3.9 02/07/2017 0811   CL 107 03/07/2017 0758   CO2 26 03/07/2017 0758   CO2 25 02/07/2017 0811   BUN 19 03/07/2017 0758   BUN 17.8 02/07/2017 0811   CREATININE 0.89  03/07/2017 0758   CREATININE 1.0 02/07/2017 0811   GLU 78 11/13/2015 1134      Component Value Date/Time   CALCIUM 9.0 03/07/2017 0758   CALCIUM 9.4 02/07/2017 0811   ALKPHOS 62 03/07/2017 0758   ALKPHOS 144 02/07/2017 0811   AST 13 03/07/2017 0758   AST 16 02/07/2017 0811   ALT 18 03/07/2017 0758   ALT 17 02/07/2017 0811   BILITOT 0.4 03/07/2017 0758   BILITOT 0.70 02/07/2017 0811       ASSESSMENT & PLAN:  Tongue cancer (HCC) The patient has significant weight loss due to dysgeusia Since I put him on low-dose prednisone, he is doing better and is clinically improving We will proceed with treatment as scheduled I plan to  repeat imaging study next month.  Pancytopenia, acquired [facility]) He has acquired pancytopenia He is not symptomatic He does not need blood transfusion He will continue intermittent holiday break to allow blood count recovery.  Diabetes mellitus with diabetic neuropathy, with long-term current use of insulin ([place]) Despite being on prednisone, his blood sugar is not significantly high We will continue to monitor carefully and he will take insulin as needed I recommend prednisone taper soon   Orders Placed This Encounter  Procedures  . NM PET Image Restag (PS) Skull Base To Thigh    Standing Status:   Future    Standing Expiration Date:   03/07/2018    Order Specific Question:   If indicated for the ordered procedure, I authorize the administration of a radiopharmaceutical per Radiology protocol    Answer:   Yes    Order Specific Question:   Preferred imaging location?    Answer:   [facility]    Order Specific Question:   Radiology Contrast Protocol - do NOT remove file path    Answer:   [email or web address]   All questions were answered. The patient knows to call the clinic with any problems, questions or concerns. No barriers to learning was detected. I spent 15 minutes counseling the patient face to face. The total  time spent in the appointment was 20 minutes and more than 50% was on counseling and review of test results     Heath Lark, MD 03/08/2017 12:48 PM

## 2017-03-08 NOTE — Assessment & Plan Note (Signed)
The patient has significant weight loss due to dysgeusia Since I put him on low-dose prednisone, he is doing better and is clinically improving We will proceed with treatment as scheduled I plan to repeat imaging study next month.

## 2017-03-14 ENCOUNTER — Other Ambulatory Visit: Payer: Self-pay

## 2017-03-14 ENCOUNTER — Inpatient Hospital Stay: Payer: Medicare Other | Admitting: Hematology and Oncology

## 2017-03-14 ENCOUNTER — Inpatient Hospital Stay: Payer: Medicare Other

## 2017-03-14 ENCOUNTER — Inpatient Hospital Stay: Payer: Medicare Other | Attending: Hematology and Oncology

## 2017-03-14 VITALS — BP 136/71 | HR 84 | Temp 98.2°F | Resp 18

## 2017-03-14 DIAGNOSIS — C787 Secondary malignant neoplasm of liver and intrahepatic bile duct: Secondary | ICD-10-CM

## 2017-03-14 DIAGNOSIS — C029 Malignant neoplasm of tongue, unspecified: Secondary | ICD-10-CM

## 2017-03-14 DIAGNOSIS — C01 Malignant neoplasm of base of tongue: Secondary | ICD-10-CM

## 2017-03-14 DIAGNOSIS — R634 Abnormal weight loss: Secondary | ICD-10-CM | POA: Diagnosis not present

## 2017-03-14 DIAGNOSIS — D61818 Other pancytopenia: Secondary | ICD-10-CM | POA: Diagnosis not present

## 2017-03-14 DIAGNOSIS — C770 Secondary and unspecified malignant neoplasm of lymph nodes of head, face and neck: Secondary | ICD-10-CM | POA: Diagnosis not present

## 2017-03-14 DIAGNOSIS — Z5111 Encounter for antineoplastic chemotherapy: Secondary | ICD-10-CM | POA: Insufficient documentation

## 2017-03-14 DIAGNOSIS — R110 Nausea: Secondary | ICD-10-CM

## 2017-03-14 LAB — COMPREHENSIVE METABOLIC PANEL
ALT: 19 U/L (ref 0–55)
AST: 15 U/L (ref 5–34)
Albumin: 3.4 g/dL — ABNORMAL LOW (ref 3.5–5.0)
Alkaline Phosphatase: 64 U/L (ref 40–150)
Anion gap: 10 (ref 3–11)
BUN: 17 mg/dL (ref 7–26)
CHLORIDE: 105 mmol/L (ref 98–109)
CO2: 24 mmol/L (ref 22–29)
CREATININE: 0.81 mg/dL (ref 0.70–1.30)
Calcium: 9.2 mg/dL (ref 8.4–10.4)
GFR calc non Af Amer: >60 mL/min (ref >60)
Glucose, Bld: 195 mg/dL — ABNORMAL HIGH (ref 70–140)
POTASSIUM: 3.7 mmol/L (ref 3.5–5.1)
SODIUM: 139 mmol/L (ref 136–145)
Total Bilirubin: 0.3 mg/dL (ref 0.2–1.2)
Total Protein: 6.2 g/dL — ABNORMAL LOW (ref 6.4–8.3)

## 2017-03-14 LAB — CBC WITH DIFFERENTIAL/PLATELET
BASOS ABS: 0 10*3/uL (ref 0.0–0.1)
Basophils Relative: 0 %
EOS ABS: 0 10*3/uL (ref 0.0–0.5)
EOS PCT: 0 %
HCT: 33.3 % — ABNORMAL LOW (ref 38.4–49.9)
Hemoglobin: 11.1 g/dL — ABNORMAL LOW (ref 13.0–17.1)
Lymphocytes Relative: 13 %
Lymphs Abs: 0.6 10*3/uL — ABNORMAL LOW (ref 0.9–3.3)
MCH: 30.5 pg (ref 27.2–33.4)
MCHC: 33.3 g/dL (ref 32.0–36.0)
MCV: 91.5 fL (ref 79.3–98.0)
MONO ABS: 0.2 10*3/uL (ref 0.1–0.9)
Monocytes Relative: 4 %
Neutro Abs: 3.5 10*3/uL (ref 1.5–6.5)
Neutrophils Relative %: 83 %
PLATELETS: 81 10*3/uL — AB (ref 140–400)
RBC: 3.64 MIL/uL — AB (ref 4.20–5.82)
RDW: 16.1 % — AB (ref 11.0–14.6)
WBC: 4.3 10*3/uL (ref 4.0–10.3)

## 2017-03-14 MED ORDER — DEXAMETHASONE SODIUM PHOSPHATE 100 MG/10ML IJ SOLN
20.0000 mg | Freq: Once | INTRAMUSCULAR | Status: AC
  Administered 2017-03-14: 20 mg via INTRAVENOUS
  Filled 2017-03-14: qty 2

## 2017-03-14 MED ORDER — SODIUM CHLORIDE 0.9 % IV SOLN
45.0000 mg/m2 | Freq: Once | INTRAVENOUS | Status: AC
  Administered 2017-03-14: 90 mg via INTRAVENOUS
  Filled 2017-03-14: qty 15

## 2017-03-14 MED ORDER — PREDNISONE 10 MG PO TABS: 10.0000 mg | ORAL_TABLET | Freq: Every day | ORAL | 1 refills | Status: DC

## 2017-03-14 MED ORDER — SODIUM CHLORIDE 0.9 % IV SOLN
Freq: Once | INTRAVENOUS | Status: AC
  Administered 2017-03-14: 09:00:00 via INTRAVENOUS

## 2017-03-14 MED ORDER — DIPHENHYDRAMINE HCL 50 MG/ML IJ SOLN
INTRAMUSCULAR | Status: AC
  Filled 2017-03-14: qty 1

## 2017-03-14 MED ORDER — FAMOTIDINE IN NACL 20-0.9 MG/50ML-% IV SOLN
INTRAVENOUS | Status: AC
  Filled 2017-03-14: qty 50

## 2017-03-14 MED ORDER — FAMOTIDINE IN NACL 20-0.9 MG/50ML-% IV SOLN
20.0000 mg | Freq: Once | INTRAVENOUS | Status: AC
  Administered 2017-03-14: 20 mg via INTRAVENOUS

## 2017-03-14 MED ORDER — SODIUM CHLORIDE 0.9% FLUSH
10.0000 mL | INTRAVENOUS | Status: DC | PRN
  Administered 2017-03-14: 10 mL
  Filled 2017-03-14: qty 10

## 2017-03-14 MED ORDER — HEPARIN SOD (PORK) LOCK FLUSH 100 UNIT/ML IV SOLN
500.0000 [IU] | Freq: Once | INTRAVENOUS | Status: AC | PRN
  Administered 2017-03-14: 500 [IU]
  Filled 2017-03-14: qty 5

## 2017-03-14 MED ORDER — SODIUM CHLORIDE 0.9 % IV SOLN: 198.8000 mg | Freq: Once | INTRAVENOUS | Status: DC

## 2017-03-14 MED ORDER — PALONOSETRON HCL INJECTION 0.25 MG/5ML
INTRAVENOUS | Status: AC
  Filled 2017-03-14: qty 5

## 2017-03-14 MED ORDER — SODIUM CHLORIDE 0.9 % IV SOLN
198.8000 mg | Freq: Once | INTRAVENOUS | Status: AC
  Administered 2017-03-14: 200 mg via INTRAVENOUS
  Filled 2017-03-14: qty 20

## 2017-03-14 MED ORDER — PALONOSETRON HCL INJECTION 0.25 MG/5ML
0.2500 mg | Freq: Once | INTRAVENOUS | Status: AC
  Administered 2017-03-14: 0.25 mg via INTRAVENOUS

## 2017-03-14 MED ORDER — DIPHENHYDRAMINE HCL 50 MG/ML IJ SOLN
50.0000 mg | Freq: Once | INTRAMUSCULAR | Status: AC
  Administered 2017-03-14: 50 mg via INTRAVENOUS

## 2017-03-14 MED ORDER — SODIUM CHLORIDE 0.9% FLUSH
10.0000 mL | Freq: Once | INTRAVENOUS | Status: AC
  Administered 2017-03-14: 10 mL
  Filled 2017-03-14: qty 10

## 2017-03-14 NOTE — Progress Notes (Signed)
CBC/CMET reviewed with Dr. Alvy Bimler, "ok to treat despite labs".

## 2017-03-14 NOTE — Patient Instructions (Signed)
Implanted Port Home Guide An implanted port is a type of central line that is placed under the skin. Central lines are used to provide IV access when treatment or nutrition needs to be given through a person's veins. Implanted ports are used for long-term IV access. An implanted port may be placed because:  You need IV medicine that would be irritating to the small veins in your hands or arms.  You need long-term IV medicines, such as antibiotics.  You need IV nutrition for a long period.  You need frequent blood draws for lab tests.  You need dialysis.  Implanted ports are usually placed in the chest area, but they can also be placed in the upper arm, the abdomen, or the leg. An implanted port has two main parts:  Reservoir. The reservoir is round and will appear as a small, raised area under your skin. The reservoir is the part where a needle is inserted to give medicines or draw blood.  Catheter. The catheter is a thin, flexible tube that extends from the reservoir. The catheter is placed into a large vein. Medicine that is inserted into the reservoir goes into the catheter and then into the vein.  How will I care for my incision site? Do not get the incision site wet. Bathe or shower as directed by your health care provider. How is my port accessed? Special steps must be taken to access the port:  Before the port is accessed, a numbing cream can be placed on the skin. This helps numb the skin over the port site.  Your health care provider uses a sterile technique to access the port. ? Your health care provider must put on a mask and sterile gloves. ? The skin over your port is cleaned carefully with an antiseptic and allowed to dry. ? The port is gently pinched between sterile gloves, and a needle is inserted into the port.  Only "non-coring" port needles should be used to access the port. Once the port is accessed, a blood return should be checked. This helps ensure that the port  is in the vein and is not clogged.  If your port needs to remain accessed for a constant infusion, a clear (transparent) bandage will be placed over the needle site. The bandage and needle will need to be changed every week, or as directed by your health care provider.  Keep the bandage covering the needle clean and dry. Do not get it wet. Follow your health care provider's instructions on how to take a shower or bath while the port is accessed.  If your port does not need to stay accessed, no bandage is needed over the port.  What is flushing? Flushing helps keep the port from getting clogged. Follow your health care provider's instructions on how and when to flush the port. Ports are usually flushed with saline solution or a medicine called heparin. The need for flushing will depend on how the port is used.  If the port is used for intermittent medicines or blood draws, the port will need to be flushed: ? After medicines have been given. ? After blood has been drawn. ? As part of routine maintenance.  If a constant infusion is running, the port may not need to be flushed.  How long will my port stay implanted? The port can stay in for as long as your health care provider thinks it is needed. When it is time for the port to come out, surgery will be   done to remove it. The procedure is similar to the one performed when the port was put in. When should I seek immediate medical care? When you have an implanted port, you should seek immediate medical care if:  You notice a bad smell coming from the incision site.  You have swelling, redness, or drainage at the incision site.  You have more swelling or pain at the port site or the surrounding area.  You have a fever that is not controlled with medicine.  This information is not intended to replace advice given to you by your health care provider. Make sure you discuss any questions you have with your health care provider. Document  Released: 01/25/2005 Document Revised: 07/03/2015 Document Reviewed: 10/02/2012 Elsevier Interactive Patient Education  2017 [employer]  

## 2017-03-14 NOTE — Patient Instructions (Signed)
[facility] Discharge Instructions for Patients Receiving Chemotherapy  Today you received the following chemotherapy agents: Carboplatin and Taxol  To help prevent nausea and vomiting after your treatment, we encourage you to take your nausea medication as directed   If you develop nausea and vomiting that is not controlled by your nausea medication, call the clinic.   BELOW ARE SYMPTOMS THAT SHOULD BE REPORTED IMMEDIATELY:  *FEVER GREATER THAN 100.5 F  *CHILLS WITH OR WITHOUT FEVER  NAUSEA AND VOMITING THAT IS NOT CONTROLLED WITH YOUR NAUSEA MEDICATION  *UNUSUAL SHORTNESS OF BREATH  *UNUSUAL BRUISING OR BLEEDING  TENDERNESS IN MOUTH AND THROAT WITH OR WITHOUT PRESENCE OF ULCERS  *URINARY PROBLEMS  *BOWEL PROBLEMS  UNUSUAL RASH Items with * indicate a potential emergency and should be followed up as soon as possible.  Feel free to call the clinic should you have any questions or concerns. The clinic phone number is [phone].  Please show the CHEMO ALERT CARD at check-in to the Emergency Department and triage nurse.   

## 2017-03-23 ENCOUNTER — Telehealth: Payer: Self-pay | Admitting: *Deleted

## 2017-03-23 NOTE — Telephone Encounter (Signed)
OTC nasacort for runny nose and robitussin cough syrup OTC

## 2017-03-23 NOTE — Telephone Encounter (Signed)
Patty, RN with [facility] left a message stating patient has a head cold, runny nose- occasional bloody nose. Upper respiratory chest congestion, but lungs are clear. Is requesting something for congestion.

## 2017-03-23 NOTE — Telephone Encounter (Signed)
Notified [facility] nurse of message below

## 2017-03-25 ENCOUNTER — Other Ambulatory Visit: Payer: Self-pay | Admitting: Hematology and Oncology

## 2017-03-25 ENCOUNTER — Ambulatory Visit (HOSPITAL_COMMUNITY)
Admission: RE | Admit: 2017-03-25 | Discharge: 2017-03-25 | Disposition: A | Discharge status: Home / Self Care | Payer: Medicare Other | Source: Ambulatory Visit | Attending: Hematology and Oncology | Admitting: Hematology and Oncology

## 2017-03-25 DIAGNOSIS — C787 Secondary malignant neoplasm of liver and intrahepatic bile duct: Secondary | ICD-10-CM | POA: Insufficient documentation

## 2017-03-25 DIAGNOSIS — C029 Malignant neoplasm of tongue, unspecified: Secondary | ICD-10-CM | POA: Insufficient documentation

## 2017-03-25 DIAGNOSIS — K8020 Calculus of gallbladder without cholecystitis without obstruction: Secondary | ICD-10-CM | POA: Diagnosis not present

## 2017-03-25 DIAGNOSIS — I2510 Atherosclerotic heart disease of native coronary artery without angina pectoris: Secondary | ICD-10-CM | POA: Diagnosis not present

## 2017-03-25 DIAGNOSIS — K5730 Diverticulosis of large intestine without perforation or abscess without bleeding: Secondary | ICD-10-CM | POA: Diagnosis not present

## 2017-03-25 DIAGNOSIS — N400 Enlarged prostate without lower urinary tract symptoms: Secondary | ICD-10-CM | POA: Diagnosis not present

## 2017-03-25 DIAGNOSIS — I700 Atherosclerosis of aorta: Secondary | ICD-10-CM | POA: Diagnosis not present

## 2017-03-25 LAB — GLUCOSE, CAPILLARY: GLUCOSE-CAPILLARY: 141 mg/dL — AB (ref 65–99)

## 2017-03-25 MED ORDER — FLUDEOXYGLUCOSE F - 18 (FDG) INJECTION
8.0600 | Freq: Once | INTRAVENOUS | Status: AC | PRN
  Administered 2017-03-25: 8.06 via INTRAVENOUS

## 2017-03-28 ENCOUNTER — Encounter: Payer: Self-pay | Admitting: Hematology and Oncology

## 2017-03-28 ENCOUNTER — Other Ambulatory Visit: Payer: Self-pay | Admitting: Hematology and Oncology

## 2017-03-28 ENCOUNTER — Inpatient Hospital Stay (HOSPITAL_BASED_OUTPATIENT_CLINIC_OR_DEPARTMENT_OTHER): Payer: Medicare Other | Admitting: Hematology and Oncology

## 2017-03-28 ENCOUNTER — Inpatient Hospital Stay: Payer: Medicare Other

## 2017-03-28 ENCOUNTER — Telehealth: Payer: Self-pay | Admitting: Hematology and Oncology

## 2017-03-28 VITALS — BP 120/76 | HR 94 | Temp 98.5°F | Resp 18 | Ht 68.0 in | Wt 161.3 lb

## 2017-03-28 DIAGNOSIS — C787 Secondary malignant neoplasm of liver and intrahepatic bile duct: Secondary | ICD-10-CM | POA: Diagnosis not present

## 2017-03-28 DIAGNOSIS — C770 Secondary and unspecified malignant neoplasm of lymph nodes of head, face and neck: Secondary | ICD-10-CM

## 2017-03-28 DIAGNOSIS — R634 Abnormal weight loss: Secondary | ICD-10-CM

## 2017-03-28 DIAGNOSIS — Z5111 Encounter for antineoplastic chemotherapy: Secondary | ICD-10-CM | POA: Diagnosis not present

## 2017-03-28 DIAGNOSIS — C029 Malignant neoplasm of tongue, unspecified: Secondary | ICD-10-CM

## 2017-03-28 DIAGNOSIS — C01 Malignant neoplasm of base of tongue: Secondary | ICD-10-CM

## 2017-03-28 DIAGNOSIS — R110 Nausea: Secondary | ICD-10-CM

## 2017-03-28 DIAGNOSIS — D61818 Other pancytopenia: Secondary | ICD-10-CM

## 2017-03-28 LAB — CBC WITH DIFFERENTIAL/PLATELET
BASOS ABS: 0 10*3/uL (ref 0.0–0.1)
BASOS PCT: 1 %
EOS PCT: 0 %
Eosinophils Absolute: 0 10*3/uL (ref 0.0–0.5)
HCT: 30.8 % — ABNORMAL LOW (ref 38.4–49.9)
Hemoglobin: 10.2 g/dL — ABNORMAL LOW (ref 13.0–17.1)
LYMPHS PCT: 23 %
Lymphs Abs: 0.3 10*3/uL — ABNORMAL LOW (ref 0.9–3.3)
MCH: 30.4 pg (ref 27.2–33.4)
MCHC: 33.1 g/dL (ref 32.0–36.0)
MCV: 91.7 fL (ref 79.3–98.0)
Monocytes Absolute: 0.3 10*3/uL (ref 0.1–0.9)
Monocytes Relative: 22 %
Neutro Abs: 0.8 10*3/uL — ABNORMAL LOW (ref 1.5–6.5)
Neutrophils Relative %: 54 %
PLATELETS: 164 10*3/uL (ref 140–400)
RBC: 3.36 MIL/uL — AB (ref 4.20–5.82)
RDW: 16.6 % — ABNORMAL HIGH (ref 11.0–14.6)
WBC: 1.4 10*3/uL — AB (ref 4.0–10.3)

## 2017-03-28 LAB — COMPREHENSIVE METABOLIC PANEL
ALK PHOS: 73 U/L (ref 40–150)
ALT: 12 U/L (ref 0–55)
ANION GAP: 11 (ref 3–11)
AST: 11 U/L (ref 5–34)
Albumin: 3.3 g/dL — ABNORMAL LOW (ref 3.5–5.0)
BILIRUBIN TOTAL: 0.3 mg/dL (ref 0.2–1.2)
BUN: 12 mg/dL (ref 7–26)
CALCIUM: 9.3 mg/dL (ref 8.4–10.4)
CO2: 22 mmol/L (ref 22–29)
CREATININE: 0.87 mg/dL (ref 0.70–1.30)
Chloride: 107 mmol/L (ref 98–109)
Glucose, Bld: 234 mg/dL — ABNORMAL HIGH (ref 70–140)
Potassium: 3.3 mmol/L — ABNORMAL LOW (ref 3.5–5.1)
SODIUM: 140 mmol/L (ref 136–145)
TOTAL PROTEIN: 6.3 g/dL — AB (ref 6.4–8.3)

## 2017-03-28 MED ORDER — SODIUM CHLORIDE 0.9% FLUSH
10.0000 mL | Freq: Once | INTRAVENOUS | Status: AC
  Administered 2017-03-28: 10 mL
  Filled 2017-03-28: qty 10

## 2017-03-28 MED ORDER — HEPARIN SOD (PORK) LOCK FLUSH 100 UNIT/ML IV SOLN
500.0000 [IU] | Freq: Once | INTRAVENOUS | Status: AC | PRN
  Administered 2017-03-28: 500 [IU]
  Filled 2017-03-28: qty 5

## 2017-03-28 MED ORDER — SODIUM CHLORIDE 0.9 % IJ SOLN
10.0000 mL | INTRAMUSCULAR | Status: DC | PRN
  Administered 2017-03-28: 10 mL
  Filled 2017-03-28: qty 10

## 2017-03-28 NOTE — Assessment & Plan Note (Addendum)
His weight loss has stabilized I recommend continue prednisone taper He will continue Remeron

## 2017-03-28 NOTE — Assessment & Plan Note (Signed)
He has significant pancytopenia, likely due to side effects of chemo The patient have sepsis episode in of last year I would reduce the dose of chemotherapy and will reschedule to next week I will add G-CSF support for future treatment

## 2017-03-28 NOTE — Assessment & Plan Note (Addendum)
The liver masses drinking We will continue treatment indefinitely

## 2017-03-28 NOTE — Patient Instructions (Signed)
Implanted Port Home Guide An implanted port is a type of central line that is placed under the skin. Central lines are used to provide IV access when treatment or nutrition needs to be given through a person's veins. Implanted ports are used for long-term IV access. An implanted port may be placed because:  You need IV medicine that would be irritating to the small veins in your hands or arms.  You need long-term IV medicines, such as antibiotics.  You need IV nutrition for a long period.  You need frequent blood draws for lab tests.  You need dialysis.  Implanted ports are usually placed in the chest area, but they can also be placed in the upper arm, the abdomen, or the leg. An implanted port has two main parts:  Reservoir. The reservoir is round and will appear as a small, raised area under your skin. The reservoir is the part where a needle is inserted to give medicines or draw blood.  Catheter. The catheter is a thin, flexible tube that extends from the reservoir. The catheter is placed into a large vein. Medicine that is inserted into the reservoir goes into the catheter and then into the vein.  How will I care for my incision site? Do not get the incision site wet. Bathe or shower as directed by your health care provider. How is my port accessed? Special steps must be taken to access the port:  Before the port is accessed, a numbing cream can be placed on the skin. This helps numb the skin over the port site.  Your health care provider uses a sterile technique to access the port. ? Your health care provider must put on a mask and sterile gloves. ? The skin over your port is cleaned carefully with an antiseptic and allowed to dry. ? The port is gently pinched between sterile gloves, and a needle is inserted into the port.  Only "non-coring" port needles should be used to access the port. Once the port is accessed, a blood return should be checked. This helps ensure that the port  is in the vein and is not clogged.  If your port needs to remain accessed for a constant infusion, a clear (transparent) bandage will be placed over the needle site. The bandage and needle will need to be changed every week, or as directed by your health care provider.  Keep the bandage covering the needle clean and dry. Do not get it wet. Follow your health care provider's instructions on how to take a shower or bath while the port is accessed.  If your port does not need to stay accessed, no bandage is needed over the port.  What is flushing? Flushing helps keep the port from getting clogged. Follow your health care provider's instructions on how and when to flush the port. Ports are usually flushed with saline solution or a medicine called heparin. The need for flushing will depend on how the port is used.  If the port is used for intermittent medicines or blood draws, the port will need to be flushed: ? After medicines have been given. ? After blood has been drawn. ? As part of routine maintenance.  If a constant infusion is running, the port may not need to be flushed.  How long will my port stay implanted? The port can stay in for as long as your health care provider thinks it is needed. When it is time for the port to come out, surgery will be   done to remove it. The procedure is similar to the one performed when the port was put in. When should I seek immediate medical care? When you have an implanted port, you should seek immediate medical care if:  You notice a bad smell coming from the incision site.  You have swelling, redness, or drainage at the incision site.  You have more swelling or pain at the port site or the surrounding area.  You have a fever that is not controlled with medicine.  This information is not intended to replace advice given to you by your health care provider. Make sure you discuss any questions you have with your health care provider. Document  Released: 01/25/2005 Document Revised: 07/03/2015 Document Reviewed: 10/02/2012 Elsevier Interactive Patient Education  2017 [employer]  

## 2017-03-28 NOTE — Progress Notes (Signed)
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 Unfortunately, he has significant pancytopenia We discussed different strategies including dose reduction, changing chemo schedule and add Neulasta Ultimately, he agreed to keep his appointment for chemotherapy schedule to be treated on days 1, 8 and rest day 15, for cycle 21 days He is interested to get Neulasta added We will get insurance prior authorization for Neulasta for future cycle I will skip treatment today and rescheduled to next week  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 SCHEDULING COMMUNICATION INJECTION    Schedule 15 minute injection appointment    INTERVAL HISTORY: Please see below for problem oriented charting. He returns with his wife for further chemotherapy and review of PET CT scan He had recent cold-like illness with nasal congestion and diarrhea He denies fever or  chills Denies peripheral neuropathy He continues to have good appetite and weight gain with prednisone therapy His endocrinologist had discontinue his insulin recently due to persistent low blood sugar.  He was noted to have mild proteinuria  SUMMARY OF ONCOLOGIC HISTORY:   Tongue cancer ([place])   08/20/2015 Miscellaneous    He was evaluated by ENT      08/22/2015 Imaging    CT neck showed malignant adenopathy right supraclavicular region compatible with metastatic disease. Biopsy recommended. No definite pharyngeal mass lesion identified by CT.       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 No other primary or metastatic cancer. 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         11/13/2015 Procedure    Placement of 20 French pull-through percutaneous gastrostomy tube.      03/11/2016 PET scan    Resolution of hypermetabolic right cervical lymphadenopathy since prior study . No other hypermetabolic lymphadenopathy identified. New hypermetabolic lesion and caudate lobe of liver, highly suspicious for liver metastasis. Recommend liver protocol abdomen MRI without and with contrast for further evaluation. New patchy airspace disease  in both lung apices with FDG uptake. 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 Recommend continued attention on follow-up imaging.       03/19/2016 Imaging    MRI showed 3.4 cm heterogeneously enhancing mass  in caudate lobe of liver, consistent liver metastasis. No other sites of metastatic disease identified within the liver or abdomen      03/26/2016 Procedure    Ultrasound and fluoroscopically guided right internal jugular single lumen power port catheter insertion. Tip in the SVC/RA junction. 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 Metabolic response to therapy of caudate lobe liver metastasis. Low-level activity in this area is similar to the surrounding liver.  2. Hypermetabolism in the right inguinal crease without concurrent adenopathy. Especially given subtle surrounding edema, favor cellulitis. Consider physical exam correlation. 3. Lack of visualization of previously described 7 mm left lower lobe pulmonary nodule. 4. Diffuse marrow hypermetabolism, favoring stimulation by chemotherapy.      09/20/2016 PET scan    1. Reduced activity in the hypermetabolic caudate lobe lesion of the liver, likely a metastatic lesion. 2. Scattered likely inflammatory activity along the glenohumeral and acromioclavicular joints and in the hips. 3. Healing of bilateral upper rib fractures. 4. Other imaging findings of potential clinical significance: Aortic Atherosclerosis (ICD10-I70.0). Coronary and carotid atherosclerosis. Photopenic right renal cysts.      11/17/2016 Procedure    Successful removal of 20 French pull-through gastrostomy tube.      12/09/2016 PET scan    1. Slight increase in size and degree of uptake associated with caudate lobe lesion consistent with metastatic  disease 2. Increase in size and FDG uptake associated with portacaval lymph node 3. Aortic Atherosclerosis (ICD10-I70.0). Coronary atherosclerotic changes noted.      12/27/2016 -  Chemotherapy    He received carboplatin and taxol      01/19/2017 - 01/25/2017 Hospital Admission    He was admitted to the hospital with sepsis secondary to cholangitis, managed conservatively      01/21/2017 Imaging    1. Cholelithiasis with gallbladder distension and subtle pericholecystic edema. Findings suspicious for acute cholecystitis. Right upper quadrant ultrasound could confirm. 2. Similar caudate lobe metastasis. Similar borderline porta hepatis adenopathy. 3. Left nephrolithiasis. 4. New tiny right pleural effusion with bibasilar atelectasis. 5. Trace air in the nondependent urinary bladder. Correlate with instrumentation. 6. Coronary artery atherosclerosis. Aortic Atherosclerosis (ICD10-I70.0).        03/25/2017 PET scan    1. Reduced size and significantly reduced activity in the hepatic caudate lobe metastatic lesion. Significantly reduced activity of the adjacent porta hepatis lymph node. Appearance compatible with interval effective therapy. 2. Ground-glass and sub solid densities anteriorly in the right upper lobe appear to conform to several secondary pulmonary lobules and are new compared to the right our prior exam. Low-grade metabolic activity at 2.7. Probably alveolitis/low-grade pneumonia, the morphology would be atypical for metastatic disease, but surveillance is suggested. 3. There is some focal  accentuated metabolic activity at the sigmoid colon anastomotic site. No CT correlate. There is no abnormal activity in this vicinity on 12/09/2016 and I suspect that this is physiologic, but this area also warrant surveillance. 4. Other imaging findings of potential clinical significance: Aortic Atherosclerosis (ICD10-I70.0). Coronary atherosclerosis. Cholelithiasis potentially with minimal  gallbladder wall thickening. Sigmoid colon diverticulosis. Prostatomegaly.       Cancer of base of tongue (HCC)    REVIEW OF SYSTEMS:   Constitutional: Denies fevers, chills or abnormal weight loss Eyes: Denies blurriness of vision Ears, nose, mouth, throat, and face: Denies mucositis or sore throat Respiratory: Denies cough, dyspnea or wheezes Cardiovascular: Denies palpitation, chest discomfort or lower extremity swelling Gastrointestinal:  Denies nausea, heartburn or change in bowel habits Skin: Denies abnormal skin rashes Lymphatics: Denies new lymphadenopathy or easy bruising Neurological:Denies numbness, tingling or new weaknesses Behavioral/Psych: Mood is stable, no new changes  All other systems were reviewed with the patient and are negative.  I have reviewed the past medical history, past surgical history, social history and family history with the patient and they are unchanged from previous note.  ALLERGIES:  is allergic to cetuximab.  MEDICATIONS:  Current Outpatient Medications  Medication Sig Dispense Refill  . clopidogrel (PLAVIX) 75 MG tablet TAKE 1 TABLET EVERY DAY 90 tablet 2  . insulin glargine (LANTUS) 100 UNIT/ML injection Inject 0.5 mLs (50 Units total) into the skin at bedtime. 10 mL   . mirtazapine (REMERON) 30 MG tablet Take 1 tablet (30 mg total) by mouth at bedtime. 90 tablet 1  . Multiple Vitamin (MULTIVITAMIN WITH MINERALS) TABS tablet Take 1 tablet by mouth daily. 30 tablet 0  . ondansetron (ZOFRAN) 8 MG tablet Take 1 tablet (8 mg total) by mouth every 8 (eight) hours as needed for nausea or vomiting. 60 tablet 11  . polyethylene glycol (MIRALAX / GLYCOLAX) packet Take 17 g by mouth daily as needed. 14 each 0  . predniSONE (DELTASONE) 10 MG tablet Take 1 tablet (10 mg total) by mouth daily with breakfast. 30 tablet 1   Current Facility-Administered Medications  Medication Dose Route Frequency Provider Last Rate Last Dose  . sodium chloride 0.9 %  injection 10 mL  10 mL Intracatheter PRN Heath Lark, MD   10 mL at 03/28/17 0913    PHYSICAL EXAMINATION: ECOG PERFORMANCE STATUS: 1 - Symptomatic but completely ambulatory  Vitals:   03/28/17 0824  BP: 120/76  Pulse: 94  Resp: 18  Temp: 98.5 F (36.9 C)  SpO2: 100%   Filed Weights   03/28/17 0824  Weight: 161 lb 4.8 oz (73.2 kg)    GENERAL:alert, no distress and comfortable SKIN: skin color, texture, turgor are normal, no rashes or significant lesions EYES: normal, Conjunctiva are pink and non-injected, sclera clear NEURO: alert & oriented x 3 with fluent speech, no focal motor/sensory deficits  LABORATORY DATA:  I have reviewed the data as listed    Component Value Date/Time   NA 140 03/28/2017 0731   NA 140 02/07/2017 0811   K 3.3 (L) 03/28/2017 0731   K 3.9 02/07/2017 0811   CL 107 03/28/2017 0731   CO2 22 03/28/2017 0731   CO2 25 02/07/2017 0811   GLUCOSE 234 (H) 03/28/2017 0731   GLUCOSE 118 02/07/2017 0811   BUN 12 03/28/2017 0731   BUN 17.8 02/07/2017 0811   CREATININE 0.87 03/28/2017 0731   CREATININE 1.0 02/07/2017 0811   CALCIUM 9.3 03/28/2017 0731   CALCIUM 9.4 02/07/2017 4401  PROT 6.3 (L) 03/28/2017 0731   PROT 6.7 02/07/2017 0811   ALBUMIN 3.3 (L) 03/28/2017 0731   ALBUMIN 3.2 (L) 02/07/2017 0811   AST 11 03/28/2017 0731   AST 16 02/07/2017 0811   ALT 12 03/28/2017 0731   ALT 17 02/07/2017 0811   ALKPHOS 73 03/28/2017 0731   ALKPHOS 144 02/07/2017 0811   BILITOT 0.3 03/28/2017 0731   BILITOT 0.70 02/07/2017 0811   GFRNONAA >60 03/28/2017 0731   GFRAA >60 03/28/2017 0731    No results found for: SPEP, UPEP  Lab Results  Component Value Date   WBC 1.4 (L) 03/28/2017   NEUTROABS 0.8 (L) 03/28/2017   HGB 10.2 (L) 03/28/2017   HCT 30.8 (L) 03/28/2017   MCV 91.7 03/28/2017   PLT 164 03/28/2017      Chemistry      Component Value Date/Time   NA 140 03/28/2017 0731   NA 140 02/07/2017 0811   K 3.3 (L) 03/28/2017 0731   K 3.9  02/07/2017 0811   CL 107 03/28/2017 0731   CO2 22 03/28/2017 0731   CO2 25 02/07/2017 0811   BUN 12 03/28/2017 0731   BUN 17.8 02/07/2017 0811   CREATININE 0.87 03/28/2017 0731   CREATININE 1.0 02/07/2017 0811   GLU 78 11/13/2015 1134      Component Value Date/Time   CALCIUM 9.3 03/28/2017 0731   CALCIUM 9.4 02/07/2017 0811   ALKPHOS 73 03/28/2017 0731   ALKPHOS 144 02/07/2017 0811   AST 11 03/28/2017 0731   AST 16 02/07/2017 0811   ALT 12 03/28/2017 0731   ALT 17 02/07/2017 0811   BILITOT 0.3 03/28/2017 0731   BILITOT 0.70 02/07/2017 0811       RADIOGRAPHIC STUDIES: I have reviewed imaging study with the patient I have personally reviewed the radiological images as listed and agreed with the findings in the report. Nm Pet Image Restag (ps) Skull Base To Thigh  Result Date: 03/25/2017 CLINICAL DATA:  Subsequent treatment strategy for head and neck cancer (tongue). EXAM: NUCLEAR MEDICINE PET SKULL BASE TO THIGH TECHNIQUE: 8.1 mCi F-18 FDG was injected intravenously. Full-ring PET imaging was performed from the skull base to thigh after the radiotracer. CT data was obtained and used for attenuation correction and anatomic localization. FASTING BLOOD GLUCOSE:  Value: 141 mg/dl COMPARISON:  Multiple exams, including 12/09/2016 FINDINGS: NECK No hypermetabolic lymph nodes in the neck. No significant abnormal accentuated metabolic activity along the tongue. Bilateral carotid atherosclerotic calcification. CHEST No hypermetabolic mediastinal or hilar nodes. Patchy ground-glass opacities anteriorly in the right upper lobe adjacent to the right first rib on images 9 through 15 of series 8 appear increased from 12/09/2016. A sub solid component measures 1.4 by 1.1 cm on image 10/8 and appears to conform to a secondary pulmonary lobule. A slightly more dense lesion measures 2.0 by 1.1 cm on image 13/8. Mildly associated accentuated metabolic activity, maximum SUV 2.7. Coronary, aortic arch, and  branch vessel atherosclerotic vascular disease. Right Port-A-Cath tip: Cavoatrial junction. ABDOMEN/PELVIS Significant improvement in the caudate lobe metastatic lesion, associated hypodensity 3.0 by 1.7 cm (formerly 3.2 by 2.3 cm) with maximum SUV 4.1 (formerly 11.9). An adjacent porta hepatis node measures 1.4 cm in short axis (previously 1.3 cm) with maximum SUV 3.2 (formerly 5.7). Accentuated activity in the vicinity of a sigmoid proximal sigmoid colon anastomotic site, maximum SUV 6.2 and formerly 4.0 in this vicinity. Cholelithiasis. Potential mild wall thickening in the gallbladder. Photopenic right kidney upper pole cyst. Aortoiliac  atherosclerotic vascular disease. Sigmoid colon diverticulosis. Prostatomegaly. SKELETON No focal hypermetabolic activity to suggest skeletal metastasis. IMPRESSION: 1. Reduced size and significantly reduced activity in the hepatic caudate lobe metastatic lesion. Significantly reduced activity of the adjacent porta hepatis lymph node. Appearance compatible with interval effective therapy. 2. Ground-glass and sub solid densities anteriorly in the right upper lobe appear to conform to several secondary pulmonary lobules and are new compared to the right our prior exam. Low-grade metabolic activity at 2.7. Probably alveolitis/low-grade pneumonia, the morphology would be atypical for metastatic disease, but surveillance is suggested. 3. There is some focal accentuated metabolic activity at the sigmoid colon anastomotic site. No CT correlate. There is no abnormal activity in this vicinity on 12/09/2016 and I suspect that this is physiologic, but this area also warrant surveillance. 4. Other imaging findings of potential clinical significance: Aortic Atherosclerosis (ICD10-I70.0). Coronary atherosclerosis. Cholelithiasis potentially with minimal gallbladder wall thickening. Sigmoid colon diverticulosis. Prostatomegaly. Electronically Signed   By: Van Clines M.D.   On:  03/25/2017 10:26    All questions were answered. The patient knows to call the clinic with any problems, questions or concerns. No barriers to learning was detected.  I spent 25 minutes counseling the patient face to face. The total time spent in the appointment was 40 minutes and more than 50% was on counseling and review of test results  Heath Lark, MD 03/28/2017 9:57 AM

## 2017-03-28 NOTE — Telephone Encounter (Signed)
Gave avs and calendar for march °

## 2017-03-28 NOTE — Assessment & Plan Note (Signed)
PET CT scan showed positive response to treatment Unfortunately, he has significant pancytopenia We discussed different strategies including dose reduction, changing chemo schedule and add Neulasta Ultimately, he agreed to keep his appointment for chemotherapy schedule to be treated on days 1, 8 and rest day 15, for cycle 21 days He is interested to get Neulasta added We will get insurance prior authorization for Neulasta for future cycle I will skip treatment today and rescheduled to next week

## 2017-03-31 ENCOUNTER — Other Ambulatory Visit: Payer: Self-pay | Admitting: Hematology and Oncology

## 2017-04-04 ENCOUNTER — Inpatient Hospital Stay: Payer: Medicare Other

## 2017-04-04 ENCOUNTER — Other Ambulatory Visit: Payer: Self-pay | Admitting: *Deleted

## 2017-04-04 ENCOUNTER — Ambulatory Visit: Payer: Medicare Other | Admitting: Nutrition

## 2017-04-04 ENCOUNTER — Other Ambulatory Visit: Payer: Self-pay | Admitting: Hematology and Oncology

## 2017-04-04 VITALS — BP 128/70 | HR 84 | Temp 97.8°F | Resp 17 | Ht 68.0 in | Wt 157.5 lb

## 2017-04-04 DIAGNOSIS — C770 Secondary and unspecified malignant neoplasm of lymph nodes of head, face and neck: Secondary | ICD-10-CM

## 2017-04-04 DIAGNOSIS — C787 Secondary malignant neoplasm of liver and intrahepatic bile duct: Secondary | ICD-10-CM

## 2017-04-04 DIAGNOSIS — C01 Malignant neoplasm of base of tongue: Secondary | ICD-10-CM

## 2017-04-04 DIAGNOSIS — C029 Malignant neoplasm of tongue, unspecified: Secondary | ICD-10-CM

## 2017-04-04 DIAGNOSIS — R110 Nausea: Secondary | ICD-10-CM

## 2017-04-04 DIAGNOSIS — Z5111 Encounter for antineoplastic chemotherapy: Secondary | ICD-10-CM | POA: Diagnosis not present

## 2017-04-04 LAB — COMPREHENSIVE METABOLIC PANEL
ALK PHOS: 71 U/L (ref 40–150)
ALT: 17 U/L (ref 0–55)
AST: 14 U/L (ref 5–34)
Albumin: 3.4 g/dL — ABNORMAL LOW (ref 3.5–5.0)
Anion gap: 12 — ABNORMAL HIGH (ref 3–11)
BILIRUBIN TOTAL: 0.4 mg/dL (ref 0.2–1.2)
BUN: 15 mg/dL (ref 7–26)
CALCIUM: 9.6 mg/dL (ref 8.4–10.4)
CO2: 25 mmol/L (ref 22–29)
CREATININE: 0.81 mg/dL (ref 0.70–1.30)
Chloride: 105 mmol/L (ref 98–109)
GFR calc Af Amer: >60 mL/min (ref >60)
GFR calc non Af Amer: >60 mL/min (ref >60)
Glucose, Bld: 142 mg/dL — ABNORMAL HIGH (ref 70–140)
Potassium: 3.3 mmol/L — ABNORMAL LOW (ref 3.5–5.1)
Sodium: 142 mmol/L (ref 136–145)
TOTAL PROTEIN: 6.4 g/dL (ref 6.4–8.3)

## 2017-04-04 LAB — CBC WITH DIFFERENTIAL/PLATELET
BASOS PCT: 0 %
Basophils Absolute: 0 10*3/uL (ref 0.0–0.1)
Eosinophils Absolute: 0 10*3/uL (ref 0.0–0.5)
Eosinophils Relative: 0 %
HEMATOCRIT: 33.6 % — AB (ref 38.4–49.9)
Hemoglobin: 11.1 g/dL — ABNORMAL LOW (ref 13.0–17.1)
LYMPHS ABS: 0.6 10*3/uL — AB (ref 0.9–3.3)
Lymphocytes Relative: 13 %
MCH: 30.7 pg (ref 27.2–33.4)
MCHC: 33 g/dL (ref 32.0–36.0)
MCV: 93.1 fL (ref 79.3–98.0)
MONO ABS: 0.6 10*3/uL (ref 0.1–0.9)
MONOS PCT: 13 %
NEUTROS ABS: 3.4 10*3/uL (ref 1.5–6.5)
Neutrophils Relative %: 74 %
Platelets: 177 10*3/uL (ref 140–400)
RBC: 3.61 MIL/uL — ABNORMAL LOW (ref 4.20–5.82)
RDW: 17.5 % — AB (ref 11.0–14.6)
WBC: 4.6 10*3/uL (ref 4.0–10.3)

## 2017-04-04 MED ORDER — SODIUM CHLORIDE 0.9% FLUSH
10.0000 mL | INTRAVENOUS | Status: DC | PRN
  Administered 2017-04-04: 10 mL
  Filled 2017-04-04: qty 10

## 2017-04-04 MED ORDER — SODIUM CHLORIDE 0.9% FLUSH
10.0000 mL | Freq: Once | INTRAVENOUS | Status: AC
  Administered 2017-04-04: 10 mL
  Filled 2017-04-04: qty 10

## 2017-04-04 MED ORDER — SODIUM CHLORIDE 0.9 % IV SOLN
20.0000 mg | Freq: Once | INTRAVENOUS | Status: AC
  Administered 2017-04-04: 20 mg via INTRAVENOUS
  Filled 2017-04-04: qty 2

## 2017-04-04 MED ORDER — DIPHENHYDRAMINE HCL 50 MG/ML IJ SOLN
50.0000 mg | Freq: Once | INTRAMUSCULAR | Status: AC
  Administered 2017-04-04: 50 mg via INTRAVENOUS

## 2017-04-04 MED ORDER — FAMOTIDINE IN NACL 20-0.9 MG/50ML-% IV SOLN: 20.0000 mg | Freq: Once | INTRAVENOUS | Status: DC

## 2017-04-04 MED ORDER — DIPHENHYDRAMINE HCL 50 MG/ML IJ SOLN
INTRAMUSCULAR | Status: AC
  Filled 2017-04-04: qty 1

## 2017-04-04 MED ORDER — SODIUM CHLORIDE 0.9 % IV SOLN
Freq: Once | INTRAVENOUS | Status: AC
  Administered 2017-04-04: 09:00:00 via INTRAVENOUS

## 2017-04-04 MED ORDER — PALONOSETRON HCL INJECTION 0.25 MG/5ML
0.2500 mg | Freq: Once | INTRAVENOUS | Status: AC
  Administered 2017-04-04: 0.25 mg via INTRAVENOUS

## 2017-04-04 MED ORDER — HEPARIN SOD (PORK) LOCK FLUSH 100 UNIT/ML IV SOLN
500.0000 [IU] | Freq: Once | INTRAVENOUS | Status: AC | PRN
  Administered 2017-04-04: 500 [IU]
  Filled 2017-04-04: qty 5

## 2017-04-04 MED ORDER — PALONOSETRON HCL INJECTION 0.25 MG/5ML
INTRAVENOUS | Status: AC
  Filled 2017-04-04: qty 5

## 2017-04-04 MED ORDER — SODIUM CHLORIDE 0.9 % IV SOLN
159.0400 mg | Freq: Once | INTRAVENOUS | Status: AC
  Administered 2017-04-04: 160 mg via INTRAVENOUS
  Filled 2017-04-04: qty 16

## 2017-04-04 MED ORDER — SODIUM CHLORIDE 0.9 % IV SOLN
36.0000 mg/m2 | Freq: Once | INTRAVENOUS | Status: AC
  Administered 2017-04-04: 72 mg via INTRAVENOUS
  Filled 2017-04-04: qty 12

## 2017-04-04 NOTE — Patient Instructions (Signed)
[facility] Discharge Instructions for Patients Receiving Chemotherapy  Today you received the following chemotherapy agents: Paclitaxel (Taxol) and Carboplatin (Paraplatin).  To help prevent nausea and vomiting after your treatment, we encourage you to take your nausea medication as prescribed. Received Aloxi during treatment-->take Compazine (not Zofran) for the next 3 days.  If you develop nausea and vomiting that is not controlled by your nausea medication, call the clinic.   BELOW ARE SYMPTOMS THAT SHOULD BE REPORTED IMMEDIATELY:  *FEVER GREATER THAN 100.5 F  *CHILLS WITH OR WITHOUT FEVER  NAUSEA AND VOMITING THAT IS NOT CONTROLLED WITH YOUR NAUSEA MEDICATION  *UNUSUAL SHORTNESS OF BREATH  *UNUSUAL BRUISING OR BLEEDING  TENDERNESS IN MOUTH AND THROAT WITH OR WITHOUT PRESENCE OF ULCERS  *URINARY PROBLEMS  *BOWEL PROBLEMS  UNUSUAL RASH Items with * indicate a potential emergency and should be followed up as soon as possible.  Feel free to call the clinic should you have any questions or concerns. The clinic phone number is [phone].  Please show the CHEMO ALERT CARD at check-in to the Emergency Department and triage nurse.   

## 2017-04-04 NOTE — Progress Notes (Signed)
Brief nutrition follow-up completed with patient and his wife during chemotherapy for metastatic tonsil cancer. Patient reports his weight has stabilized. He is drinking oral nutrition supplements as tolerated. Is pleased that his treatment dosage was reduced. Patient denies nutrition questions today.   I provided additional coupons. Patient has my contact information for questions or concerns.  **Disclaimer: This note was dictated with voice recognition software. Similar sounding words can inadvertently be transcribed and this note may contain transcription errors which may not have been corrected upon publication of note.**

## 2017-04-11 ENCOUNTER — Encounter: Payer: Self-pay | Admitting: Hematology and Oncology

## 2017-04-11 ENCOUNTER — Inpatient Hospital Stay (HOSPITAL_BASED_OUTPATIENT_CLINIC_OR_DEPARTMENT_OTHER): Payer: Medicare Other | Admitting: Hematology and Oncology

## 2017-04-11 ENCOUNTER — Inpatient Hospital Stay: Payer: Medicare Other | Attending: Hematology and Oncology

## 2017-04-11 ENCOUNTER — Inpatient Hospital Stay: Payer: Medicare Other

## 2017-04-11 DIAGNOSIS — R110 Nausea: Secondary | ICD-10-CM

## 2017-04-11 DIAGNOSIS — C01 Malignant neoplasm of base of tongue: Secondary | ICD-10-CM

## 2017-04-11 DIAGNOSIS — Z5111 Encounter for antineoplastic chemotherapy: Secondary | ICD-10-CM | POA: Insufficient documentation

## 2017-04-11 DIAGNOSIS — C787 Secondary malignant neoplasm of liver and intrahepatic bile duct: Secondary | ICD-10-CM | POA: Insufficient documentation

## 2017-04-11 DIAGNOSIS — F329 Major depressive disorder, single episode, unspecified: Secondary | ICD-10-CM

## 2017-04-11 DIAGNOSIS — D61818 Other pancytopenia: Secondary | ICD-10-CM | POA: Diagnosis not present

## 2017-04-11 DIAGNOSIS — R634 Abnormal weight loss: Secondary | ICD-10-CM | POA: Diagnosis not present

## 2017-04-11 DIAGNOSIS — G620 Drug-induced polyneuropathy: Secondary | ICD-10-CM | POA: Insufficient documentation

## 2017-04-11 DIAGNOSIS — C029 Malignant neoplasm of tongue, unspecified: Secondary | ICD-10-CM

## 2017-04-11 DIAGNOSIS — F32A Depression, unspecified: Secondary | ICD-10-CM

## 2017-04-11 DIAGNOSIS — E1140 Type 2 diabetes mellitus with diabetic neuropathy, unspecified: Secondary | ICD-10-CM | POA: Insufficient documentation

## 2017-04-11 DIAGNOSIS — Z794 Long term (current) use of insulin: Secondary | ICD-10-CM | POA: Insufficient documentation

## 2017-04-11 DIAGNOSIS — T451X5A Adverse effect of antineoplastic and immunosuppressive drugs, initial encounter: Secondary | ICD-10-CM

## 2017-04-11 DIAGNOSIS — C770 Secondary and unspecified malignant neoplasm of lymph nodes of head, face and neck: Secondary | ICD-10-CM

## 2017-04-11 DIAGNOSIS — Z5189 Encounter for other specified aftercare: Secondary | ICD-10-CM | POA: Insufficient documentation

## 2017-04-11 LAB — COMPREHENSIVE METABOLIC PANEL
ALK PHOS: 70 U/L (ref 40–150)
ALT: 13 U/L (ref 0–55)
AST: 11 U/L (ref 5–34)
Albumin: 3.3 g/dL — ABNORMAL LOW (ref 3.5–5.0)
Anion gap: 12 — ABNORMAL HIGH (ref 3–11)
BUN: 18 mg/dL (ref 7–26)
CALCIUM: 9.3 mg/dL (ref 8.4–10.4)
CHLORIDE: 105 mmol/L (ref 98–109)
CO2: 22 mmol/L (ref 22–29)
CREATININE: 0.86 mg/dL (ref 0.70–1.30)
GFR calc Af Amer: >60 mL/min (ref >60)
Glucose, Bld: 343 mg/dL — ABNORMAL HIGH (ref 70–140)
Potassium: 3.8 mmol/L (ref 3.5–5.1)
Sodium: 139 mmol/L (ref 136–145)
Total Bilirubin: 0.4 mg/dL (ref 0.2–1.2)
Total Protein: 6.1 g/dL — ABNORMAL LOW (ref 6.4–8.3)

## 2017-04-11 LAB — CBC WITH DIFFERENTIAL/PLATELET
Basophils Absolute: 0 10*3/uL (ref 0.0–0.1)
Basophils Relative: 0 %
EOS PCT: 0 %
Eosinophils Absolute: 0 10*3/uL (ref 0.0–0.5)
HCT: 33.8 % — ABNORMAL LOW (ref 38.4–49.9)
Hemoglobin: 11.1 g/dL — ABNORMAL LOW (ref 13.0–17.1)
LYMPHS PCT: 7 %
Lymphs Abs: 0.5 10*3/uL — ABNORMAL LOW (ref 0.9–3.3)
MCH: 30.8 pg (ref 27.2–33.4)
MCHC: 32.8 g/dL (ref 32.0–36.0)
MCV: 93.9 fL (ref 79.3–98.0)
MONO ABS: 0.3 10*3/uL (ref 0.1–0.9)
Monocytes Relative: 5 %
Neutro Abs: 5.4 10*3/uL (ref 1.5–6.5)
Neutrophils Relative %: 88 %
PLATELETS: 157 10*3/uL (ref 140–400)
RBC: 3.6 MIL/uL — ABNORMAL LOW (ref 4.20–5.82)
RDW: 16.6 % — ABNORMAL HIGH (ref 11.0–14.6)
Smear Review: 1
WBC: 6.2 10*3/uL (ref 4.0–10.3)

## 2017-04-11 MED ORDER — PALONOSETRON HCL INJECTION 0.25 MG/5ML
0.2500 mg | Freq: Once | INTRAVENOUS | Status: AC
  Administered 2017-04-11: 0.25 mg via INTRAVENOUS

## 2017-04-11 MED ORDER — SODIUM CHLORIDE 0.9 % IV SOLN
36.0000 mg/m2 | Freq: Once | INTRAVENOUS | Status: AC
  Administered 2017-04-11: 72 mg via INTRAVENOUS
  Filled 2017-04-11: qty 12

## 2017-04-11 MED ORDER — SODIUM CHLORIDE 0.9 % IV SOLN
20.0000 mg | Freq: Once | INTRAVENOUS | Status: AC
  Administered 2017-04-11: 20 mg via INTRAVENOUS
  Filled 2017-04-11: qty 2

## 2017-04-11 MED ORDER — SODIUM CHLORIDE 0.9 % IV SOLN
Freq: Once | INTRAVENOUS | Status: AC
  Administered 2017-04-11: 10:00:00 via INTRAVENOUS

## 2017-04-11 MED ORDER — SODIUM CHLORIDE 0.9 % IV SOLN
160.0000 mg | Freq: Once | INTRAVENOUS | Status: AC
  Administered 2017-04-11: 160 mg via INTRAVENOUS
  Filled 2017-04-11: qty 16

## 2017-04-11 MED ORDER — MIRTAZAPINE 30 MG PO TABS: 30.0000 mg | ORAL_TABLET | Freq: Every day | ORAL | 11 refills | Status: DC

## 2017-04-11 MED ORDER — DIPHENHYDRAMINE HCL 50 MG/ML IJ SOLN
50.0000 mg | Freq: Once | INTRAMUSCULAR | Status: AC
  Administered 2017-04-11: 50 mg via INTRAVENOUS

## 2017-04-11 MED ORDER — PALONOSETRON HCL INJECTION 0.25 MG/5ML
INTRAVENOUS | Status: AC
  Filled 2017-04-11: qty 5

## 2017-04-11 MED ORDER — HEPARIN SOD (PORK) LOCK FLUSH 100 UNIT/ML IV SOLN
500.0000 [IU] | Freq: Once | INTRAVENOUS | Status: AC | PRN
  Administered 2017-04-11: 500 [IU]
  Filled 2017-04-11: qty 5

## 2017-04-11 MED ORDER — SODIUM CHLORIDE 0.9% FLUSH
10.0000 mL | INTRAVENOUS | Status: DC | PRN
  Administered 2017-04-11: 10 mL
  Filled 2017-04-11: qty 10

## 2017-04-11 MED ORDER — FAMOTIDINE IN NACL 20-0.9 MG/50ML-% IV SOLN: 20.0000 mg | Freq: Once | INTRAVENOUS | Status: DC

## 2017-04-11 MED ORDER — DEXAMETHASONE SODIUM PHOSPHATE 100 MG/10ML IJ SOLN
20.0000 mg | Freq: Once | INTRAMUSCULAR | Status: AC
  Administered 2017-04-11: 20 mg via INTRAVENOUS
  Filled 2017-04-11: qty 2

## 2017-04-11 MED ORDER — DIPHENHYDRAMINE HCL 50 MG/ML IJ SOLN
INTRAMUSCULAR | Status: AC
  Filled 2017-04-11: qty 1

## 2017-04-11 MED ORDER — PEGFILGRASTIM-CBQV 6 MG/0.6ML ~~LOC~~ SOSY: 6.0000 mg | PREFILLED_SYRINGE | Freq: Once | SUBCUTANEOUS | Status: DC

## 2017-04-11 MED ORDER — SODIUM CHLORIDE 0.9 % IJ SOLN
10.0000 mL | INTRAMUSCULAR | Status: DC | PRN
  Administered 2017-04-11: 10 mL
  Filled 2017-04-11: qty 10

## 2017-04-11 NOTE — Assessment & Plan Note (Signed)
PET CT scan showed positive response to treatment Unfortunately, he has significant pancytopenia We discussed different strategies including dose reduction, changing chemo schedule and add Neulasta Ultimately, he agreed to keep his appointment for chemotherapy schedule to be treated on days 1, 8 and rest day 15, for cycle 21 days He is interested to get Neulasta added

## 2017-04-11 NOTE — Assessment & Plan Note (Signed)
His weight loss has stabilized I recommend continue prednisone taper He will continue Remeron

## 2017-04-11 NOTE — Assessment & Plan Note (Signed)
he has mild peripheral neuropathy, likely related to side effects of treatment and related to his diabetes It is only mild, not bothering the patient. I will observe for now If it gets worse in the future, I will consider modifying the dose of the treatment

## 2017-04-11 NOTE — Patient Instructions (Signed)
[facility] Discharge Instructions for Patients Receiving Chemotherapy  Today you received the following chemotherapy agents: Paclitaxel (Taxol) and Carboplatin (Paraplatin).  To help prevent nausea and vomiting after your treatment, we encourage you to take your nausea medication as prescribed. Received Aloxi during treatment-->take Compazine (not Zofran) for the next 3 days.  If you develop nausea and vomiting that is not controlled by your nausea medication, call the clinic.   BELOW ARE SYMPTOMS THAT SHOULD BE REPORTED IMMEDIATELY:  *FEVER GREATER THAN 100.5 F  *CHILLS WITH OR WITHOUT FEVER  NAUSEA AND VOMITING THAT IS NOT CONTROLLED WITH YOUR NAUSEA MEDICATION  *UNUSUAL SHORTNESS OF BREATH  *UNUSUAL BRUISING OR BLEEDING  TENDERNESS IN MOUTH AND THROAT WITH OR WITHOUT PRESENCE OF ULCERS  *URINARY PROBLEMS  *BOWEL PROBLEMS  UNUSUAL RASH Items with * indicate a potential emergency and should be followed up as soon as possible.  Feel free to call the clinic should you have any questions or concerns. The clinic phone number is [phone].  Please show the CHEMO ALERT CARD at check-in to the Emergency Department and triage nurse.   

## 2017-04-11 NOTE — Assessment & Plan Note (Signed)
His blood sugar will remain high as long as he is on prednisone therapy We will continue to monitor carefully and he will take insulin as needed I recommend prednisone taper soon

## 2017-04-11 NOTE — Progress Notes (Signed)
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 Unfortunately, he has significant pancytopenia We discussed different strategies including dose reduction, changing chemo schedule and add Neulasta Ultimately, he agreed to keep his appointment for chemotherapy schedule to be treated on days 1, 8 and rest day 15, for cycle 21 days He is interested to get Neulasta added  Diabetes mellitus with diabetic neuropathy, with long-term current use of insulin ([place]) His blood sugar will remain high as long as he is on prednisone therapy We will continue to monitor carefully and he will take insulin as needed I recommend prednisone taper soon  Weight loss His weight loss has stabilized I recommend continue prednisone taper He will continue Remeron  Peripheral neuropathy due to chemotherapy [facility]) he has mild peripheral neuropathy, likely related to side effects of treatment and related to his diabetes It is only mild, not bothering the patient. I will observe for now If it gets worse in the future, I will consider modifying the dose of the treatment    No orders of the defined types were placed in this encounter.   INTERVAL HISTORY: Please see below for problem oriented charting. He returns with his wife for further follow-up He had some mild peripheral neuropathy affecting his feet It is not  described as painful He had  recurrent upper respiratory tract infection treated with antibiotics recently He  has lost some weight since prednisone taper but he felt that he is eating normal Denies recent nausea or vomiting  SUMMARY OF ONCOLOGIC HISTORY:   Tongue cancer ([place])   08/20/2015 Miscellaneous    He was evaluated by ENT      08/22/2015 Imaging    CT neck showed malignant adenopathy right supraclavicular region compatible with metastatic disease. Biopsy recommended. No definite pharyngeal mass lesion identified by CT.       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 No other primary or metastatic cancer. 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         11/13/2015 Procedure    Placement of 20 French pull-through percutaneous gastrostomy tube.      03/11/2016 PET scan    Resolution of hypermetabolic right cervical lymphadenopathy since prior study . No other hypermetabolic lymphadenopathy identified. New hypermetabolic lesion and caudate lobe of liver, highly suspicious for liver metastasis. Recommend liver protocol abdomen MRI without and with contrast for further evaluation. New patchy  airspace disease in both lung apices with FDG uptake. 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 Recommend continued attention on follow-up imaging.       03/19/2016 Imaging    MRI showed 3.4 cm heterogeneously enhancing mass in caudate lobe of liver, consistent liver  metastasis. No other sites of metastatic disease identified within the liver or abdomen      03/26/2016 Procedure    Ultrasound and fluoroscopically guided right internal jugular single lumen power port catheter insertion. Tip in the SVC/RA junction. 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 Metabolic response to therapy of caudate lobe liver metastasis. Low-level activity in this area is similar to the surrounding liver.  2. Hypermetabolism in the right inguinal crease without concurrent adenopathy. Especially given subtle surrounding edema, favor cellulitis. Consider physical exam correlation. 3. Lack of visualization of previously described 7 mm left lower lobe pulmonary nodule. 4. Diffuse marrow hypermetabolism, favoring stimulation by chemotherapy.      09/20/2016 PET scan    1. Reduced activity in the hypermetabolic caudate lobe lesion of the liver, likely a metastatic lesion. 2. Scattered likely inflammatory activity along the glenohumeral and acromioclavicular joints and in the hips. 3. Healing of bilateral upper rib fractures. 4. Other imaging findings of potential clinical significance: Aortic Atherosclerosis (ICD10-I70.0). Coronary and carotid atherosclerosis. Photopenic right renal cysts.      11/17/2016 Procedure    Successful removal of 20 French pull-through gastrostomy tube.      12/09/2016 PET scan    1. Slight increase in size and degree of uptake associated with caudate lobe lesion consistent with metastatic disease 2. Increase in size and FDG uptake  associated with portacaval lymph node 3. Aortic Atherosclerosis (ICD10-I70.0). Coronary atherosclerotic changes noted.      12/27/2016 -  Chemotherapy    He received carboplatin and taxol      01/19/2017 - 01/25/2017 Hospital Admission    He was admitted to the hospital with sepsis secondary to cholangitis, managed conservatively      01/21/2017 Imaging    1. Cholelithiasis with gallbladder distension and subtle pericholecystic edema. Findings suspicious for acute cholecystitis. Right upper quadrant ultrasound could confirm. 2. Similar caudate lobe metastasis. Similar borderline porta hepatis adenopathy. 3. Left nephrolithiasis. 4. New tiny right pleural effusion with bibasilar atelectasis. 5. Trace air in the nondependent urinary bladder. Correlate with instrumentation. 6. Coronary artery atherosclerosis. Aortic Atherosclerosis (ICD10-I70.0).        03/25/2017 PET scan    1. Reduced size and significantly reduced activity in the hepatic caudate lobe metastatic lesion. Significantly reduced activity of the adjacent porta hepatis lymph node. Appearance compatible with interval effective therapy. 2. Ground-glass and sub solid densities anteriorly in the right upper lobe appear to conform to several secondary pulmonary lobules and are new compared to the right our prior exam. Low-grade metabolic activity at 2.7. Probably alveolitis/low-grade pneumonia, the morphology would be atypical for metastatic disease, but surveillance is suggested. 3. There is  some focal accentuated metabolic activity at the sigmoid colon anastomotic site. No CT correlate. There is no abnormal activity in this vicinity on 12/09/2016 and I suspect that this is physiologic, but this area also warrant surveillance. 4. Other imaging findings of potential clinical significance: Aortic Atherosclerosis (ICD10-I70.0). Coronary atherosclerosis. Cholelithiasis potentially with minimal gallbladder wall thickening. Sigmoid colon  diverticulosis. Prostatomegaly.       Cancer of base of tongue (H[facility])    REVIEW OF SYSTEMS:   Constitutional: Denies fevers, chills  Eyes: Denies blurriness of vision Ears, nose, mouth, throat, and face: Denies mucositis or sore throat Respiratory: Denies cough, dyspnea or wheezes Cardiovascular: Denies palpitation, chest discomfort or lower extremity swelling Gastrointestinal:  Denies nausea, heartburn or change in bowel habits Skin: Denies abnormal skin rashes Lymphatics: Denies new lymphadenopathy or easy bruising Neurological:Denies numbness, tingling or new weaknesses Behavioral/Psych: Mood is stable, no new changes  All other systems were reviewed with the patient and are negative.  I have reviewed the past medical history, past surgical history, social history and family history with the patient and they are unchanged from previous note.  ALLERGIES:  is allergic to cetuximab.  MEDICATIONS:  Current Outpatient Medications  Medication Sig Dispense Refill  . clopidogrel (PLAVIX) 75 MG tablet TAKE 1 TABLET EVERY DAY 90 tablet 2  . insulin glargine (LANTUS) 100 UNIT/ML injection Inject 0.5 mLs (50 Units total) into the skin at bedtime. 10 mL   . mirtazapine (REMERON) 30 MG tablet Take 1 tablet (30 mg total) by mouth at bedtime. 90 tablet 11  . Multiple Vitamin (MULTIVITAMIN WITH MINERALS) TABS tablet Take 1 tablet by mouth daily. 30 tablet 0  . ondansetron (ZOFRAN) 8 MG tablet Take 1 tablet (8 mg total) by mouth every 8 (eight) hours as needed for nausea or vomiting. 60 tablet 11  . polyethylene glycol (MIRALAX / GLYCOLAX) packet Take 17 g by mouth daily as needed. 14 each 0  . predniSONE (DELTASONE) 10 MG tablet Take 1 tablet (10 mg total) by mouth daily with breakfast. 30 tablet 1   No current facility-administered medications for this visit.    Facility-Administered Medications Ordered in Other Visits  Medication Dose Route Frequency Provider Last Rate Last Dose  .  CARBOplatin (PARAPLATIN) 160 mg in sodium chloride 0.9 % 250 mL chemo infusion  160 mg Intravenous Once Alvy Bimler, Tykira Wachs, MD      . dexamethasone (DECADRON) 20 mg in sodium chloride 0.9 % 50 mL IVPB  20 mg Intravenous Once Alvy Bimler, Raechel Marcos, MD      . famotidine (PEPCID) 20 mg in sodium chloride 0.9 % 100 mL IVPB  20 mg Intravenous Once Alvy Bimler, Linus Weckerly, MD      . heparin lock flush 100 unit/mL  500 Units Intracatheter Once PRN Alvy Bimler, Sheddrick Lattanzio, MD      . PACLitaxel (TAXOL) 72 mg in sodium chloride 0.9 % 250 mL chemo infusion (</= 80mg /m2)  36 mg/m2 (Treatment Plan Recorded) Intravenous Once Anessa Charley, MD      . sodium chloride flush (NS) 0.9 % injection 10 mL  10 mL Intracatheter PRN Alvy Bimler, Jeanifer Halliday, MD        PHYSICAL EXAMINATION: ECOG PERFORMANCE STATUS: 1 - Symptomatic but completely ambulatory  Vitals:   04/11/17 0921  BP: 133/74  Pulse: 92  Resp: 18  Temp: 98.5 F (36.9 C)  SpO2: 100%   Filed Weights   04/11/17 0921  Weight: 159 lb 8 oz (72.3 kg)    GENERAL:alert, no distress and comfortable SKIN: skin color,  texture, turgor are normal, no rashes or significant lesions EYES: normal, Conjunctiva are pink and non-injected, sclera clear OROPHARYNX:no exudate, no erythema and lips, buccal mucosa, and tongue normal  NECK: supple, thyroid normal size, non-tender, without nodularity LYMPH:  no palpable lymphadenopathy in the cervical, axillary or inguinal LUNGS: clear to auscultation and percussion with normal breathing effort HEART: regular rate & rhythm and no murmurs and no lower extremity edema ABDOMEN:abdomen soft, non-tender and normal bowel sounds Musculoskeletal:no cyanosis of digits and no clubbing  NEURO: alert & oriented x 3 with fluent speech, no focal motor/sensory deficits  LABORATORY DATA:  I have reviewed the data as listed    Component Value Date/Time   NA 139 04/11/2017 0836   NA 140 02/07/2017 0811   K 3.8 04/11/2017 0836   K 3.9 02/07/2017 0811   CL 105 04/11/2017 0836   CO2  22 04/11/2017 0836   CO2 25 02/07/2017 0811   GLUCOSE 343 (H) 04/11/2017 0836   GLUCOSE 118 02/07/2017 0811   BUN 18 04/11/2017 0836   BUN 17.8 02/07/2017 0811   CREATININE 0.86 04/11/2017 0836   CREATININE 1.0 02/07/2017 0811   CALCIUM 9.3 04/11/2017 0836   CALCIUM 9.4 02/07/2017 0811   PROT 6.1 (L) 04/11/2017 0836   PROT 6.7 02/07/2017 0811   ALBUMIN 3.3 (L) 04/11/2017 0836   ALBUMIN 3.2 (L) 02/07/2017 0811   AST 11 04/11/2017 0836   AST 16 02/07/2017 0811   ALT 13 04/11/2017 0836   ALT 17 02/07/2017 0811   ALKPHOS 70 04/11/2017 0836   ALKPHOS 144 02/07/2017 0811   BILITOT 0.4 04/11/2017 0836   BILITOT 0.70 02/07/2017 0811   GFRNONAA >60 04/11/2017 0836   GFRAA >60 04/11/2017 0836    No results found for: SPEP, UPEP  Lab Results  Component Value Date   WBC 6.2 04/11/2017   NEUTROABS 5.4 04/11/2017   HGB 11.1 (L) 04/11/2017   HCT 33.8 (L) 04/11/2017   MCV 93.9 04/11/2017   PLT 157 04/11/2017      Chemistry      Component Value Date/Time   NA 139 04/11/2017 0836   NA 140 02/07/2017 0811   K 3.8 04/11/2017 0836   K 3.9 02/07/2017 0811   CL 105 04/11/2017 0836   CO2 22 04/11/2017 0836   CO2 25 02/07/2017 0811   BUN 18 04/11/2017 0836   BUN 17.8 02/07/2017 0811   CREATININE 0.86 04/11/2017 0836   CREATININE 1.0 02/07/2017 0811   GLU 78 11/13/2015 1134      Component Value Date/Time   CALCIUM 9.3 04/11/2017 0836   CALCIUM 9.4 02/07/2017 0811   ALKPHOS 70 04/11/2017 0836   ALKPHOS 144 02/07/2017 0811   AST 11 04/11/2017 0836   AST 16 02/07/2017 0811   ALT 13 04/11/2017 0836   ALT 17 02/07/2017 0811   BILITOT 0.4 04/11/2017 0836   BILITOT 0.70 02/07/2017 0811       RADIOGRAPHIC STUDIES: I have personally reviewed the radiological images as listed and agreed with the findings in the report. Nm Pet Image Restag (ps) Skull Base To Thigh  Result Date: 03/25/2017 CLINICAL DATA:  Subsequent treatment strategy for head and neck cancer (tongue). EXAM:  NUCLEAR MEDICINE PET SKULL BASE TO THIGH TECHNIQUE: 8.1 mCi F-18 FDG was injected intravenously. Full-ring PET imaging was performed from the skull base to thigh after the radiotracer. CT data was obtained and used for attenuation correction and anatomic localization. FASTING BLOOD GLUCOSE:  Value: 141 mg/dl COMPARISON:  Multiple exams, including 12/09/2016 FINDINGS: NECK No hypermetabolic lymph nodes in the neck. No significant abnormal accentuated metabolic activity along the tongue. Bilateral carotid atherosclerotic calcification. CHEST No hypermetabolic mediastinal or hilar nodes. Patchy ground-glass opacities anteriorly in the right upper lobe adjacent to the right first rib on images 9 through 15 of series 8 appear increased from 12/09/2016. A sub solid component measures 1.4 by 1.1 cm on image 10/8 and appears to conform to a secondary pulmonary lobule. A slightly more dense lesion measures 2.0 by 1.1 cm on image 13/8. Mildly associated accentuated metabolic activity, maximum SUV 2.7. Coronary, aortic arch, and branch vessel atherosclerotic vascular disease. Right Port-A-Cath tip: Cavoatrial junction. ABDOMEN/PELVIS Significant improvement in the caudate lobe metastatic lesion, associated hypodensity 3.0 by 1.7 cm (formerly 3.2 by 2.3 cm) with maximum SUV 4.1 (formerly 11.9). An adjacent porta hepatis node measures 1.4 cm in short axis (previously 1.3 cm) with maximum SUV 3.2 (formerly 5.7). Accentuated activity in the vicinity of a sigmoid proximal sigmoid colon anastomotic site, maximum SUV 6.2 and formerly 4.0 in this vicinity. Cholelithiasis. Potential mild wall thickening in the gallbladder. Photopenic right kidney upper pole cyst. Aortoiliac atherosclerotic vascular disease. Sigmoid colon diverticulosis. Prostatomegaly. SKELETON No focal hypermetabolic activity to suggest skeletal metastasis. IMPRESSION: 1. Reduced size and significantly reduced activity in the hepatic caudate lobe metastatic lesion.  Significantly reduced activity of the adjacent porta hepatis lymph node. Appearance compatible with interval effective therapy. 2. Ground-glass and sub solid densities anteriorly in the right upper lobe appear to conform to several secondary pulmonary lobules and are new compared to the right our prior exam. Low-grade metabolic activity at 2.7. Probably alveolitis/low-grade pneumonia, the morphology would be atypical for metastatic disease, but surveillance is suggested. 3. There is some focal accentuated metabolic activity at the sigmoid colon anastomotic site. No CT correlate. There is no abnormal activity in this vicinity on 12/09/2016 and I suspect that this is physiologic, but this area also warrant surveillance. 4. Other imaging findings of potential clinical significance: Aortic Atherosclerosis (ICD10-I70.0). Coronary atherosclerosis. Cholelithiasis potentially with minimal gallbladder wall thickening. Sigmoid colon diverticulosis. Prostatomegaly. Electronically Signed   By: Van Clines M.D.   On: 03/25/2017 10:26    All questions were answered. The patient knows to call the clinic with any problems, questions or concerns. No barriers to learning was detected.  I spent 15 minutes counseling the patient face to face. The total time spent in the appointment was 20 minutes and more than 50% was on counseling and review of test results  Heath Lark, MD 04/11/2017 10:28 AM

## 2017-04-11 NOTE — Patient Instructions (Signed)
Implanted Port Home Guide An implanted port is a type of central line that is placed under the skin. Central lines are used to provide IV access when treatment or nutrition needs to be given through a person's veins. Implanted ports are used for long-term IV access. An implanted port may be placed because:  You need IV medicine that would be irritating to the small veins in your hands or arms.  You need long-term IV medicines, such as antibiotics.  You need IV nutrition for a long period.  You need frequent blood draws for lab tests.  You need dialysis.  Implanted ports are usually placed in the chest area, but they can also be placed in the upper arm, the abdomen, or the leg. An implanted port has two main parts:  Reservoir. The reservoir is round and will appear as a small, raised area under your skin. The reservoir is the part where a needle is inserted to give medicines or draw blood.  Catheter. The catheter is a thin, flexible tube that extends from the reservoir. The catheter is placed into a large vein. Medicine that is inserted into the reservoir goes into the catheter and then into the vein.  How will I care for my incision site? Do not get the incision site wet. Bathe or shower as directed by your health care provider. How is my port accessed? Special steps must be taken to access the port:  Before the port is accessed, a numbing cream can be placed on the skin. This helps numb the skin over the port site.  Your health care provider uses a sterile technique to access the port. ? Your health care provider must put on a mask and sterile gloves. ? The skin over your port is cleaned carefully with an antiseptic and allowed to dry. ? The port is gently pinched between sterile gloves, and a needle is inserted into the port.  Only "non-coring" port needles should be used to access the port. Once the port is accessed, a blood return should be checked. This helps ensure that the port  is in the vein and is not clogged.  If your port needs to remain accessed for a constant infusion, a clear (transparent) bandage will be placed over the needle site. The bandage and needle will need to be changed every week, or as directed by your health care provider.  Keep the bandage covering the needle clean and dry. Do not get it wet. Follow your health care provider's instructions on how to take a shower or bath while the port is accessed.  If your port does not need to stay accessed, no bandage is needed over the port.  What is flushing? Flushing helps keep the port from getting clogged. Follow your health care provider's instructions on how and when to flush the port. Ports are usually flushed with saline solution or a medicine called heparin. The need for flushing will depend on how the port is used.  If the port is used for intermittent medicines or blood draws, the port will need to be flushed: ? After medicines have been given. ? After blood has been drawn. ? As part of routine maintenance.  If a constant infusion is running, the port may not need to be flushed.  How long will my port stay implanted? The port can stay in for as long as your health care provider thinks it is needed. When it is time for the port to come out, surgery will be   done to remove it. The procedure is similar to the one performed when the port was put in. When should I seek immediate medical care? When you have an implanted port, you should seek immediate medical care if:  You notice a bad smell coming from the incision site.  You have swelling, redness, or drainage at the incision site.  You have more swelling or pain at the port site or the surrounding area.  You have a fever that is not controlled with medicine.  This information is not intended to replace advice given to you by your health care provider. Make sure you discuss any questions you have with your health care provider. Document  Released: 01/25/2005 Document Revised: 07/03/2015 Document Reviewed: 10/02/2012 Elsevier Interactive Patient Education  2017 [employer]  

## 2017-04-13 ENCOUNTER — Inpatient Hospital Stay: Payer: Medicare Other

## 2017-04-13 VITALS — BP 135/56 | HR 90 | Temp 97.9°F | Resp 20

## 2017-04-13 DIAGNOSIS — Z5111 Encounter for antineoplastic chemotherapy: Secondary | ICD-10-CM | POA: Diagnosis not present

## 2017-04-13 DIAGNOSIS — R110 Nausea: Secondary | ICD-10-CM

## 2017-04-13 DIAGNOSIS — C01 Malignant neoplasm of base of tongue: Secondary | ICD-10-CM

## 2017-04-13 MED ORDER — PEGFILGRASTIM-CBQV 6 MG/0.6ML ~~LOC~~ SOSY
6.0000 mg | PREFILLED_SYRINGE | Freq: Once | SUBCUTANEOUS | Status: AC
  Administered 2017-04-13: 6 mg via SUBCUTANEOUS

## 2017-04-13 MED ORDER — PEGFILGRASTIM INJECTION 6 MG/0.6ML ~~LOC~~
PREFILLED_SYRINGE | SUBCUTANEOUS | Status: AC
Start: 2017-04-13 — End: 2017-04-13
  Filled 2017-04-13: qty 0.6

## 2017-04-13 MED ORDER — PEGFILGRASTIM-CBQV 6 MG/0.6ML ~~LOC~~ SOSY
PREFILLED_SYRINGE | SUBCUTANEOUS | Status: AC
  Filled 2017-04-13: qty 0.6

## 2017-04-13 NOTE — Patient Instructions (Signed)
Pegfilgrastim injection What is this medicine? PEGFILGRASTIM (PEG fil gra stim) is a long-acting granulocyte colony-stimulating factor that stimulates the growth of neutrophils, a type of white blood cell important in the body's fight against infection. It is used to reduce the incidence of fever and infection in patients with certain types of cancer who are receiving chemotherapy that affects the bone marrow, and to increase survival after being exposed to high doses of radiation. This medicine may be used for other purposes; ask your health care provider or pharmacist if you have questions. COMMON BRAND NAME(S): Neulasta What should I tell my health care provider before I take this medicine? They need to know if you have any of these conditions: -kidney disease -latex allergy -ongoing radiation therapy -sickle cell disease -skin reactions to acrylic adhesives (On-Body Injector only) -an unusual or allergic reaction to pegfilgrastim, filgrastim, other medicines, foods, dyes, or preservatives -pregnant or trying to get pregnant -breast-feeding How should I use this medicine? This medicine is for injection under the skin. If you get this medicine at home, you will be taught how to prepare and give the pre-filled syringe or how to use the On-body Injector. Refer to the patient Instructions for Use for detailed instructions. Use exactly as directed. Tell your healthcare provider immediately if you suspect that the On-body Injector may not have performed as intended or if you suspect the use of the On-body Injector resulted in a missed or partial dose. It is important that you put your used needles and syringes in a special sharps container. Do not put them in a trash can. If you do not have a sharps container, call your pharmacist or healthcare provider to get one. Talk to your pediatrician regarding the use of this medicine in children. While this drug may be prescribed for selected conditions,  precautions do apply. Overdosage: If you think you have taken too much of this medicine contact a poison control center or emergency room at once. NOTE: This medicine is only for you. Do not share this medicine with others. What if I miss a dose? It is important not to miss your dose. Call your doctor or health care professional if you miss your dose. If you miss a dose due to an On-body Injector failure or leakage, a new dose should be administered as soon as possible using a single prefilled syringe for manual use. What may interact with this medicine? Interactions have not been studied. Give your health care provider a list of all the medicines, herbs, non-prescription drugs, or dietary supplements you use. Also tell them if you smoke, drink alcohol, or use illegal drugs. Some items may interact with your medicine. This list may not describe all possible interactions. Give your health care provider a list of all the medicines, herbs, non-prescription drugs, or dietary supplements you use. Also tell them if you smoke, drink alcohol, or use illegal drugs. Some items may interact with your medicine. What should I watch for while using this medicine? You may need blood work done while you are taking this medicine. If you are going to need a MRI, CT scan, or other procedure, tell your doctor that you are using this medicine (On-Body Injector only). What side effects may I notice from receiving this medicine? Side effects that you should report to your doctor or health care professional as soon as possible: -allergic reactions like skin rash, itching or hives, swelling of the face, lips, or tongue -dizziness -fever -pain, redness, or irritation at site   where injected -pinpoint red spots on the skin -red or dark-brown urine -shortness of breath or breathing problems -stomach or side pain, or pain at the shoulder -swelling -tiredness -trouble passing urine or change in the amount of urine Side  effects that usually do not require medical attention (report to your doctor or health care professional if they continue or are bothersome): -bone pain -muscle pain This list may not describe all possible side effects. Call your doctor for medical advice about side effects. You may report side effects to FDA at 1-800-FDA-1088. Where should I keep my medicine? Keep out of the reach of children. Store pre-filled syringes in a refrigerator between 2 and 8 degrees C (36 and 46 degrees F). Do not freeze. Keep in carton to protect from light. Throw away this medicine if it is left out of the refrigerator for more than 48 hours. Throw away any unused medicine after the expiration date. NOTE: This sheet is a summary. It may not cover all possible information. If you have questions about this medicine, talk to your doctor, pharmacist, or health care provider.  2018 Elsevier/Gold Standard (2016-01-22 12:58:03)  

## 2017-04-25 ENCOUNTER — Inpatient Hospital Stay: Payer: Medicare Other

## 2017-04-25 ENCOUNTER — Inpatient Hospital Stay (HOSPITAL_BASED_OUTPATIENT_CLINIC_OR_DEPARTMENT_OTHER): Payer: Medicare Other | Admitting: Hematology and Oncology

## 2017-04-25 ENCOUNTER — Encounter: Payer: Self-pay | Admitting: Hematology and Oncology

## 2017-04-25 ENCOUNTER — Telehealth: Payer: Self-pay | Admitting: Hematology and Oncology

## 2017-04-25 DIAGNOSIS — Z794 Long term (current) use of insulin: Secondary | ICD-10-CM | POA: Diagnosis not present

## 2017-04-25 DIAGNOSIS — C029 Malignant neoplasm of tongue, unspecified: Secondary | ICD-10-CM | POA: Diagnosis not present

## 2017-04-25 DIAGNOSIS — R110 Nausea: Secondary | ICD-10-CM

## 2017-04-25 DIAGNOSIS — D61818 Other pancytopenia: Secondary | ICD-10-CM

## 2017-04-25 DIAGNOSIS — C01 Malignant neoplasm of base of tongue: Secondary | ICD-10-CM

## 2017-04-25 DIAGNOSIS — E1140 Type 2 diabetes mellitus with diabetic neuropathy, unspecified: Secondary | ICD-10-CM | POA: Diagnosis not present

## 2017-04-25 DIAGNOSIS — C787 Secondary malignant neoplasm of liver and intrahepatic bile duct: Secondary | ICD-10-CM

## 2017-04-25 DIAGNOSIS — C770 Secondary and unspecified malignant neoplasm of lymph nodes of head, face and neck: Secondary | ICD-10-CM

## 2017-04-25 DIAGNOSIS — Z5111 Encounter for antineoplastic chemotherapy: Secondary | ICD-10-CM | POA: Diagnosis not present

## 2017-04-25 LAB — COMPREHENSIVE METABOLIC PANEL
ALT: 17 U/L (ref 0–55)
AST: 14 U/L (ref 5–34)
Albumin: 3.2 g/dL — ABNORMAL LOW (ref 3.5–5.0)
Alkaline Phosphatase: 137 U/L (ref 40–150)
Anion gap: 10 (ref 3–11)
BUN: 13 mg/dL (ref 7–26)
CHLORIDE: 106 mmol/L (ref 98–109)
CO2: 24 mmol/L (ref 22–29)
Calcium: 8.8 mg/dL (ref 8.4–10.4)
Creatinine, Ser: 0.89 mg/dL (ref 0.70–1.30)
Glucose, Bld: 328 mg/dL — ABNORMAL HIGH (ref 70–140)
POTASSIUM: 3.2 mmol/L — AB (ref 3.5–5.1)
Sodium: 140 mmol/L (ref 136–145)
Total Bilirubin: 0.4 mg/dL (ref 0.2–1.2)
Total Protein: 5.9 g/dL — ABNORMAL LOW (ref 6.4–8.3)

## 2017-04-25 LAB — CBC WITH DIFFERENTIAL/PLATELET
BASOS ABS: 0 10*3/uL (ref 0.0–0.1)
Basophils Relative: 0 %
EOS ABS: 0 10*3/uL (ref 0.0–0.5)
EOS PCT: 0 %
HCT: 32.7 % — ABNORMAL LOW (ref 38.4–49.9)
Hemoglobin: 10.8 g/dL — ABNORMAL LOW (ref 13.0–17.1)
LYMPHS PCT: 5 %
Lymphs Abs: 0.6 10*3/uL — ABNORMAL LOW (ref 0.9–3.3)
MCH: 31.8 pg (ref 27.2–33.4)
MCHC: 33 g/dL (ref 32.0–36.0)
MCV: 96.2 fL (ref 79.3–98.0)
MONO ABS: 0.5 10*3/uL (ref 0.1–0.9)
Monocytes Relative: 4 %
Neutro Abs: 10.8 10*3/uL — ABNORMAL HIGH (ref 1.5–6.5)
Neutrophils Relative %: 91 %
PLATELETS: 95 10*3/uL — AB (ref 140–400)
RBC: 3.4 MIL/uL — ABNORMAL LOW (ref 4.20–5.82)
RDW: 17.2 % — AB (ref 11.0–14.6)
WBC: 11.9 10*3/uL — ABNORMAL HIGH (ref 4.0–10.3)

## 2017-04-25 MED ORDER — DEXAMETHASONE SODIUM PHOSPHATE 100 MG/10ML IJ SOLN
20.0000 mg | Freq: Once | INTRAMUSCULAR | Status: AC
  Administered 2017-04-25: 20 mg via INTRAVENOUS
  Filled 2017-04-25: qty 2

## 2017-04-25 MED ORDER — SODIUM CHLORIDE 0.9% FLUSH
10.0000 mL | INTRAVENOUS | Status: DC | PRN
Start: 2017-04-25 — End: 2017-04-25
  Administered 2017-04-25: 10 mL
  Filled 2017-04-25: qty 10

## 2017-04-25 MED ORDER — SODIUM CHLORIDE 0.9 % IV SOLN
20.0000 mg | Freq: Once | INTRAVENOUS | Status: AC
  Administered 2017-04-25: 20 mg via INTRAVENOUS
  Filled 2017-04-25: qty 2

## 2017-04-25 MED ORDER — DIPHENHYDRAMINE HCL 50 MG/ML IJ SOLN
INTRAMUSCULAR | Status: AC
  Filled 2017-04-25: qty 1

## 2017-04-25 MED ORDER — DEXAMETHASONE SODIUM PHOSPHATE 10 MG/ML IJ SOLN
INTRAMUSCULAR | Status: AC
  Filled 2017-04-25: qty 1

## 2017-04-25 MED ORDER — SODIUM CHLORIDE 0.9 % IV SOLN
36.0000 mg/m2 | Freq: Once | INTRAVENOUS | Status: AC
  Administered 2017-04-25: 72 mg via INTRAVENOUS
  Filled 2017-04-25: qty 12

## 2017-04-25 MED ORDER — DIPHENHYDRAMINE HCL 50 MG/ML IJ SOLN
50.0000 mg | Freq: Once | INTRAMUSCULAR | Status: AC
  Administered 2017-04-25: 50 mg via INTRAVENOUS

## 2017-04-25 MED ORDER — SODIUM CHLORIDE 0.9 % IV SOLN
159.0400 mg | Freq: Once | INTRAVENOUS | Status: AC
  Administered 2017-04-25: 160 mg via INTRAVENOUS
  Filled 2017-04-25: qty 16

## 2017-04-25 MED ORDER — FAMOTIDINE IN NACL 20-0.9 MG/50ML-% IV SOLN: 20.0000 mg | Freq: Once | INTRAVENOUS | Status: DC

## 2017-04-25 MED ORDER — HEPARIN SOD (PORK) LOCK FLUSH 100 UNIT/ML IV SOLN
500.0000 [IU] | Freq: Once | INTRAVENOUS | Status: AC | PRN
  Administered 2017-04-25: 500 [IU]
  Filled 2017-04-25: qty 5

## 2017-04-25 MED ORDER — SODIUM CHLORIDE 0.9 % IJ SOLN
10.0000 mL | INTRAMUSCULAR | Status: DC | PRN
  Administered 2017-04-25: 10 mL
  Filled 2017-04-25: qty 10

## 2017-04-25 MED ORDER — SODIUM CHLORIDE 0.9 % IV SOLN
Freq: Once | INTRAVENOUS | Status: AC
  Administered 2017-04-25: 10:00:00 via INTRAVENOUS

## 2017-04-25 MED ORDER — PALONOSETRON HCL INJECTION 0.25 MG/5ML
INTRAVENOUS | Status: AC
  Filled 2017-04-25: qty 5

## 2017-04-25 MED ORDER — PALONOSETRON HCL INJECTION 0.25 MG/5ML
0.2500 mg | Freq: Once | INTRAVENOUS | Status: AC
  Administered 2017-04-25: 0.25 mg via INTRAVENOUS

## 2017-04-25 NOTE — Assessment & Plan Note (Signed)
Recent PET CT scan showed positive response to treatment Unfortunately, he has significant pancytopenia We discussed different strategies including dose reduction, changing chemo schedule and add Neulasta Unfortunately, despite all the above, he is still pancytopenic.  He is not symptomatic from pancytopenia With his permission, we would proceed with chemotherapy as scheduled I would cancel his treatment appointment next week as it is unlikely he will be able to proceed with treatment next week I plan to modify his chemotherapy to every other week.  If his white count look good, we might be able to omit Neulasta

## 2017-04-25 NOTE — Progress Notes (Signed)
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  He is not symptomatic from pancytopenia With his permission, we would proceed with chemotherapy as scheduled I would cancel his treatment appointment next week as it is unlikely he will be able to proceed with treatment next week I plan to modify his chemotherapy to every other week.  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   Depending on his blood count in 2 weeks, he may or may not need to continue on G-CSF support  Diabetes mellitus with diabetic neuropathy, with long-term current use of insulin ([place]) The patient is regulating his insulin based on his blood sugar. He is tolerating low-dose prednisone well. He denies progression of peripheral neuropathy.  We will continue to monitor his blood sugar  carefully   No orders of the defined types were placed in this encounter.   INTERVAL HISTORY: Please see below for problem oriented charting. He returns with his wife for further follow-up and chemotherapy He denies recent infection, fever or cough Denies worsening peripheral neuropathy He is able to slowly gain some weight with the help of prednisone and mirtazapine He denies abdominal pain, nausea or changes in bowel habits  SUMMARY OF ONCOLOGIC HISTORY:   Tongue cancer ([place])   08/20/2015 Miscellaneous    He was evaluated by ENT      08/22/2015 Imaging    CT neck showed malignant adenopathy right supraclavicular region compatible with metastatic disease. Biopsy recommended. No definite pharyngeal mass lesion identified by CT.       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 No other primary or metastatic cancer. 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         11/13/2015 Procedure    Placement of 20 French pull-through percutaneous gastrostomy tube.      03/11/2016 PET scan    Resolution of hypermetabolic right cervical lymphadenopathy since  prior study . No other hypermetabolic lymphadenopathy identified. New hypermetabolic lesion and caudate lobe of liver, highly suspicious for liver metastasis. Recommend liver protocol abdomen MRI without and with contrast for further evaluation. New patchy airspace disease in both lung apices with FDG uptake. This likely due to radiation pneumonitis, although other  infectious or inflammatory etiologies cannot definitely be excluded. Recommend continued attention on follow-up imaging.       03/19/2016 Imaging    MRI showed 3.4 cm heterogeneously enhancing mass in caudate lobe of liver, consistent liver metastasis. No other sites of metastatic disease identified within the liver or abdomen      03/26/2016 Procedure    Ultrasound and fluoroscopically guided right internal jugular single lumen power port catheter insertion. Tip in the SVC/RA junction. 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 Metabolic response to therapy of caudate lobe liver metastasis. Low-level activity in this area is similar to the surrounding liver.  2. Hypermetabolism in the right inguinal crease without concurrent adenopathy. Especially given subtle surrounding edema, favor cellulitis. Consider physical exam correlation. 3. Lack of visualization of previously described 7 mm left lower lobe pulmonary nodule. 4. Diffuse marrow hypermetabolism, favoring stimulation by chemotherapy.      09/20/2016 PET scan    1. Reduced activity in the hypermetabolic caudate lobe lesion of the liver, likely a metastatic lesion. 2. Scattered likely inflammatory activity along the glenohumeral and acromioclavicular joints and in the hips. 3. Healing of bilateral upper rib fractures. 4. Other imaging findings of potential clinical significance: Aortic Atherosclerosis (ICD10-I70.0). Coronary and carotid atherosclerosis. Photopenic right renal cysts.      11/17/2016 Procedure     Successful removal of 20 French pull-through gastrostomy tube.      12/09/2016 PET scan    1. Slight increase in size and degree of uptake associated with caudate lobe lesion consistent with metastatic disease 2. Increase in size and FDG uptake associated with portacaval lymph node 3. Aortic Atherosclerosis (ICD10-I70.0). Coronary atherosclerotic changes noted.      12/27/2016 -  Chemotherapy    He received carboplatin and taxol      01/19/2017 - 01/25/2017 Hospital Admission    He was admitted to the hospital with sepsis secondary to cholangitis, managed conservatively      01/21/2017 Imaging    1. Cholelithiasis with gallbladder distension and subtle pericholecystic edema. Findings suspicious for acute cholecystitis. Right upper quadrant ultrasound could confirm. 2. Similar caudate lobe metastasis. Similar borderline porta hepatis adenopathy. 3. Left nephrolithiasis. 4. New tiny right pleural effusion with bibasilar atelectasis. 5. Trace air in the nondependent urinary bladder. Correlate with instrumentation. 6. Coronary artery atherosclerosis. Aortic Atherosclerosis (ICD10-I70.0).        03/25/2017 PET scan    1. Reduced size and significantly reduced activity in the hepatic caudate lobe metastatic lesion. Significantly reduced activity of the adjacent porta hepatis lymph node. Appearance compatible with interval effective therapy. 2. Ground-glass and sub solid densities anteriorly in the right upper lobe appear to conform to several secondary  pulmonary lobules and are new compared to the right our prior exam. Low-grade metabolic activity at 2.7. Probably alveolitis/low-grade pneumonia, the morphology would be atypical for metastatic disease, but surveillance is suggested. 3. There is some focal accentuated metabolic activity at the sigmoid colon anastomotic site. No CT correlate. There is no abnormal activity in this vicinity on 12/09/2016 and I suspect that this is physiologic,  but this area also warrant surveillance. 4. Other imaging findings of potential clinical significance: Aortic Atherosclerosis (ICD10-I70.0). Coronary atherosclerosis. Cholelithiasis potentially with minimal gallbladder wall thickening. Sigmoid colon diverticulosis. Prostatomegaly.       Cancer of base of tongue (HCC)    REVIEW OF SYSTEMS:   Constitutional: Denies fevers, chills or abnormal weight loss Eyes: Denies blurriness of vision Ears, nose, mouth, throat, and face: Denies mucositis or sore throat Respiratory: Denies cough, dyspnea or wheezes Cardiovascular: Denies palpitation, chest discomfort or lower extremity swelling Gastrointestinal:  Denies nausea, heartburn or change in bowel habits Skin: Denies abnormal skin rashes Lymphatics: Denies new lymphadenopathy or easy bruising Neurological:Denies numbness, tingling or new weaknesses Behavioral/Psych: Mood is stable, no new changes  All other systems were reviewed with the patient and are negative.  I have reviewed the past medical history, past surgical history, social history and family history with the patient and they are unchanged from previous note.  ALLERGIES:  is allergic to cetuximab.  MEDICATIONS:  Current Outpatient Medications  Medication Sig Dispense Refill  . clopidogrel (PLAVIX) 75 MG tablet TAKE 1 TABLET EVERY DAY 90 tablet 2  . insulin glargine (LANTUS) 100 UNIT/ML injection Inject 0.5 mLs (50 Units total) into the skin at bedtime. 10 mL   . mirtazapine (REMERON) 30 MG tablet Take 1 tablet (30 mg total) by mouth at bedtime. 90 tablet 11  . Multiple Vitamin (MULTIVITAMIN WITH MINERALS) TABS tablet Take 1 tablet by mouth daily. 30 tablet 0  . ondansetron (ZOFRAN) 8 MG tablet Take 1 tablet (8 mg total) by mouth every 8 (eight) hours as needed for nausea or vomiting. 60 tablet 11  . polyethylene glycol (MIRALAX / GLYCOLAX) packet Take 17 g by mouth daily as needed. 14 each 0  . predniSONE (DELTASONE) 10 MG tablet  Take 1 tablet (10 mg total) by mouth daily with breakfast. 30 tablet 1   No current facility-administered medications for this visit.     PHYSICAL EXAMINATION: ECOG PERFORMANCE STATUS: 1 - Symptomatic but completely ambulatory  Vitals:   04/25/17 0809  BP: 113/60  Pulse: 92  Resp: 18  Temp: 98.6 F (37 C)  SpO2: 100%   Filed Weights   04/25/17 0809  Weight: 164 lb 6.4 oz (74.6 kg)    GENERAL:alert, no distress and comfortable SKIN: skin color, texture, turgor are normal, no rashes or significant lesions EYES: normal, Conjunctiva are pink and non-injected, sclera clear OROPHARYNX:no exudate, no erythema and lips, buccal mucosa, and tongue normal  NECK: supple, thyroid normal size, non-tender, without nodularity LYMPH:  no palpable lymphadenopathy in the cervical, axillary or inguinal LUNGS: clear to auscultation and percussion with normal breathing effort HEART: regular rate & rhythm and no murmurs and no lower extremity edema ABDOMEN:abdomen soft, non-tender and normal bowel sounds Musculoskeletal:no cyanosis of digits and no clubbing  NEURO: alert & oriented x 3 with fluent speech, no focal motor/sensory deficits  LABORATORY DATA:  I have reviewed the data as listed    Component Value Date/Time   NA 140 04/25/2017 0741   NA 140 02/07/2017 0811   K 3.2 (L)  04/25/2017 0741   K 3.9 02/07/2017 0811   CL 106 04/25/2017 0741   CO2 24 04/25/2017 0741   CO2 25 02/07/2017 0811   GLUCOSE 328 (H) 04/25/2017 0741   GLUCOSE 118 02/07/2017 0811   BUN 13 04/25/2017 0741   BUN 17.8 02/07/2017 0811   CREATININE 0.89 04/25/2017 0741   CREATININE 1.0 02/07/2017 0811   CALCIUM 8.8 04/25/2017 0741   CALCIUM 9.4 02/07/2017 0811   PROT 5.9 (L) 04/25/2017 0741   PROT 6.7 02/07/2017 0811   ALBUMIN 3.2 (L) 04/25/2017 0741   ALBUMIN 3.2 (L) 02/07/2017 0811   AST 14 04/25/2017 0741   AST 16 02/07/2017 0811   ALT 17 04/25/2017 0741   ALT 17 02/07/2017 0811   ALKPHOS 137 04/25/2017  0741   ALKPHOS 144 02/07/2017 0811   BILITOT 0.4 04/25/2017 0741   BILITOT 0.70 02/07/2017 0811   GFRNONAA >60 04/25/2017 0741   GFRAA >60 04/25/2017 0741    No results found for: SPEP, UPEP  Lab Results  Component Value Date   WBC 11.9 (H) 04/25/2017   NEUTROABS 10.8 (H) 04/25/2017   HGB 10.8 (L) 04/25/2017   HCT 32.7 (L) 04/25/2017   MCV 96.2 04/25/2017   PLT 95 (L) 04/25/2017      Chemistry      Component Value Date/Time   NA 140 04/25/2017 0741   NA 140 02/07/2017 0811   K 3.2 (L) 04/25/2017 0741   K 3.9 02/07/2017 0811   CL 106 04/25/2017 0741   CO2 24 04/25/2017 0741   CO2 25 02/07/2017 0811   BUN 13 04/25/2017 0741   BUN 17.8 02/07/2017 0811   CREATININE 0.89 04/25/2017 0741   CREATININE 1.0 02/07/2017 0811   GLU 78 11/13/2015 1134      Component Value Date/Time   CALCIUM 8.8 04/25/2017 0741   CALCIUM 9.4 02/07/2017 0811   ALKPHOS 137 04/25/2017 0741   ALKPHOS 144 02/07/2017 0811   AST 14 04/25/2017 0741   AST 16 02/07/2017 0811   ALT 17 04/25/2017 0741   ALT 17 02/07/2017 0811   BILITOT 0.4 04/25/2017 0741   BILITOT 0.70 02/07/2017 0811      All questions were answered. The patient knows to call the clinic with any problems, questions or concerns. No barriers to learning was detected.  I spent 20 minutes counseling the patient face to face. The total time spent in the appointment was 25 minutes and more than 50% was on counseling and review of test results  Heath Lark, MD 04/25/2017 9:05 AM

## 2017-04-25 NOTE — Assessment & Plan Note (Signed)
The patient is regulating his insulin based on his blood sugar. He is tolerating low-dose prednisone well. He denies progression of peripheral neuropathy.  We will continue to monitor his blood sugar carefully

## 2017-04-25 NOTE — Assessment & Plan Note (Signed)
The liver masses are shrinking We will continue treatment indefinitely His liver function tests are within normal limits

## 2017-04-25 NOTE — Telephone Encounter (Signed)
Gave avs and calendar for april °

## 2017-04-25 NOTE — Patient Instructions (Addendum)
[facility] Discharge Instructions for Patients Receiving Chemotherapy  Today you received the following chemotherapy agents: Paclitaxel (Taxol) and Carboplatin (Paraplatin).  To help prevent nausea and vomiting after your treatment, we encourage you to take your nausea medication as prescribed. Received Aloxi during treatment-->take Compazine (not Zofran) for the next 3 days.  If you develop nausea and vomiting that is not controlled by your nausea medication, call the clinic.   BELOW ARE SYMPTOMS THAT SHOULD BE REPORTED IMMEDIATELY:  *FEVER GREATER THAN 100.5 F  *CHILLS WITH OR WITHOUT FEVER  NAUSEA AND VOMITING THAT IS NOT CONTROLLED WITH YOUR NAUSEA MEDICATION  *UNUSUAL SHORTNESS OF BREATH  *UNUSUAL BRUISING OR BLEEDING  TENDERNESS IN MOUTH AND THROAT WITH OR WITHOUT PRESENCE OF ULCERS  *URINARY PROBLEMS  *BOWEL PROBLEMS  UNUSUAL RASH Items with * indicate a potential emergency and should be followed up as soon as possible.  Feel free to call the clinic should you have any questions or concerns. The clinic phone number is [phone].  Please show the [place] at check-in to the Emergency Department and triage nurse.   Potassium Content of Foods Potassium is a mineral found in many foods and drinks. It helps keep fluids and minerals balanced in your body and affects how steadily your heart beats. Potassium also helps control your blood pressure and keep your muscles and nervous system healthy. Certain health conditions and medicines may change the balance of potassium in your body. When this happens, you can help balance your level of potassium through the foods that you do or do not eat. Your health care provider or dietitian may recommend an amount of potassium that you should have each day. The following lists of foods provide the amount of potassium (in parentheses) per serving in each item. High in potassium The following foods and beverages  have 200 mg or more of potassium per serving:  Apricots, 2 raw or 5 dry (200 mg).  Artichoke, 1 medium (345 mg).  Avocado, raw,  each (245 mg).  Banana, 1 medium (425 mg).  Beans, lima, or baked beans, canned,  cup (280 mg).  Beans, white, canned,  cup (595 mg).  Beef roast, 3 oz (320 mg).  Beef, ground, 3 oz (270 mg).  Beets, raw or cooked,  cup (260 mg).  Bran muffin, 2 oz (300 mg).  Broccoli,  cup (230 mg).  Brussels sprouts,  cup (250 mg).  Cantaloupe,  cup (215 mg).  Cereal, 100% bran,  cup (200-400 mg).  Cheeseburger, single, fast food, 1 each (225-400 mg).  Chicken, 3 oz (220 mg).  Clams, canned, 3 oz (535 mg).  Crab, 3 oz (225 mg).  Dates, 5 each (270 mg).  Dried beans and peas,  cup (300-475 mg).  Figs, dried, 2 each (260 mg).  Fish: halibut, tuna, cod, snapper, 3 oz (480 mg).  Fish: salmon, haddock, swordfish, perch, 3 oz (300 mg).  Fish, tuna, canned 3 oz (200 mg).  [place] fries, fast food, 3 oz (470 mg).  Granola with fruit and nuts,  cup (200 mg).  Grapefruit juice,  cup (200 mg).  Greens, beet,  cup (655 mg).  Honeydew melon,  cup (200 mg).  Kale, raw, 1 cup (300 mg).  Kiwi, 1 medium (240 mg).  Kohlrabi, rutabaga, parsnips,  cup (280 mg).  Lentils,  cup (365 mg).  Mango, 1 each (325 mg).  Milk, chocolate, 1 cup (420 mg).  Milk: nonfat, low-fat, whole, buttermilk, 1 cup (350-380 mg).  Molasses, 1 Tbsp (295 mg).  Mushrooms,  cup (280) mg.  Nectarine, 1 each (275 mg).  Nuts: almonds, peanuts, hazelnuts, [place], cashew, mixed, 1 oz (200 mg).  Nuts, pistachios, 1 oz (295 mg).  Orange, 1 each (240 mg).  Orange juice,  cup (235 mg).  Papaya, medium,  fruit (390 mg).  Peanut butter, chunky, 2 Tbsp (240 mg).  Peanut butter, smooth, 2 Tbsp (210 mg).  Pear, 1 medium (200 mg).  Pomegranate, 1 whole (400 mg).  Pomegranate juice,  cup (215 mg).  Pork, 3 oz (350 mg).  Potato chips, salted, 1 oz  (465 mg).  Potato, baked with skin, 1 medium (925 mg).  Potatoes, boiled,  cup (255 mg).  Potatoes, mashed,  cup (330 mg).  Prune juice,  cup (370 mg).  Prunes, 5 each (305 mg).  Pudding, chocolate,  cup (230 mg).  Pumpkin, canned,  cup (250 mg).  Raisins, seedless,  cup (270 mg).  Seeds, sunflower or pumpkin, 1 oz (240 mg).  Soy milk, 1 cup (300 mg).  Spinach,  cup (420 mg).  Spinach, canned,  cup (370 mg).  Sweet potato, baked with skin, 1 medium (450 mg).  Swiss chard,  cup (480 mg).  Tomato or vegetable juice,  cup (275 mg).  Tomato sauce or puree,  cup (400-550 mg).  Tomato, raw, 1 medium (290 mg).  Tomatoes, canned,  cup (200-300 mg).  [place], 3 oz (250 mg).  Wheat germ, 1 oz (250 mg).  Winter squash,  cup (250 mg).  Yogurt, plain or fruited, 6 oz (260-435 mg).  Zucchini,  cup (220 mg).  Moderate in potassium The following foods and beverages have 50-200 mg of potassium per serving:  Apple, 1 each (150 mg).  Apple juice,  cup (150 mg).  Applesauce,  cup (90 mg).  Apricot nectar,  cup (140 mg).  Asparagus, small spears,  cup or 6 spears (155 mg).  Bagel, cinnamon raisin, 1 each (130 mg).  Bagel, egg or plain, 4 in., 1 each (70 mg).  Beans, green,  cup (90 mg).  Beans, yellow,  cup (190 mg).  Beer, regular, 12 oz (100 mg).  Beets, canned,  cup (125 mg).  Blackberries,  cup (115 mg).  Blueberries,  cup (60 mg).  Bread, whole wheat, 1 slice (70 mg).  Broccoli, raw,  cup (145 mg).  Cabbage,  cup (150 mg).  Carrots, cooked or raw,  cup (180 mg).  Cauliflower, raw,  cup (150 mg).  Celery, raw,  cup (155 mg).  Cereal, bran flakes, cup (120-150 mg).  Cheese, cottage,  cup (110 mg).  Cherries, 10 each (150 mg).  Chocolate, 1 oz bar (165 mg).  Coffee, brewed 6 oz (90 mg).  Corn,  cup or 1 ear (195 mg).  Cucumbers,  cup (80 mg).  Egg, large, 1 each (60 mg).  Eggplant,  cup (60  mg).  Endive, raw, cup (80 mg).  English muffin, 1 each (65 mg).  Fish, orange roughy, 3 oz (150 mg).  Frankfurter, beef or pork, 1 each (75 mg).  Fruit cocktail,  cup (115 mg).  Grape juice,  cup (170 mg).  Grapefruit,  fruit (175 mg).  Grapes,  cup (155 mg).  Greens: kale, turnip, collard,  cup (110-150 mg).  Ice cream or frozen yogurt, chocolate,  cup (175 mg).  Ice cream or frozen yogurt, vanilla,  cup (120-150 mg).  Lemons, limes, 1 each (80 mg).  Lettuce, all types, 1 cup (100 mg).  Mixed vegetables,  cup (150 mg).  Mushrooms, raw,  cup (110 mg).  Nuts: walnuts, pecans, or macadamia, 1 oz (125 mg).  Oatmeal,  cup (80 mg).  Okra,  cup (110 mg).  Onions, raw,  cup (120 mg).  Peach, 1 each (185 mg).  Peaches, canned,  cup (120 mg).  Pears, canned,  cup (120 mg).  Peas, green, frozen,  cup (90 mg).  Peppers, green,  cup (130 mg).  Peppers, red,  cup (160 mg).  Pineapple juice,  cup (165 mg).  Pineapple, fresh or canned,  cup (100 mg).  Plums, 1 each (105 mg).  Pudding, vanilla,  cup (150 mg).  Raspberries,  cup (90 mg).  Rhubarb,  cup (115 mg).  Rice, wild,  cup (80 mg).  Shrimp, 3 oz (155 mg).  Spinach, raw, 1 cup (170 mg).  Strawberries,  cup (125 mg).  Summer squash  cup (175-200 mg).  Swiss chard, raw, 1 cup (135 mg).  Tangerines, 1 each (140 mg).  Tea, brewed, 6 oz (65 mg).  Turnips,  cup (140 mg).  Watermelon,  cup (85 mg).  Wine, red, table, 5 oz (180 mg).  Wine, white, table, 5 oz (100 mg).  Low in potassium The following foods and beverages have less than 50 mg of potassium per serving.  Bread, white, 1 slice (30 mg).  Carbonated beverages, 12 oz (less than 5 mg).  Cheese, 1 oz (20-30 mg).  Cranberries,  cup (45 mg).  Cranberry juice cocktail,  cup (20 mg).  Fats and oils, 1 Tbsp (less than 5 mg).  Hummus, 1 Tbsp (32 mg).  Nectar: papaya, mango, or pear,  cup (35  mg).  Rice, white or brown,  cup (50 mg).  Spaghetti or macaroni,  cup cooked (30 mg).  Tortilla, flour or corn, 1 each (50 mg).  Waffle, 4 in., 1 each (50 mg).  Water chestnuts,  cup (40 mg).  This information is not intended to replace advice given to you by your health care provider. Make sure you discuss any questions you have with your health care provider. Document Released: 09/08/2004 Document Revised: 07/03/2015 Document Reviewed: 12/22/2012 Elsevier Interactive Patient Education  [employer].

## 2017-04-25 NOTE — Assessment & Plan Note (Signed)
He has significant pancytopenia, likely due to side effects of chemo I would continue on reduced dose of chemotherapy and will modify schedule to every other week.   Depending on his blood count in 2 weeks, he may or may not need to continue on G-CSF support

## 2017-05-02 ENCOUNTER — Other Ambulatory Visit: Payer: Self-pay

## 2017-05-02 ENCOUNTER — Ambulatory Visit: Payer: Self-pay

## 2017-05-04 ENCOUNTER — Ambulatory Visit: Payer: Self-pay

## 2017-05-05 ENCOUNTER — Ambulatory Visit: Payer: Self-pay | Admitting: Cardiology

## 2017-05-09 ENCOUNTER — Inpatient Hospital Stay: Payer: Medicare Other

## 2017-05-09 ENCOUNTER — Inpatient Hospital Stay: Payer: Medicare Other | Attending: Hematology and Oncology

## 2017-05-09 VITALS — BP 133/65 | HR 81 | Temp 98.2°F | Resp 18

## 2017-05-09 DIAGNOSIS — C029 Malignant neoplasm of tongue, unspecified: Secondary | ICD-10-CM

## 2017-05-09 DIAGNOSIS — R110 Nausea: Secondary | ICD-10-CM

## 2017-05-09 DIAGNOSIS — C787 Secondary malignant neoplasm of liver and intrahepatic bile duct: Secondary | ICD-10-CM | POA: Diagnosis not present

## 2017-05-09 DIAGNOSIS — C01 Malignant neoplasm of base of tongue: Secondary | ICD-10-CM | POA: Diagnosis present

## 2017-05-09 DIAGNOSIS — Z5111 Encounter for antineoplastic chemotherapy: Secondary | ICD-10-CM | POA: Insufficient documentation

## 2017-05-09 DIAGNOSIS — Z5189 Encounter for other specified aftercare: Secondary | ICD-10-CM | POA: Insufficient documentation

## 2017-05-09 DIAGNOSIS — C770 Secondary and unspecified malignant neoplasm of lymph nodes of head, face and neck: Secondary | ICD-10-CM

## 2017-05-09 LAB — COMPREHENSIVE METABOLIC PANEL
ALBUMIN: 3.6 g/dL (ref 3.5–5.0)
ALK PHOS: 109 U/L (ref 40–150)
ALT: 18 U/L (ref 0–55)
AST: 14 U/L (ref 5–34)
Anion gap: 11 (ref 3–11)
BILIRUBIN TOTAL: 0.4 mg/dL (ref 0.2–1.2)
BUN: 11 mg/dL (ref 7–26)
CALCIUM: 9.5 mg/dL (ref 8.4–10.4)
CO2: 23 mmol/L (ref 22–29)
Chloride: 107 mmol/L (ref 98–109)
Creatinine, Ser: 0.79 mg/dL (ref 0.70–1.30)
GFR calc Af Amer: >60 mL/min (ref >60)
GLUCOSE: 174 mg/dL — AB (ref 70–140)
Potassium: 3.4 mmol/L — ABNORMAL LOW (ref 3.5–5.1)
Sodium: 141 mmol/L (ref 136–145)
TOTAL PROTEIN: 6.2 g/dL — AB (ref 6.4–8.3)

## 2017-05-09 LAB — CBC WITH DIFFERENTIAL/PLATELET
BASOS ABS: 0 10*3/uL (ref 0.0–0.1)
BASOS PCT: 1 %
Eosinophils Absolute: 0 10*3/uL (ref 0.0–0.5)
Eosinophils Relative: 1 %
HEMATOCRIT: 32.8 % — AB (ref 38.4–49.9)
HEMOGLOBIN: 11.1 g/dL — AB (ref 13.0–17.1)
LYMPHS PCT: 12 %
Lymphs Abs: 0.4 10*3/uL — ABNORMAL LOW (ref 0.9–3.3)
MCH: 31.8 pg (ref 27.2–33.4)
MCHC: 33.7 g/dL (ref 32.0–36.0)
MCV: 94.1 fL (ref 79.3–98.0)
MONOS PCT: 10 %
Monocytes Absolute: 0.3 10*3/uL (ref 0.1–0.9)
NEUTROS ABS: 2.5 10*3/uL (ref 1.5–6.5)
NEUTROS PCT: 76 %
Platelets: 109 10*3/uL — ABNORMAL LOW (ref 140–400)
RBC: 3.48 MIL/uL — ABNORMAL LOW (ref 4.20–5.82)
RDW: 16.9 % — ABNORMAL HIGH (ref 11.0–14.6)
WBC: 3.3 10*3/uL — ABNORMAL LOW (ref 4.0–10.3)

## 2017-05-09 MED ORDER — FAMOTIDINE IN NACL 20-0.9 MG/50ML-% IV SOLN
INTRAVENOUS | Status: AC
Start: 2017-05-09 — End: 2017-05-09
  Filled 2017-05-09: qty 50

## 2017-05-09 MED ORDER — FAMOTIDINE IN NACL 20-0.9 MG/50ML-% IV SOLN
20.0000 mg | Freq: Once | INTRAVENOUS | Status: AC
  Administered 2017-05-09: 20 mg via INTRAVENOUS

## 2017-05-09 MED ORDER — DIPHENHYDRAMINE HCL 50 MG/ML IJ SOLN
50.0000 mg | Freq: Once | INTRAMUSCULAR | Status: AC
  Administered 2017-05-09: 50 mg via INTRAVENOUS

## 2017-05-09 MED ORDER — SODIUM CHLORIDE 0.9 % IJ SOLN
10.0000 mL | INTRAMUSCULAR | Status: DC | PRN
  Administered 2017-05-09: 10 mL
  Filled 2017-05-09: qty 10

## 2017-05-09 MED ORDER — CARBOPLATIN CHEMO INJECTION 450 MG/45ML
159.0400 mg | Freq: Once | INTRAVENOUS | Status: AC
  Administered 2017-05-09: 160 mg via INTRAVENOUS
  Filled 2017-05-09: qty 16

## 2017-05-09 MED ORDER — SODIUM CHLORIDE 0.9% FLUSH
10.0000 mL | INTRAVENOUS | Status: DC | PRN
  Administered 2017-05-09: 10 mL
  Filled 2017-05-09: qty 10

## 2017-05-09 MED ORDER — SODIUM CHLORIDE 0.9 % IV SOLN
36.0000 mg/m2 | Freq: Once | INTRAVENOUS | Status: AC
  Administered 2017-05-09: 72 mg via INTRAVENOUS
  Filled 2017-05-09: qty 12

## 2017-05-09 MED ORDER — HEPARIN SOD (PORK) LOCK FLUSH 100 UNIT/ML IV SOLN
500.0000 [IU] | Freq: Once | INTRAVENOUS | Status: AC | PRN
  Administered 2017-05-09: 500 [IU]
  Filled 2017-05-09: qty 5

## 2017-05-09 MED ORDER — PALONOSETRON HCL INJECTION 0.25 MG/5ML
0.2500 mg | Freq: Once | INTRAVENOUS | Status: AC
  Administered 2017-05-09: 0.25 mg via INTRAVENOUS

## 2017-05-09 MED ORDER — PALONOSETRON HCL INJECTION 0.25 MG/5ML
INTRAVENOUS | Status: AC
Start: 2017-05-09 — End: 2017-05-09
  Filled 2017-05-09: qty 5

## 2017-05-09 MED ORDER — DIPHENHYDRAMINE HCL 50 MG/ML IJ SOLN
INTRAMUSCULAR | Status: AC
  Filled 2017-05-09: qty 1

## 2017-05-09 MED ORDER — SODIUM CHLORIDE 0.9 % IV SOLN
Freq: Once | INTRAVENOUS | Status: AC
  Administered 2017-05-09: 09:00:00 via INTRAVENOUS

## 2017-05-09 MED ORDER — DEXAMETHASONE SODIUM PHOSPHATE 100 MG/10ML IJ SOLN
20.0000 mg | Freq: Once | INTRAMUSCULAR | Status: AC
  Administered 2017-05-09: 20 mg via INTRAVENOUS
  Filled 2017-05-09: qty 2

## 2017-05-09 NOTE — Patient Instructions (Signed)
   [facility] Discharge Instructions for Patients Receiving Chemotherapy  Today you received the following chemotherapy agents Taxol and Carboplatin   To help prevent nausea and vomiting after your treatment, we encourage you to take your nausea medication as directed.    If you develop nausea and vomiting that is not controlled by your nausea medication, call the clinic.   BELOW ARE SYMPTOMS THAT SHOULD BE REPORTED IMMEDIATELY:  *FEVER GREATER THAN 100.5 F  *CHILLS WITH OR WITHOUT FEVER  NAUSEA AND VOMITING THAT IS NOT CONTROLLED WITH YOUR NAUSEA MEDICATION  *UNUSUAL SHORTNESS OF BREATH  *UNUSUAL BRUISING OR BLEEDING  TENDERNESS IN MOUTH AND THROAT WITH OR WITHOUT PRESENCE OF ULCERS  *URINARY PROBLEMS  *BOWEL PROBLEMS  UNUSUAL RASH Items with * indicate a potential emergency and should be followed up as soon as possible.  Feel free to call the clinic should you have any questions or concerns. The clinic phone number is [phone].  Please show the CHEMO ALERT CARD at check-in to the Emergency Department and triage nurse.   

## 2017-05-10 ENCOUNTER — Telehealth: Payer: Self-pay | Admitting: Hematology and Oncology

## 2017-05-10 ENCOUNTER — Other Ambulatory Visit: Payer: Self-pay | Admitting: Hematology and Oncology

## 2017-05-10 NOTE — Telephone Encounter (Signed)
Patient called to reschedule  °

## 2017-05-11 ENCOUNTER — Ambulatory Visit: Payer: Self-pay

## 2017-05-11 ENCOUNTER — Inpatient Hospital Stay: Payer: Medicare Other

## 2017-05-11 VITALS — BP 129/77 | HR 93 | Temp 98.2°F | Resp 20

## 2017-05-11 DIAGNOSIS — C01 Malignant neoplasm of base of tongue: Secondary | ICD-10-CM

## 2017-05-11 DIAGNOSIS — R110 Nausea: Secondary | ICD-10-CM

## 2017-05-11 DIAGNOSIS — Z5111 Encounter for antineoplastic chemotherapy: Secondary | ICD-10-CM | POA: Diagnosis not present

## 2017-05-11 MED ORDER — PEGFILGRASTIM-CBQV 6 MG/0.6ML ~~LOC~~ SOSY
6.0000 mg | PREFILLED_SYRINGE | Freq: Once | SUBCUTANEOUS | Status: AC
  Administered 2017-05-11: 6 mg via SUBCUTANEOUS

## 2017-05-11 MED ORDER — PEGFILGRASTIM-CBQV 6 MG/0.6ML ~~LOC~~ SOSY
PREFILLED_SYRINGE | SUBCUTANEOUS | Status: AC
  Filled 2017-05-11: qty 0.6

## 2017-05-11 NOTE — Patient Instructions (Signed)
Pegfilgrastim injection What is this medicine? PEGFILGRASTIM (PEG fil gra stim) is a long-acting granulocyte colony-stimulating factor that stimulates the growth of neutrophils, a type of white blood cell important in the body's fight against infection. It is used to reduce the incidence of fever and infection in patients with certain types of cancer who are receiving chemotherapy that affects the bone marrow, and to increase survival after being exposed to high doses of radiation. This medicine may be used for other purposes; ask your health care provider or pharmacist if you have questions. COMMON BRAND NAME(S): Neulasta What should I tell my health care provider before I take this medicine? They need to know if you have any of these conditions: -kidney disease -latex allergy -ongoing radiation therapy -sickle cell disease -skin reactions to acrylic adhesives (On-Body Injector only) -an unusual or allergic reaction to pegfilgrastim, filgrastim, other medicines, foods, dyes, or preservatives -pregnant or trying to get pregnant -breast-feeding How should I use this medicine? This medicine is for injection under the skin. If you get this medicine at home, you will be taught how to prepare and give the pre-filled syringe or how to use the On-body Injector. Refer to the patient Instructions for Use for detailed instructions. Use exactly as directed. Tell your healthcare provider immediately if you suspect that the On-body Injector may not have performed as intended or if you suspect the use of the On-body Injector resulted in a missed or partial dose. It is important that you put your used needles and syringes in a special sharps container. Do not put them in a trash can. If you do not have a sharps container, call your pharmacist or healthcare provider to get one. Talk to your pediatrician regarding the use of this medicine in children. While this drug may be prescribed for selected conditions,  precautions do apply. Overdosage: If you think you have taken too much of this medicine contact a poison control center or emergency room at once. NOTE: This medicine is only for you. Do not share this medicine with others. What if I miss a dose? It is important not to miss your dose. Call your doctor or health care professional if you miss your dose. If you miss a dose due to an On-body Injector failure or leakage, a new dose should be administered as soon as possible using a single prefilled syringe for manual use. What may interact with this medicine? Interactions have not been studied. Give your health care provider a list of all the medicines, herbs, non-prescription drugs, or dietary supplements you use. Also tell them if you smoke, drink alcohol, or use illegal drugs. Some items may interact with your medicine. This list may not describe all possible interactions. Give your health care provider a list of all the medicines, herbs, non-prescription drugs, or dietary supplements you use. Also tell them if you smoke, drink alcohol, or use illegal drugs. Some items may interact with your medicine. What should I watch for while using this medicine? You may need blood work done while you are taking this medicine. If you are going to need a MRI, CT scan, or other procedure, tell your doctor that you are using this medicine (On-Body Injector only). What side effects may I notice from receiving this medicine? Side effects that you should report to your doctor or health care professional as soon as possible: -allergic reactions like skin rash, itching or hives, swelling of the face, lips, or tongue -dizziness -fever -pain, redness, or irritation at site   where injected -pinpoint red spots on the skin -red or dark-brown urine -shortness of breath or breathing problems -stomach or side pain, or pain at the shoulder -swelling -tiredness -trouble passing urine or change in the amount of urine Side  effects that usually do not require medical attention (report to your doctor or health care professional if they continue or are bothersome): -bone pain -muscle pain This list may not describe all possible side effects. Call your doctor for medical advice about side effects. You may report side effects to FDA at 1-800-FDA-1088. Where should I keep my medicine? Keep out of the reach of children. Store pre-filled syringes in a refrigerator between 2 and 8 degrees C (36 and 46 degrees F). Do not freeze. Keep in carton to protect from light. Throw away this medicine if it is left out of the refrigerator for more than 48 hours. Throw away any unused medicine after the expiration date. NOTE: This sheet is a summary. It may not cover all possible information. If you have questions about this medicine, talk to your doctor, pharmacist, or health care provider.  2018 Elsevier/Gold Standard (2016-01-22 12:58:03)  

## 2017-05-20 ENCOUNTER — Telehealth: Payer: Self-pay | Admitting: Hematology and Oncology

## 2017-05-20 NOTE — Telephone Encounter (Signed)
Spoke to patient regarding upcoming April appointments per 4/11 sch message

## 2017-05-23 ENCOUNTER — Inpatient Hospital Stay: Payer: Medicare Other

## 2017-05-23 ENCOUNTER — Encounter: Payer: Self-pay | Admitting: Hematology and Oncology

## 2017-05-23 ENCOUNTER — Inpatient Hospital Stay (HOSPITAL_BASED_OUTPATIENT_CLINIC_OR_DEPARTMENT_OTHER): Payer: Medicare Other | Admitting: Hematology and Oncology

## 2017-05-23 ENCOUNTER — Telehealth: Payer: Self-pay | Admitting: Hematology and Oncology

## 2017-05-23 VITALS — BP 114/64 | HR 80 | Temp 98.6°F | Resp 18 | Ht 68.0 in | Wt 161.5 lb

## 2017-05-23 DIAGNOSIS — E1140 Type 2 diabetes mellitus with diabetic neuropathy, unspecified: Secondary | ICD-10-CM

## 2017-05-23 DIAGNOSIS — C787 Secondary malignant neoplasm of liver and intrahepatic bile duct: Secondary | ICD-10-CM | POA: Diagnosis not present

## 2017-05-23 DIAGNOSIS — C029 Malignant neoplasm of tongue, unspecified: Secondary | ICD-10-CM

## 2017-05-23 DIAGNOSIS — C772 Secondary and unspecified malignant neoplasm of intra-abdominal lymph nodes: Secondary | ICD-10-CM

## 2017-05-23 DIAGNOSIS — C01 Malignant neoplasm of base of tongue: Secondary | ICD-10-CM

## 2017-05-23 DIAGNOSIS — R110 Nausea: Secondary | ICD-10-CM

## 2017-05-23 DIAGNOSIS — Z794 Long term (current) use of insulin: Secondary | ICD-10-CM | POA: Diagnosis not present

## 2017-05-23 DIAGNOSIS — C770 Secondary and unspecified malignant neoplasm of lymph nodes of head, face and neck: Secondary | ICD-10-CM

## 2017-05-23 DIAGNOSIS — D61818 Other pancytopenia: Secondary | ICD-10-CM | POA: Diagnosis not present

## 2017-05-23 DIAGNOSIS — Z5111 Encounter for antineoplastic chemotherapy: Secondary | ICD-10-CM | POA: Diagnosis not present

## 2017-05-23 LAB — CBC WITH DIFFERENTIAL/PLATELET
BASOS PCT: 0 %
Basophils Absolute: 0 10*3/uL (ref 0.0–0.1)
Eosinophils Absolute: 0 10*3/uL (ref 0.0–0.5)
Eosinophils Relative: 0 %
HCT: 33.9 % — ABNORMAL LOW (ref 38.4–49.9)
HEMOGLOBIN: 11.1 g/dL — AB (ref 13.0–17.1)
LYMPHS ABS: 0.5 10*3/uL — AB (ref 0.9–3.3)
LYMPHS PCT: 6 %
MCH: 32.3 pg (ref 27.2–33.4)
MCHC: 32.7 g/dL (ref 32.0–36.0)
MCV: 98.5 fL — AB (ref 79.3–98.0)
MONO ABS: 0.5 10*3/uL (ref 0.1–0.9)
MONOS PCT: 5 %
NEUTROS PCT: 89 %
Neutro Abs: 8.4 10*3/uL — ABNORMAL HIGH (ref 1.5–6.5)
Platelets: 137 10*3/uL — ABNORMAL LOW (ref 140–400)
RBC: 3.44 MIL/uL — ABNORMAL LOW (ref 4.20–5.82)
RDW: 15.2 % — AB (ref 11.0–14.6)
WBC: 9.4 10*3/uL (ref 4.0–10.3)

## 2017-05-23 LAB — COMPREHENSIVE METABOLIC PANEL
ALBUMIN: 3.4 g/dL — AB (ref 3.5–5.0)
ALK PHOS: 148 U/L (ref 40–150)
ALT: 18 U/L (ref 0–55)
ANION GAP: 9 (ref 3–11)
AST: 15 U/L (ref 5–34)
BUN: 12 mg/dL (ref 7–26)
CALCIUM: 9.4 mg/dL (ref 8.4–10.4)
CHLORIDE: 105 mmol/L (ref 98–109)
CO2: 26 mmol/L (ref 22–29)
Creatinine, Ser: 0.87 mg/dL (ref 0.70–1.30)
GFR calc non Af Amer: >60 mL/min (ref >60)
GLUCOSE: 180 mg/dL — AB (ref 70–140)
POTASSIUM: 3.6 mmol/L (ref 3.5–5.1)
SODIUM: 140 mmol/L (ref 136–145)
Total Bilirubin: 0.3 mg/dL (ref 0.2–1.2)
Total Protein: 6.3 g/dL — ABNORMAL LOW (ref 6.4–8.3)

## 2017-05-23 MED ORDER — PACLITAXEL CHEMO INJECTION 300 MG/50ML
36.0000 mg/m2 | Freq: Once | INTRAVENOUS | Status: AC
  Administered 2017-05-23: 72 mg via INTRAVENOUS
  Filled 2017-05-23: qty 12

## 2017-05-23 MED ORDER — LIDOCAINE-PRILOCAINE 2.5-2.5 % EX CREA: 1.0000 "application " | TOPICAL_CREAM | CUTANEOUS | 6 refills | Status: AC | PRN

## 2017-05-23 MED ORDER — DIPHENHYDRAMINE HCL 50 MG/ML IJ SOLN
INTRAMUSCULAR | Status: AC
  Filled 2017-05-23: qty 1

## 2017-05-23 MED ORDER — PALONOSETRON HCL INJECTION 0.25 MG/5ML
INTRAVENOUS | Status: AC
  Filled 2017-05-23: qty 5

## 2017-05-23 MED ORDER — HEPARIN SOD (PORK) LOCK FLUSH 100 UNIT/ML IV SOLN
500.0000 [IU] | Freq: Once | INTRAVENOUS | Status: AC | PRN
  Administered 2017-05-23: 500 [IU]
  Filled 2017-05-23: qty 5

## 2017-05-23 MED ORDER — SODIUM CHLORIDE 0.9% FLUSH
10.0000 mL | INTRAVENOUS | Status: DC | PRN
  Administered 2017-05-23: 10 mL
  Filled 2017-05-23: qty 10

## 2017-05-23 MED ORDER — SODIUM CHLORIDE 0.9 % IV SOLN
20.0000 mg | Freq: Once | INTRAVENOUS | Status: AC
  Administered 2017-05-23: 20 mg via INTRAVENOUS
  Filled 2017-05-23: qty 2

## 2017-05-23 MED ORDER — DIPHENHYDRAMINE HCL 50 MG/ML IJ SOLN
50.0000 mg | Freq: Once | INTRAMUSCULAR | Status: AC
  Administered 2017-05-23: 50 mg via INTRAVENOUS

## 2017-05-23 MED ORDER — SODIUM CHLORIDE 0.9 % IV SOLN
159.0400 mg | Freq: Once | INTRAVENOUS | Status: AC
  Administered 2017-05-23: 160 mg via INTRAVENOUS
  Filled 2017-05-23: qty 16

## 2017-05-23 MED ORDER — FAMOTIDINE IN NACL 20-0.9 MG/50ML-% IV SOLN
INTRAVENOUS | Status: AC
  Filled 2017-05-23: qty 50

## 2017-05-23 MED ORDER — SODIUM CHLORIDE 0.9% FLUSH
10.0000 mL | Freq: Once | INTRAVENOUS | Status: AC
  Administered 2017-05-23: 10 mL
  Filled 2017-05-23: qty 10

## 2017-05-23 MED ORDER — PALONOSETRON HCL INJECTION 0.25 MG/5ML
0.2500 mg | Freq: Once | INTRAVENOUS | Status: AC
  Administered 2017-05-23: 0.25 mg via INTRAVENOUS

## 2017-05-23 MED ORDER — FAMOTIDINE IN NACL 20-0.9 MG/50ML-% IV SOLN
20.0000 mg | Freq: Once | INTRAVENOUS | Status: AC
  Administered 2017-05-23: 20 mg via INTRAVENOUS

## 2017-05-23 MED ORDER — SODIUM CHLORIDE 0.9 % IV SOLN
Freq: Once | INTRAVENOUS | Status: AC
  Administered 2017-05-23: 09:00:00 via INTRAVENOUS

## 2017-05-23 NOTE — Assessment & Plan Note (Signed)
The liver masses are shrinking We will continue treatment indefinitely His liver function tests are within normal limits

## 2017-05-23 NOTE — Assessment & Plan Note (Signed)
He has significant pancytopenia, likely due to side effects of chemo I would continue on reduced dose of chemotherapy and will modify schedule to every other week.   Will continue on G-CSF support

## 2017-05-23 NOTE — Assessment & Plan Note (Signed)
His last PET CT scan in Feb 2019 showed positive response to treatment Unfortunately, he has significant pancytopenia We discussed different strategies including dose reduction, changing chemo schedule and add Neulasta With his permission, we would proceed with chemotherapy as scheduled every 2 weeks with GCSF support I plan to repeat imaging study in May for objective response of therapy

## 2017-05-23 NOTE — Progress Notes (Signed)
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 in Feb 2019 showed positive response to treatment Unfortunately, he has significant pancytopenia We discussed different strategies including dose reduction, changing chemo schedule and add Neulasta With his permission, we would proceed with chemotherapy as scheduled every 2 weeks with GCSF support I plan to repeat imaging study in May for objective response of therapy  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   Will continue on G-CSF support  Diabetes mellitus with diabetic neuropathy, with long-term current use of insulin ([place]) He was successfully taper off prednisone therapy a week ago He will continue medical management for diabetes   Orders Placed This Encounter  Procedures  . NM PET Image Restag (PS) Skull Base To Thigh    Standing Status:   Future    Standing Expiration Date:   05/24/2018    Order Specific Question:   If indicated for the ordered procedure, I authorize the administration of a radiopharmaceutical per Radiology protocol    Answer:   Yes    Order Specific Question:   Preferred imaging  location?    Answer:   [facility]    Order Specific Question:   Radiology Contrast Protocol - do NOT remove file path    Answer:   [email or web address]    INTERVAL HISTORY: Please see below for problem oriented charting. He returns with his wife for further follow-up Denies peripheral neuropathy from treatment No recent infection He has successfully taper off prednisone a week ago He denies nausea or vomiting from the treatment. Denies abdominal pain.  SUMMARY OF ONCOLOGIC HISTORY:   Tongue cancer ([place])   08/20/2015 Miscellaneous    He was evaluated by ENT      08/22/2015 Imaging    CT neck showed malignant adenopathy right supraclavicular region compatible with metastatic disease. Biopsy recommended. No definite pharyngeal mass lesion identified by CT.       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 No other primary or metastatic cancer. 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         11/13/2015 Procedure    Placement of 20 French pull-through percutaneous  gastrostomy tube.      03/11/2016 PET scan    Resolution of hypermetabolic right cervical lymphadenopathy since prior study . No other hypermetabolic lymphadenopathy identified. New hypermetabolic lesion and caudate lobe of liver, highly suspicious for liver metastasis. Recommend liver protocol abdomen MRI without and with contrast for further evaluation. New patchy airspace disease in both lung apices with FDG uptake.  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 Recommend continued attention on follow-up imaging.       03/19/2016 Imaging    MRI showed 3.4 cm heterogeneously enhancing mass in caudate lobe of liver, consistent liver metastasis. No other sites of metastatic disease identified within the liver or abdomen      03/26/2016 Procedure    Ultrasound and fluoroscopically guided right internal jugular single lumen power port catheter insertion. Tip in the SVC/RA junction. 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 Metabolic response to therapy of caudate lobe liver metastasis. Low-level activity in this area is similar to the surrounding liver.  2. Hypermetabolism in the right inguinal crease without concurrent adenopathy. Especially given subtle surrounding edema, favor cellulitis. Consider physical exam correlation. 3. Lack of visualization of previously described 7 mm left lower lobe pulmonary nodule. 4. Diffuse marrow hypermetabolism, favoring stimulation by chemotherapy.      09/20/2016 PET scan    1. Reduced activity in the hypermetabolic caudate lobe lesion of the liver, likely a metastatic lesion. 2. Scattered likely inflammatory activity along the glenohumeral and acromioclavicular joints and in the hips. 3. Healing of bilateral upper rib fractures. 4. Other imaging findings of potential clinical significance: Aortic Atherosclerosis (ICD10-I70.0). Coronary and carotid atherosclerosis. Photopenic  right renal cysts.      11/17/2016 Procedure    Successful removal of 20 French pull-through gastrostomy tube.      12/09/2016 PET scan    1. Slight increase in size and degree of uptake associated with caudate lobe lesion consistent with metastatic disease 2. Increase in size and FDG uptake associated with portacaval lymph node 3. Aortic Atherosclerosis (ICD10-I70.0). Coronary atherosclerotic changes noted.      12/27/2016 -  Chemotherapy    He received carboplatin and taxol      01/19/2017 - 01/25/2017 Hospital Admission    He was admitted to the hospital with sepsis secondary to cholangitis, managed conservatively      01/21/2017 Imaging    1. Cholelithiasis with gallbladder distension and subtle pericholecystic edema. Findings suspicious for acute cholecystitis. Right upper quadrant ultrasound could confirm. 2. Similar caudate lobe metastasis. Similar borderline porta hepatis adenopathy. 3. Left nephrolithiasis. 4. New tiny right pleural effusion with bibasilar atelectasis. 5. Trace air in the nondependent urinary bladder. Correlate with instrumentation. 6. Coronary artery atherosclerosis. Aortic Atherosclerosis (ICD10-I70.0).        03/25/2017 PET scan    1. Reduced size and significantly reduced activity in the hepatic caudate lobe metastatic lesion. Significantly reduced activity of the adjacent porta hepatis lymph node. Appearance compatible with interval  effective therapy. 2. Ground-glass and sub solid densities anteriorly in the right upper lobe appear to conform to several secondary pulmonary lobules and are new compared to the right our prior exam. Low-grade metabolic activity at 2.7. Probably alveolitis/low-grade pneumonia, the morphology would be atypical for metastatic disease, but surveillance is suggested. 3. There is some focal accentuated metabolic activity at the sigmoid colon anastomotic site. No CT correlate. There is no abnormal activity in this vicinity on  12/09/2016 and I suspect that this is physiologic, but this area also warrant surveillance. 4. Other imaging findings of potential clinical significance: Aortic Atherosclerosis (ICD10-I70.0). Coronary atherosclerosis. Cholelithiasis potentially with minimal gallbladder wall thickening. Sigmoid colon diverticulosis. Prostatomegaly.       Cancer of base of tongue (HCC)    REVIEW OF SYSTEMS:   Constitutional: Denies fevers, chills or abnormal weight loss Eyes: Denies blurriness of vision Ears, nose, mouth, throat, and face: Denies mucositis or sore throat Respiratory: Denies cough, dyspnea or wheezes Cardiovascular: Denies palpitation, chest discomfort or lower extremity swelling Gastrointestinal:  Denies nausea, heartburn or change in bowel habits Skin: Denies abnormal skin rashes Lymphatics: Denies new lymphadenopathy or easy bruising Neurological:Denies numbness, tingling or new weaknesses Behavioral/Psych: Mood is stable, no new changes  All other systems were reviewed with the patient and are negative.  I have reviewed the past medical history, past surgical history, social history and family history with the patient and they are unchanged from previous note.  ALLERGIES:  is allergic to cetuximab.  MEDICATIONS:  Current Outpatient Medications  Medication Sig Dispense Refill  . clopidogrel (PLAVIX) 75 MG tablet TAKE 1 TABLET EVERY DAY 90 tablet 2  . insulin glargine (LANTUS) 100 UNIT/ML injection Inject 0.5 mLs (50 Units total) into the skin at bedtime. 10 mL   . lidocaine-prilocaine (EMLA) cream Apply 1 application topically as needed. 30 g 6  . mirtazapine (REMERON) 30 MG tablet Take 1 tablet (30 mg total) by mouth at bedtime. 90 tablet 11  . Multiple Vitamin (MULTIVITAMIN WITH MINERALS) TABS tablet Take 1 tablet by mouth daily. 30 tablet 0  . ondansetron (ZOFRAN) 8 MG tablet Take 1 tablet (8 mg total) by mouth every 8 (eight) hours as needed for nausea or vomiting. 60 tablet 11   . polyethylene glycol (MIRALAX / GLYCOLAX) packet Take 17 g by mouth daily as needed. 14 each 0   No current facility-administered medications for this visit.    Facility-Administered Medications Ordered in Other Visits  Medication Dose Route Frequency Provider Last Rate Last Dose  . CARBOplatin (PARAPLATIN) 160 mg in sodium chloride 0.9 % 250 mL chemo infusion  160 mg Intravenous Once Alvy Bimler, Latoyia Tecson, MD      . dexamethasone (DECADRON) 20 mg in sodium chloride 0.9 % 50 mL IVPB  20 mg Intravenous Once Alvy Bimler, Braison Snoke, MD      . famotidine (PEPCID) IVPB 20 mg premix  20 mg Intravenous Once Alvy Bimler, Petro Talent, MD   20 mg at 05/23/17 0941  . heparin lock flush 100 unit/mL  500 Units Intracatheter Once PRN Alvy Bimler, Camden Knotek, MD      . PACLitaxel (TAXOL) 72 mg in sodium chloride 0.9 % 150 mL chemo infusion (</= 80mg /m2)  36 mg/m2 (Treatment Plan Recorded) Intravenous Once Loistine Eberlin, MD      . sodium chloride flush (NS) 0.9 % injection 10 mL  10 mL Intracatheter PRN Alvy Bimler, Anab Vivar, MD        PHYSICAL EXAMINATION: ECOG PERFORMANCE STATUS: 1 - Symptomatic but completely ambulatory  Vitals:  05/23/17 0807  BP: 114/64  Pulse: 80  Resp: 18  Temp: 98.6 F (37 C)  SpO2: 100%   Filed Weights   05/23/17 0807  Weight: 161 lb 8 oz (73.3 kg)    GENERAL:alert, no distress and comfortable SKIN: skin color, texture, turgor are normal, no rashes or significant lesions EYES: normal, Conjunctiva are pink and non-injected, sclera clear OROPHARYNX:no exudate, no erythema and lips, buccal mucosa, and tongue normal  NECK: supple, thyroid normal size, non-tender, without nodularity LYMPH:  no palpable lymphadenopathy in the cervical, axillary or inguinal LUNGS: clear to auscultation and percussion with normal breathing effort HEART: regular rate & rhythm and no murmurs and no lower extremity edema ABDOMEN:abdomen soft, non-tender and normal bowel sounds Musculoskeletal:no cyanosis of digits and no clubbing  NEURO: alert &  oriented x 3 with fluent speech, no focal motor/sensory deficits  LABORATORY DATA:  I have reviewed the data as listed    Component Value Date/Time   NA 140 05/23/2017 0750   NA 140 02/07/2017 0811   K 3.6 05/23/2017 0750   K 3.9 02/07/2017 0811   CL 105 05/23/2017 0750   CO2 26 05/23/2017 0750   CO2 25 02/07/2017 0811   GLUCOSE 180 (H) 05/23/2017 0750   GLUCOSE 118 02/07/2017 0811   BUN 12 05/23/2017 0750   BUN 17.8 02/07/2017 0811   CREATININE 0.87 05/23/2017 0750   CREATININE 1.0 02/07/2017 0811   CALCIUM 9.4 05/23/2017 0750   CALCIUM 9.4 02/07/2017 0811   PROT 6.3 (L) 05/23/2017 0750   PROT 6.7 02/07/2017 0811   ALBUMIN 3.4 (L) 05/23/2017 0750   ALBUMIN 3.2 (L) 02/07/2017 0811   AST 15 05/23/2017 0750   AST 16 02/07/2017 0811   ALT 18 05/23/2017 0750   ALT 17 02/07/2017 0811   ALKPHOS 148 05/23/2017 0750   ALKPHOS 144 02/07/2017 0811   BILITOT 0.3 05/23/2017 0750   BILITOT 0.70 02/07/2017 0811   GFRNONAA >60 05/23/2017 0750   GFRAA >60 05/23/2017 0750    No results found for: SPEP, UPEP  Lab Results  Component Value Date   WBC 9.4 05/23/2017   NEUTROABS 8.4 (H) 05/23/2017   HGB 11.1 (L) 05/23/2017   HCT 33.9 (L) 05/23/2017   MCV 98.5 (H) 05/23/2017   PLT 137 (L) 05/23/2017      Chemistry      Component Value Date/Time   NA 140 05/23/2017 0750   NA 140 02/07/2017 0811   K 3.6 05/23/2017 0750   K 3.9 02/07/2017 0811   CL 105 05/23/2017 0750   CO2 26 05/23/2017 0750   CO2 25 02/07/2017 0811   BUN 12 05/23/2017 0750   BUN 17.8 02/07/2017 0811   CREATININE 0.87 05/23/2017 0750   CREATININE 1.0 02/07/2017 0811   GLU 78 11/13/2015 1134      Component Value Date/Time   CALCIUM 9.4 05/23/2017 0750   CALCIUM 9.4 02/07/2017 0811   ALKPHOS 148 05/23/2017 0750   ALKPHOS 144 02/07/2017 0811   AST 15 05/23/2017 0750   AST 16 02/07/2017 0811   ALT 18 05/23/2017 0750   ALT 17 02/07/2017 0811   BILITOT 0.3 05/23/2017 0750   BILITOT 0.70 02/07/2017 0811     All questions were answered. The patient knows to call the clinic with any problems, questions or concerns. No barriers to learning was detected.  I spent 20 minutes counseling the patient face to face. The total time spent in the appointment was 25 minutes and more than 50%  was on counseling and review of test results  Heath Lark, MD 05/23/2017 9:54 AM

## 2017-05-23 NOTE — Assessment & Plan Note (Signed)
He was successfully taper off prednisone therapy a week ago He will continue medical management for diabetes

## 2017-05-23 NOTE — Telephone Encounter (Signed)
Patient scheduled per 4/15 los. Patient will receive updated copy of schedule in treatment area.

## 2017-05-23 NOTE — Patient Instructions (Signed)
[place] Discharge Instructions for Patients Receiving Chemotherapy  Today you received the following chemotherapy agents: Taxol and Carboplatin.   To help prevent nausea and vomiting after your treatment, we encourage you to take your nausea medication: Zofran. You may take one every 8 hours as needed, beginning 05/26/17.  If you develop nausea and vomiting that is not controlled by your nausea medication, call the clinic.   BELOW ARE SYMPTOMS THAT SHOULD BE REPORTED IMMEDIATELY:  *FEVER GREATER THAN 100.5 F  *CHILLS WITH OR WITHOUT FEVER  NAUSEA AND VOMITING THAT IS NOT CONTROLLED WITH YOUR NAUSEA MEDICATION  *UNUSUAL SHORTNESS OF BREATH  *UNUSUAL BRUISING OR BLEEDING  TENDERNESS IN MOUTH AND THROAT WITH OR WITHOUT PRESENCE OF ULCERS  *URINARY PROBLEMS  *BOWEL PROBLEMS  UNUSUAL RASH Items with * indicate a potential emergency and should be followed up as soon as possible.  Feel free to call the clinic should you have any questions or concerns. The clinic phone number is [phone].  Please show the [place] at check-in to the Emergency Department and triage nurse.

## 2017-05-25 ENCOUNTER — Inpatient Hospital Stay: Payer: Medicare Other

## 2017-05-25 DIAGNOSIS — Z5111 Encounter for antineoplastic chemotherapy: Secondary | ICD-10-CM | POA: Diagnosis not present

## 2017-05-25 DIAGNOSIS — C01 Malignant neoplasm of base of tongue: Secondary | ICD-10-CM

## 2017-05-25 DIAGNOSIS — R110 Nausea: Secondary | ICD-10-CM

## 2017-05-25 MED ORDER — HEPARIN SOD (PORK) LOCK FLUSH 100 UNIT/ML IV SOLN
250.0000 [IU] | Freq: Once | INTRAVENOUS | Status: DC | PRN
  Filled 2017-05-25: qty 5

## 2017-05-25 MED ORDER — ALTEPLASE 2 MG IJ SOLR
2.0000 mg | Freq: Once | INTRAMUSCULAR | Status: DC | PRN
  Filled 2017-05-25: qty 2

## 2017-05-25 MED ORDER — SODIUM CHLORIDE 0.9 % IV SOLN: Freq: Once | INTRAVENOUS | Status: DC

## 2017-05-25 MED ORDER — PEGFILGRASTIM-CBQV 6 MG/0.6ML ~~LOC~~ SOSY
6.0000 mg | PREFILLED_SYRINGE | Freq: Once | SUBCUTANEOUS | Status: AC
Start: 2017-05-25 — End: 2017-05-25
  Administered 2017-05-25: 6 mg via SUBCUTANEOUS

## 2017-05-25 MED ORDER — SODIUM CHLORIDE 0.9 % IJ SOLN
10.0000 mL | INTRAMUSCULAR | Status: DC | PRN
  Filled 2017-05-25: qty 10

## 2017-05-25 MED ORDER — PEGFILGRASTIM-CBQV 6 MG/0.6ML ~~LOC~~ SOSY
PREFILLED_SYRINGE | SUBCUTANEOUS | Status: AC
  Filled 2017-05-25: qty 0.6

## 2017-05-25 MED ORDER — HEPARIN SOD (PORK) LOCK FLUSH 100 UNIT/ML IV SOLN
500.0000 [IU] | Freq: Once | INTRAVENOUS | Status: DC | PRN
  Filled 2017-05-25: qty 5

## 2017-05-25 NOTE — Patient Instructions (Signed)
Pegfilgrastim injection What is this medicine? PEGFILGRASTIM (PEG fil gra stim) is a long-acting granulocyte colony-stimulating factor that stimulates the growth of neutrophils, a type of white blood cell important in the body's fight against infection. It is used to reduce the incidence of fever and infection in patients with certain types of cancer who are receiving chemotherapy that affects the bone marrow, and to increase survival after being exposed to high doses of radiation. This medicine may be used for other purposes; ask your health care provider or pharmacist if you have questions. COMMON BRAND NAME(S): Neulasta What should I tell my health care provider before I take this medicine? They need to know if you have any of these conditions: -kidney disease -latex allergy -ongoing radiation therapy -sickle cell disease -skin reactions to acrylic adhesives (On-Body Injector only) -an unusual or allergic reaction to pegfilgrastim, filgrastim, other medicines, foods, dyes, or preservatives -pregnant or trying to get pregnant -breast-feeding How should I use this medicine? This medicine is for injection under the skin. If you get this medicine at home, you will be taught how to prepare and give the pre-filled syringe or how to use the On-body Injector. Refer to the patient Instructions for Use for detailed instructions. Use exactly as directed. Tell your healthcare provider immediately if you suspect that the On-body Injector may not have performed as intended or if you suspect the use of the On-body Injector resulted in a missed or partial dose. It is important that you put your used needles and syringes in a special sharps container. Do not put them in a trash can. If you do not have a sharps container, call your pharmacist or healthcare provider to get one. Talk to your pediatrician regarding the use of this medicine in children. While this drug may be prescribed for selected conditions,  precautions do apply. Overdosage: If you think you have taken too much of this medicine contact a poison control center or emergency room at once. NOTE: This medicine is only for you. Do not share this medicine with others. What if I miss a dose? It is important not to miss your dose. Call your doctor or health care professional if you miss your dose. If you miss a dose due to an On-body Injector failure or leakage, a new dose should be administered as soon as possible using a single prefilled syringe for manual use. What may interact with this medicine? Interactions have not been studied. Give your health care provider a list of all the medicines, herbs, non-prescription drugs, or dietary supplements you use. Also tell them if you smoke, drink alcohol, or use illegal drugs. Some items may interact with your medicine. This list may not describe all possible interactions. Give your health care provider a list of all the medicines, herbs, non-prescription drugs, or dietary supplements you use. Also tell them if you smoke, drink alcohol, or use illegal drugs. Some items may interact with your medicine. What should I watch for while using this medicine? You may need blood work done while you are taking this medicine. If you are going to need a MRI, CT scan, or other procedure, tell your doctor that you are using this medicine (On-Body Injector only). What side effects may I notice from receiving this medicine? Side effects that you should report to your doctor or health care professional as soon as possible: -allergic reactions like skin rash, itching or hives, swelling of the face, lips, or tongue -dizziness -fever -pain, redness, or irritation at site   where injected -pinpoint red spots on the skin -red or dark-brown urine -shortness of breath or breathing problems -stomach or side pain, or pain at the shoulder -swelling -tiredness -trouble passing urine or change in the amount of urine Side  effects that usually do not require medical attention (report to your doctor or health care professional if they continue or are bothersome): -bone pain -muscle pain This list may not describe all possible side effects. Call your doctor for medical advice about side effects. You may report side effects to FDA at 1-800-FDA-1088. Where should I keep my medicine? Keep out of the reach of children. Store pre-filled syringes in a refrigerator between 2 and 8 degrees C (36 and 46 degrees F). Do not freeze. Keep in carton to protect from light. Throw away this medicine if it is left out of the refrigerator for more than 48 hours. Throw away any unused medicine after the expiration date. NOTE: This sheet is a summary. It may not cover all possible information. If you have questions about this medicine, talk to your doctor, pharmacist, or health care provider.  2018 Elsevier/Gold Standard (2016-01-22 12:58:03)  

## 2017-05-25 NOTE — Progress Notes (Signed)
Prior auth for Lidocaine-Prilocaine 2.5% cream has been approved.

## 2017-05-25 NOTE — Progress Notes (Signed)
Submitted prior auth request for Lidocaine-Prilocaine 2.5-2.5% Cream. Status is pending.

## 2017-06-06 ENCOUNTER — Inpatient Hospital Stay: Payer: Medicare Other

## 2017-06-06 VITALS — BP 150/84 | HR 85 | Temp 98.8°F | Resp 16

## 2017-06-06 DIAGNOSIS — C770 Secondary and unspecified malignant neoplasm of lymph nodes of head, face and neck: Secondary | ICD-10-CM

## 2017-06-06 DIAGNOSIS — C787 Secondary malignant neoplasm of liver and intrahepatic bile duct: Secondary | ICD-10-CM

## 2017-06-06 DIAGNOSIS — C029 Malignant neoplasm of tongue, unspecified: Secondary | ICD-10-CM

## 2017-06-06 DIAGNOSIS — R110 Nausea: Secondary | ICD-10-CM

## 2017-06-06 DIAGNOSIS — Z5111 Encounter for antineoplastic chemotherapy: Secondary | ICD-10-CM | POA: Diagnosis not present

## 2017-06-06 DIAGNOSIS — C01 Malignant neoplasm of base of tongue: Secondary | ICD-10-CM

## 2017-06-06 LAB — CBC WITH DIFFERENTIAL/PLATELET
Basophils Absolute: 0 10*3/uL (ref 0.0–0.1)
Basophils Relative: 0 %
Eosinophils Absolute: 0 10*3/uL (ref 0.0–0.5)
Eosinophils Relative: 0 %
HEMATOCRIT: 33.6 % — AB (ref 38.4–49.9)
HEMOGLOBIN: 11.3 g/dL — AB (ref 13.0–17.1)
LYMPHS ABS: 0.5 10*3/uL — AB (ref 0.9–3.3)
Lymphocytes Relative: 6 %
MCH: 32.5 pg (ref 27.2–33.4)
MCHC: 33.7 g/dL (ref 32.0–36.0)
MCV: 96.4 fL (ref 79.3–98.0)
MONO ABS: 0.5 10*3/uL (ref 0.1–0.9)
MONOS PCT: 5 %
NEUTROS ABS: 8 10*3/uL — AB (ref 1.5–6.5)
NEUTROS PCT: 89 %
Platelets: 126 10*3/uL — ABNORMAL LOW (ref 140–400)
RBC: 3.48 MIL/uL — ABNORMAL LOW (ref 4.20–5.82)
RDW: 15.4 % — AB (ref 11.0–14.6)
WBC: 9 10*3/uL (ref 4.0–10.3)

## 2017-06-06 LAB — COMPREHENSIVE METABOLIC PANEL
ALT: 15 U/L (ref 0–55)
AST: 16 U/L (ref 5–34)
Albumin: 3.6 g/dL (ref 3.5–5.0)
Alkaline Phosphatase: 158 U/L — ABNORMAL HIGH (ref 40–150)
Anion gap: 10 (ref 3–11)
BILIRUBIN TOTAL: 0.4 mg/dL (ref 0.2–1.2)
BUN: 10 mg/dL (ref 7–26)
CO2: 24 mmol/L (ref 22–29)
Calcium: 9.1 mg/dL (ref 8.4–10.4)
Chloride: 107 mmol/L (ref 98–109)
Creatinine, Ser: 0.84 mg/dL (ref 0.70–1.30)
GFR calc Af Amer: >60 mL/min (ref >60)
Glucose, Bld: 199 mg/dL — ABNORMAL HIGH (ref 70–140)
Potassium: 3.4 mmol/L — ABNORMAL LOW (ref 3.5–5.1)
Sodium: 141 mmol/L (ref 136–145)
TOTAL PROTEIN: 6.2 g/dL — AB (ref 6.4–8.3)

## 2017-06-06 MED ORDER — PALONOSETRON HCL INJECTION 0.25 MG/5ML
0.2500 mg | Freq: Once | INTRAVENOUS | Status: AC
  Administered 2017-06-06: 0.25 mg via INTRAVENOUS

## 2017-06-06 MED ORDER — SODIUM CHLORIDE 0.9% FLUSH
10.0000 mL | INTRAVENOUS | Status: DC | PRN
  Administered 2017-06-06: 10 mL
  Filled 2017-06-06: qty 10

## 2017-06-06 MED ORDER — SODIUM CHLORIDE 0.9 % IV SOLN
Freq: Once | INTRAVENOUS | Status: AC
  Administered 2017-06-06: 09:00:00 via INTRAVENOUS

## 2017-06-06 MED ORDER — DIPHENHYDRAMINE HCL 50 MG/ML IJ SOLN
INTRAMUSCULAR | Status: AC
  Filled 2017-06-06: qty 1

## 2017-06-06 MED ORDER — FAMOTIDINE IN NACL 20-0.9 MG/50ML-% IV SOLN
20.0000 mg | Freq: Once | INTRAVENOUS | Status: AC
  Administered 2017-06-06: 20 mg via INTRAVENOUS

## 2017-06-06 MED ORDER — SODIUM CHLORIDE 0.9 % IV SOLN
36.0000 mg/m2 | Freq: Once | INTRAVENOUS | Status: AC
  Administered 2017-06-06: 72 mg via INTRAVENOUS
  Filled 2017-06-06: qty 12

## 2017-06-06 MED ORDER — PALONOSETRON HCL INJECTION 0.25 MG/5ML
INTRAVENOUS | Status: AC
  Filled 2017-06-06: qty 5

## 2017-06-06 MED ORDER — DIPHENHYDRAMINE HCL 50 MG/ML IJ SOLN
50.0000 mg | Freq: Once | INTRAMUSCULAR | Status: AC
  Administered 2017-06-06: 50 mg via INTRAVENOUS

## 2017-06-06 MED ORDER — SODIUM CHLORIDE 0.9 % IV SOLN
159.0400 mg | Freq: Once | INTRAVENOUS | Status: AC
  Administered 2017-06-06: 160 mg via INTRAVENOUS
  Filled 2017-06-06: qty 16

## 2017-06-06 MED ORDER — SODIUM CHLORIDE 0.9% FLUSH
10.0000 mL | Freq: Once | INTRAVENOUS | Status: AC
  Administered 2017-06-06: 10 mL
  Filled 2017-06-06: qty 10

## 2017-06-06 MED ORDER — SODIUM CHLORIDE 0.9 % IV SOLN
20.0000 mg | Freq: Once | INTRAVENOUS | Status: AC
  Administered 2017-06-06: 20 mg via INTRAVENOUS
  Filled 2017-06-06: qty 2

## 2017-06-06 MED ORDER — HEPARIN SOD (PORK) LOCK FLUSH 100 UNIT/ML IV SOLN
500.0000 [IU] | Freq: Once | INTRAVENOUS | Status: AC | PRN
  Administered 2017-06-06: 500 [IU]
  Filled 2017-06-06: qty 5

## 2017-06-06 MED ORDER — FAMOTIDINE IN NACL 20-0.9 MG/50ML-% IV SOLN
INTRAVENOUS | Status: AC
Start: 2017-06-06 — End: 2017-06-06
  Filled 2017-06-06: qty 50

## 2017-06-06 NOTE — Patient Instructions (Signed)
[facility] Discharge Instructions for Patients Receiving Chemotherapy  Today you received the following chemotherapy agents taxol/carboplatin  To help prevent nausea and vomiting after your treatment, we encourage you to take your nausea medication as directed   If you develop nausea and vomiting that is not controlled by your nausea medication, call the clinic.   BELOW ARE SYMPTOMS THAT SHOULD BE REPORTED IMMEDIATELY:  *FEVER GREATER THAN 100.5 F  *CHILLS WITH OR WITHOUT FEVER  NAUSEA AND VOMITING THAT IS NOT CONTROLLED WITH YOUR NAUSEA MEDICATION  *UNUSUAL SHORTNESS OF BREATH  *UNUSUAL BRUISING OR BLEEDING  TENDERNESS IN MOUTH AND THROAT WITH OR WITHOUT PRESENCE OF ULCERS  *URINARY PROBLEMS  *BOWEL PROBLEMS  UNUSUAL RASH Items with * indicate a potential emergency and should be followed up as soon as possible.  Feel free to call the clinic you have any questions or concerns. The clinic phone number is [phone].  

## 2017-06-08 ENCOUNTER — Inpatient Hospital Stay: Payer: Medicare Other | Attending: Hematology and Oncology

## 2017-06-08 DIAGNOSIS — Z5189 Encounter for other specified aftercare: Secondary | ICD-10-CM | POA: Diagnosis not present

## 2017-06-08 DIAGNOSIS — Z5111 Encounter for antineoplastic chemotherapy: Secondary | ICD-10-CM | POA: Insufficient documentation

## 2017-06-08 DIAGNOSIS — C01 Malignant neoplasm of base of tongue: Secondary | ICD-10-CM | POA: Insufficient documentation

## 2017-06-08 DIAGNOSIS — C778 Secondary and unspecified malignant neoplasm of lymph nodes of multiple regions: Secondary | ICD-10-CM | POA: Diagnosis not present

## 2017-06-08 DIAGNOSIS — R110 Nausea: Secondary | ICD-10-CM

## 2017-06-08 DIAGNOSIS — Z79899 Other long term (current) drug therapy: Secondary | ICD-10-CM | POA: Diagnosis not present

## 2017-06-08 DIAGNOSIS — C787 Secondary malignant neoplasm of liver and intrahepatic bile duct: Secondary | ICD-10-CM | POA: Insufficient documentation

## 2017-06-08 MED ORDER — PEGFILGRASTIM-CBQV 6 MG/0.6ML ~~LOC~~ SOSY
PREFILLED_SYRINGE | SUBCUTANEOUS | Status: AC
  Filled 2017-06-08: qty 0.6

## 2017-06-08 MED ORDER — PEGFILGRASTIM-CBQV 6 MG/0.6ML ~~LOC~~ SOSY
6.0000 mg | PREFILLED_SYRINGE | Freq: Once | SUBCUTANEOUS | Status: AC
  Administered 2017-06-08: 6 mg via SUBCUTANEOUS

## 2017-06-20 ENCOUNTER — Inpatient Hospital Stay: Payer: Medicare Other

## 2017-06-20 DIAGNOSIS — C029 Malignant neoplasm of tongue, unspecified: Secondary | ICD-10-CM

## 2017-06-20 DIAGNOSIS — C01 Malignant neoplasm of base of tongue: Secondary | ICD-10-CM

## 2017-06-20 DIAGNOSIS — C770 Secondary and unspecified malignant neoplasm of lymph nodes of head, face and neck: Secondary | ICD-10-CM

## 2017-06-20 DIAGNOSIS — C787 Secondary malignant neoplasm of liver and intrahepatic bile duct: Secondary | ICD-10-CM

## 2017-06-20 DIAGNOSIS — Z5111 Encounter for antineoplastic chemotherapy: Secondary | ICD-10-CM | POA: Diagnosis not present

## 2017-06-20 LAB — COMPREHENSIVE METABOLIC PANEL
ALT: 19 U/L (ref 0–55)
AST: 22 U/L (ref 5–34)
Albumin: 3.8 g/dL (ref 3.5–5.0)
Alkaline Phosphatase: 140 U/L (ref 40–150)
Anion gap: 9 (ref 3–11)
BUN: 12 mg/dL (ref 7–26)
CHLORIDE: 108 mmol/L (ref 98–109)
CO2: 25 mmol/L (ref 22–29)
Calcium: 9.5 mg/dL (ref 8.4–10.4)
Creatinine, Ser: 0.81 mg/dL (ref 0.70–1.30)
GFR calc Af Amer: >60 mL/min (ref >60)
GLUCOSE: 85 mg/dL (ref 70–140)
Potassium: 3.5 mmol/L (ref 3.5–5.1)
Sodium: 142 mmol/L (ref 136–145)
TOTAL PROTEIN: 6.6 g/dL (ref 6.4–8.3)
Total Bilirubin: 0.3 mg/dL (ref 0.2–1.2)

## 2017-06-20 LAB — CBC WITH DIFFERENTIAL/PLATELET
Basophils Absolute: 0 10*3/uL (ref 0.0–0.1)
Basophils Relative: 0 %
EOS ABS: 0 10*3/uL (ref 0.0–0.5)
EOS PCT: 0 %
HCT: 35.3 % — ABNORMAL LOW (ref 38.4–49.9)
Hemoglobin: 11.6 g/dL — ABNORMAL LOW (ref 13.0–17.1)
LYMPHS ABS: 0.7 10*3/uL — AB (ref 0.9–3.3)
Lymphocytes Relative: 6 %
MCH: 32.1 pg (ref 27.2–33.4)
MCHC: 32.9 g/dL (ref 32.0–36.0)
MCV: 97.8 fL (ref 79.3–98.0)
MONO ABS: 0.5 10*3/uL (ref 0.1–0.9)
Monocytes Relative: 5 %
Neutro Abs: 9.5 10*3/uL — ABNORMAL HIGH (ref 1.5–6.5)
Neutrophils Relative %: 89 %
PLATELETS: 118 10*3/uL — AB (ref 140–400)
RBC: 3.61 MIL/uL — AB (ref 4.20–5.82)
RDW: 14.4 % (ref 11.0–14.6)
WBC: 10.7 10*3/uL — AB (ref 4.0–10.3)

## 2017-06-20 MED ORDER — DIPHENHYDRAMINE HCL 50 MG/ML IJ SOLN
INTRAMUSCULAR | Status: AC
  Filled 2017-06-20: qty 1

## 2017-06-20 MED ORDER — SODIUM CHLORIDE 0.9 % IV SOLN
36.0000 mg/m2 | Freq: Once | INTRAVENOUS | Status: AC
  Administered 2017-06-20: 72 mg via INTRAVENOUS
  Filled 2017-06-20: qty 12

## 2017-06-20 MED ORDER — PALONOSETRON HCL INJECTION 0.25 MG/5ML
INTRAVENOUS | Status: AC
Start: 2017-06-20 — End: ?
  Filled 2017-06-20: qty 5

## 2017-06-20 MED ORDER — SODIUM CHLORIDE 0.9 % IV SOLN
159.0400 mg | Freq: Once | INTRAVENOUS | Status: AC
  Administered 2017-06-20: 160 mg via INTRAVENOUS
  Filled 2017-06-20: qty 16

## 2017-06-20 MED ORDER — FAMOTIDINE IN NACL 20-0.9 MG/50ML-% IV SOLN
20.0000 mg | Freq: Once | INTRAVENOUS | Status: AC
  Administered 2017-06-20: 20 mg via INTRAVENOUS

## 2017-06-20 MED ORDER — PALONOSETRON HCL INJECTION 0.25 MG/5ML
0.2500 mg | Freq: Once | INTRAVENOUS | Status: AC
  Administered 2017-06-20: 0.25 mg via INTRAVENOUS

## 2017-06-20 MED ORDER — SODIUM CHLORIDE 0.9 % IV SOLN
20.0000 mg | Freq: Once | INTRAVENOUS | Status: AC
  Administered 2017-06-20: 20 mg via INTRAVENOUS
  Filled 2017-06-20: qty 2

## 2017-06-20 MED ORDER — FAMOTIDINE IN NACL 20-0.9 MG/50ML-% IV SOLN
INTRAVENOUS | Status: AC
  Filled 2017-06-20: qty 50

## 2017-06-20 MED ORDER — SODIUM CHLORIDE 0.9 % IV SOLN
Freq: Once | INTRAVENOUS | Status: AC
  Administered 2017-06-20: 08:00:00 via INTRAVENOUS

## 2017-06-20 MED ORDER — HEPARIN SOD (PORK) LOCK FLUSH 100 UNIT/ML IV SOLN
500.0000 [IU] | Freq: Once | INTRAVENOUS | Status: AC | PRN
  Administered 2017-06-20: 500 [IU]
  Filled 2017-06-20: qty 5

## 2017-06-20 MED ORDER — DIPHENHYDRAMINE HCL 50 MG/ML IJ SOLN
50.0000 mg | Freq: Once | INTRAMUSCULAR | Status: AC
  Administered 2017-06-20: 50 mg via INTRAVENOUS

## 2017-06-20 MED ORDER — SODIUM CHLORIDE 0.9% FLUSH
10.0000 mL | INTRAVENOUS | Status: DC | PRN
Start: 2017-06-20 — End: 2017-06-20
  Administered 2017-06-20: 10 mL
  Filled 2017-06-20: qty 10

## 2017-06-20 NOTE — Patient Instructions (Signed)
[facility] Discharge Instructions for Patients Receiving Chemotherapy  Today you received the following chemotherapy agents taxol/carboplatin  To help prevent nausea and vomiting after your treatment, we encourage you to take your nausea medication as directed   If you develop nausea and vomiting that is not controlled by your nausea medication, call the clinic.   BELOW ARE SYMPTOMS THAT SHOULD BE REPORTED IMMEDIATELY:  *FEVER GREATER THAN 100.5 F  *CHILLS WITH OR WITHOUT FEVER  NAUSEA AND VOMITING THAT IS NOT CONTROLLED WITH YOUR NAUSEA MEDICATION  *UNUSUAL SHORTNESS OF BREATH  *UNUSUAL BRUISING OR BLEEDING  TENDERNESS IN MOUTH AND THROAT WITH OR WITHOUT PRESENCE OF ULCERS  *URINARY PROBLEMS  *BOWEL PROBLEMS  UNUSUAL RASH Items with * indicate a potential emergency and should be followed up as soon as possible.  Feel free to call the clinic you have any questions or concerns. The clinic phone number is [phone].  

## 2017-06-22 ENCOUNTER — Inpatient Hospital Stay: Payer: Medicare Other

## 2017-06-22 ENCOUNTER — Telehealth: Payer: Self-pay | Admitting: Hematology and Oncology

## 2017-06-22 NOTE — Telephone Encounter (Signed)
Spoke to patients wife regarding upcoming may appointment updates per 5/13 sch message

## 2017-06-24 ENCOUNTER — Ambulatory Visit (HOSPITAL_COMMUNITY)
Admission: RE | Admit: 2017-06-24 | Discharge: 2017-06-24 | Disposition: A | Discharge status: Home / Self Care | Payer: Medicare Other | Source: Ambulatory Visit | Attending: Hematology and Oncology | Admitting: Hematology and Oncology

## 2017-06-24 DIAGNOSIS — I2510 Atherosclerotic heart disease of native coronary artery without angina pectoris: Secondary | ICD-10-CM | POA: Insufficient documentation

## 2017-06-24 DIAGNOSIS — K8020 Calculus of gallbladder without cholecystitis without obstruction: Secondary | ICD-10-CM | POA: Diagnosis not present

## 2017-06-24 DIAGNOSIS — Z7902 Long term (current) use of antithrombotics/antiplatelets: Secondary | ICD-10-CM | POA: Diagnosis not present

## 2017-06-24 DIAGNOSIS — I700 Atherosclerosis of aorta: Secondary | ICD-10-CM | POA: Insufficient documentation

## 2017-06-24 DIAGNOSIS — Z79899 Other long term (current) drug therapy: Secondary | ICD-10-CM | POA: Diagnosis not present

## 2017-06-24 DIAGNOSIS — C787 Secondary malignant neoplasm of liver and intrahepatic bile duct: Secondary | ICD-10-CM | POA: Diagnosis present

## 2017-06-24 DIAGNOSIS — C772 Secondary and unspecified malignant neoplasm of intra-abdominal lymph nodes: Secondary | ICD-10-CM | POA: Diagnosis not present

## 2017-06-24 LAB — GLUCOSE, CAPILLARY: Glucose-Capillary: 97 mg/dL (ref 65–99)

## 2017-06-24 MED ORDER — FLUDEOXYGLUCOSE F - 18 (FDG) INJECTION
8.0900 | Freq: Once | INTRAVENOUS | Status: AC | PRN
  Administered 2017-06-24: 8.09 via INTRAVENOUS

## 2017-06-27 ENCOUNTER — Ambulatory Visit (HOSPITAL_COMMUNITY): Payer: Medicare Other

## 2017-06-28 ENCOUNTER — Inpatient Hospital Stay (HOSPITAL_BASED_OUTPATIENT_CLINIC_OR_DEPARTMENT_OTHER): Payer: Medicare Other | Admitting: Hematology and Oncology

## 2017-06-28 ENCOUNTER — Telehealth: Payer: Self-pay | Admitting: Hematology and Oncology

## 2017-06-28 ENCOUNTER — Encounter: Payer: Self-pay | Admitting: Hematology and Oncology

## 2017-06-28 VITALS — BP 130/53 | HR 80 | Temp 98.1°F | Resp 18 | Ht 68.0 in | Wt 165.1 lb

## 2017-06-28 DIAGNOSIS — C787 Secondary malignant neoplasm of liver and intrahepatic bile duct: Secondary | ICD-10-CM | POA: Diagnosis not present

## 2017-06-28 DIAGNOSIS — Z5111 Encounter for antineoplastic chemotherapy: Secondary | ICD-10-CM | POA: Diagnosis not present

## 2017-06-28 DIAGNOSIS — C029 Malignant neoplasm of tongue, unspecified: Secondary | ICD-10-CM

## 2017-06-28 DIAGNOSIS — C772 Secondary and unspecified malignant neoplasm of intra-abdominal lymph nodes: Secondary | ICD-10-CM

## 2017-06-28 DIAGNOSIS — Z7189 Other specified counseling: Secondary | ICD-10-CM

## 2017-06-28 DIAGNOSIS — C01 Malignant neoplasm of base of tongue: Secondary | ICD-10-CM

## 2017-06-28 DIAGNOSIS — C778 Secondary and unspecified malignant neoplasm of lymph nodes of multiple regions: Secondary | ICD-10-CM

## 2017-06-28 NOTE — Progress Notes (Signed)
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, repeat imaging study showed disease progression He is not symptomatic He desire further therapy We reviewed the current guidelines He is interested to pursue pembrolizumab The risks, benefits, side effects of treatment were discussed with the patient and he agreed to proceed I recommend minimum 3 months of treatment before repeat imaging study to assess response to therapy We discussed tertiary center referral but the patient declined  We discussed blood test only clinical trial but the patient declined  Goals of care, counseling/discussion 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 We discussed importance of Advanced Directives and Living will. We discussed CODE STATUS; the patient desires to remain in full code.   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 TSH    Standing Status:   Standing    Number of Occurrences:   11    Standing Expiration Date:   06/29/2018    INTERVAL HISTORY: Please see below for problem oriented charting. He returns with his wife for further follow-up He denies abdominal pain His appetite is stable  without recent weight change His blood sugar was mildly elevated but he does not take insulin on a regular basis He denies recent infection  SUMMARY OF ONCOLOGIC HISTORY:   Tongue cancer ([place])   08/20/2015 Miscellaneous    He was evaluated by ENT      08/22/2015 Imaging    CT neck showed malignant adenopathy right supraclavicular region compatible with metastatic disease. Biopsy recommended. No definite pharyngeal mass lesion identified by CT.       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 No other primary or metastatic cancer. 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         11/13/2015 Procedure    Placement of 20 French pull-through percutaneous gastrostomy tube.      03/11/2016 PET scan    Resolution of hypermetabolic right cervical lymphadenopathy since prior study . No other hypermetabolic lymphadenopathy identified. New hypermetabolic lesion and caudate lobe of liver, highly suspicious for liver metastasis. Recommend liver protocol abdomen MRI without and with contrast for further evaluation. New patchy airspace disease in both lung apices  with FDG uptake. 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 Recommend continued attention on follow-up imaging.       03/19/2016 Imaging    MRI showed 3.4 cm heterogeneously enhancing mass in caudate lobe of liver, consistent liver metastasis. No other sites of metastatic disease identified  within the liver or abdomen      03/26/2016 Procedure    Ultrasound and fluoroscopically guided right internal jugular single lumen power port catheter insertion. Tip in the SVC/RA junction. 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 Metabolic response to therapy of caudate lobe liver metastasis. Low-level activity in this area is similar to the surrounding liver.  2. Hypermetabolism in the right inguinal crease without concurrent adenopathy. Especially given subtle surrounding edema, favor cellulitis. Consider physical exam correlation. 3. Lack of visualization of previously described 7 mm left lower lobe pulmonary nodule. 4. Diffuse marrow hypermetabolism, favoring stimulation by chemotherapy.      09/20/2016 PET scan    1. Reduced activity in the hypermetabolic caudate lobe lesion of the liver, likely a metastatic lesion. 2. Scattered likely inflammatory activity along the glenohumeral and acromioclavicular joints and in the hips. 3. Healing of bilateral upper rib fractures. 4. Other imaging findings of potential clinical significance: Aortic Atherosclerosis (ICD10-I70.0). Coronary and carotid atherosclerosis. Photopenic right renal cysts.      11/17/2016 Procedure    Successful removal of 20 French pull-through gastrostomy tube.      12/09/2016 PET scan    1. Slight increase in size and degree of uptake associated with caudate lobe lesion consistent with metastatic disease 2. Increase in size and FDG uptake associated with portacaval lymph node 3. Aortic  Atherosclerosis (ICD10-I70.0). Coronary atherosclerotic changes noted.      12/27/2016 -  Chemotherapy    He received carboplatin and taxol      01/19/2017 - 01/25/2017 Hospital Admission    He was admitted to the hospital with sepsis secondary to cholangitis, managed conservatively      01/21/2017 Imaging    1. Cholelithiasis with gallbladder distension and subtle pericholecystic edema. Findings suspicious for acute cholecystitis. Right upper quadrant ultrasound could confirm. 2. Similar caudate lobe metastasis. Similar borderline porta hepatis adenopathy. 3. Left nephrolithiasis. 4. New tiny right pleural effusion with bibasilar atelectasis. 5. Trace air in the nondependent urinary bladder. Correlate with instrumentation. 6. Coronary artery atherosclerosis. Aortic Atherosclerosis (ICD10-I70.0).        03/25/2017 PET scan    1. Reduced size and significantly reduced activity in the hepatic caudate lobe metastatic lesion. Significantly reduced activity of the adjacent porta hepatis lymph node. Appearance compatible with interval effective therapy. 2. Ground-glass and sub solid densities anteriorly in the right upper lobe appear to conform to several secondary pulmonary lobules and are new compared to the right our prior exam. Low-grade metabolic activity at 2.7. Probably alveolitis/low-grade pneumonia, the morphology would be atypical for metastatic disease, but surveillance is suggested. 3. There is some focal accentuated metabolic activity at  the sigmoid colon anastomotic site. No CT correlate. There is no abnormal activity in this vicinity on 12/09/2016 and I suspect that this is physiologic, but this area also warrant surveillance. 4. Other imaging findings of potential clinical significance: Aortic Atherosclerosis (ICD10-I70.0). Coronary atherosclerosis. Cholelithiasis potentially with minimal gallbladder wall thickening. Sigmoid colon diverticulosis. Prostatomegaly.      06/24/2017  PET scan    1. There has been interval increase in size and degree of FDG uptake associated with hypermetabolic metastasis to segment 1 of the liver. 2. Mild increase in size and moderate increase in FDG uptake associated with upper abdominal nodal metastasis. 3. Interval development of diffuse uptake throughout the axial and appendicular skeleton which is favored to represent treatment related changes. 4. Aortic atherosclerosis and 3 vessel coronary artery atherosclerotic calcifications. 5. Gallstones.      07/07/2017 -  Chemotherapy    The patient had pembrolizumab (KEYTRUDA) 200 mg in sodium chloride 0.9 % 50 mL chemo infusion, 200 mg, Intravenous, Once, 0 of 6 cycles  for chemotherapy treatment.        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([place])   03/22/2016 Initial Diagnosis    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([place])      07/07/2017 -  Chemotherapy    The patient had pembrolizumab (KEYTRUDA) 200 mg in sodium chloride 0.9 % 50 mL chemo infusion, 200 mg, Intravenous, Once, 0 of 6 cycles  for chemotherapy treatment.        REVIEW OF SYSTEMS:   Constitutional: Denies fevers, chills or abnormal weight loss Eyes: Denies blurriness of vision Ears, nose, mouth, throat, and face: Denies mucositis or sore throat Respiratory: Denies cough, dyspnea or wheezes Cardiovascular: Denies palpitation, chest discomfort or lower extremity swelling Gastrointestinal:  Denies nausea, heartburn or change in bowel habits Skin: Denies abnormal skin rashes Lymphatics: Denies new lymphadenopathy or easy bruising Neurological:Denies numbness, tingling or new  weaknesses Behavioral/Psych: Mood is stable, no new changes  All other systems were reviewed with the patient and are negative.  I have reviewed the past medical history, past surgical history, social history and family history with the patient and they are unchanged from previous note.  ALLERGIES:  is allergic to cetuximab.  MEDICATIONS:  Current Outpatient Medications  Medication Sig Dispense Refill  . clopidogrel (PLAVIX) 75 MG tablet TAKE 1 TABLET EVERY DAY 90 tablet 2  . insulin glargine (LANTUS) 100 UNIT/ML injection Inject 0.5 mLs (50 Units total) into the skin at bedtime. 10 mL   . lidocaine-prilocaine (EMLA) cream Apply 1 application topically as needed. 30 g 6  . mirtazapine (REMERON) 30 MG tablet Take 1 tablet (30 mg total) by mouth at bedtime. 90 tablet 11  . Multiple Vitamin (MULTIVITAMIN WITH MINERALS) TABS tablet Take 1 tablet by mouth daily. 30 tablet 0  . ondansetron (ZOFRAN) 8 MG tablet Take 1 tablet (8 mg total) by mouth every 8 (eight) hours as needed for nausea or vomiting. 60 tablet 11  . polyethylene glycol (MIRALAX / GLYCOLAX) packet Take 17 g by mouth daily as needed. 14 each  0   No current facility-administered medications for this visit.     PHYSICAL EXAMINATION: ECOG PERFORMANCE STATUS: 0 - Asymptomatic  Vitals:   06/28/17 1040  BP: (!) 130/53  Pulse: 80  Resp: 18  Temp: 98.1 F (36.7 C)  SpO2: 100%   Filed Weights   06/28/17 1040  Weight: 165 lb 1.6 oz (74.9 kg)    GENERAL:alert, no distress and comfortable SKIN: skin color, texture, turgor are normal, no rashes or significant lesions EYES: normal, Conjunctiva are pink and non-injected, sclera clear OROPHARYNX:no exudate, no erythema and lips, buccal mucosa, and tongue normal  NECK: supple, thyroid normal size, non-tender, without nodularity LYMPH:  no palpable lymphadenopathy in the cervical, axillary or inguinal LUNGS: clear to auscultation and percussion with normal breathing effort HEART:  regular rate & rhythm and no murmurs and no lower extremity edema ABDOMEN:abdomen soft, non-tender and normal bowel sounds Musculoskeletal:no cyanosis of digits and no clubbing  NEURO: alert & oriented x 3 with fluent speech, no focal motor/sensory deficits  LABORATORY DATA:  I have reviewed the data as listed    Component Value Date/Time   NA 142 06/20/2017 0805   NA 140 02/07/2017 0811   K 3.5 06/20/2017 0805   K 3.9 02/07/2017 0811   CL 108 06/20/2017 0805   CO2 25 06/20/2017 0805   CO2 25 02/07/2017 0811   GLUCOSE 85 06/20/2017 0805   GLUCOSE 118 02/07/2017 0811   BUN 12 06/20/2017 0805   BUN 17.8 02/07/2017 0811   CREATININE 0.81 06/20/2017 0805   CREATININE 1.0 02/07/2017 0811   CALCIUM 9.5 06/20/2017 0805   CALCIUM 9.4 02/07/2017 0811   PROT 6.6 06/20/2017 0805   PROT 6.7 02/07/2017 0811   ALBUMIN 3.8 06/20/2017 0805   ALBUMIN 3.2 (L) 02/07/2017 0811   AST 22 06/20/2017 0805   AST 16 02/07/2017 0811   ALT 19 06/20/2017 0805   ALT 17 02/07/2017 0811   ALKPHOS 140 06/20/2017 0805   ALKPHOS 144 02/07/2017 0811   BILITOT 0.3 06/20/2017 0805   BILITOT 0.70 02/07/2017 0811   GFRNONAA >60 06/20/2017 0805   GFRAA >60 06/20/2017 0805    No results found for: SPEP, UPEP  Lab Results  Component Value Date   WBC 10.7 (H) 06/20/2017   NEUTROABS 9.5 (H) 06/20/2017   HGB 11.6 (L) 06/20/2017   HCT 35.3 (L) 06/20/2017   MCV 97.8 06/20/2017   PLT 118 (L) 06/20/2017      Chemistry      Component Value Date/Time   NA 142 06/20/2017 0805   NA 140 02/07/2017 0811   K 3.5 06/20/2017 0805   K 3.9 02/07/2017 0811   CL 108 06/20/2017 0805   CO2 25 06/20/2017 0805   CO2 25 02/07/2017 0811   BUN 12 06/20/2017 0805   BUN 17.8 02/07/2017 0811   CREATININE 0.81 06/20/2017 0805   CREATININE 1.0 02/07/2017 0811   GLU 78 11/13/2015 1134      Component Value Date/Time   CALCIUM 9.5 06/20/2017 0805   CALCIUM 9.4 02/07/2017 0811   ALKPHOS 140 06/20/2017 0805   ALKPHOS 144  02/07/2017 0811   AST 22 06/20/2017 0805   AST 16 02/07/2017 0811   ALT 19 06/20/2017 0805   ALT 17 02/07/2017 0811   BILITOT 0.3 06/20/2017 0805   BILITOT 0.70 02/07/2017 0811       RADIOGRAPHIC STUDIES: I have personally reviewed the radiological images as listed and agreed with the findings in the report. Nm Pet  Image Restag (ps) Skull Base To Thigh  Result Date: 06/24/2017 CLINICAL DATA:  Subsequent treatment strategy for metastatic head and neck cancer. EXAM: NUCLEAR MEDICINE PET SKULL BASE TO THIGH TECHNIQUE: 8.0 mCi F-18 FDG was injected intravenously. Full-ring PET imaging was performed from the skull base to thigh after the radiotracer. CT data was obtained and used for attenuation correction and anatomic localization. Fasting blood glucose: 97 mg/dl COMPARISON:  03/25/2017 FINDINGS: Mediastinal blood pool activity: SUV max 1.95 NECK: No hypermetabolic lymph nodes in the neck. Incidental CT findings: none CHEST: No hypermetabolic mediastinal or hilar nodes. No suspicious pulmonary nodules on the CT scan. Resolution of previous anterior right upper lobe subpleural ground-glass densities compatible with inflammatory or infectious process. No suspicious hypermetabolic pulmonary nodule or mass. Incidental CT findings: Aortic atherosclerosis. Three vessel coronary artery atherosclerotic calcifications identified. ABDOMEN/PELVIS: Tumor within the segment 1 of the liver measures 4.4 cm and has an SUV max of 9.13. Previously this was measured at 3 cm within SUV max of 4.08. No abnormal activity within the spleen or pancreas. The adrenal glands are unremarkable. The index portacaval lymph node measures 1.7 cm and has an SUV max of 7.98. Previously 1.4 cm with an SUV max of 3.19. No hypermetabolic pelvic or inguinal lymph nodes. Incidental CT findings: Small stones layering within the gallbladder. Aortic atherosclerosis. No aneurysm. Right kidney cyst is again identified. SKELETON: Interval development  of diffuse uptake throughout the axial and proximal appendicular skeleton which is favored to represent treatment related changes. And Incidental CT findings: Chronic healed fracture deformity involves the mid body of sternum. IMPRESSION: 1. There has been interval increase in size and degree of FDG uptake associated with hypermetabolic metastasis to segment 1 of the liver. 2. Mild increase in size and moderate increase in FDG uptake associated with upper abdominal nodal metastasis. 3. Interval development of diffuse uptake throughout the axial and appendicular skeleton which is favored to represent treatment related changes. 4. Aortic atherosclerosis and 3 vessel coronary artery atherosclerotic calcifications. 5. Gallstones. Electronically Signed   By: Kerby Moors M.D.   On: 06/24/2017 14:40    All questions were answered. The patient knows to call the clinic with any problems, questions or concerns. No barriers to learning was detected.  I spent 25 minutes counseling the patient face to face. The total time spent in the appointment was 40 minutes and more than 50% was on counseling and review of test results  Heath Lark, MD 06/28/2017 2:11 PM

## 2017-06-28 NOTE — Assessment & Plan Note (Signed)
Unfortunately, repeat imaging study showed disease progression He is not symptomatic He desire further therapy We reviewed the current guidelines He is interested to pursue pembrolizumab The risks, benefits, side effects of treatment were discussed with the patient and he agreed to proceed I recommend minimum 3 months of treatment before repeat imaging study to assess response to therapy We discussed tertiary center referral but the patient declined  We discussed blood test only clinical trial but the patient declined

## 2017-06-28 NOTE — Progress Notes (Signed)
DISCONTINUE OFF PATHWAY REGIMEN - Head and Neck   [id number]:Carboplatin + Paclitaxel (5/175) q21 Days:   A cycle is every 21 days:     Paclitaxel      Carboplatin   **Always confirm dose/schedule in your pharmacy ordering system**    REASON: Disease Progression PRIOR TREATMENT: Off Pathway: Carboplatin + Paclitaxel (5/175) q21 Days TREATMENT RESPONSE: Progressive Disease (PD)  START OFF PATHWAY REGIMEN - Head and Neck   [id number]:Pembrolizumab 200 mg q21 Days:   A cycle is 21 days:     Pembrolizumab   **Always confirm dose/schedule in your pharmacy ordering system**    Patient Characteristics: Oropharynx, HPV Positive, Metastatic, Third Line Disease Classification: Oropharynx HPV Status: Positive (+) AJCC N Category: cN2 AJCC 8 Stage Grouping: IV Current Disease Status: Metastatic Disease AJCC T Category: T1 AJCC M Category: M1 Line of therapy: Third Line  Intent of Therapy: Non-Curative / Palliative Intent, Discussed with Patient

## 2017-06-28 NOTE — Assessment & Plan Note (Signed)
The adjacent lymph node next to the liver is mildly enlarged but he is not symptomatic Observe only

## 2017-06-28 NOTE — Telephone Encounter (Signed)
Gave avs and calendar ° °

## 2017-06-28 NOTE — Assessment & Plan Note (Signed)
The liver mass is enlarging We will continue treatment indefinitely His liver function tests are within normal limits 

## 2017-06-28 NOTE — Assessment & Plan Note (Signed)
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 We discussed importance of Advanced Directives and Living will. We discussed CODE STATUS; the patient desires to remain in full code.

## 2017-06-30 ENCOUNTER — Other Ambulatory Visit: Payer: Self-pay | Admitting: Hematology and Oncology

## 2017-07-07 ENCOUNTER — Other Ambulatory Visit: Payer: Self-pay | Admitting: Hematology and Oncology

## 2017-07-08 ENCOUNTER — Inpatient Hospital Stay: Payer: Medicare Other

## 2017-07-08 ENCOUNTER — Other Ambulatory Visit: Payer: Self-pay

## 2017-07-08 ENCOUNTER — Telehealth: Payer: Self-pay

## 2017-07-08 VITALS — BP 143/71 | HR 74 | Temp 98.3°F | Resp 16 | Ht 68.0 in | Wt 163.5 lb

## 2017-07-08 DIAGNOSIS — Z5111 Encounter for antineoplastic chemotherapy: Secondary | ICD-10-CM

## 2017-07-08 DIAGNOSIS — C770 Secondary and unspecified malignant neoplasm of lymph nodes of head, face and neck: Secondary | ICD-10-CM

## 2017-07-08 DIAGNOSIS — C029 Malignant neoplasm of tongue, unspecified: Secondary | ICD-10-CM

## 2017-07-08 DIAGNOSIS — C01 Malignant neoplasm of base of tongue: Secondary | ICD-10-CM

## 2017-07-08 DIAGNOSIS — C787 Secondary malignant neoplasm of liver and intrahepatic bile duct: Secondary | ICD-10-CM

## 2017-07-08 LAB — CBC WITH DIFFERENTIAL/PLATELET
BASOS ABS: 0 10*3/uL (ref 0.0–0.1)
Basophils Relative: 1 %
EOS PCT: 1 %
Eosinophils Absolute: 0 10*3/uL (ref 0.0–0.5)
HCT: 34.1 % — ABNORMAL LOW (ref 38.4–49.9)
Hemoglobin: 11.4 g/dL — ABNORMAL LOW (ref 13.0–17.1)
LYMPHS PCT: 12 %
Lymphs Abs: 0.3 10*3/uL — ABNORMAL LOW (ref 0.9–3.3)
MCH: 32 pg (ref 27.2–33.4)
MCHC: 33.6 g/dL (ref 32.0–36.0)
MCV: 95.4 fL (ref 79.3–98.0)
MONO ABS: 0.3 10*3/uL (ref 0.1–0.9)
Monocytes Relative: 10 %
Neutro Abs: 2.2 10*3/uL (ref 1.5–6.5)
Neutrophils Relative %: 76 %
PLATELETS: 115 10*3/uL — AB (ref 140–400)
RBC: 3.57 MIL/uL — AB (ref 4.20–5.82)
RDW: 14.3 % (ref 11.0–14.6)
WBC: 2.9 10*3/uL — AB (ref 4.0–10.3)

## 2017-07-08 LAB — COMPREHENSIVE METABOLIC PANEL
ALBUMIN: 3.9 g/dL (ref 3.5–5.0)
ALK PHOS: 87 U/L (ref 40–150)
ALT: 13 U/L (ref 0–55)
AST: 16 U/L (ref 5–34)
Anion gap: 10 (ref 3–11)
BILIRUBIN TOTAL: 0.5 mg/dL (ref 0.2–1.2)
BUN: 11 mg/dL (ref 7–26)
CALCIUM: 9.3 mg/dL (ref 8.4–10.4)
CO2: 25 mmol/L (ref 22–29)
Chloride: 106 mmol/L (ref 98–109)
Creatinine, Ser: 0.79 mg/dL (ref 0.70–1.30)
GFR calc Af Amer: >60 mL/min (ref >60)
GLUCOSE: 163 mg/dL — AB (ref 70–140)
POTASSIUM: 3.7 mmol/L (ref 3.5–5.1)
Sodium: 141 mmol/L (ref 136–145)
TOTAL PROTEIN: 6.4 g/dL (ref 6.4–8.3)

## 2017-07-08 LAB — TSH: TSH: 6.642 u[IU]/mL — ABNORMAL HIGH (ref 0.320–4.118)

## 2017-07-08 MED ORDER — LEVOTHYROXINE SODIUM 25 MCG PO TABS: 25.0000 ug | ORAL_TABLET | Freq: Every day | ORAL | 0 refills | Status: DC

## 2017-07-08 MED ORDER — HEPARIN SOD (PORK) LOCK FLUSH 100 UNIT/ML IV SOLN
500.0000 [IU] | Freq: Once | INTRAVENOUS | Status: AC | PRN
  Administered 2017-07-08: 500 [IU]
  Filled 2017-07-08: qty 5

## 2017-07-08 MED ORDER — SODIUM CHLORIDE 0.9 % IV SOLN
Freq: Once | INTRAVENOUS | Status: AC
  Administered 2017-07-08: 08:00:00 via INTRAVENOUS

## 2017-07-08 MED ORDER — SODIUM CHLORIDE 0.9 % IV SOLN
200.0000 mg | Freq: Once | INTRAVENOUS | Status: AC
  Administered 2017-07-08: 200 mg via INTRAVENOUS
  Filled 2017-07-08: qty 8

## 2017-07-08 MED ORDER — SODIUM CHLORIDE 0.9% FLUSH
10.0000 mL | INTRAVENOUS | Status: DC | PRN
  Administered 2017-07-08: 10 mL
  Filled 2017-07-08: qty 10

## 2017-07-08 NOTE — Patient Instructions (Signed)
[facility] Discharge Instructions for Patients Receiving Chemotherapy  Today you received the following chemotherapy agents keytruda   To help prevent nausea and vomiting after your treatment, we encourage you to take your nausea medication as directed  If you develop nausea and vomiting that is not controlled by your nausea medication, call the clinic.   BELOW ARE SYMPTOMS THAT SHOULD BE REPORTED IMMEDIATELY:  *FEVER GREATER THAN 100.5 F  *CHILLS WITH OR WITHOUT FEVER  NAUSEA AND VOMITING THAT IS NOT CONTROLLED WITH YOUR NAUSEA MEDICATION  *UNUSUAL SHORTNESS OF BREATH  *UNUSUAL BRUISING OR BLEEDING  TENDERNESS IN MOUTH AND THROAT WITH OR WITHOUT PRESENCE OF ULCERS  *URINARY PROBLEMS  *BOWEL PROBLEMS  UNUSUAL RASH Items with * indicate a potential emergency and should be followed up as soon as possible.  Feel free to call the clinic you have any questions or concerns. The clinic phone number is [phone].  

## 2017-07-08 NOTE — Telephone Encounter (Signed)
-----   Message from Heath Lark, MD sent at 07/08/2017 10:05 AM EDT ----- Regarding: high TSH Let him know the high TSH is due to thyroid not working well from prior radiation I recommend levothyroxine 25 mcg po daily, disp 30 no refill Please send to local pharmacy of his choice ----- Message ----- From: [place]: 07/08/2017   8:15 AM To: Heath Lark, MD

## 2017-07-08 NOTE — Telephone Encounter (Signed)
Called with below message. Verbalized understanding. Rx sent to pharmacy.

## 2017-07-29 ENCOUNTER — Inpatient Hospital Stay: Payer: Medicare Other

## 2017-07-29 ENCOUNTER — Telehealth: Payer: Self-pay | Admitting: Hematology and Oncology

## 2017-07-29 ENCOUNTER — Inpatient Hospital Stay (HOSPITAL_BASED_OUTPATIENT_CLINIC_OR_DEPARTMENT_OTHER): Payer: Medicare Other | Admitting: Hematology and Oncology

## 2017-07-29 ENCOUNTER — Encounter: Payer: Self-pay | Admitting: Hematology and Oncology

## 2017-07-29 ENCOUNTER — Telehealth: Payer: Self-pay

## 2017-07-29 ENCOUNTER — Inpatient Hospital Stay: Payer: Medicare Other | Attending: Hematology and Oncology

## 2017-07-29 DIAGNOSIS — C778 Secondary and unspecified malignant neoplasm of lymph nodes of multiple regions: Secondary | ICD-10-CM | POA: Diagnosis not present

## 2017-07-29 DIAGNOSIS — C029 Malignant neoplasm of tongue, unspecified: Secondary | ICD-10-CM

## 2017-07-29 DIAGNOSIS — E039 Hypothyroidism, unspecified: Secondary | ICD-10-CM | POA: Insufficient documentation

## 2017-07-29 DIAGNOSIS — Z5112 Encounter for antineoplastic immunotherapy: Secondary | ICD-10-CM | POA: Diagnosis not present

## 2017-07-29 DIAGNOSIS — Z79899 Other long term (current) drug therapy: Secondary | ICD-10-CM | POA: Insufficient documentation

## 2017-07-29 DIAGNOSIS — R110 Nausea: Secondary | ICD-10-CM

## 2017-07-29 DIAGNOSIS — C787 Secondary malignant neoplasm of liver and intrahepatic bile duct: Secondary | ICD-10-CM | POA: Diagnosis not present

## 2017-07-29 DIAGNOSIS — Z9861 Coronary angioplasty status: Secondary | ICD-10-CM | POA: Insufficient documentation

## 2017-07-29 DIAGNOSIS — R197 Diarrhea, unspecified: Secondary | ICD-10-CM | POA: Insufficient documentation

## 2017-07-29 DIAGNOSIS — C01 Malignant neoplasm of base of tongue: Secondary | ICD-10-CM

## 2017-07-29 DIAGNOSIS — I2510 Atherosclerotic heart disease of native coronary artery without angina pectoris: Secondary | ICD-10-CM

## 2017-07-29 DIAGNOSIS — E876 Hypokalemia: Secondary | ICD-10-CM | POA: Insufficient documentation

## 2017-07-29 DIAGNOSIS — C770 Secondary and unspecified malignant neoplasm of lymph nodes of head, face and neck: Secondary | ICD-10-CM

## 2017-07-29 DIAGNOSIS — Z5111 Encounter for antineoplastic chemotherapy: Secondary | ICD-10-CM

## 2017-07-29 LAB — COMPREHENSIVE METABOLIC PANEL
ALBUMIN: 4.1 g/dL (ref 3.5–5.0)
ALT: 8 U/L (ref 0–55)
AST: 15 U/L (ref 5–34)
Alkaline Phosphatase: 96 U/L (ref 40–150)
Anion gap: 9 (ref 3–11)
BUN: 9 mg/dL (ref 7–26)
CALCIUM: 9.8 mg/dL (ref 8.4–10.4)
CO2: 26 mmol/L (ref 22–29)
CREATININE: 0.85 mg/dL (ref 0.70–1.30)
Chloride: 104 mmol/L (ref 98–109)
GFR calc non Af Amer: >60 mL/min (ref >60)
GLUCOSE: 182 mg/dL — AB (ref 70–140)
Potassium: 3.3 mmol/L — ABNORMAL LOW (ref 3.5–5.1)
SODIUM: 139 mmol/L (ref 136–145)
Total Bilirubin: 0.6 mg/dL (ref 0.2–1.2)
Total Protein: 6.7 g/dL (ref 6.4–8.3)

## 2017-07-29 LAB — TSH: TSH: 3.691 u[IU]/mL (ref 0.320–4.118)

## 2017-07-29 LAB — CBC WITH DIFFERENTIAL/PLATELET
Basophils Absolute: 0 10*3/uL (ref 0.0–0.1)
Basophils Relative: 0 %
EOS ABS: 0 10*3/uL (ref 0.0–0.5)
Eosinophils Relative: 1 %
HCT: 38 % — ABNORMAL LOW (ref 38.4–49.9)
Hemoglobin: 12.9 g/dL — ABNORMAL LOW (ref 13.0–17.1)
LYMPHS ABS: 0.5 10*3/uL — AB (ref 0.9–3.3)
LYMPHS PCT: 11 %
MCH: 31.6 pg (ref 27.2–33.4)
MCHC: 33.9 g/dL (ref 32.0–36.0)
MCV: 93.1 fL (ref 79.3–98.0)
Monocytes Absolute: 0.4 10*3/uL (ref 0.1–0.9)
Monocytes Relative: 8 %
Neutro Abs: 3.5 10*3/uL (ref 1.5–6.5)
Neutrophils Relative %: 80 %
Platelets: 119 10*3/uL — ABNORMAL LOW (ref 140–400)
RBC: 4.08 MIL/uL — ABNORMAL LOW (ref 4.20–5.82)
RDW: 12.8 % (ref 11.0–14.6)
WBC: 4.3 10*3/uL (ref 4.0–10.3)

## 2017-07-29 MED ORDER — SODIUM CHLORIDE 0.9 % IV SOLN
Freq: Once | INTRAVENOUS | Status: AC
  Administered 2017-07-29: 10:00:00 via INTRAVENOUS

## 2017-07-29 MED ORDER — HEPARIN SOD (PORK) LOCK FLUSH 100 UNIT/ML IV SOLN
500.0000 [IU] | Freq: Once | INTRAVENOUS | Status: AC | PRN
  Administered 2017-07-29: 500 [IU]
  Filled 2017-07-29: qty 5

## 2017-07-29 MED ORDER — SODIUM CHLORIDE 0.9% FLUSH
10.0000 mL | INTRAVENOUS | Status: DC | PRN
  Administered 2017-07-29: 10 mL
  Filled 2017-07-29: qty 10

## 2017-07-29 MED ORDER — SODIUM CHLORIDE 0.9% FLUSH
10.0000 mL | Freq: Once | INTRAVENOUS | Status: AC
  Administered 2017-07-29: 10 mL
  Filled 2017-07-29: qty 10

## 2017-07-29 MED ORDER — PEMBROLIZUMAB CHEMO INJECTION 100 MG/4ML
200.0000 mg | Freq: Once | INTRAVENOUS | Status: AC
  Administered 2017-07-29: 200 mg via INTRAVENOUS
  Filled 2017-07-29: qty 8

## 2017-07-29 MED ORDER — LEVOTHYROXINE SODIUM 25 MCG PO TABS: 25.0000 ug | ORAL_TABLET | Freq: Every day | ORAL | 3 refills | Status: AC

## 2017-07-29 NOTE — Progress Notes (Signed)
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, he is tolerating pembrolizumab well except for frequent bowel movement which I do not believe is related because typically, the frequent bowel movement was postprandial We will continue treatment without dose adjustment I plan to give him minimum 3 months of treatment before repeat imaging study  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[facility]) The liver mass is enlarging We will continue treatment indefinitely His liver function tests are within normal limits  Acquired hypothyroidism His TSH is normal since we started on thyroid replacement therapy We will continue the same dose for now  Diarrhea He has intermittent frequent bowel movement after meals I do not believe it is related to pembrolizumab I recommend Imodium as needed  Hypokalemia due to loss of potassium The cause of hypokalemia is likely due to GI loss He does not need potassium replacement therapy  CAD S/P percutaneous coronary angioplasty Patient have history of heart disease and stroke He is not taking Plavix as directed I reminded him the importance of taking antiplatelet agent   No orders of the defined types were placed in this encounter.   INTERVAL HISTORY: Please see below for problem oriented charting. He returns for further follow-up with his wife and for treatment He complained of frequent bowel movement intermittently after meals Overall, he tolerated treatment very well He has discontinue multiple  different medications His medication list is updated He did not sleep well but declined taking Remeron again The patient is avoiding to take excessive medications He denies recent infection, fever or chills Denies abdominal pain  SUMMARY OF ONCOLOGIC HISTORY:   Tongue cancer ([place])   08/20/2015 Miscellaneous    He was evaluated by ENT      08/22/2015 Imaging    CT neck showed malignant adenopathy right supraclavicular region compatible with metastatic disease. Biopsy recommended. No definite pharyngeal mass lesion identified by CT.       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 No other primary or metastatic cancer. 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         11/13/2015 Procedure    Placement of 20 French pull-through percutaneous gastrostomy tube.      03/11/2016 PET scan    Resolution of hypermetabolic right cervical lymphadenopathy since prior study . No other hypermetabolic lymphadenopathy identified. New hypermetabolic lesion and caudate lobe of liver, highly suspicious for liver metastasis. Recommend liver protocol abdomen MRI without and with contrast for further evaluation. New patchy airspace disease in  both lung apices with FDG uptake. 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 Recommend continued attention on follow-up imaging.       03/19/2016 Imaging    MRI showed 3.4 cm heterogeneously enhancing mass in caudate  lobe of liver, consistent liver metastasis. No other sites of metastatic disease identified within the liver or abdomen      03/26/2016 Procedure    Ultrasound and fluoroscopically guided right internal jugular single lumen power port catheter insertion. Tip in the SVC/RA junction. 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 Metabolic response to therapy of caudate lobe liver metastasis. Low-level activity in this area is similar to the surrounding liver.  2. Hypermetabolism in the right inguinal crease without concurrent adenopathy. Especially given subtle surrounding edema, favor cellulitis. Consider physical exam correlation. 3. Lack of visualization of previously described 7 mm left lower lobe pulmonary nodule. 4. Diffuse marrow hypermetabolism, favoring stimulation by chemotherapy.      09/20/2016 PET scan    1. Reduced activity in the hypermetabolic caudate lobe lesion of the liver, likely a metastatic lesion. 2. Scattered likely inflammatory activity along the glenohumeral and acromioclavicular joints and in the hips. 3. Healing of bilateral upper rib fractures. 4. Other imaging findings of potential clinical significance: Aortic Atherosclerosis (ICD10-I70.0). Coronary and carotid atherosclerosis. Photopenic right renal cysts.      11/17/2016 Procedure    Successful removal of 20 French pull-through gastrostomy tube.      12/09/2016 PET scan    1. Slight increase in size and degree of uptake associated with caudate lobe lesion consistent with metastatic disease 2.  Increase in size and FDG uptake associated with portacaval lymph node 3. Aortic Atherosclerosis (ICD10-I70.0). Coronary atherosclerotic changes noted.      12/27/2016 -  Chemotherapy    He received carboplatin and taxol      01/19/2017 - 01/25/2017 Hospital Admission    He was admitted to the hospital with sepsis secondary to cholangitis, managed conservatively      01/21/2017 Imaging    1. Cholelithiasis with gallbladder distension and subtle pericholecystic edema. Findings suspicious for acute cholecystitis. Right upper quadrant ultrasound could confirm. 2. Similar caudate lobe metastasis. Similar borderline porta hepatis adenopathy. 3. Left nephrolithiasis. 4. New tiny right pleural effusion with bibasilar atelectasis. 5. Trace air in the nondependent urinary bladder. Correlate with instrumentation. 6. Coronary artery atherosclerosis. Aortic Atherosclerosis (ICD10-I70.0).        03/25/2017 PET scan    1. Reduced size and significantly reduced activity in the hepatic caudate lobe metastatic lesion. Significantly reduced activity of the adjacent porta hepatis lymph node. Appearance compatible with interval effective therapy. 2. Ground-glass and sub solid densities anteriorly in the right upper lobe appear to conform to several secondary pulmonary lobules and are new compared to the right our prior exam. Low-grade metabolic activity at 2.7. Probably alveolitis/low-grade pneumonia, the morphology would be atypical for metastatic disease, but surveillance is suggested. 3. There is some focal accentuated  metabolic activity at the sigmoid colon anastomotic site. No CT correlate. There is no abnormal activity in this vicinity on 12/09/2016 and I suspect that this is physiologic, but this area also warrant surveillance. 4. Other imaging findings of potential clinical significance: Aortic Atherosclerosis (ICD10-I70.0). Coronary atherosclerosis. Cholelithiasis potentially with minimal gallbladder  wall thickening. Sigmoid colon diverticulosis. Prostatomegaly.      06/24/2017 PET scan    1. There has been interval increase in size and degree of FDG uptake associated with hypermetabolic metastasis to segment 1 of the liver. 2. Mild increase in size and moderate increase in FDG uptake associated with upper abdominal nodal metastasis. 3. Interval development of diffuse uptake throughout the axial and appendicular skeleton which is favored to represent treatment related changes. 4. Aortic atherosclerosis and 3 vessel coronary artery atherosclerotic calcifications. 5. Gallstones.      07/08/2017 -  Chemotherapy    The patient had pembrolizumab for chemotherapy treatment.        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([place])   03/22/2016 Initial Diagnosis    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([place])      07/07/2017 -  Chemotherapy    The patient had pembrolizumab (KEYTRUDA) 200 mg in sodium chloride 0.9 % 50 mL chemo infusion, 200 mg, Intravenous, Once, 2 of 6 cycles Administration: 200 mg (07/08/2017)  for chemotherapy treatment.        REVIEW OF SYSTEMS:   Constitutional: Denies fevers, chills  Eyes: Denies blurriness of vision Ears, nose, mouth, throat, and face: Denies mucositis or sore throat Respiratory: Denies cough, dyspnea or wheezes Cardiovascular: Denies palpitation, chest discomfort or lower extremity swelling Skin: Denies abnormal skin rashes Lymphatics: Denies new lymphadenopathy or easy bruising Neurological:Denies numbness, tingling or new weaknesses Behavioral/Psych: Mood is stable, no new changes   All other systems were reviewed with the patient and are negative.  I have reviewed the past medical history, past surgical history, social history and family history with the patient and they are unchanged from previous note.  ALLERGIES:  is allergic to cetuximab.  MEDICATIONS:  Current Outpatient Medications  Medication Sig Dispense Refill  . clopidogrel (PLAVIX) 75 MG tablet TAKE 1 TABLET EVERY DAY 90 tablet 2  . insulin glargine (LANTUS) 100 UNIT/ML injection Inject 0.5 mLs (50 Units total) into the skin at bedtime. 10 mL   . levothyroxine (SYNTHROID, LEVOTHROID) 25 MCG tablet Take 1 tablet (25 mcg total) by mouth daily before breakfast. 90 tablet 3  . lidocaine-prilocaine (EMLA) cream Apply 1 application topically as needed. 30 g 6  . Multiple Vitamin (MULTIVITAMIN WITH MINERALS) TABS tablet Take 1 tablet by mouth daily. 30 tablet 0  . ondansetron (ZOFRAN) 8 MG tablet Take 1 tablet (8 mg total) by mouth every 8 (eight) hours as needed for nausea or vomiting. 60 tablet 11   No current facility-administered medications for this visit.    Facility-Administered Medications Ordered in Other Visits  Medication Dose Route Frequency Provider Last Rate Last Dose  . heparin lock flush 100 unit/mL  500 Units Intracatheter  Once PRN Heath Lark, MD      . pembrolizumab [facility]) 200 mg in sodium chloride 0.9 % 50 mL chemo infusion  200 mg Intravenous Once Alvy Bimler, Diaz Crago, MD 116 mL/hr at 07/29/17 1013 200 mg at 07/29/17 1013  . sodium chloride flush (NS) 0.9 % injection 10 mL  10 mL Intracatheter PRN Alvy Bimler, Desiree Daise, MD        PHYSICAL EXAMINATION: ECOG PERFORMANCE STATUS: 1 - Symptomatic but completely ambulatory  Vitals:   07/29/17 0836  BP: (!) 141/65  Pulse: 70  Resp: 17  Temp: 98.5 F (36.9 C)  SpO2: 100%   Filed Weights   07/29/17 0836  Weight: 161 lb 4.8 oz (73.2 kg)    GENERAL:alert, no distress and comfortable SKIN: skin color, texture, turgor are normal, no rashes or  significant lesions EYES: normal, Conjunctiva are pink and non-injected, sclera clear OROPHARYNX:no exudate, no erythema and lips, buccal mucosa, and tongue normal  NECK: supple, thyroid normal size, non-tender, without nodularity LYMPH:  no palpable lymphadenopathy in the cervical, axillary or inguinal LUNGS: clear to auscultation and percussion with normal breathing effort HEART: regular rate & rhythm and no murmurs and no lower extremity edema ABDOMEN:abdomen soft, non-tender and normal bowel sounds Musculoskeletal:no cyanosis of digits and no clubbing  NEURO: alert & oriented x 3 with fluent speech, no focal motor/sensory deficits  LABORATORY DATA:  I have reviewed the data as listed    Component Value Date/Time   NA 139 07/29/2017 0806   NA 140 02/07/2017 0811   K 3.3 (L) 07/29/2017 0806   K 3.9 02/07/2017 0811   CL 104 07/29/2017 0806   CO2 26 07/29/2017 0806   CO2 25 02/07/2017 0811   GLUCOSE 182 (H) 07/29/2017 0806   GLUCOSE 118 02/07/2017 0811   BUN 9 07/29/2017 0806   BUN 17.8 02/07/2017 0811   CREATININE 0.85 07/29/2017 0806   CREATININE 1.0 02/07/2017 0811   CALCIUM 9.8 07/29/2017 0806   CALCIUM 9.4 02/07/2017 0811   PROT 6.7 07/29/2017 0806   PROT 6.7 02/07/2017 0811   ALBUMIN 4.1 07/29/2017 0806   ALBUMIN 3.2 (L) 02/07/2017 0811   AST 15 07/29/2017 0806   AST 16 02/07/2017 0811   ALT 8 07/29/2017 0806   ALT 17 02/07/2017 0811   ALKPHOS 96 07/29/2017 0806   ALKPHOS 144 02/07/2017 0811   BILITOT 0.6 07/29/2017 0806   BILITOT 0.70 02/07/2017 0811   GFRNONAA >60 07/29/2017 0806   GFRAA >60 07/29/2017 0806    No results found for: SPEP, UPEP  Lab Results  Component Value Date   WBC 4.3 07/29/2017   NEUTROABS 3.5 07/29/2017   HGB 12.9 (L) 07/29/2017   HCT 38.0 (L) 07/29/2017   MCV 93.1 07/29/2017   PLT 119 (L) 07/29/2017      Chemistry      Component Value Date/Time   NA 139 07/29/2017 0806   NA 140 02/07/2017 0811   K 3.3 (L) 07/29/2017 0806    K 3.9 02/07/2017 0811   CL 104 07/29/2017 0806   CO2 26 07/29/2017 0806   CO2 25 02/07/2017 0811   BUN 9 07/29/2017 0806   BUN 17.8 02/07/2017 0811   CREATININE 0.85 07/29/2017 0806   CREATININE 1.0 02/07/2017 0811   GLU 78 11/13/2015 1134      Component Value Date/Time   CALCIUM 9.8 07/29/2017 0806   CALCIUM 9.4 02/07/2017 0811   ALKPHOS 96 07/29/2017 0806   ALKPHOS 144 02/07/2017 0811   AST 15 07/29/2017  0806   AST 16 02/07/2017 0811   ALT 8 07/29/2017 0806   ALT 17 02/07/2017 0811   BILITOT 0.6 07/29/2017 0806   BILITOT 0.70 02/07/2017 0811       All questions were answered. The patient knows to call the clinic with any problems, questions or concerns. No barriers to learning was detected.  I spent 25 minutes counseling the patient face to face. The total time spent in the appointment was 30 minutes and more than 50% was on counseling and review of test results  Heath Lark, MD 07/29/2017 10:40 AM

## 2017-07-29 NOTE — Telephone Encounter (Signed)
Gave avs and calendar ° °

## 2017-07-29 NOTE — Assessment & Plan Note (Signed)
So far, he is tolerating pembrolizumab well except for frequent bowel movement which I do not believe is related because typically, the frequent bowel movement was postprandial We will continue treatment without dose adjustment I plan to give him minimum 3 months of treatment before repeat imaging study

## 2017-07-29 NOTE — Progress Notes (Signed)
Given a copy of high potassium food list and instructed to try to eat some of the foods from the list. He verbalized understanding.

## 2017-07-29 NOTE — Assessment & Plan Note (Signed)
The liver mass is enlarging We will continue treatment indefinitely His liver function tests are within normal limits

## 2017-07-29 NOTE — Assessment & Plan Note (Signed)
He has intermittent frequent bowel movement after meals I do not believe it is related to pembrolizumab I recommend Imodium as needed

## 2017-07-29 NOTE — Telephone Encounter (Signed)
-----   Message from Heath Lark, MD sent at 07/29/2017 10:36 AM EDT ----- Regarding: TSH normal range Pls let infusion nurse aware to tell patient TSH is in normal range now I will refill his thyroid medicine as discussed

## 2017-07-29 NOTE — Patient Instructions (Signed)
[facility] Discharge Instructions for Patients Receiving Chemotherapy  Today you received the following chemotherapy agents :  Keytruda.  To help prevent nausea and vomiting after your treatment, we encourage you to take your nausea medication as prescribed.   If you develop nausea and vomiting that is not controlled by your nausea medication, call the clinic.   BELOW ARE SYMPTOMS THAT SHOULD BE REPORTED IMMEDIATELY:  *FEVER GREATER THAN 100.5 F  *CHILLS WITH OR WITHOUT FEVER  NAUSEA AND VOMITING THAT IS NOT CONTROLLED WITH YOUR NAUSEA MEDICATION  *UNUSUAL SHORTNESS OF BREATH  *UNUSUAL BRUISING OR BLEEDING  TENDERNESS IN MOUTH AND THROAT WITH OR WITHOUT PRESENCE OF ULCERS  *URINARY PROBLEMS  *BOWEL PROBLEMS  UNUSUAL RASH Items with * indicate a potential emergency and should be followed up as soon as possible.  Feel free to call the clinic should you have any questions or concerns. The clinic phone number is [phone].  Please show the CHEMO ALERT CARD at check-in to the Emergency Department and triage nurse.  

## 2017-07-29 NOTE — Assessment & Plan Note (Signed)
Patient have history of heart disease and stroke He is not taking Plavix as directed I reminded him the importance of taking antiplatelet agent

## 2017-07-29 NOTE — Assessment & Plan Note (Signed)
His TSH is normal since we started on thyroid replacement therapy We will continue the same dose for now

## 2017-07-29 NOTE — Assessment & Plan Note (Addendum)
The cause of hypokalemia is likely due to GI loss He does not need potassium replacement therapy

## 2017-07-29 NOTE — Telephone Encounter (Signed)
Called and left below message. Instructed to call if he has questions

## 2017-08-19 ENCOUNTER — Inpatient Hospital Stay: Payer: Medicare Other | Attending: Hematology and Oncology

## 2017-08-19 ENCOUNTER — Inpatient Hospital Stay: Payer: Medicare Other

## 2017-08-19 ENCOUNTER — Encounter: Payer: Self-pay | Admitting: Hematology and Oncology

## 2017-08-19 ENCOUNTER — Inpatient Hospital Stay (HOSPITAL_BASED_OUTPATIENT_CLINIC_OR_DEPARTMENT_OTHER): Payer: Medicare Other | Admitting: Hematology and Oncology

## 2017-08-19 DIAGNOSIS — C01 Malignant neoplasm of base of tongue: Secondary | ICD-10-CM | POA: Insufficient documentation

## 2017-08-19 DIAGNOSIS — D696 Thrombocytopenia, unspecified: Secondary | ICD-10-CM | POA: Diagnosis not present

## 2017-08-19 DIAGNOSIS — R110 Nausea: Secondary | ICD-10-CM

## 2017-08-19 DIAGNOSIS — C029 Malignant neoplasm of tongue, unspecified: Secondary | ICD-10-CM

## 2017-08-19 DIAGNOSIS — Z79899 Other long term (current) drug therapy: Secondary | ICD-10-CM | POA: Insufficient documentation

## 2017-08-19 DIAGNOSIS — Z5111 Encounter for antineoplastic chemotherapy: Secondary | ICD-10-CM

## 2017-08-19 DIAGNOSIS — C770 Secondary and unspecified malignant neoplasm of lymph nodes of head, face and neck: Secondary | ICD-10-CM

## 2017-08-19 DIAGNOSIS — G4700 Insomnia, unspecified: Secondary | ICD-10-CM

## 2017-08-19 DIAGNOSIS — C787 Secondary malignant neoplasm of liver and intrahepatic bile duct: Secondary | ICD-10-CM | POA: Diagnosis not present

## 2017-08-19 DIAGNOSIS — Z5112 Encounter for antineoplastic immunotherapy: Secondary | ICD-10-CM | POA: Insufficient documentation

## 2017-08-19 LAB — CBC WITH DIFFERENTIAL/PLATELET
BASOS PCT: 0 %
Basophils Absolute: 0 10*3/uL (ref 0.0–0.1)
EOS ABS: 0 10*3/uL (ref 0.0–0.5)
EOS PCT: 1 %
HCT: 39.1 % (ref 38.4–49.9)
Hemoglobin: 13.2 g/dL (ref 13.0–17.1)
LYMPHS ABS: 0.4 10*3/uL — AB (ref 0.9–3.3)
Lymphocytes Relative: 7 %
MCH: 30.6 pg (ref 27.2–33.4)
MCHC: 33.7 g/dL (ref 32.0–36.0)
MCV: 90.9 fL (ref 79.3–98.0)
MONO ABS: 0.3 10*3/uL (ref 0.1–0.9)
MONOS PCT: 7 %
NEUTROS PCT: 85 %
Neutro Abs: 4.1 10*3/uL (ref 1.5–6.5)
PLATELETS: 125 10*3/uL — AB (ref 140–400)
RBC: 4.29 MIL/uL (ref 4.20–5.82)
RDW: 12.6 % (ref 11.0–14.6)
WBC: 4.9 10*3/uL (ref 4.0–10.3)

## 2017-08-19 LAB — COMPREHENSIVE METABOLIC PANEL
ALT: 15 U/L (ref 0–44)
AST: 17 U/L (ref 15–41)
Albumin: 4 g/dL (ref 3.5–5.0)
Alkaline Phosphatase: 99 U/L (ref 38–126)
Anion gap: 8 (ref 5–15)
BUN: 11 mg/dL (ref 8–23)
CALCIUM: 9.8 mg/dL (ref 8.9–10.3)
CO2: 29 mmol/L (ref 22–32)
CREATININE: 0.86 mg/dL (ref 0.61–1.24)
Chloride: 103 mmol/L (ref 98–111)
GLUCOSE: 243 mg/dL — AB (ref 70–99)
Potassium: 3.7 mmol/L (ref 3.5–5.1)
Sodium: 140 mmol/L (ref 135–145)
Total Bilirubin: 0.5 mg/dL (ref 0.3–1.2)
Total Protein: 6.7 g/dL (ref 6.5–8.1)

## 2017-08-19 LAB — TSH: TSH: 3.468 u[IU]/mL (ref 0.320–4.118)

## 2017-08-19 MED ORDER — HEPARIN SOD (PORK) LOCK FLUSH 100 UNIT/ML IV SOLN
500.0000 [IU] | Freq: Once | INTRAVENOUS | Status: AC | PRN
  Administered 2017-08-19: 500 [IU]
  Filled 2017-08-19: qty 5

## 2017-08-19 MED ORDER — SODIUM CHLORIDE 0.9% FLUSH
10.0000 mL | Freq: Once | INTRAVENOUS | Status: AC
  Administered 2017-08-19: 10 mL
  Filled 2017-08-19: qty 10

## 2017-08-19 MED ORDER — SODIUM CHLORIDE 0.9 % IV SOLN
Freq: Once | INTRAVENOUS | Status: AC
  Administered 2017-08-19: 11:00:00 via INTRAVENOUS

## 2017-08-19 MED ORDER — SODIUM CHLORIDE 0.9% FLUSH
10.0000 mL | INTRAVENOUS | Status: DC | PRN
  Administered 2017-08-19: 10 mL
  Filled 2017-08-19: qty 10

## 2017-08-19 MED ORDER — SODIUM CHLORIDE 0.9 % IV SOLN
200.0000 mg | Freq: Once | INTRAVENOUS | Status: AC
  Administered 2017-08-19: 200 mg via INTRAVENOUS
  Filled 2017-08-19: qty 8

## 2017-08-19 NOTE — Assessment & Plan Note (Signed)
The cause could be related to his underlying medical condition. It is mild and there is little change compared from previous platelet count. The patient denies recent history of bleeding such as epistaxis, hematuria or hematochezia. He is asymptomatic from the thrombocytopenia. I will observe for now.  he does not require further evaluation or treatment for this

## 2017-08-19 NOTE — Assessment & Plan Note (Signed)
So far, he is tolerating pembrolizumab well except for insomnia We will continue treatment without dose adjustment I plan to give him minimum 3 months of treatment before repeat imaging study

## 2017-08-19 NOTE — Progress Notes (Signed)
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 It is mild and there is little change compared from previous platelet count. The patient denies recent history of bleeding such as epistaxis, hematuria or hematochezia. He is asymptomatic from the thrombocytopenia. I will observe for now.  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   INTERVAL HISTORY: Please see below for problem oriented charting. He returns with his wife for further follow-up and treatment He feels well except for insomnia He denies side effects from treatment such as nausea, infection, cough or changes in bowel habits His appetite is stable although he has lost a bit of weight since the last time I saw him He denies abdominal pain  SUMMARY OF ONCOLOGIC HISTORY:   Tongue cancer ([place])   08/20/2015 Miscellaneous    He was evaluated by ENT      08/22/2015 Imaging    CT neck showed malignant adenopathy right supraclavicular region compatible with metastatic disease. Biopsy recommended. No definite pharyngeal mass lesion identified by CT.       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 No other primary or metastatic cancer. 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         11/13/2015 Procedure    Placement of 20 French pull-through percutaneous gastrostomy tube.      03/11/2016 PET scan    Resolution of hypermetabolic right cervical lymphadenopathy since prior study . No other hypermetabolic lymphadenopathy identified. New hypermetabolic lesion and caudate lobe of liver, highly suspicious for liver metastasis. Recommend liver protocol abdomen MRI without and with contrast for further evaluation. New patchy airspace disease in both lung apices with FDG uptake. 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 Recommend continued attention on follow-up imaging.       03/19/2016 Imaging    MRI showed 3.4  cm heterogeneously enhancing mass in caudate lobe of liver, consistent liver metastasis. No other sites of metastatic disease identified within the liver or abdomen      03/26/2016 Procedure    Ultrasound and fluoroscopically guided right internal jugular single lumen power port catheter insertion. Tip in the SVC/RA junction.  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 Metabolic response to therapy of caudate lobe liver metastasis. Low-level activity in this area is similar to the surrounding liver.  2. Hypermetabolism in the right inguinal crease without concurrent adenopathy. Especially given subtle surrounding edema, favor cellulitis. Consider physical exam correlation. 3. Lack of visualization of previously described 7 mm left lower lobe pulmonary nodule. 4. Diffuse marrow hypermetabolism, favoring stimulation by chemotherapy.      09/20/2016 PET scan    1. Reduced activity in the hypermetabolic caudate lobe lesion of the liver, likely a metastatic lesion. 2. Scattered likely inflammatory activity along the glenohumeral and acromioclavicular joints and in the hips. 3. Healing of bilateral upper rib fractures. 4. Other imaging findings of potential clinical significance: Aortic Atherosclerosis (ICD10-I70.0). Coronary and carotid atherosclerosis. Photopenic right renal cysts.      11/17/2016 Procedure    Successful removal of 20 French pull-through gastrostomy tube.      12/09/2016 PET scan    1. Slight increase in size and degree of uptake associated with caudate lobe lesion consistent with metastatic disease 2. Increase in size and FDG uptake associated with portacaval lymph node 3. Aortic Atherosclerosis (ICD10-I70.0). Coronary atherosclerotic changes noted.      12/27/2016 -  Chemotherapy    He received carboplatin and taxol      01/19/2017 - 01/25/2017 Hospital Admission    He was  admitted to the hospital with sepsis secondary to cholangitis, managed conservatively      01/21/2017 Imaging    1. Cholelithiasis with gallbladder distension and subtle pericholecystic edema. Findings suspicious for acute cholecystitis. Right upper quadrant ultrasound could confirm. 2. Similar caudate lobe metastasis. Similar borderline porta hepatis adenopathy. 3. Left nephrolithiasis. 4. New tiny right pleural effusion with bibasilar atelectasis. 5. Trace air in the nondependent urinary bladder. Correlate with instrumentation. 6. Coronary artery atherosclerosis. Aortic Atherosclerosis (ICD10-I70.0).        03/25/2017 PET scan    1. Reduced size and significantly reduced activity in the hepatic caudate lobe metastatic lesion. Significantly reduced activity of the adjacent porta hepatis lymph node. Appearance compatible with interval effective therapy. 2. Ground-glass and sub solid densities anteriorly in the right upper lobe appear to conform to several secondary pulmonary lobules and are new compared to the right our prior exam. Low-grade metabolic activity at 2.7. Probably alveolitis/low-grade pneumonia, the morphology would be atypical for metastatic disease, but surveillance is suggested. 3. There is some focal accentuated metabolic activity at the sigmoid colon anastomotic site. No CT correlate. There is no abnormal activity in this vicinity on 12/09/2016 and I suspect that this is physiologic, but this area also warrant surveillance. 4. Other imaging findings of potential clinical significance:  Aortic Atherosclerosis (ICD10-I70.0). Coronary atherosclerosis. Cholelithiasis potentially with minimal gallbladder wall thickening. Sigmoid colon diverticulosis. Prostatomegaly.      06/24/2017 PET scan    1. There has been interval increase in size and degree of FDG uptake associated with hypermetabolic metastasis to segment 1 of the liver. 2. Mild increase in size and moderate increase in FDG  uptake associated with upper abdominal nodal metastasis. 3. Interval development of diffuse uptake throughout the axial and appendicular skeleton which is favored to represent treatment related changes. 4. Aortic atherosclerosis and 3 vessel coronary artery atherosclerotic calcifications. 5. Gallstones.      07/08/2017 -  Chemotherapy    The patient had pembrolizumab for chemotherapy treatment.        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([place])   03/22/2016 Initial Diagnosis    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([place])      07/07/2017 -  Chemotherapy    The patient had pembrolizumab (KEYTRUDA) 200 mg in sodium chloride 0.9 % 50 mL chemo infusion, 200 mg, Intravenous, Once, 3 of 6 cycles Administration: 200 mg (07/08/2017), 200 mg (07/29/2017)  for chemotherapy treatment.        REVIEW OF SYSTEMS:   Constitutional: Denies fevers, chills or abnormal weight loss Eyes: Denies blurriness of vision Ears, nose, mouth, throat, and face: Denies mucositis or sore throat Respiratory: Denies cough, dyspnea or wheezes Cardiovascular: Denies palpitation, chest discomfort or lower extremity swelling Gastrointestinal:  Denies nausea, heartburn or change in bowel habits Skin: Denies abnormal skin rashes Lymphatics: Denies new lymphadenopathy or easy bruising Neurological:Denies numbness, tingling or new weaknesses Behavioral/Psych: Mood is stable, no new changes  All other systems were reviewed with the patient and are negative.  I have reviewed the past medical history, past surgical history, social history and  family history with the patient and they are unchanged from previous note.  ALLERGIES:  is allergic to cetuximab.  MEDICATIONS:  Current Outpatient Medications  Medication Sig Dispense Refill  . clopidogrel (PLAVIX) 75 MG tablet TAKE 1 TABLET EVERY DAY 90 tablet 2  . insulin glargine (LANTUS) 100 UNIT/ML injection Inject 0.5 mLs (50 Units total) into the skin at bedtime. 10 mL   . levothyroxine (SYNTHROID, LEVOTHROID) 25 MCG tablet Take 1 tablet (25 mcg total) by mouth daily before breakfast. 90 tablet 3  . lidocaine-prilocaine (EMLA) cream Apply 1 application topically as needed. 30 g 6  . Multiple Vitamin (MULTIVITAMIN WITH MINERALS) TABS tablet Take 1 tablet by mouth daily. 30 tablet 0  . ondansetron (ZOFRAN) 8 MG tablet Take 1 tablet (8 mg total) by mouth every 8 (eight) hours as needed for nausea or vomiting. 60 tablet 11   No current facility-administered medications for this visit.    Facility-Administered Medications Ordered in Other Visits  Medication Dose Route Frequency Provider Last Rate Last Dose  . heparin lock flush 100 unit/mL  500 Units Intracatheter Once PRN Alvy Bimler, Mihira Tozzi, MD      . pembrolizumab (KEYTRUDA) 200 mg in sodium chloride 0.9 % 50 mL chemo  infusion  200 mg Intravenous Once Heath Lark, MD 116 mL/hr at 08/19/17 1150 200 mg at 08/19/17 1150  . sodium chloride flush (NS) 0.9 % injection 10 mL  10 mL Intracatheter PRN Alvy Bimler, Kahlia Lagunes, MD        PHYSICAL EXAMINATION: ECOG PERFORMANCE STATUS: 0 - Asymptomatic  Vitals:   08/19/17 1004  BP: 139/68  Pulse: 72  Resp: 18  Temp: 98.4 F (36.9 C)  SpO2: 100%   Filed Weights   08/19/17 1004  Weight: 158 lb 12.8 oz (72 kg)    GENERAL:alert, no distress and comfortable SKIN: skin color, texture, turgor are normal, no rashes or significant lesions EYES: normal, Conjunctiva are pink and non-injected, sclera clear OROPHARYNX:no exudate, no erythema and lips, buccal mucosa, and tongue normal  NECK: supple, thyroid  normal size, non-tender, without nodularity LYMPH:  no palpable lymphadenopathy in the cervical, axillary or inguinal LUNGS: clear to auscultation and percussion with normal breathing effort HEART: regular rate & rhythm and no murmurs and no lower extremity edema ABDOMEN:abdomen soft, non-tender and normal bowel sounds Musculoskeletal:no cyanosis of digits and no clubbing  NEURO: alert & oriented x 3 with fluent speech, no focal motor/sensory deficits  LABORATORY DATA:  I have reviewed the data as listed    Component Value Date/Time   NA 140 08/19/2017 0927   NA 140 02/07/2017 0811   K 3.7 08/19/2017 0927   K 3.9 02/07/2017 0811   CL 103 08/19/2017 0927   CO2 29 08/19/2017 0927   CO2 25 02/07/2017 0811   GLUCOSE 243 (H) 08/19/2017 0927   GLUCOSE 118 02/07/2017 0811   BUN 11 08/19/2017 0927   BUN 17.8 02/07/2017 0811   CREATININE 0.86 08/19/2017 0927   CREATININE 1.0 02/07/2017 0811   CALCIUM 9.8 08/19/2017 0927   CALCIUM 9.4 02/07/2017 0811   PROT 6.7 08/19/2017 0927   PROT 6.7 02/07/2017 0811   ALBUMIN 4.0 08/19/2017 0927   ALBUMIN 3.2 (L) 02/07/2017 0811   AST 17 08/19/2017 0927   AST 16 02/07/2017 0811   ALT 15 08/19/2017 0927   ALT 17 02/07/2017 0811   ALKPHOS 99 08/19/2017 0927   ALKPHOS 144 02/07/2017 0811   BILITOT 0.5 08/19/2017 0927   BILITOT 0.70 02/07/2017 0811   GFRNONAA >60 08/19/2017 0927   GFRAA >60 08/19/2017 0927    No results found for: SPEP, UPEP  Lab Results  Component Value Date   WBC 4.9 08/19/2017   NEUTROABS 4.1 08/19/2017   HGB 13.2 08/19/2017   HCT 39.1 08/19/2017   MCV 90.9 08/19/2017   PLT 125 (L) 08/19/2017      Chemistry      Component Value Date/Time   NA 140 08/19/2017 0927   NA 140 02/07/2017 0811   K 3.7 08/19/2017 0927   K 3.9 02/07/2017 0811   CL 103 08/19/2017 0927   CO2 29 08/19/2017 0927   CO2 25 02/07/2017 0811   BUN 11 08/19/2017 0927   BUN 17.8 02/07/2017 0811   CREATININE 0.86 08/19/2017 0927   CREATININE  1.0 02/07/2017 0811   GLU 78 11/13/2015 1134      Component Value Date/Time   CALCIUM 9.8 08/19/2017 0927   CALCIUM 9.4 02/07/2017 0811   ALKPHOS 99 08/19/2017 0927   ALKPHOS 144 02/07/2017 0811   AST 17 08/19/2017 0927   AST 16 02/07/2017 0811   ALT 15 08/19/2017 0927   ALT 17 02/07/2017 0811   BILITOT 0.5 08/19/2017 0927   BILITOT 0.70 02/07/2017 3154  All questions were answered. The patient knows to call the clinic with any problems, questions or concerns. No barriers to learning was detected.  I spent 15 minutes counseling the patient face to face. The total time spent in the appointment was 20 minutes and more than 50% was on counseling and review of test results  Heath Lark, MD 08/19/2017 11:57 AM

## 2017-08-19 NOTE — Assessment & Plan Note (Signed)
The liver mass is enlarging We will continue treatment indefinitely His liver function tests are within normal limits

## 2017-08-19 NOTE — Assessment & Plan Note (Signed)
The cause is unknown We discussed either resumption of Remeron or to try over-the-counter remedies The patient declined treatment for now

## 2017-08-19 NOTE — Patient Instructions (Signed)
[facility] Discharge Instructions for Patients Receiving Chemotherapy  Today you received the following chemotherapy agents :  Keytruda.  To help prevent nausea and vomiting after your treatment, we encourage you to take your nausea medication as prescribed.   If you develop nausea and vomiting that is not controlled by your nausea medication, call the clinic.   BELOW ARE SYMPTOMS THAT SHOULD BE REPORTED IMMEDIATELY:  *FEVER GREATER THAN 100.5 F  *CHILLS WITH OR WITHOUT FEVER  NAUSEA AND VOMITING THAT IS NOT CONTROLLED WITH YOUR NAUSEA MEDICATION  *UNUSUAL SHORTNESS OF BREATH  *UNUSUAL BRUISING OR BLEEDING  TENDERNESS IN MOUTH AND THROAT WITH OR WITHOUT PRESENCE OF ULCERS  *URINARY PROBLEMS  *BOWEL PROBLEMS  UNUSUAL RASH Items with * indicate a potential emergency and should be followed up as soon as possible.  Feel free to call the clinic should you have any questions or concerns. The clinic phone number is [phone].  Please show the CHEMO ALERT CARD at check-in to the Emergency Department and triage nurse.  

## 2017-09-09 ENCOUNTER — Inpatient Hospital Stay: Payer: Medicare Other

## 2017-09-09 ENCOUNTER — Inpatient Hospital Stay: Payer: Medicare Other | Attending: Hematology and Oncology

## 2017-09-09 ENCOUNTER — Telehealth: Payer: Self-pay | Admitting: Hematology and Oncology

## 2017-09-09 ENCOUNTER — Inpatient Hospital Stay (HOSPITAL_BASED_OUTPATIENT_CLINIC_OR_DEPARTMENT_OTHER): Payer: Medicare Other | Admitting: Hematology and Oncology

## 2017-09-09 ENCOUNTER — Encounter: Payer: Self-pay | Admitting: Hematology and Oncology

## 2017-09-09 VITALS — BP 118/54 | HR 50 | Temp 98.4°F | Resp 18 | Ht 68.0 in | Wt 154.6 lb

## 2017-09-09 DIAGNOSIS — C01 Malignant neoplasm of base of tongue: Secondary | ICD-10-CM

## 2017-09-09 DIAGNOSIS — Z794 Long term (current) use of insulin: Secondary | ICD-10-CM

## 2017-09-09 DIAGNOSIS — R42 Dizziness and giddiness: Secondary | ICD-10-CM

## 2017-09-09 DIAGNOSIS — C787 Secondary malignant neoplasm of liver and intrahepatic bile duct: Secondary | ICD-10-CM

## 2017-09-09 DIAGNOSIS — C770 Secondary and unspecified malignant neoplasm of lymph nodes of head, face and neck: Secondary | ICD-10-CM

## 2017-09-09 DIAGNOSIS — Z5111 Encounter for antineoplastic chemotherapy: Secondary | ICD-10-CM

## 2017-09-09 DIAGNOSIS — Z5112 Encounter for antineoplastic immunotherapy: Secondary | ICD-10-CM | POA: Diagnosis present

## 2017-09-09 DIAGNOSIS — C029 Malignant neoplasm of tongue, unspecified: Secondary | ICD-10-CM

## 2017-09-09 DIAGNOSIS — R110 Nausea: Secondary | ICD-10-CM

## 2017-09-09 DIAGNOSIS — I700 Atherosclerosis of aorta: Secondary | ICD-10-CM | POA: Diagnosis not present

## 2017-09-09 DIAGNOSIS — E039 Hypothyroidism, unspecified: Secondary | ICD-10-CM

## 2017-09-09 DIAGNOSIS — E1140 Type 2 diabetes mellitus with diabetic neuropathy, unspecified: Secondary | ICD-10-CM

## 2017-09-09 DIAGNOSIS — D696 Thrombocytopenia, unspecified: Secondary | ICD-10-CM | POA: Diagnosis not present

## 2017-09-09 DIAGNOSIS — Z79899 Other long term (current) drug therapy: Secondary | ICD-10-CM | POA: Diagnosis not present

## 2017-09-09 DIAGNOSIS — Z923 Personal history of irradiation: Secondary | ICD-10-CM | POA: Insufficient documentation

## 2017-09-09 LAB — CBC WITH DIFFERENTIAL/PLATELET
Basophils Absolute: 0 10*3/uL (ref 0.0–0.1)
Basophils Relative: 1 %
Eosinophils Absolute: 0 10*3/uL (ref 0.0–0.5)
Eosinophils Relative: 1 %
HCT: 40.3 % (ref 38.4–49.9)
Hemoglobin: 13.6 g/dL (ref 13.0–17.1)
Lymphocytes Relative: 8 %
Lymphs Abs: 0.5 10*3/uL — ABNORMAL LOW (ref 0.9–3.3)
MCH: 30.3 pg (ref 27.2–33.4)
MCHC: 33.7 g/dL (ref 32.0–36.0)
MCV: 90.1 fL (ref 79.3–98.0)
Monocytes Absolute: 0.4 10*3/uL (ref 0.1–0.9)
Monocytes Relative: 6 %
Neutro Abs: 4.8 10*3/uL (ref 1.5–6.5)
Neutrophils Relative %: 84 %
PLATELETS: 111 10*3/uL — AB (ref 140–400)
RBC: 4.48 MIL/uL (ref 4.20–5.82)
RDW: 12.9 % (ref 11.0–14.6)
WBC: 5.7 10*3/uL (ref 4.0–10.3)

## 2017-09-09 LAB — COMPREHENSIVE METABOLIC PANEL
ALT: 30 U/L (ref 0–44)
AST: 30 U/L (ref 15–41)
Albumin: 3.7 g/dL (ref 3.5–5.0)
Alkaline Phosphatase: 93 U/L (ref 38–126)
Anion gap: 11 (ref 5–15)
BUN: 17 mg/dL (ref 8–23)
CO2: 25 mmol/L (ref 22–32)
Calcium: 9.5 mg/dL (ref 8.9–10.3)
Chloride: 103 mmol/L (ref 98–111)
Creatinine, Ser: 0.98 mg/dL (ref 0.61–1.24)
GFR calc Af Amer: >60 mL/min (ref >60)
GFR calc non Af Amer: >60 mL/min (ref >60)
Glucose, Bld: 419 mg/dL — ABNORMAL HIGH (ref 70–99)
Potassium: 3.4 mmol/L — ABNORMAL LOW (ref 3.5–5.1)
Sodium: 139 mmol/L (ref 135–145)
Total Bilirubin: 0.7 mg/dL (ref 0.3–1.2)
Total Protein: 6.6 g/dL (ref 6.5–8.1)

## 2017-09-09 LAB — TSH: TSH: 3.889 u[IU]/mL (ref 0.320–4.118)

## 2017-09-09 MED ORDER — SODIUM CHLORIDE 0.9% FLUSH
10.0000 mL | INTRAVENOUS | Status: DC | PRN
  Administered 2017-09-09: 10 mL
  Filled 2017-09-09: qty 10

## 2017-09-09 MED ORDER — HEPARIN SOD (PORK) LOCK FLUSH 100 UNIT/ML IV SOLN
500.0000 [IU] | Freq: Once | INTRAVENOUS | Status: AC | PRN
  Administered 2017-09-09: 500 [IU]
  Filled 2017-09-09: qty 5

## 2017-09-09 MED ORDER — SODIUM CHLORIDE 0.9 % IV SOLN
Freq: Once | INTRAVENOUS | Status: AC
  Administered 2017-09-09: 10:00:00 via INTRAVENOUS
  Filled 2017-09-09: qty 250

## 2017-09-09 MED ORDER — INSULIN REGULAR HUMAN 100 UNIT/ML IJ SOLN
12.0000 [IU] | Freq: Once | INTRAMUSCULAR | Status: AC
  Administered 2017-09-09: 12 [IU] via SUBCUTANEOUS
  Filled 2017-09-09: qty 0.12

## 2017-09-09 MED ORDER — SODIUM CHLORIDE 0.9% FLUSH
10.0000 mL | Freq: Once | INTRAVENOUS | Status: AC
  Administered 2017-09-09: 10 mL
  Filled 2017-09-09: qty 10

## 2017-09-09 MED ORDER — SODIUM CHLORIDE 0.9 % IV SOLN
200.0000 mg | Freq: Once | INTRAVENOUS | Status: AC
  Administered 2017-09-09: 200 mg via INTRAVENOUS
  Filled 2017-09-09: qty 8

## 2017-09-09 NOTE — Patient Instructions (Signed)
[facility] Discharge Instructions for Patients Receiving Chemotherapy  Today you received the following chemotherapy agents Pembrolizumab (Keytruda).  To help prevent nausea and vomiting after your treatment, we encourage you to take your nausea medication as prescribed.   If you develop nausea and vomiting that is not controlled by your nausea medication, call the clinic.   BELOW ARE SYMPTOMS THAT SHOULD BE REPORTED IMMEDIATELY:  *FEVER GREATER THAN 100.5 F  *CHILLS WITH OR WITHOUT FEVER  NAUSEA AND VOMITING THAT IS NOT CONTROLLED WITH YOUR NAUSEA MEDICATION  *UNUSUAL SHORTNESS OF BREATH  *UNUSUAL BRUISING OR BLEEDING  TENDERNESS IN MOUTH AND THROAT WITH OR WITHOUT PRESENCE OF ULCERS  *URINARY PROBLEMS  *BOWEL PROBLEMS  UNUSUAL RASH Items with * indicate a potential emergency and should be followed up as soon as possible.  Feel free to call the clinic should you have any questions or concerns. The clinic phone number is [phone].  Please show the CHEMO ALERT CARD at check-in to the Emergency Department and triage nurse.   

## 2017-09-09 NOTE — Progress Notes (Signed)
Spoke with RN about elevated blood glucose. She is obtaining order for insulin from MD.   Demetrius Charity, PharmD Oncology Pharmacist  Pharmacy Phone: [phone] 09/09/2017

## 2017-09-09 NOTE — Telephone Encounter (Signed)
Gave patient avs and calendar of upcoming appts.  °

## 2017-09-09 NOTE — Assessment & Plan Note (Signed)
The patient stopped taking insulin 2 weeks ago He felt that he did not need it due to his poor appetite and reduced oral intake and recent weight loss He was surprised to see that his blood sugar was over 400 today I reinforced the importance of diabetes control, in view of prior history of stroke We will give him 12 units of regular insulin while he is receiving treatment today

## 2017-09-09 NOTE — Assessment & Plan Note (Signed)
The cause could be related to his underlying medical condition. It is mild and there is little change compared from previous platelet count. The patient denies recent history of bleeding such as epistaxis, hematuria or hematochezia. He is asymptomatic from the thrombocytopenia. I will observe for now.  he does not require further evaluation or treatment for this

## 2017-09-09 NOTE — Assessment & Plan Note (Signed)
He is noted to be bradycardic but asymptomatic except for occasional dizziness TSH was within normal limits and he will continue the same dose of levothyroxine

## 2017-09-09 NOTE — Assessment & Plan Note (Signed)
He has symptoms of dizziness He is noted to have profound low blood pressure I suspect it is due to dehydration due to poor oral intake and uncontrolled diabetes I recommend increase fluid intake as tolerated

## 2017-09-09 NOTE — Assessment & Plan Note (Signed)
The patient is asking why his infusion is every 3 weeks and not more frequent I explained to the patient that the treatment is given as part of her protocol and given him more frequent treatment would not make sense I plan to repeat PET imaging in 3 weeks before his next dose of treatment Due to scheduling issue, he will have his PET CT scan done in 3 weeks and see me the following week

## 2017-09-09 NOTE — Progress Notes (Signed)
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[facility]) The patient is asking why his infusion is every 3 weeks and not more frequent I explained to the patient that the treatment is given as part of her protocol and given him more frequent treatment would not make sense I plan to repeat PET imaging in 3 weeks before his next dose of treatment Due to scheduling issue, he will have his PET CT scan done in 3 weeks and see me the following week  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 It is mild and there is little change compared from previous platelet count. The patient denies recent history of bleeding such as epistaxis, hematuria or hematochezia. He is asymptomatic from the thrombocytopenia. I will observe for now.  he does not require further evaluation or treatment for this   Diabetes mellitus with diabetic neuropathy, with long-term current use of insulin ([place]) The patient stopped taking insulin 2 weeks ago He felt that he did not need it due to his poor appetite and reduced oral intake and recent weight loss He was surprised to see that his blood sugar was over 400 today I reinforced the importance of diabetes control, in view of prior history of stroke We will give him 12 units of regular insulin while he is receiving treatment today  Acquired hypothyroidism He  is noted to be bradycardic but asymptomatic except for occasional dizziness TSH was within normal limits and he will continue the same dose of levothyroxine  Dizziness He has symptoms of dizziness He is noted to have profound low blood pressure I suspect it is due to dehydration due to poor oral intake and uncontrolled diabetes I recommend increase fluid intake as tolerated   Orders Placed This Encounter  Procedures  . NM PET Image Restag (PS) Skull Base To Thigh    Standing Status:   Future    Standing Expiration Date:   09/10/2018    Order Specific Question:   If indicated for the ordered procedure, I authorize the administration of a radiopharmaceutical per Radiology protocol    Answer:   Yes    Order Specific Question:   Preferred imaging location?    Answer:   [facility]    Order Specific Question:   Radiology Contrast Protocol - do NOT remove file path    Answer:   [email or web address]    INTERVAL HISTORY: Please see below for problem oriented charting. He returns with his wife He has been feeling unwell He stopped taking insulin 2 weeks ago He felt that he did not need insulin due to weight loss and reduced oral intake He felt that his taste sensation has improved His oral intake has reduced and he has lost 5 pounds since the last time I saw him He felt frequent urination but denies dysuria, frequency or urgency He sleeps poorly Overall, he feels bad He felt that his treatment should be given more frequently He complained of intermittent chest wall pain but it is  not on the same side where he was discovered to have rib fractures He is not satisfied despite multiple explanation about his next future appointment.  Treatment should be given in 3 weeks.  However, I will not be present.  We discussed options including imaging study and seeing me before his appointment in 3 weeks or to schedule blood work, imaging study in 3 weeks and see me the  following week.  SUMMARY OF ONCOLOGIC HISTORY:   Tongue cancer ([place])   08/20/2015 Miscellaneous    He was evaluated by ENT      08/22/2015 Imaging    CT neck showed malignant adenopathy right supraclavicular region compatible with metastatic disease. Biopsy recommended. No definite pharyngeal mass lesion identified by CT.       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 No other primary or metastatic cancer. 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         11/13/2015 Procedure    Placement of 20 French pull-through percutaneous gastrostomy tube.      03/11/2016 PET scan    Resolution of hypermetabolic right cervical lymphadenopathy since prior study . No other hypermetabolic lymphadenopathy identified. New hypermetabolic lesion and caudate lobe of liver, highly suspicious for liver metastasis. Recommend liver protocol abdomen MRI without and with contrast for further evaluation. New patchy airspace disease in both lung apices with FDG uptake. 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 Recommend continued attention on follow-up imaging.       03/19/2016 Imaging    MRI showed 3.4 cm heterogeneously enhancing mass in caudate lobe of liver, consistent liver metastasis. No other sites of metastatic disease identified within the liver or abdomen      03/26/2016 Procedure    Ultrasound and fluoroscopically guided right internal jugular  single lumen power port catheter insertion. Tip in the SVC/RA junction. 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 Metabolic response to therapy of caudate lobe liver metastasis. Low-level activity in this area is similar to the surrounding liver.  2. Hypermetabolism in the right inguinal crease without concurrent adenopathy. Especially given subtle surrounding edema, favor cellulitis. Consider physical exam correlation. 3. Lack of visualization of previously described 7 mm left lower lobe pulmonary nodule. 4. Diffuse marrow hypermetabolism, favoring stimulation by chemotherapy.      09/20/2016 PET scan  1. Reduced activity in the hypermetabolic caudate lobe lesion of the liver, likely a metastatic lesion. 2. Scattered likely inflammatory activity along the glenohumeral and acromioclavicular joints and in the hips. 3. Healing of bilateral upper rib fractures. 4. Other imaging findings of potential clinical significance: Aortic Atherosclerosis (ICD10-I70.0). Coronary and carotid atherosclerosis. Photopenic right renal cysts.      11/17/2016 Procedure    Successful removal of 20 French pull-through gastrostomy tube.      12/09/2016 PET scan    1. Slight increase in size and degree of uptake associated with caudate lobe lesion consistent with metastatic disease 2. Increase in size and FDG uptake associated with portacaval lymph node 3. Aortic Atherosclerosis (ICD10-I70.0). Coronary atherosclerotic changes noted.      12/27/2016 -  Chemotherapy    He received carboplatin  and taxol      01/19/2017 - 01/25/2017 Hospital Admission    He was admitted to the hospital with sepsis secondary to cholangitis, managed conservatively      01/21/2017 Imaging    1. Cholelithiasis with gallbladder distension and subtle pericholecystic edema. Findings suspicious for acute cholecystitis. Right upper quadrant ultrasound could confirm. 2. Similar caudate lobe metastasis. Similar borderline porta hepatis adenopathy. 3. Left nephrolithiasis. 4. New tiny right pleural effusion with bibasilar atelectasis. 5. Trace air in the nondependent urinary bladder. Correlate with instrumentation. 6. Coronary artery atherosclerosis. Aortic Atherosclerosis (ICD10-I70.0).        03/25/2017 PET scan    1. Reduced size and significantly reduced activity in the hepatic caudate lobe metastatic lesion. Significantly reduced activity of the adjacent porta hepatis lymph node. Appearance compatible with interval effective therapy. 2. Ground-glass and sub solid densities anteriorly in the right upper lobe appear to conform to several secondary pulmonary lobules and are new compared to the right our prior exam. Low-grade metabolic activity at 2.7. Probably alveolitis/low-grade pneumonia, the morphology would be atypical for metastatic disease, but surveillance is suggested. 3. There is some focal accentuated metabolic activity at the sigmoid colon anastomotic site. No CT correlate. There is no abnormal activity in this vicinity on 12/09/2016 and I suspect that this is physiologic, but this area also warrant surveillance. 4. Other imaging findings of potential clinical significance: Aortic Atherosclerosis (ICD10-I70.0). Coronary atherosclerosis. Cholelithiasis potentially with minimal gallbladder wall thickening. Sigmoid colon diverticulosis. Prostatomegaly.      06/24/2017 PET scan    1. There has been interval increase in size and degree of FDG uptake associated with hypermetabolic metastasis to  segment 1 of the liver. 2. Mild increase in size and moderate increase in FDG uptake associated with upper abdominal nodal metastasis. 3. Interval development of diffuse uptake throughout the axial and appendicular skeleton which is favored to represent treatment related changes. 4. Aortic atherosclerosis and 3 vessel coronary artery atherosclerotic calcifications. 5. Gallstones.      07/08/2017 -  Chemotherapy    The patient had pembrolizumab for chemotherapy treatment.        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        Metastasis to  liver ([place])   03/22/2016 Initial Diagnosis    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([place])      07/07/2017 -  Chemotherapy    The patient had pembrolizumab (KEYTRUDA) 200 mg in sodium chloride 0.9 % 50 mL chemo infusion, 200 mg, Intravenous, Once, 4 of 6 cycles Administration: 200 mg (07/08/2017), 200 mg (07/29/2017), 200 mg (08/19/2017), 200 mg (09/09/2017)  for chemotherapy treatment.        REVIEW OF SYSTEMS:   Constitutional: Denies fevers, chills  Eyes: Denies blurriness of vision Ears, nose, mouth, throat, and face: Denies mucositis or sore throat Respiratory: Denies cough, dyspnea or wheezes Cardiovascular: Denies palpitation, chest discomfort or lower extremity swelling Gastrointestinal:  Denies nausea, heartburn or change in bowel habits Skin: Denies abnormal skin rashes Lymphatics: Denies new lymphadenopathy or easy bruising Neurological:Denies numbness, tingling or new weaknesses Behavioral/Psych: Mood is stable, no new changes  All other systems  were reviewed with the patient and are negative.  I have reviewed the past medical history, past surgical history, social history and family history with the patient and they are unchanged from previous note.  ALLERGIES:  is allergic to cetuximab.  MEDICATIONS:  Current Outpatient Medications  Medication Sig Dispense Refill  . clopidogrel (PLAVIX) 75 MG tablet TAKE 1 TABLET EVERY DAY 90 tablet 2  . insulin glargine (LANTUS) 100 UNIT/ML injection Inject 0.5 mLs (50 Units total) into the skin at bedtime. 10 mL   . levothyroxine (SYNTHROID, LEVOTHROID) 25 MCG tablet Take 1 tablet (25 mcg total) by mouth daily before breakfast. 90 tablet 3  . lidocaine-prilocaine (EMLA) cream Apply 1 application topically as needed. 30 g 6  . Multiple Vitamin (MULTIVITAMIN WITH MINERALS) TABS tablet Take 1 tablet by mouth daily. 30 tablet 0  . ondansetron (ZOFRAN) 8 MG tablet Take 1 tablet (8 mg total) by mouth every 8 (eight) hours as needed for nausea or vomiting. 60 tablet 11   No current facility-administered medications for this visit.     PHYSICAL EXAMINATION: ECOG PERFORMANCE STATUS: 2 - Symptomatic, <50% confined to bed  Vitals:   09/09/17 0853  BP: (!) 118/54  Pulse: (!) 50  Resp: 18  Temp: 98.4 F (36.9 C)  SpO2: 100%   Filed Weights   09/09/17 0853  Weight: 154 lb 9.6 oz (70.1 kg)    GENERAL:alert, no distress and comfortable SKIN: skin color, texture, turgor are normal, no rashes or significant lesions EYES: normal, Conjunctiva are pink and non-injected, sclera clear OROPHARYNX:no exudate, no erythema and lips, buccal mucosa, and tongue normal  NECK: supple, thyroid normal size, non-tender, without nodularity LYMPH:  no palpable lymphadenopathy in the cervical, axillary or inguinal LUNGS: clear to auscultation and percussion with normal breathing effort HEART: regular rate & rhythm and no murmurs and no lower extremity edema ABDOMEN:abdomen soft, non-tender and normal bowel  sounds Musculoskeletal:no cyanosis of digits and no clubbing  NEURO: alert & oriented x 3 with fluent speech, no focal motor/sensory deficits  LABORATORY DATA:  I have reviewed the data as listed    Component Value Date/Time   NA 139 09/09/2017 0756   NA 140 02/07/2017 0811   K 3.4 (L) 09/09/2017 0756   K 3.9 02/07/2017 0811   CL 103 09/09/2017 0756   CO2 25 09/09/2017 0756   CO2 25 02/07/2017 0811   GLUCOSE 419 (H) 09/09/2017 0756   GLUCOSE 118 02/07/2017 0811   BUN 17 09/09/2017 0756   BUN 17.8 02/07/2017 0811   CREATININE 0.98 09/09/2017 0756   CREATININE 1.0  02/07/2017 0811   CALCIUM 9.5 09/09/2017 0756   CALCIUM 9.4 02/07/2017 0811   PROT 6.6 09/09/2017 0756   PROT 6.7 02/07/2017 0811   ALBUMIN 3.7 09/09/2017 0756   ALBUMIN 3.2 (L) 02/07/2017 0811   AST 30 09/09/2017 0756   AST 16 02/07/2017 0811   ALT 30 09/09/2017 0756   ALT 17 02/07/2017 0811   ALKPHOS 93 09/09/2017 0756   ALKPHOS 144 02/07/2017 0811   BILITOT 0.7 09/09/2017 0756   BILITOT 0.70 02/07/2017 0811   GFRNONAA >60 09/09/2017 0756   GFRAA >60 09/09/2017 0756    No results found for: SPEP, UPEP  Lab Results  Component Value Date   WBC 5.7 09/09/2017   NEUTROABS 4.8 09/09/2017   HGB 13.6 09/09/2017   HCT 40.3 09/09/2017   MCV 90.1 09/09/2017   PLT 111 (L) 09/09/2017      Chemistry      Component Value Date/Time   NA 139 09/09/2017 0756   NA 140 02/07/2017 0811   K 3.4 (L) 09/09/2017 0756   K 3.9 02/07/2017 0811   CL 103 09/09/2017 0756   CO2 25 09/09/2017 0756   CO2 25 02/07/2017 0811   BUN 17 09/09/2017 0756   BUN 17.8 02/07/2017 0811   CREATININE 0.98 09/09/2017 0756   CREATININE 1.0 02/07/2017 0811   GLU 78 11/13/2015 1134      Component Value Date/Time   CALCIUM 9.5 09/09/2017 0756   CALCIUM 9.4 02/07/2017 0811   ALKPHOS 93 09/09/2017 0756   ALKPHOS 144 02/07/2017 0811   AST 30 09/09/2017 0756   AST 16 02/07/2017 0811   ALT 30 09/09/2017 0756   ALT 17 02/07/2017 0811    BILITOT 0.7 09/09/2017 0756   BILITOT 0.70 02/07/2017 0811      All questions were answered. The patient knows to call the clinic with any problems, questions or concerns. No barriers to learning was detected.  I spent 30 minutes counseling the patient face to face. The total time spent in the appointment was 40 minutes and more than 50% was on counseling and review of test results  Heath Lark, MD 09/09/2017 2:41 PM

## 2017-09-09 NOTE — Assessment & Plan Note (Signed)
We will continue treatment indefinitely His liver function tests are within normal limits

## 2017-09-30 ENCOUNTER — Inpatient Hospital Stay: Payer: Medicare Other

## 2017-09-30 ENCOUNTER — Ambulatory Visit (HOSPITAL_COMMUNITY)
Admission: RE | Admit: 2017-09-30 | Discharge: 2017-09-30 | Disposition: A | Discharge status: Home / Self Care | Payer: Medicare Other | Source: Ambulatory Visit | Attending: Hematology and Oncology | Admitting: Hematology and Oncology

## 2017-09-30 DIAGNOSIS — C01 Malignant neoplasm of base of tongue: Secondary | ICD-10-CM

## 2017-09-30 DIAGNOSIS — I700 Atherosclerosis of aorta: Secondary | ICD-10-CM | POA: Diagnosis not present

## 2017-09-30 DIAGNOSIS — Z79899 Other long term (current) drug therapy: Secondary | ICD-10-CM | POA: Diagnosis not present

## 2017-09-30 DIAGNOSIS — R590 Localized enlarged lymph nodes: Secondary | ICD-10-CM | POA: Insufficient documentation

## 2017-09-30 DIAGNOSIS — R110 Nausea: Secondary | ICD-10-CM

## 2017-09-30 DIAGNOSIS — C029 Malignant neoplasm of tongue, unspecified: Secondary | ICD-10-CM

## 2017-09-30 DIAGNOSIS — C787 Secondary malignant neoplasm of liver and intrahepatic bile duct: Secondary | ICD-10-CM | POA: Diagnosis not present

## 2017-09-30 DIAGNOSIS — K8020 Calculus of gallbladder without cholecystitis without obstruction: Secondary | ICD-10-CM | POA: Diagnosis not present

## 2017-09-30 DIAGNOSIS — Z5111 Encounter for antineoplastic chemotherapy: Secondary | ICD-10-CM

## 2017-09-30 DIAGNOSIS — Z5112 Encounter for antineoplastic immunotherapy: Secondary | ICD-10-CM | POA: Diagnosis not present

## 2017-09-30 LAB — COMPREHENSIVE METABOLIC PANEL
ALT: 15 U/L (ref 0–44)
AST: 18 U/L (ref 15–41)
Albumin: 3.7 g/dL (ref 3.5–5.0)
Alkaline Phosphatase: 104 U/L (ref 38–126)
Anion gap: 9 (ref 5–15)
BILIRUBIN TOTAL: 0.6 mg/dL (ref 0.3–1.2)
BUN: 8 mg/dL (ref 8–23)
CHLORIDE: 103 mmol/L (ref 98–111)
CO2: 28 mmol/L (ref 22–32)
CREATININE: 0.77 mg/dL (ref 0.61–1.24)
Calcium: 9.8 mg/dL (ref 8.9–10.3)
GFR calc Af Amer: >60 mL/min (ref >60)
Glucose, Bld: 156 mg/dL — ABNORMAL HIGH (ref 70–99)
Potassium: 3.4 mmol/L — ABNORMAL LOW (ref 3.5–5.1)
Sodium: 140 mmol/L (ref 135–145)
TOTAL PROTEIN: 6.9 g/dL (ref 6.5–8.1)

## 2017-09-30 LAB — CBC WITH DIFFERENTIAL/PLATELET
Basophils Absolute: 0 10*3/uL (ref 0.0–0.1)
Basophils Relative: 0 %
EOS ABS: 0.1 10*3/uL (ref 0.0–0.5)
EOS PCT: 1 %
HCT: 40.1 % (ref 38.4–49.9)
Hemoglobin: 13.5 g/dL (ref 13.0–17.1)
LYMPHS ABS: 0.5 10*3/uL — AB (ref 0.9–3.3)
Lymphocytes Relative: 10 %
MCH: 29.6 pg (ref 27.2–33.4)
MCHC: 33.6 g/dL (ref 32.0–36.0)
MCV: 88.1 fL (ref 79.3–98.0)
Monocytes Absolute: 0.3 10*3/uL (ref 0.1–0.9)
Monocytes Relative: 6 %
Neutro Abs: 4.3 10*3/uL (ref 1.5–6.5)
Neutrophils Relative %: 83 %
PLATELETS: 135 10*3/uL — AB (ref 140–400)
RBC: 4.55 MIL/uL (ref 4.20–5.82)
RDW: 12.6 % (ref 11.0–14.6)
WBC: 5.3 10*3/uL (ref 4.0–10.3)

## 2017-09-30 LAB — TSH: TSH: 5.719 u[IU]/mL — ABNORMAL HIGH (ref 0.320–4.118)

## 2017-09-30 LAB — GLUCOSE, CAPILLARY: GLUCOSE-CAPILLARY: 175 mg/dL — AB (ref 70–99)

## 2017-09-30 MED ORDER — HEPARIN SOD (PORK) LOCK FLUSH 100 UNIT/ML IV SOLN
500.0000 [IU] | Freq: Once | INTRAVENOUS | Status: AC
  Administered 2017-09-30: 500 [IU]
  Filled 2017-09-30: qty 5

## 2017-09-30 MED ORDER — FLUDEOXYGLUCOSE F - 18 (FDG) INJECTION
7.6000 | Freq: Once | INTRAVENOUS | Status: AC | PRN
  Administered 2017-09-30: 7.6 via INTRAVENOUS

## 2017-09-30 MED ORDER — SODIUM CHLORIDE 0.9% FLUSH
10.0000 mL | Freq: Once | INTRAVENOUS | Status: AC
  Administered 2017-09-30: 10 mL
  Filled 2017-09-30: qty 10

## 2017-09-30 NOTE — Patient Instructions (Signed)

## 2017-10-03 ENCOUNTER — Inpatient Hospital Stay (HOSPITAL_BASED_OUTPATIENT_CLINIC_OR_DEPARTMENT_OTHER): Payer: Medicare Other | Admitting: Hematology and Oncology

## 2017-10-03 ENCOUNTER — Inpatient Hospital Stay: Payer: Medicare Other

## 2017-10-03 DIAGNOSIS — C01 Malignant neoplasm of base of tongue: Secondary | ICD-10-CM

## 2017-10-03 DIAGNOSIS — Z7189 Other specified counseling: Secondary | ICD-10-CM

## 2017-10-03 DIAGNOSIS — C787 Secondary malignant neoplasm of liver and intrahepatic bile duct: Secondary | ICD-10-CM

## 2017-10-03 DIAGNOSIS — C770 Secondary and unspecified malignant neoplasm of lymph nodes of head, face and neck: Secondary | ICD-10-CM | POA: Diagnosis not present

## 2017-10-03 DIAGNOSIS — G893 Neoplasm related pain (acute) (chronic): Secondary | ICD-10-CM | POA: Diagnosis not present

## 2017-10-03 DIAGNOSIS — I700 Atherosclerosis of aorta: Secondary | ICD-10-CM

## 2017-10-03 DIAGNOSIS — Z5112 Encounter for antineoplastic immunotherapy: Secondary | ICD-10-CM | POA: Diagnosis not present

## 2017-10-03 DIAGNOSIS — Z923 Personal history of irradiation: Secondary | ICD-10-CM

## 2017-10-03 DIAGNOSIS — C029 Malignant neoplasm of tongue, unspecified: Secondary | ICD-10-CM

## 2017-10-03 MED ORDER — HYDROMORPHONE HCL 4 MG PO TABS: 4.0000 mg | ORAL_TABLET | ORAL | 0 refills | Status: AC | PRN

## 2017-10-04 ENCOUNTER — Telehealth: Payer: Self-pay

## 2017-10-04 ENCOUNTER — Encounter: Payer: Self-pay | Admitting: Hematology and Oncology

## 2017-10-04 NOTE — Assessment & Plan Note (Signed)
He has new cancer associated pain I recommend narcotic prescription to control pain I warned him about risk of nausea and constipation

## 2017-10-04 NOTE — Telephone Encounter (Signed)
Benjamin Barr and given referral. They will contact the patient and wife.

## 2017-10-04 NOTE — Telephone Encounter (Signed)
Benjamin Barr with [facility] called. Patient family prefers [facility].

## 2017-10-04 NOTE — Progress Notes (Signed)
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, the patient has signs of cancer progression and he is symptomatic from back pain The patient is refractory to multiple lines of treatment Any future treatment response rate is expected to be less than 20% The patient has other major comorbidities We discussed the risk, benefits, side effects of various different treatment options At the time of evaluation, the patient was undecided but at the time of dictation, the patient has made informed decision to be enrolled in palliative care and hospice  Goals of care, counseling/discussion 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 We discussed importance of Advanced Directives and Living will. We discussed prognosis with or without chemotherapy Ultimately, the patient has made informed decision to be enrolled in palliative care/hospice   Cancer associated pain He has new cancer associated pain I recommend narcotic prescription to control pain I warned him about risk of nausea and constipation   No orders of the defined types were placed in this encounter.   INTERVAL HISTORY: Please see below for problem oriented charting. He returns with his wife for further follow-up He has started to complain of intermittent back pain over the past week His appetite is stable He denies nausea or or changes in bowel habits  SUMMARY OF ONCOLOGIC HISTORY:   Tongue cancer ([place])    08/20/2015 Miscellaneous    He was evaluated by ENT    08/22/2015 Imaging    CT neck showed malignant adenopathy right supraclavicular region compatible with metastatic disease. Biopsy recommended. No definite pharyngeal mass lesion identified by CT.     08/26/2015 Procedure    He has FNA of right neck LN    08/26/2015 Pathology Results    FNA is positive for squamous cell carcinoma    08/29/2015 PET scan    PET scan showed 2.1 x 4 cm, SUV 8.1. No other primary or metastatic cancer. 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       11/13/2015 Procedure    Placement of 20 French pull-through percutaneous gastrostomy tube.    03/11/2016 PET scan    Resolution of hypermetabolic right cervical lymphadenopathy since prior study . No other hypermetabolic lymphadenopathy identified. New hypermetabolic lesion and caudate lobe of liver, highly suspicious for liver metastasis. Recommend liver protocol abdomen MRI without and with contrast for further evaluation. New patchy airspace disease in both lung apices with FDG uptake. 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 Recommend continued attention on follow-up imaging.     03/19/2016 Imaging    MRI showed 3.4 cm heterogeneously enhancing mass in caudate lobe of liver, consistent liver metastasis. No other sites of metastatic disease identified within the liver  or abdomen    03/26/2016 Procedure    Ultrasound and fluoroscopically guided right internal jugular single lumen power port catheter insertion. Tip in the SVC/RA junction. 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 Metabolic response to therapy of caudate lobe liver metastasis. Low-level activity in this area is similar to the surrounding liver.  2. Hypermetabolism in the right inguinal crease without concurrent adenopathy. Especially given subtle surrounding edema, favor cellulitis. Consider physical exam correlation. 3. Lack of visualization of previously described 7 mm left lower lobe pulmonary nodule. 4. Diffuse marrow hypermetabolism, favoring stimulation by chemotherapy.    09/20/2016 PET scan    1. Reduced activity in the hypermetabolic caudate lobe lesion of the liver, likely a metastatic lesion. 2. Scattered likely inflammatory activity along the glenohumeral and acromioclavicular joints and in the hips. 3. Healing of bilateral upper rib fractures. 4. Other imaging findings of potential clinical significance: Aortic Atherosclerosis (ICD10-I70.0). Coronary and carotid atherosclerosis. Photopenic right renal cysts.    11/17/2016 Procedure    Successful removal of 20 French pull-through gastrostomy tube.    12/09/2016 PET scan    1. Slight increase in size and degree of uptake associated with caudate lobe lesion consistent with metastatic disease 2. Increase in size and FDG uptake associated with portacaval lymph node 3. Aortic Atherosclerosis (ICD10-I70.0). Coronary atherosclerotic changes noted.    12/27/2016 - 06/20/2017 Chemotherapy    He received carboplatin and taxol    01/19/2017 - 01/25/2017 Hospital Admission    He was admitted to the hospital with sepsis secondary to  cholangitis, managed conservatively    01/21/2017 Imaging    1. Cholelithiasis with gallbladder distension and subtle pericholecystic edema. Findings suspicious for acute cholecystitis. Right upper quadrant ultrasound could confirm. 2. Similar caudate lobe metastasis. Similar borderline porta hepatis adenopathy. 3. Left nephrolithiasis. 4. New tiny right pleural effusion with bibasilar atelectasis. 5. Trace air in the nondependent urinary bladder. Correlate with instrumentation. 6. Coronary artery atherosclerosis. Aortic Atherosclerosis (ICD10-I70.0).      03/25/2017 PET scan    1. Reduced size and significantly reduced activity in the hepatic caudate lobe metastatic lesion. Significantly reduced activity of the adjacent porta hepatis lymph node. Appearance compatible with interval effective therapy. 2. Ground-glass and sub solid densities anteriorly in the right upper lobe appear to conform to several secondary pulmonary lobules and are new compared to the right our prior exam. Low-grade metabolic activity at 2.7. Probably alveolitis/low-grade pneumonia, the morphology would be atypical for metastatic disease, but surveillance is suggested. 3. There is some focal accentuated metabolic activity at the sigmoid colon anastomotic site. No CT correlate. There is no abnormal activity in this vicinity on 12/09/2016 and I suspect that this is physiologic, but this area also warrant surveillance. 4. Other imaging findings of potential clinical significance: Aortic Atherosclerosis (ICD10-I70.0). Coronary atherosclerosis. Cholelithiasis potentially with minimal gallbladder wall thickening. Sigmoid colon diverticulosis. Prostatomegaly.    06/24/2017 PET scan    1. There has been interval increase in size and degree of FDG uptake associated with hypermetabolic metastasis to segment 1 of  the liver. 2. Mild increase in size and moderate increase in FDG uptake associated with upper abdominal nodal  metastasis. 3. Interval development of diffuse uptake throughout the axial and appendicular skeleton which is favored to represent treatment related changes. 4. Aortic atherosclerosis and 3 vessel coronary artery atherosclerotic calcifications. 5. Gallstones.    07/08/2017 - 09/09/2017 Chemotherapy    The patient had pembrolizumab for chemotherapy treatment.     09/30/2017 PET scan    1. Continued further progression in size of the hypermetabolic caudate lobe lesion (segment I). New tiny hypermetabolic focus inferior parenchyma of the lateral segment left liver (segment III) without underlying CT correlate. Nonspecific but attention in this region on follow-up recommended. 2. Interval progression of upper abdominal lymphadenopathy in the hepato duodenal ligament and retroperitoneum. Enlarging nodes and nodes with new hypermetabolism identified on today's exam. 3. Interval decrease in relatively diffuse FDG accumulation within the marrow space. 4. Aortic Atherosclerois (ICD10-170.0) 5. Cholelithiasis.     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([place])   03/22/2016 Initial Diagnosis    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([place])    07/07/2017 -  Chemotherapy    The patient had pembrolizumab (KEYTRUDA) 200 mg in sodium chloride 0.9 % 50 mL chemo infusion, 200 mg, Intravenous, Once, 4 of 6 cycles Administration: 200 mg (07/08/2017), 200 mg (07/29/2017), 200 mg (08/19/2017), 200 mg (09/09/2017)  for chemotherapy treatment.      REVIEW OF SYSTEMS:   Constitutional: Denies fevers, chills  or abnormal weight loss Eyes: Denies blurriness of vision Ears, nose, mouth, throat, and face: Denies mucositis or sore throat Respiratory: Denies cough, dyspnea or wheezes Cardiovascular: Denies palpitation, chest discomfort or lower extremity swelling Gastrointestinal:  Denies nausea, heartburn or change in bowel habits Skin: Denies abnormal skin rashes Lymphatics: Denies new lymphadenopathy or easy bruising Neurological:Denies numbness, tingling or new weaknesses Behavioral/Psych: Mood is stable, no new changes  All other systems were reviewed with the patient and are negative.  I have reviewed the past medical history, past surgical history, social history and family history with the patient and they are unchanged from previous note.  ALLERGIES:  is allergic to cetuximab.  MEDICATIONS:  Current Outpatient Medications  Medication Sig Dispense Refill  . clopidogrel (PLAVIX) 75 MG tablet TAKE 1 TABLET EVERY DAY 90 tablet 2  . HYDROmorphone (DILAUDID) 4 MG tablet Take 1 tablet (4 mg total) by mouth every 4 (four) hours as needed for severe pain. 30 tablet 0  . insulin glargine (LANTUS) 100 UNIT/ML injection Inject 0.5 mLs (50 Units total) into the skin at bedtime. 10 mL   . levothyroxine (SYNTHROID, LEVOTHROID) 25 MCG tablet Take 1 tablet (25 mcg total) by mouth daily before breakfast. 90 tablet 3  . lidocaine-prilocaine (EMLA) cream Apply 1 application topically as needed. 30 g 6  . Multiple Vitamin (MULTIVITAMIN WITH MINERALS) TABS tablet Take 1 tablet by mouth daily. 30 tablet 0  . ondansetron (ZOFRAN) 8 MG tablet Take 1 tablet (8 mg total) by mouth every 8 (eight)  hours as needed for nausea or vomiting. 60 tablet 11   No current facility-administered medications for this visit.     PHYSICAL EXAMINATION: ECOG PERFORMANCE STATUS: 1 - Symptomatic but completely ambulatory  Vitals:   10/03/17 0849  BP: 136/66  Pulse: 71  Resp: 18  Temp: 98.4 F (36.9 C)  SpO2: 100%   Filed  Weights   10/03/17 0849  Weight: 157 lb 9.6 oz (71.5 kg)    GENERAL:alert, no distress and comfortable SKIN: skin color, texture, turgor are normal, no rashes or significant lesions Musculoskeletal:no cyanosis of digits and no clubbing  NEURO: alert & oriented x 3 with fluent speech, no focal motor/sensory deficits  LABORATORY DATA:  I have reviewed the data as listed    Component Value Date/Time   NA 140 09/30/2017 0749   NA 140 02/07/2017 0811   K 3.4 (L) 09/30/2017 0749   K 3.9 02/07/2017 0811   CL 103 09/30/2017 0749   CO2 28 09/30/2017 0749   CO2 25 02/07/2017 0811   GLUCOSE 156 (H) 09/30/2017 0749   GLUCOSE 118 02/07/2017 0811   BUN 8 09/30/2017 0749   BUN 17.8 02/07/2017 0811   CREATININE 0.77 09/30/2017 0749   CREATININE 1.0 02/07/2017 0811   CALCIUM 9.8 09/30/2017 0749   CALCIUM 9.4 02/07/2017 0811   PROT 6.9 09/30/2017 0749   PROT 6.7 02/07/2017 0811   ALBUMIN 3.7 09/30/2017 0749   ALBUMIN 3.2 (L) 02/07/2017 0811   AST 18 09/30/2017 0749   AST 16 02/07/2017 0811   ALT 15 09/30/2017 0749   ALT 17 02/07/2017 0811   ALKPHOS 104 09/30/2017 0749   ALKPHOS 144 02/07/2017 0811   BILITOT 0.6 09/30/2017 0749   BILITOT 0.70 02/07/2017 0811   GFRNONAA >60 09/30/2017 0749   GFRAA >60 09/30/2017 0749    No results found for: SPEP, UPEP  Lab Results  Component Value Date   WBC 5.3 09/30/2017   NEUTROABS 4.3 09/30/2017   HGB 13.5 09/30/2017   HCT 40.1 09/30/2017   MCV 88.1 09/30/2017   PLT 135 (L) 09/30/2017      Chemistry      Component Value Date/Time   NA 140 09/30/2017 0749   NA 140 02/07/2017 0811   K 3.4 (L) 09/30/2017 0749   K 3.9 02/07/2017 0811   CL 103 09/30/2017 0749   CO2 28 09/30/2017 0749   CO2 25 02/07/2017 0811   BUN 8 09/30/2017 0749   BUN 17.8 02/07/2017 0811   CREATININE 0.77 09/30/2017 0749   CREATININE 1.0 02/07/2017 0811   GLU 78 11/13/2015 1134      Component Value Date/Time   CALCIUM 9.8 09/30/2017 0749   CALCIUM 9.4  02/07/2017 0811   ALKPHOS 104 09/30/2017 0749   ALKPHOS 144 02/07/2017 0811   AST 18 09/30/2017 0749   AST 16 02/07/2017 0811   ALT 15 09/30/2017 0749   ALT 17 02/07/2017 0811   BILITOT 0.6 09/30/2017 0749   BILITOT 0.70 02/07/2017 0811       RADIOGRAPHIC STUDIES: I have reviewed multiple imaging study with the patient and his wife I have personally reviewed the radiological images as listed and agreed with the findings in the report. Nm Pet Image Restag (ps) Skull Base To Thigh  Result Date: 09/30/2017 CLINICAL DATA:  Subsequent treatment strategy for head and neck cancer. EXAM: NUCLEAR MEDICINE PET SKULL BASE TO THIGH TECHNIQUE: 7.7 mCi F-18 FDG was injected intravenously. Full-ring PET imaging was performed from the skull base to  thigh after the radiotracer. CT data was obtained and used for attenuation correction and anatomic localization. Fasting blood glucose: 171 mg/dl COMPARISON:  06/24/2017 FINDINGS: Mediastinal blood pool activity: SUV max 2.2 NECK: No hypermetabolic lymph nodes in the neck. Incidental CT findings: none CHEST: No hypermetabolic mediastinal or hilar nodes. No suspicious pulmonary nodules on the CT scan. Incidental CT findings: Right Port-A-Cath tip positioned at the SVC/RA junction.Airspace opacity posterior left costophrenic sulcus likely atelectatic. ABDOMEN/PELVIS: Interval progression of hypermetabolic caudate lesion, now measuring 4.8 x 6.3 cm. SUV max = 10.3 compared to 9.1 previously. Small hypermetabolic focus inferior lateral segment left liver demonstrates SUV max = 4. No underlying CT abnormality evident. Index portacaval node measured previously has progressed in the interval, measuring 2.0 cm today compared to 1.7 cm previously. This node remains hypermetabolic with SUV max = 7.6 compared 8 previously. More inferior and lateral is another portacaval node measuring 1.7 cm on today's study, progressed from 0.8 cm short axis previously. SUV max = 6.7. Progression  of aortocaval, pericaval, and para-aortic lymph nodes evident. Index para-aortic node measuring 1.4 cm short axis (4:120) on today's exam is increased from 0.5 cm previously. This node demonstrates SUV max = 5.5. Incidental CT findings: Layering tiny gallstones evident. Right upper pole renal cyst again noted. There is abdominal aortic atherosclerosis without aneurysm. Left colonic diverticulosis. SKELETON: Interval decrease in the relatively diffuse marrow uptake noted previously. Incidental CT findings: none IMPRESSION: 1. Continued further progression in size of the hypermetabolic caudate lobe lesion (segment I). New tiny hypermetabolic focus inferior parenchyma of the lateral segment left liver (segment III) without underlying CT correlate. Nonspecific but attention in this region on follow-up recommended. 2. Interval progression of upper abdominal lymphadenopathy in the hepato duodenal ligament and retroperitoneum. Enlarging nodes and nodes with new hypermetabolism identified on today's exam. 3. Interval decrease in relatively diffuse FDG accumulation within the marrow space. 4.  Aortic Atherosclerois (ICD10-170.0) 5. Cholelithiasis. Electronically Signed   By: Misty Stanley M.D.   On: 09/30/2017 11:37    All questions were answered. The patient knows to call the clinic with any problems, questions or concerns. No barriers to learning was detected.  I spent 40 minutes counseling the patient face to face. The total time spent in the appointment was 60 minutes and more than 50% was on counseling and review of test results  Heath Lark, MD 10/04/2017 1:16 PM

## 2017-10-04 NOTE — Telephone Encounter (Signed)
Wife called and left a message to call her.  Called back. They have decided to go with Hospice and Palliative care. Per wife, the referral can go to whatever agency Dr. Alvy Bimler prefers.

## 2017-10-04 NOTE — Assessment & Plan Note (Signed)
Unfortunately, the patient has signs of cancer progression and he is symptomatic from back pain The patient is refractory to multiple lines of treatment Any future treatment response rate is expected to be less than 20% The patient has other major comorbidities We discussed the risk, benefits, side effects of various different treatment options At the time of evaluation, the patient was undecided but at the time of dictation, the patient has made informed decision to be enrolled in palliative care and hospice

## 2017-10-04 NOTE — Telephone Encounter (Signed)
Called referral to Hospice and [place].

## 2017-10-04 NOTE — Assessment & Plan Note (Signed)
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 We discussed importance of Advanced Directives and Living will. We discussed prognosis with or without chemotherapy Ultimately, the patient has made informed decision to be enrolled in palliative care/hospice

## 2017-10-04 NOTE — Telephone Encounter (Signed)
Pls consult [facility]

## 2017-10-07 NOTE — Progress Notes (Signed)
Cardiology Office Note   Date:  10/11/2017   ID:  Benjamin, Barr 1945-03-05, MRN [id number]  PCP:  Aura Dials, MD  Cardiologist:  Dr. Breta Demedeiros [name]     Chief Complaint  Patient presents with  . Coronary Artery Disease      History of Present Illness: Benjamin Barr is a 72 y.o. male who presents for follow up CAD.  He has w/ PMH of CAD w/ Taxus DES to LCx & RCA in 2008, rheumatoid arthritis (& new Dx of PMR), type II DM, prostate CA.He presented in October 2016 with progressive angina.  He had stenting of the ostial LCx, with DES, stenting of mRCA with DES and POBA of the PDA. The prior stents were still patent.   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 He received RT and a gastroscopy feeding tube. In February scans revealed new hepatic metastases. A PortAcath was placed.  He was started on chemotherapy with cetuximab but suffered a cardiac arrest related to an anaphylactic reaction to chemo. During arrest he suffered multiple rib fractures due to CPR. He had transient Afib (after receiving IV epinephrine) that converted to NSR. He was started on low dose metoprolol. Discharged 04/06/26.   On his follow up with oncology this August it was noted that PET scan showed progressive metastatic disease in the  liver and abdominal lymph nodes.  Referred for palliative an hospice care.   On his last visit metoprolol was stopped due to bradycardia.  On follow up today he is seen with his wife. Discussed his recent cancer findings. Now on Hospice and appreciates what they offer. He really hasn't had any cardiac problems. Does note he is weaker and appetite is poor.   Past Medical History:  Diagnosis Date  . Anemia   . CAD (coronary artery disease)    a. 2008 PCI: Taxus DES  to mRCA, prox & mid LCFX;  b. 10/2014 MV: extensive ischemia in the RCA territory and small area of anterior  ischemia;  c. 11/2014 PCI: LM nl, LAD 30ost/prox, 80d, LCX 90ost (3.0x12 Promus Prem DES), OM2 30, RCA 70m (3.0x12 Promus Prem DES), 58m, RPDA 90 (PTCA), EF 55-65%.  . Cancer ([place])    Head & neck  . Diverticulosis of colon   . History of colon polyps    2014--  hyperplastic, adenoma, and benign polyps  . History of radiation therapy 10/01/15- 11/20/15   Oropharynx/base of neck 70 Gy in 35 fractions  . Hyperlipidemia   . Hypertension   . Myocardial infarction ([place])   . Nonproliferative diabetic retinopathy ([place])   . Obesity   . Peripheral neuropathy   . Pneumonia    hx  . Prostate cancer ([place])   . RBBB (right bundle branch block)   . Skin cancer   . Type 2 diabetes mellitus ([place])     Past Surgical History:  Procedure Laterality Date  . APPENDECTOMY    . CARDIAC CATHETERIZATION N/A 11/13/2014   Procedure: Left Heart Cath and Coronary Angiography;  Surgeon: Tyreese Thain M [name], MD;  Location: [facility];  Service: Cardiovascular;  Laterality: N/A;  . CARDIAC CATHETERIZATION  11/13/2014   Procedure: Coronary Stent Intervention;  Surgeon: Shawna Kiener M [name], MD;  Location: [facility];  Service: Cardiovascular;;  . CARDIOVASCULAR STRESS TEST  01-17-2012  dr [name]   Normal perfusion study/ no ischemia or  infarction/  normal LVF and wall motion, ef 54%  . CORONARY ANGIOPLASTY WITH STENT PLACEMENT  05/04/2006   dr [name]   Severe 2-vessel obstructive CAD/  normal LVF/  PCI with DES to proxima and mid CFX and mRCA (total 3 DES)  . CRYOABLATION N/A 04/01/2014   Procedure: CRYO ABLATION PROSTATE;  Surgeon: [name], MD;  Location: [facility];  Service: Urology;  Laterality: N/A;  . DIRECT LARYNGOSCOPY N/A 09/19/2015   Procedure: DIRECT LARYNGOSCOPY;  Surgeon: Izora Gala, MD;  Location: [facility];  Service: ENT;  Laterality: N/A;  . ESOPHAGOSCOPY N/A 09/19/2015   Procedure: ESOPHAGOSCOPY;  Surgeon: Izora Gala, MD;  Location: [facility];  Service: ENT;  Laterality: N/A;    . FLOOR OF MOUTH BIOPSY N/A 09/19/2015   Procedure: FLOOR OF MOUTH BIOPSY;  Surgeon: Izora Gala, MD;  Location: [facility];  Service: ENT;  Laterality: N/A;  . INSERTION OF SUPRAPUBIC CATHETER N/A 04/01/2014   Procedure: INSERTION OF SUPRAPUBIC CATHETER;  Surgeon: [name], MD;  Location: [facility];  Service: Urology;  Laterality: N/A;  suprapubic   . IR GASTROSTOMY TUBE REMOVAL  11/17/2016  . IR GENERIC HISTORICAL  11/13/2015   IR GASTROSTOMY TUBE MOD SED 11/13/2015 [name], DO [facility]  . IR GENERIC HISTORICAL  03/26/2016   IR US GUIDE VASC ACCESS RIGHT 03/26/2016 [name], MD [facility]  . IR GENERIC HISTORICAL  03/26/2016   IR FLUORO GUIDE PORT INSERTION RIGHT 03/26/2016 [name], MD [facility]  . MULTIPLE TOOTH EXTRACTIONS  09/17/2015  . PARTIAL COLECTOMY  80's     Current Outpatient Medications  Medication Sig Dispense Refill  . clopidogrel (PLAVIX) 75 MG tablet TAKE 1 TABLET EVERY DAY 90 tablet 2  . HYDROmorphone (DILAUDID) 4 MG tablet Take 1 tablet (4 mg total) by mouth every 4 (four) hours as needed for severe pain. 30 tablet 0  . insulin glargine (LANTUS) 100 UNIT/ML injection Inject 0.5 mLs (50 Units total) into the skin at bedtime. 10 mL   . levothyroxine (SYNTHROID, LEVOTHROID) 25 MCG tablet Take 1 tablet (25 mcg total) by mouth daily before breakfast. 90 tablet 3  . lidocaine-prilocaine (EMLA) cream Apply 1 application topically as needed. 30 g 6  . morphine (MSIR) 15 MG tablet Take 1 tablet (15 mg total) by mouth every 4 (four) hours as needed for severe pain. 60 tablet 0  . Multiple Vitamin (MULTIVITAMIN WITH MINERALS) TABS tablet Take 1 tablet by mouth daily. 30 tablet 0  . ondansetron (ZOFRAN) 8 MG tablet Take 1 tablet (8 mg total) by mouth every 8 (eight) hours as needed for nausea or vomiting. 60 tablet 11  . senna (SENOKOT) 8.6 MG TABS tablet Take 2 tablets (17.2 mg total) by mouth 3 (three) times daily. 120 each 0   No  current facility-administered medications for this visit.     Allergies:   Cetuximab    Social History:  The patient  reports that he has never smoked. He has never used smokeless tobacco. He reports that he drinks alcohol. He reports that he does not use drugs.   Family History:  The patient's family history includes Cancer in his father; Diverticulitis in his mother; Heart failure in his father.    ROS:  As noted in HPI. All other systems are reviewed and are negative.  Wt Readings from Last 3 Encounters:  10/11/17 154 lb (69.9 kg)  10/03/17 157 lb 9.6 oz (71.5 kg)  09/09/17 154  lb 9.6 oz (70.1 kg)     PHYSICAL EXAM: VS:  BP 116/62 (BP Location: Left Arm, Patient Position: Sitting, Cuff Size: Normal)   Pulse 81   Ht 5\' 8"  (1.727 m)   Wt 154 lb (69.9 kg)   BMI 23.42 kg/m  , BMI Body mass index is 23.42 kg/m. GENERAL:  Chronically ill appearing, thin  WM in NAD HEENT:  PERRL, EOMI, sclera are clear. Oropharynx is clear. NECK:  No jugular venous distention, carotid upstroke brisk and symmetric, no bruits, no thyromegaly or adenopathy LUNGS:  Clear to auscultation bilaterally CHEST:  Unremarkable HEART:  RRR,  PMI not displaced or sustained,S1 and S2 within normal limits, no S3, no S4: no clicks, no rubs, no murmurs ABD:  Soft, nontender. BS +, no masses or bruits. No hepatomegaly, no splenomegaly EXT:  2 + pulses throughout, no edema, no cyanosis no clubbing SKIN:  Warm and dry.  No rashes NEURO:  Alert and oriented x 3. Cranial nerves II through XII intact. PSYCH:  Cognitively intact  Recent Labs: 01/24/2017: Magnesium 1.5 09/30/2017: ALT 15; BUN 8; Creatinine, Ser 0.77; Hemoglobin 13.5; Platelets 135; Potassium 3.4; Sodium 140; TSH 5.719  Dated 09/15/15: A1c 9.7% Dated 06/29/16: A1c 7.3%  Lipid Panel No results found for: CHOL, TRIG, HDL, CHOLHDL, VLDL, LDLCALC, LDLDIRECT   Lipid panel 07/26/14: cholesterol 146, triglycerides 398, LDL 34, HDL 32.  Dated 06/10/17:  cholesterol 143, triglycerides 351, HDL 25, LDL 48.   Other studies Reviewed: Additional studies/ records that were reviewed today include:   Cardiac cath October 2016: Procedures    Coronary Stent Intervention   Left Heart Cath and Coronary Angiography    Conclusion     Ost LAD to Prox LAD lesion, 30% stenosed.  Dist LAD lesion, 80% stenosed.  2nd Mrg lesion, 30% stenosed.  Ost Cx lesion, 90% stenosed.  The left ventricular systolic function is normal.  Ost Cx to Prox Cx lesion, 90% stenosed. This is proximal the the prior stent. There is a 0% residual stenosis post intervention.  RPDA lesion, 90% stenosed. There is a 10% residual stenosis post intervention.  Mid RCA-1 lesion, 90% stenosed. There is a 0% residual stenosis post intervention. A drug-eluting stent was placed. This is proximal to the prior stent.  Mid RCA-1 lesion, 90% stenosed.  A drug-eluting stent was placed.  A drug-eluting stent was placed.  1. Severe 2 vessel obstructive CAD  90% ostial LCx. Prior stent in the proximal to mid LCx is patent.  90% mid RCA proximal to prior stent. The old stent is patent  90% PDA 2. Normal LV function 3. Successful stenting of the ostial LCx with a DES 4. Successful stenting of the mid RCA with a DES  5. Successful POBA of the PDA.  Plan: continue DAPT indefinitely. Anticipate DC in am.     Echo: 05/22/14:Study Conclusions  - Left ventricle: The cavity size was normal. There was mild concentric hypertrophy. Systolic function was normal. The estimated ejection fraction was in the range of 60% to 65%. Wall motion was normal; there were no regional wall motion abnormalities. There was an increased relative contribution of atrial contraction to ventricular filling. Doppler parameters are consistent with abnormal left ventricular relaxation (grade 1 diastolic dysfunction). - Aortic valve: Moderate diffuse thickening and  calcification, consistent with sclerosis. - Mitral valve: Calcified annulus. Moderate focal calcification of the anterior leaflet. There was mild regurgitation. - Pericardium, extracardiac: A trivial pericardial effusion was identified posterior to the heart.  Echo  01/23/17: Study Conclusions  - Left ventricle: The cavity size was normal. Wall thickness was   normal. Basal inferior akinesis, basal inferolateral hypokinesis.   The estimated ejection fraction was 55%. Doppler parameters are   consistent with abnormal left ventricular relaxation (grade 1   diastolic dysfunction). - Aortic valve: Trileaflet; moderately calcified leaflets. There   was no stenosis. - Mitral valve: There was mild regurgitation. - Left atrium: The atrium was mildly dilated. - Right ventricle: The cavity size was normal. Systolic function   was normal. - Pulmonary arteries: No complete TR doppler jet so unable to   estimate PA systolic pressure. - Inferior vena cava: The vessel was normal in size. The   respirophasic diameter changes were in the normal range (>= 50%),   consistent with normal central venous pressure.  Impressions:  - Normal LV size with EF 55%. Basal inferior akinesis, basal   inferolateral severe hypokinesis. Normal RV size and systolic   function. Mild mitral regurgitation.  ASSESSMENT AND PLAN:  1.  CAD  s/p DES stents to ostial LCX, and mRCA and angioplasty to PDA in October 2016.  Recommend taking Plavix only for now.  He is off ASA. He is asymptomatic. If he were to develop bleeding problems I would stop Plavix.   2.   Hyperlipidemia- he is off statin now.   3.  Essential HTN  BP is well controlled.   4. DM followed by Dr. Buddy Duty -insulin dose reduced  5. 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 He has exhausted treatment options. On Hospice care   FU: PRN  Signed, Janara Klett [name], MD  10/11/2017 4:38 PM    [facility], [place], [place] Phone: [phone]; Fax: [phone]

## 2017-10-11 ENCOUNTER — Encounter: Payer: Self-pay | Admitting: Cardiology

## 2017-10-11 ENCOUNTER — Ambulatory Visit (INDEPENDENT_AMBULATORY_CARE_PROVIDER_SITE_OTHER): Admitting: Cardiology

## 2017-10-11 ENCOUNTER — Telehealth: Payer: Self-pay | Admitting: *Deleted

## 2017-10-11 ENCOUNTER — Other Ambulatory Visit: Payer: Self-pay | Admitting: Hematology and Oncology

## 2017-10-11 VITALS — BP 116/62 | HR 81 | Ht 68.0 in | Wt 154.0 lb

## 2017-10-11 DIAGNOSIS — I10 Essential (primary) hypertension: Secondary | ICD-10-CM

## 2017-10-11 DIAGNOSIS — I2510 Atherosclerotic heart disease of native coronary artery without angina pectoris: Secondary | ICD-10-CM

## 2017-10-11 DIAGNOSIS — E7800 Pure hypercholesterolemia, unspecified: Secondary | ICD-10-CM

## 2017-10-11 DIAGNOSIS — Z9861 Coronary angioplasty status: Secondary | ICD-10-CM

## 2017-10-11 MED ORDER — MORPHINE SULFATE 15 MG PO TABS: 15.0000 mg | ORAL_TABLET | ORAL | 0 refills | Status: DC | PRN

## 2017-10-11 MED ORDER — SENNA 8.6 MG PO TABS: 2.0000 | ORAL_TABLET | Freq: Three times a day (TID) | ORAL | 0 refills | Status: DC

## 2017-10-11 NOTE — Telephone Encounter (Signed)
I recommend continue Miralax daily plus sennokot 2 pills three times a day Do not stop miralax I recommend addition of treatment with sennokot

## 2017-10-11 NOTE — Telephone Encounter (Signed)
Called Thomes Dinning, RN back and notified him of message below.  Also advised of morphine refill.  He asked that for the sennokot to be called in to [facility] [address]  He also said that patient was impacted and that he was given and had a "decent sized" bm after he was disimpacted.

## 2017-10-11 NOTE — Telephone Encounter (Signed)
Thomes Dinning, RN from Hospice called to say Benjamin Barr does not want to start taking the dilaudid. He prefers to stay on the MSIR 15 mg every 4 hours as needed.  Needs a refill.  He has not had a BM since 8/28. Has been using miralax X 2 days. RN wants to know if you what him to try something else.

## 2017-10-14 ENCOUNTER — Telehealth: Payer: Self-pay | Admitting: *Deleted

## 2017-10-14 ENCOUNTER — Other Ambulatory Visit: Payer: Self-pay | Admitting: Hematology and Oncology

## 2017-10-14 MED ORDER — MORPHINE SULFATE 15 MG PO TABS: 15.0000 mg | ORAL_TABLET | ORAL | 0 refills | Status: DC | PRN

## 2017-10-14 NOTE — Telephone Encounter (Signed)
"  Sam calling in reference to Morphine [facility] [address].  They do not have this in stock.  A new order needs to be sent to [facility] [place].  They have two hundred pills in stock and the original [facility] cannot [facility]."

## 2017-10-14 NOTE — Telephone Encounter (Signed)
I have not set up the finger print yet

## 2017-10-14 NOTE — Telephone Encounter (Signed)
Benjamin Barr, I am printing a new prescription and signed it Will the pharmacist accept a faxed order?

## 2017-10-14 NOTE — Telephone Encounter (Signed)
No, they will not accept fax.  You can do the finger print or I can have pt pick up script.

## 2017-10-27 ENCOUNTER — Other Ambulatory Visit: Payer: Self-pay

## 2017-10-27 ENCOUNTER — Telehealth: Payer: Self-pay

## 2017-10-27 MED ORDER — MIRTAZAPINE 30 MG PO TABS: 30.0000 mg | ORAL_TABLET | Freq: Every day | ORAL | 3 refills | Status: DC

## 2017-10-27 NOTE — Telephone Encounter (Signed)
Nurse with [facility] called and left a message. Benjamin Barr needs a refill on Remeron. The Rx has expired and Benjamin Barr is taking it at bedtime again.  Rx sent to pharmacy.

## 2017-11-03 ENCOUNTER — Other Ambulatory Visit: Payer: Self-pay

## 2017-11-03 ENCOUNTER — Telehealth: Payer: Self-pay

## 2017-11-03 MED ORDER — FLUTICASONE PROPIONATE 50 MCG/ACT NA SUSP: 2.0000 | Freq: Every day | NASAL | 2 refills | Status: AC

## 2017-11-03 NOTE — Telephone Encounter (Signed)
Sam, nurse with [facility] [place] called and left a message. Mr. Steeves has a stuffy head and nose for x 1 week. Requesting Flonase Rx called into pharmacy.  Per Dr. Alvy Bimler Rx for [facility] sent to pharmacy.

## 2017-11-20 ENCOUNTER — Other Ambulatory Visit: Payer: Self-pay | Admitting: Hematology and Oncology

## 2017-12-05 ENCOUNTER — Telehealth: Payer: Self-pay

## 2017-12-05 NOTE — Telephone Encounter (Signed)
A verbal order was called to [place] and [facility] to start care for the next certification period of November 27 - February 24.

## 2017-12-05 NOTE — Telephone Encounter (Signed)
-----   Message from Heath Lark, MD sent at 12/05/2017  9:34 AM EDT ----- Regarding: RE: New Order pls proceed with a verbal order ok to start  ----- Message ----- From: Purcell Nails, RN Sent: 12/05/2017   9:16 AM EDT To: Flo Shanks, RN, Heath Lark, MD Subject: New Order                                      Hi Dr. Alvy Bimler- Thomes Dinning called from [facility] [place].  He is asking for a new order for  "start of care" for the next certification period (November 27 - February 24).

## 2017-12-09 ENCOUNTER — Telehealth: Payer: Self-pay

## 2017-12-09 NOTE — Telephone Encounter (Signed)
Sam, nurse with Hospice called and left a message requesting something for poor appetite and hard stools.  Called back and told Dr. Alvy Bimler is on medical leave. He will go to hospice physician with request.

## 2017-12-16 ENCOUNTER — Other Ambulatory Visit: Payer: Self-pay | Admitting: *Deleted

## 2017-12-16 ENCOUNTER — Telehealth: Payer: Self-pay | Admitting: *Deleted

## 2017-12-16 MED ORDER — SENNA 8.6 MG PO TABS: 2.0000 | ORAL_TABLET | Freq: Three times a day (TID) | ORAL | 2 refills | Status: AC

## 2017-12-16 NOTE — Telephone Encounter (Signed)
Received call from [place] requesting refill on pt's senna. Approval for refill given.

## 2017-12-19 ENCOUNTER — Other Ambulatory Visit: Payer: Self-pay | Admitting: Cardiology

## 2017-12-19 NOTE — Telephone Encounter (Signed)
Rx has been sent to the pharmacy electronically. ° °

## 2018-01-09 ENCOUNTER — Telehealth: Payer: Self-pay | Admitting: *Deleted

## 2018-01-09 NOTE — Telephone Encounter (Signed)
Received VM message from pt's hospice nurse with an update.  Pt is experiencing nausea and Sam Edison Pace has activated the patient's Hospice Care Orders, using Compazine 10 mg q 4 hours as needed.  Sam also states that pt is declining-has not had solid food in 1 week, though drinks Boost daily. HR 96, BP 136/86 weight 137.1 lbs

## 2018-01-12 ENCOUNTER — Other Ambulatory Visit: Payer: Self-pay | Admitting: Hematology and Oncology

## 2018-01-12 ENCOUNTER — Telehealth: Payer: Self-pay

## 2018-01-12 MED ORDER — MORPHINE SULFATE 15 MG PO TABS: 15.0000 mg | ORAL_TABLET | ORAL | 0 refills | Status: AC | PRN

## 2018-01-12 NOTE — Telephone Encounter (Signed)
Sam the nurse with hospice called and left a message. Requesting a refill on  Morphine 15 mg [facility] [address].

## 2018-01-12 NOTE — Telephone Encounter (Signed)
done

## 2018-01-26 ENCOUNTER — Telehealth: Payer: Self-pay | Admitting: *Deleted

## 2018-01-26 NOTE — Telephone Encounter (Signed)
Yes, OK for DNR 

## 2018-01-26 NOTE — Telephone Encounter (Signed)
Sam with Hospice called to obtain verbal order for DNR.   Patient has not had any oral intake in the past few days, increased weakness. Decreased output. Nurse states he is declining. Patient wishes to get to transferred to [facility]. Sam states that will probably not be for another week or two but they will transfer him when the time comes. Patient agrees with this plan.

## 2018-02-08 DEATH — deceased

## 2018-07-10 ENCOUNTER — Encounter: Payer: Self-pay | Admitting: Hematology and Oncology
